# Patient Record
Sex: Male | Born: 1995 | Race: White | Hispanic: No | Marital: Single | Smoking: Former smoker
Health system: Southern US, Community
[De-identification: ages and names within clinical notes are randomized; demographics above are authoritative.]

## PROBLEM LIST (undated history)

## (undated) ENCOUNTER — Emergency Department: Admission: EM | Payer: Medicaid Other

## (undated) DIAGNOSIS — M6282 Rhabdomyolysis: Secondary | ICD-10-CM

## (undated) DIAGNOSIS — I319 Disease of pericardium, unspecified: Secondary | ICD-10-CM

## (undated) DIAGNOSIS — N200 Calculus of kidney: Secondary | ICD-10-CM

---
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
## 2014-06-06 ENCOUNTER — Emergency Department: Admit: 2014-06-06 | Disposition: A | Discharge status: Home / Self Care | Payer: Self-pay | Admitting: Internal Medicine

## 2014-10-19 ENCOUNTER — Encounter: Payer: Self-pay | Admitting: Emergency Medicine

## 2014-10-19 ENCOUNTER — Emergency Department
Admission: EM | Admit: 2014-10-19 | Discharge: 2014-10-19 | Disposition: A | Discharge status: Home / Self Care | Payer: Self-pay | Attending: Emergency Medicine | Admitting: Emergency Medicine

## 2014-10-19 DIAGNOSIS — J029 Acute pharyngitis, unspecified: Secondary | ICD-10-CM | POA: Insufficient documentation

## 2014-10-19 DIAGNOSIS — Z720 Tobacco use: Secondary | ICD-10-CM | POA: Insufficient documentation

## 2014-10-19 LAB — POCT RAPID STREP A: Streptococcus, Group A Screen (Direct): NEGATIVE

## 2014-10-19 MED ORDER — AMOXICILLIN 500 MG PO TABS: 500.0000 mg | ORAL_TABLET | Freq: Three times a day (TID) | ORAL | Status: DC

## 2014-10-19 NOTE — ED Provider Notes (Signed)
CSN: [id number]     Arrival date & time 10/19/14  1340 History   First MD Initiated Contact with Patient 10/19/14 1407     Chief Complaint  Patient presents with  . Sore Throat    HPI Comments: 19 year old male presents today complaining of sore throat for the past 3-4 days. He has also had some nasal congestion and cough. Subjective fevers associated. Taking over the counter medications without relief.   Patient is a 19 y.o. male presenting with pharyngitis. The history is provided by the patient.  Sore Throat This is a new problem. The current episode started in the past 7 days. The problem occurs constantly. The problem has been gradually worsening. Associated symptoms include arthralgias, coughing, a fever, a sore throat and swollen glands. Pertinent negatives include no rash. The symptoms are aggravated by drinking and eating. He has tried acetaminophen and NSAIDs for the symptoms.    History reviewed. No pertinent past medical history. History reviewed. No pertinent past surgical history. No family history on file. Social History  Substance Use Topics  . Smoking status: Current Every Day Smoker  . Smokeless tobacco: None  . Alcohol Use: Yes     Comment: occassional    Review of Systems  Constitutional: Positive for fever.  HENT: Positive for sore throat.   Respiratory: Positive for cough.   Musculoskeletal: Positive for arthralgias.  Skin: Negative for rash.  All other systems reviewed and are negative.     Allergies  Review of patient's allergies indicates no known allergies.  Home Medications   Prior to Admission medications   Medication Sig Start Date End Date Taking? Authorizing Provider  amoxicillin (AMOXIL) 500 MG tablet Take 1 tablet (500 mg total) by mouth 3 (three) times daily. 10/19/14   Wilber Oliphant V, PA-C   BP 141/70 mmHg  Pulse 91  Temp(Src) 98.2 F (36.8 C) (Oral)  Resp 18  Ht  (1.753 m)  Wt 140 lb (63.504 kg)  BMI 20.67 kg/m2  SpO2  97% Physical Exam  Constitutional: He is oriented to person, place, and time. Vital signs are normal. He appears well-developed and well-nourished. He is active.  Non-toxic appearance. He does not have a sickly appearance. He does not appear ill.  HENT:  Head: Normocephalic and atraumatic.  Right Ear: Tympanic membrane and external ear normal.  Left Ear: Tympanic membrane and external ear normal.  Nose: Rhinorrhea present.  Mouth/Throat: Uvula is midline and mucous membranes are normal. Oropharyngeal exudate, posterior oropharyngeal edema and posterior oropharyngeal erythema present. No tonsillar abscesses.  Eyes: Conjunctivae and EOM are normal. Pupils are equal, round, and reactive to light.  Neck: Normal range of motion. Neck supple.  Cardiovascular: Normal rate, regular rhythm, normal heart sounds and intact distal pulses.   Pulmonary/Chest: Effort normal and breath sounds normal. No respiratory distress. He has no wheezes. He has no rales.  Musculoskeletal: Normal range of motion.  Lymphadenopathy:    He has no cervical adenopathy.  Neurological: He is alert and oriented to person, place, and time.  Skin: Skin is warm and dry.  Psychiatric: He has a normal mood and affect. His behavior is normal. Judgment and thought content normal.  Nursing note and vitals reviewed.   ED Course  Procedures (including critical care time) Labs Review Labs Reviewed  CULTURE, GROUP A STREP ([facility] ONLY)  POCT RAPID STREP A    Imaging Review No results found. I have personally reviewed and evaluated these images and lab results as  part of my medical decision-making.   EKG Interpretation None      MDM  Pt with negative strep but enlarged tonsils and pus on tonsils. Strep culture pending. Continue symptomatic care at home. Cover with Amoxicillin for infection.  Final diagnoses:  Pharyngitis        Luvenia Redden, PA-C 10/19/14 1533  Darien Ramus, MD 10/19/14 [phone]

## 2014-10-19 NOTE — ED Notes (Signed)
Sore throat for about 3-4 days . Some cough also noted yesterday to have some pain/tenderness to left side of face and ear

## 2014-10-21 LAB — CULTURE, GROUP A STREP (THRC)

## 2019-04-22 ENCOUNTER — Emergency Department (HOSPITAL_COMMUNITY)
Admission: EM | Admit: 2019-04-22 | Discharge: 2019-04-22 | Disposition: A | Discharge status: Home / Self Care | Payer: Self-pay | Attending: Emergency Medicine | Admitting: Emergency Medicine

## 2019-04-22 ENCOUNTER — Encounter (HOSPITAL_COMMUNITY): Payer: Self-pay | Admitting: Emergency Medicine

## 2019-04-22 ENCOUNTER — Other Ambulatory Visit: Payer: Self-pay

## 2019-04-22 DIAGNOSIS — Y929 Unspecified place or not applicable: Secondary | ICD-10-CM | POA: Insufficient documentation

## 2019-04-22 DIAGNOSIS — K0889 Other specified disorders of teeth and supporting structures: Secondary | ICD-10-CM

## 2019-04-22 DIAGNOSIS — F17210 Nicotine dependence, cigarettes, uncomplicated: Secondary | ICD-10-CM | POA: Insufficient documentation

## 2019-04-22 DIAGNOSIS — S025XXA Fracture of tooth (traumatic), initial encounter for closed fracture: Secondary | ICD-10-CM | POA: Insufficient documentation

## 2019-04-22 DIAGNOSIS — K029 Dental caries, unspecified: Secondary | ICD-10-CM | POA: Insufficient documentation

## 2019-04-22 DIAGNOSIS — X58XXXA Exposure to other specified factors, initial encounter: Secondary | ICD-10-CM | POA: Insufficient documentation

## 2019-04-22 DIAGNOSIS — Y999 Unspecified external cause status: Secondary | ICD-10-CM | POA: Insufficient documentation

## 2019-04-22 DIAGNOSIS — Y939 Activity, unspecified: Secondary | ICD-10-CM | POA: Insufficient documentation

## 2019-04-22 MED ORDER — PENICILLIN V POTASSIUM 500 MG PO TABS
500.0000 mg | ORAL_TABLET | Freq: Four times a day (QID) | ORAL | 0 refills | Status: AC
Start: 2019-04-22 — End: 2019-04-29

## 2019-04-22 NOTE — Discharge Instructions (Signed)
Please pick up antibiotics and take as prescribed  Follow up with your dentist for further evaluation. You may need some teeth extracted as they are broken. Attached is a Designer, jewellery for other dentists in the area as well  You can take 800 mg Ibuprofen every 8 hours as well as 1,000 mg Tylenol every 8 hours for pain (I would recommend taking Tylenol and then 4 hours later taking Ibuprofen and then 4 hours later taking additional Tylenol).   Return to the ED for any worsening symptoms including swelling/pain along your gumline that may indicate a dental abscess that should be drained, inability to swallow, inability to open your jaw all the way, swelling to your face, or any other concerning symptoms

## 2019-04-22 NOTE — ED Triage Notes (Signed)
Pt here with tooth pain. The lower right and the upper left. Pt states that he he has weird taste in his mouth he thinks his teeth are infected now. States he has had a hard time getting to the dentist.

## 2019-04-22 NOTE — ED Provider Notes (Signed)
[place] EMERGENCY DEPARTMENT Provider Note   CSN: [id number] Arrival date & time: 04/22/19  1051     History Chief Complaint  Patient presents with  . Dental Pain    Roberto Stanton is a 24 y.o. male who presents to the ED today complaining of gradual onset, constant, throbbing, dental pain to right lower and left upper area x 3-4 days.  States he is unsure if he ate anything hard that could have broken his teeth however he states he feels like his teeth are broken and sharp and scratching his cheek.  He has been taking 1000 mg Tylenol every 6 hours with mild relief.  Patient states he does have a dentist however has been busy with work and unable to schedule an appointment.  He is planning to call his dentist today to schedule an appointment however states that the pain has been so severe prompting him to come to the ED today.  He also states he tastes a weird taste in his mouth.  He denies any facial swelling, fevers, chills, difficulty swallowing, any other associated symptoms.   The history is provided by the patient and medical records.       History reviewed. No pertinent past medical history.  There are no problems to display for this patient.   History reviewed. No pertinent surgical history.     No family history on file.  Social History   Tobacco Use  . Smoking status: Current Every Day Smoker  Substance Use Topics  . Alcohol use: Yes    Comment: occassional  . Drug use: Not on file    Home Medications Prior to Admission medications   Medication Sig Start Date End Date Taking? Authorizing Provider  amoxicillin (AMOXIL) 500 MG tablet Take 1 tablet (500 mg total) by mouth 3 (three) times daily. 10/19/14   Harvest Dark, PA-C  penicillin v potassium (VEETID) 500 MG tablet Take 1 tablet (500 mg total) by mouth 4 (four) times daily for 7 days. 04/22/19 04/29/19  Eustaquio Maize, PA-C    Allergies    Patient has no known allergies.  Review  of Systems   Review of Systems  Constitutional: Negative for chills and fever.  HENT: Positive for dental problem. Negative for drooling, ear pain, facial swelling and sore throat.     Physical Exam Updated Vital Signs BP 130/79 (BP Location: Right Arm)   Pulse 81   Temp 98.1 F (36.7 C) (Oral)   Resp 16   SpO2 100%   Physical Exam Vitals and nursing note reviewed.  Constitutional:      Appearance: He is not ill-appearing.  HENT:     Head: Normocephalic and atraumatic.     Mouth/Throat:      Comments: See image above for descriptor of dentition. No definite abscess, no evidence of ludwig's.  Oropharynx clear and moist, without uvular swelling or deviation, no trismus or drooling, no tonsillar swelling or erythema, no exudates.   Eyes:     Conjunctiva/sclera: Conjunctivae normal.  Cardiovascular:     Rate and Rhythm: Normal rate and regular rhythm.     Pulses: Normal pulses.     Heart sounds: No murmur.  Pulmonary:     Effort: Pulmonary effort is normal.     Breath sounds: Normal breath sounds. No wheezing, rhonchi or rales.  Skin:    General: Skin is warm and dry.     Coloration: Skin is not jaundiced.  Neurological:  Mental Status: He is alert.     ED Results / Procedures / Treatments   Labs (all labs ordered are listed, but only abnormal results are displayed) Labs Reviewed - No data to display  EKG None  Radiology No results found.  Procedures Procedures (including critical care time)  Medications Ordered in ED Medications - No data to display  ED Course  I have reviewed the triage vital signs and the nursing notes.  Pertinent labs & imaging results that were available during my care of the patient were reviewed by me and considered in my medical decision making (see chart for details).  24 year old male who presents the ED today complaining of dental pain to right lower and left upper for the past 3 to 4 days.  Patient does have several dental  caries as well as fractured teeth.  No obvious dental abscess to be drained today.  No signs of Ludwig's angina.  On arrival to the ED patient is afebrile, nontachycardic and nontachypneic.  Nontoxic-appearing.  Main gel applied to dentition and patient discharged home with antibiotic.  Advised to follow-up with his dentist, dental resource given for patient as well.  Strict return precautions have been discussed with patient.  Ibuprofen and Tylenol as needed for pain.  Patient stable for discharge at this time.   This note was prepared using Dragon voice recognition software and may include unintentional dictation errors due to the inherent limitations of voice recognition software.     MDM Rules/Calculators/A&P                       Final Clinical Impression(s) / ED Diagnoses Final diagnoses:  Pain, dental  Closed fracture of tooth, initial encounter    Rx / DC Orders ED Discharge Orders         Ordered    penicillin v potassium (VEETID) 500 MG tablet  4 times daily     04/22/19 1132           Discharge Instructions     Please pick up antibiotics and take as prescribed  Follow up with your dentist for further evaluation. You may need some teeth extracted as they are broken. Attached is a Designer, jewellery for other dentists in the area as well  You can take 800 mg Ibuprofen every 8 hours as well as 1,000 mg Tylenol every 8 hours for pain (I would recommend taking Tylenol and then 4 hours later taking Ibuprofen and then 4 hours later taking additional Tylenol).   Return to the ED for any worsening symptoms including swelling/pain along your gumline that may indicate a dental abscess that should be drained, inability to swallow, inability to open your jaw all the way, swelling to your face, or any other concerning symptoms       Tanda Rockers, PA-C 04/22/19 1139    Vanetta Mulders, MD 04/26/19 [phone]

## 2019-05-21 DIAGNOSIS — I319 Disease of pericardium, unspecified: Secondary | ICD-10-CM

## 2019-05-21 DIAGNOSIS — I517 Cardiomegaly: Secondary | ICD-10-CM

## 2019-05-21 DIAGNOSIS — R079 Chest pain, unspecified: Secondary | ICD-10-CM

## 2019-05-21 DIAGNOSIS — R0600 Dyspnea, unspecified: Secondary | ICD-10-CM

## 2019-05-21 DIAGNOSIS — K047 Periapical abscess without sinus: Secondary | ICD-10-CM

## 2019-05-21 DIAGNOSIS — M6282 Rhabdomyolysis: Secondary | ICD-10-CM

## 2019-05-22 ENCOUNTER — Encounter (HOSPITAL_COMMUNITY): Payer: Self-pay

## 2019-05-22 ENCOUNTER — Other Ambulatory Visit: Payer: Self-pay

## 2019-05-22 ENCOUNTER — Emergency Department (HOSPITAL_COMMUNITY)
Admission: EM | Admit: 2019-05-22 | Discharge: 2019-05-23 | Disposition: A | Discharge status: Home / Self Care | Payer: Self-pay | Attending: Emergency Medicine | Admitting: Emergency Medicine

## 2019-05-22 ENCOUNTER — Emergency Department (HOSPITAL_COMMUNITY): Payer: Self-pay

## 2019-05-22 DIAGNOSIS — R5383 Other fatigue: Secondary | ICD-10-CM | POA: Insufficient documentation

## 2019-05-22 DIAGNOSIS — K047 Periapical abscess without sinus: Secondary | ICD-10-CM | POA: Insufficient documentation

## 2019-05-22 DIAGNOSIS — R002 Palpitations: Secondary | ICD-10-CM | POA: Insufficient documentation

## 2019-05-22 DIAGNOSIS — Z20822 Contact with and (suspected) exposure to covid-19: Secondary | ICD-10-CM | POA: Insufficient documentation

## 2019-05-22 DIAGNOSIS — R072 Precordial pain: Secondary | ICD-10-CM | POA: Insufficient documentation

## 2019-05-22 DIAGNOSIS — R52 Pain, unspecified: Secondary | ICD-10-CM

## 2019-05-22 DIAGNOSIS — F17210 Nicotine dependence, cigarettes, uncomplicated: Secondary | ICD-10-CM | POA: Insufficient documentation

## 2019-05-22 DIAGNOSIS — R0602 Shortness of breath: Secondary | ICD-10-CM | POA: Insufficient documentation

## 2019-05-22 DIAGNOSIS — R079 Chest pain, unspecified: Secondary | ICD-10-CM

## 2019-05-22 DIAGNOSIS — R5381 Other malaise: Secondary | ICD-10-CM

## 2019-05-22 DIAGNOSIS — R61 Generalized hyperhidrosis: Secondary | ICD-10-CM | POA: Insufficient documentation

## 2019-05-22 DIAGNOSIS — K0889 Other specified disorders of teeth and supporting structures: Secondary | ICD-10-CM | POA: Insufficient documentation

## 2019-05-22 DIAGNOSIS — M7918 Myalgia, other site: Secondary | ICD-10-CM | POA: Insufficient documentation

## 2019-05-22 HISTORY — DX: Calculus of kidney: N20.0

## 2019-05-22 HISTORY — DX: Disease of pericardium, unspecified: I31.9

## 2019-05-22 LAB — RESPIRATORY PANEL BY RT PCR (FLU A&B, COVID)
Influenza A by PCR: NEGATIVE
Influenza B by PCR: NEGATIVE
SARS Coronavirus 2 by RT PCR: NEGATIVE

## 2019-05-22 LAB — COMPREHENSIVE METABOLIC PANEL
ALT: 43 U/L (ref 0–44)
AST: 35 U/L (ref 15–41)
Albumin: 4.2 g/dL (ref 3.5–5.0)
Alkaline Phosphatase: 84 U/L (ref 38–126)
Anion gap: 11 (ref 5–15)
BUN: 17 mg/dL (ref 6–20)
CO2: 26 mmol/L (ref 22–32)
Calcium: 9.5 mg/dL (ref 8.9–10.3)
Chloride: 102 mmol/L (ref 98–111)
Creatinine, Ser: 0.68 mg/dL (ref 0.61–1.24)
GFR calc Af Amer: >60 mL/min (ref >60)
GFR calc non Af Amer: >60 mL/min (ref >60)
Glucose, Bld: 75 mg/dL (ref 70–99)
Potassium: 3.3 mmol/L — ABNORMAL LOW (ref 3.5–5.1)
Sodium: 139 mmol/L (ref 135–145)
Total Bilirubin: 0.5 mg/dL (ref 0.3–1.2)
Total Protein: 7.6 g/dL (ref 6.5–8.1)

## 2019-05-22 LAB — CBC WITH DIFFERENTIAL/PLATELET
Abs Immature Granulocytes: 0.02 10*3/uL (ref 0.00–0.07)
Basophils Absolute: 0 10*3/uL (ref 0.0–0.1)
Basophils Relative: 1 %
Eosinophils Absolute: 0.1 10*3/uL (ref 0.0–0.5)
Eosinophils Relative: 2 %
HCT: 43 % (ref 39.0–52.0)
Hemoglobin: 14.8 g/dL (ref 13.0–17.0)
Immature Granulocytes: 0 %
Lymphocytes Relative: 28 %
Lymphs Abs: 2 10*3/uL (ref 0.7–4.0)
MCH: 32.6 pg (ref 26.0–34.0)
MCHC: 34.4 g/dL (ref 30.0–36.0)
MCV: 94.7 fL (ref 80.0–100.0)
Monocytes Absolute: 0.8 10*3/uL (ref 0.1–1.0)
Monocytes Relative: 11 %
Neutro Abs: 4.1 10*3/uL (ref 1.7–7.7)
Neutrophils Relative %: 58 %
Platelets: 219 10*3/uL (ref 150–400)
RBC: 4.54 MIL/uL (ref 4.22–5.81)
RDW: 12.7 % (ref 11.5–15.5)
WBC: 7 10*3/uL (ref 4.0–10.5)
nRBC: 0 % (ref 0.0–0.2)

## 2019-05-22 LAB — TROPONIN I (HIGH SENSITIVITY): Troponin I (High Sensitivity): 4 ng/L (ref <18)

## 2019-05-22 LAB — CK: Total CK: 918 U/L — ABNORMAL HIGH (ref 49–397)

## 2019-05-22 LAB — PROTIME-INR
INR: 1.1 (ref 0.8–1.2)
Prothrombin Time: 14.3 seconds (ref 11.4–15.2)

## 2019-05-22 LAB — LACTIC ACID, PLASMA: Lactic Acid, Venous: 0.9 mmol/L (ref 0.5–1.9)

## 2019-05-22 LAB — LIPASE, BLOOD: Lipase: 27 U/L (ref 11–51)

## 2019-05-22 LAB — D-DIMER, QUANTITATIVE: D-Dimer, Quant: <0.27 ug/mL-FEU (ref 0.00–0.50)

## 2019-05-22 LAB — MAGNESIUM: Magnesium: 2 mg/dL (ref 1.7–2.4)

## 2019-05-22 MED ORDER — SODIUM CHLORIDE 0.9 % IV BOLUS
500.0000 mL | Freq: Once | INTRAVENOUS | Status: AC
  Administered 2019-05-22: 500 mL via INTRAVENOUS

## 2019-05-22 NOTE — ED Provider Notes (Signed)
Patient being evaluated for chest pain, recently diagnosed with pericarditis and rhabdomyolysis.  D-dimer is negative and chest x-ray is normal, CBC is normal some labs still pending.  1:56 AM CK is still mildly elevated, but not high enough to warrant readmission.  Lactic acid and CRP are normal as is troponin.  I went back to reevaluate him and on careful auscultation, I cannot hear any rub.  At this point, no indication for hospital admission.  He is discharged with instructions to continue his medications I would prescribe that his recent hospitalization is given a prescription for tramadol for pain and is referred to cardiology for further outpatient work-up.  Return precautions discussed.   Dione Booze, MD 05/23/19 0157

## 2019-05-22 NOTE — ED Triage Notes (Signed)
Pt BIB [facility] for eval of worsening SOB and chest pain. Pt was recently d/c'd from [facility] for dx of pericarditis r/t infected tooth. Pt reports that he was sitting on his couch and noted acute onset of SOB prompting him to call 911.

## 2019-05-22 NOTE — ED Provider Notes (Signed)
[facility] EMERGENCY DEPARTMENT Provider Note   CSN: [id number] Arrival date & time: 05/22/19  2036     History Chief Complaint  Patient presents with  . Chest Pain    Roberto Stanton is a 24 y.o. male.  The history is provided by the patient and medical records. No language interpreter was used.  Chest Pain Pain location:  L chest and substernal area Pain quality: aching, crushing, dull, pressure, sharp and tightness   Pain radiates to:  Does not radiate Pain severity:  Severe Onset quality:  Gradual Duration:  2 days Timing:  Constant Progression:  Waxing and waning Chronicity:  New Context: breathing   Relieved by:  Nothing Worsened by:  Deep breathing and exertion Ineffective treatments:  None tried Associated symptoms: diaphoresis, fatigue, palpitations and shortness of breath   Associated symptoms: no abdominal pain, no back pain, no cough, no dizziness, no fever (chilles present), no headache, no lower extremity edema, no nausea, no near-syncope, no numbness, no vomiting and no weakness   Risk factors: male sex and smoking        Past Medical History:  Diagnosis Date  . Kidney stones   . Pericarditis     There are no problems to display for this patient.   History reviewed. No pertinent surgical history.     History reviewed. No pertinent family history.  Social History   Tobacco Use  . Smoking status: Current Every Day Smoker  . Smokeless tobacco: Never Used  Substance Use Topics  . Alcohol use: Yes    Comment: occasional  . Drug use: Yes    Types: Marijuana    Home Medications Prior to Admission medications   Medication Sig Start Date End Date Taking? Authorizing Provider  amoxicillin (AMOXIL) 500 MG tablet Take 1 tablet (500 mg total) by mouth 3 (three) times daily. 10/19/14   Christella Scheuermann, PA-C    Allergies    Patient has no known allergies.  Review of Systems   Review of Systems  Constitutional: Positive for  chills, diaphoresis and fatigue. Negative for fever (chilles present).  HENT: Positive for dental problem (L upper dental infection). Negative for congestion.   Eyes: Negative for visual disturbance.  Respiratory: Positive for chest tightness and shortness of breath. Negative for cough and choking.   Cardiovascular: Positive for chest pain and palpitations. Negative for leg swelling and near-syncope.  Gastrointestinal: Negative for abdominal pain, constipation, diarrhea, nausea and vomiting.  Genitourinary: Positive for decreased urine volume. Negative for flank pain and frequency.       Urine darker, less and smells stronger  Musculoskeletal: Negative for back pain, neck pain and neck stiffness.  Skin: Negative for rash and wound.  Neurological: Positive for light-headedness. Negative for dizziness, seizures, syncope, weakness, numbness and headaches.  Psychiatric/Behavioral: Negative for agitation and confusion.  All other systems reviewed and are negative.   Physical Exam Updated Vital Signs BP 137/89   Pulse 71   Temp 97.9 F (36.6 C) (Oral)   Resp 17   Ht 5\' 9"  (1.753 m)   Wt 63.5 kg   SpO2 98%   BMI 20.67 kg/m   Physical Exam Vitals and nursing note reviewed.  Constitutional:      General: He is not in acute distress.    Appearance: He is well-developed and normal weight. He is diaphoretic. He is not ill-appearing or toxic-appearing.  HENT:     Head: Normocephalic and atraumatic.     Mouth/Throat:  Mouth: Mucous membranes are dry.     Dentition: Abnormal dentition. Dental tenderness present.     Pharynx: Oropharynx is clear. Uvula midline. No pharyngeal swelling, oropharyngeal exudate, posterior oropharyngeal erythema or uvula swelling.     Tonsils: No tonsillar exudate.   Eyes:     Extraocular Movements: Extraocular movements intact.     Conjunctiva/sclera: Conjunctivae normal.     Pupils: Pupils are equal, round, and reactive to light.  Cardiovascular:      Rate and Rhythm: Normal rate and regular rhythm.  No extrasystoles are present.    Heart sounds: No murmur.  Pulmonary:     Effort: Pulmonary effort is normal. No tachypnea or respiratory distress.     Breath sounds: Normal breath sounds. No decreased breath sounds, wheezing, rhonchi or rales.  Chest:     Chest wall: Tenderness present.    Abdominal:     Palpations: Abdomen is soft.     Tenderness: There is no abdominal tenderness.  Musculoskeletal:        General: Normal range of motion.     Cervical back: Neck supple.     Right lower leg: No tenderness. No edema.     Left lower leg: No tenderness. No edema.  Skin:    General: Skin is warm.     Capillary Refill: Capillary refill takes less than 2 seconds.  Neurological:     General: No focal deficit present.     Mental Status: He is alert and oriented to person, place, and time.     Cranial Nerves: No cranial nerve deficit.  Psychiatric:        Mood and Affect: Mood normal.     ED Results / Procedures / Treatments   Labs (all labs ordered are listed, but only abnormal results are displayed) Labs Reviewed  CULTURE, BLOOD (ROUTINE X 2)  CULTURE, BLOOD (ROUTINE X 2)  RESPIRATORY PANEL BY RT PCR (FLU A&B, COVID)  D-DIMER, QUANTITATIVE (NOT AT [facility])  CBC WITH DIFFERENTIAL/PLATELET  COMPREHENSIVE METABOLIC PANEL  LACTIC ACID, PLASMA  LACTIC ACID, PLASMA  LIPASE, BLOOD  PROTIME-INR  CK  SEDIMENTATION RATE  MAGNESIUM  C-REACTIVE PROTEIN  TROPONIN I (HIGH SENSITIVITY)    EKG EKG Interpretation  Date/Time:  Friday May 22 2019 20:39:00 EDT Ventricular Rate:  75 PR Interval:    QRS Duration: 89 QT Interval:  375 QTC Calculation: 419 R Axis:   97 Text Interpretation: Sinus rhythm Right ventricular hypertrophy LVH by voltage Abnormal T, probable ischemia, lateral leads ST elev, probable normal early repol pattern NO prior ECG for comparison. No STEMI Confirmed by Theda Belfast ([id number]) on 05/22/2019 8:51:38  PM   Radiology DG Chest Portable 1 View  Result Date: 05/22/2019 CLINICAL DATA:  Chest pain and shortness of breath. Recent hospital admission and discharged yesterday for pericarditis/dental infection/rhabdo/dehydration. EXAM: PORTABLE CHEST 1 VIEW COMPARISON:  Radiograph 05/20/2019, chest CT yesterday. Imaging at [facility] FINDINGS: The cardiomediastinal contours are normal. The lungs are clear. Pulmonary vasculature is normal. No consolidation, pleural effusion, or pneumothorax. No acute osseous abnormalities are seen. IMPRESSION: Negative portable AP view of the chest. Electronically Signed   By: Narda Rutherford M.D.   On: 05/22/2019 22:22    Procedures Procedures (including critical care time)  Medications Ordered in ED Medications  sodium chloride 0.9 % bolus 500 mL (500 mLs Intravenous Bolus from Bag 05/22/19 2219)    ED Course  I have reviewed the triage vital signs and the nursing notes.  Pertinent labs & imaging  results that were available during my care of the patient were reviewed by me and considered in my medical decision making (see chart for details).    MDM Rules/Calculators/A&P                      KRAIG GENIS is a 24 y.o. male with a past medical history significant for prior kidney stone recently, dental abscess on amoxicillin, and recent admission and discharge yesterday for pericarditis rhabdomyolysis who presents with worsened symptoms of chest pain, shortness of breath, lightheadedness, fatigue, malaise, palpitations, and aches.  Patient reports that he has had ongoing dental issues for months but over the last week, he was having worsening dental pain.  He reports that he began having chest pain and shortness of breath over the last several days and went 2 days ago to [facility] where he was admitted for pericarditis.  He reports that he was discharged yesterday and was starting to feel better but then today started having worsening symptoms.  He  reports he is having severe chest pain that is both exertional and pleuritic.  He reports he was diaphoretic and very fatigued.  He was feeling chills and malaise.  He was having diffuse muscle aches.  He reports that he has been taking the amoxicillin as directed for the dental infection but is still having some dental pain.  He reports his urine has looked darker and is smelling stronger and he denies any constipation or diarrhea.  Denies any recent Covid exposures but reports he was not tested for Covid during his admission.  Denies any nausea or vomiting or other complaints.  No headache, neck pain, neck stiffness.  He denies a history of IV drug use.  On exam, patient is maintaining oxygen saturations on room air.  He is not tachycardic or tachypneic.  He is afebrile.  Blood pressure is not hypotensive.  Patient does appear slightly anxious and mildly diaphoretic.  His chest is tender to palpation and I do not hear a murmur.  Lungs are otherwise clear.  Abdomen is nontender.  Good pulses in all extremities with no focal neurologic deficits.  Clinically I am concerned about the patient given this confusing picture.  I am somewhat concerned about the report that he was diagnosed with pericarditis and his symptoms have been worsening.  I am also concerned that this may be related to his dental infection in which case of endocarditis would be more likely we will get chest x-ray and labs as well as a D-dimer given the pleuritic chest pain and shortness of breath.  We will check his urinalysis due to the urinary symptoms and get blood cultures with the possibility of endocarditis.  Anticipate reassessment after work-up to determine disposition although the patient does have reassuring vital signs on arrival.  Care will be transferred to oncoming team awaiting for diagnostic work-up to return.        Final Clinical Impression(s) / ED Diagnoses Final diagnoses:  Chest pain, unspecified type  Palpitations   Shortness of breath  Fatigue, unspecified type  Malaise  Body aches     Clinical Impression: 1. Chest pain, unspecified type   2. Palpitations   3. Shortness of breath   4. Fatigue, unspecified type   5. Malaise   6. Body aches     Disposition: Care will be transferred to oncoming team awaiting for diagnostic work-up to return.       This note was prepared with assistance  of Systems analyst. Occasional wrong-word or sound-a-like substitutions may have occurred due to the inherent limitations of voice recognition software.      Scout Gumbs, Gwenyth Allegra, MD 05/22/19 2337

## 2019-05-23 LAB — C-REACTIVE PROTEIN: CRP: 0.6 mg/dL (ref <1.0)

## 2019-05-23 LAB — SEDIMENTATION RATE: Sed Rate: 3 mm/hr (ref 0–16)

## 2019-05-23 LAB — TROPONIN I (HIGH SENSITIVITY): Troponin I (High Sensitivity): 4 ng/L (ref <18)

## 2019-05-23 LAB — LACTIC ACID, PLASMA: Lactic Acid, Venous: 0.7 mmol/L (ref 0.5–1.9)

## 2019-05-23 MED ORDER — TRAMADOL HCL 50 MG PO TABS: 50.0000 mg | ORAL_TABLET | Freq: Four times a day (QID) | ORAL | 0 refills | Status: DC | PRN

## 2019-05-23 NOTE — Discharge Instructions (Addendum)
Continue taking all of the medications prescribed for you.  Return if pain is getting worse, you start running a fever, or start having difficulty breathing.

## 2019-05-25 ENCOUNTER — Encounter: Payer: Self-pay | Admitting: Emergency Medicine

## 2019-05-25 ENCOUNTER — Emergency Department: Payer: Self-pay

## 2019-05-25 ENCOUNTER — Emergency Department
Admission: EM | Admit: 2019-05-25 | Discharge: 2019-05-25 | Disposition: A | Discharge status: Home / Self Care | Payer: Self-pay | Attending: Emergency Medicine | Admitting: Emergency Medicine

## 2019-05-25 ENCOUNTER — Other Ambulatory Visit: Payer: Self-pay

## 2019-05-25 DIAGNOSIS — R0602 Shortness of breath: Secondary | ICD-10-CM | POA: Insufficient documentation

## 2019-05-25 DIAGNOSIS — R0789 Other chest pain: Secondary | ICD-10-CM | POA: Insufficient documentation

## 2019-05-25 DIAGNOSIS — K0889 Other specified disorders of teeth and supporting structures: Secondary | ICD-10-CM | POA: Insufficient documentation

## 2019-05-25 DIAGNOSIS — R222 Localized swelling, mass and lump, trunk: Secondary | ICD-10-CM | POA: Insufficient documentation

## 2019-05-25 DIAGNOSIS — Z5321 Procedure and treatment not carried out due to patient leaving prior to being seen by health care provider: Secondary | ICD-10-CM | POA: Insufficient documentation

## 2019-05-25 LAB — CBC
HCT: 46.1 % (ref 39.0–52.0)
Hemoglobin: 16.1 g/dL (ref 13.0–17.0)
MCH: 32.9 pg (ref 26.0–34.0)
MCHC: 34.9 g/dL (ref 30.0–36.0)
MCV: 94.1 fL (ref 80.0–100.0)
Platelets: 160 10*3/uL (ref 150–400)
RBC: 4.9 MIL/uL (ref 4.22–5.81)
RDW: 12.7 % (ref 11.5–15.5)
WBC: 6.7 10*3/uL (ref 4.0–10.5)
nRBC: 0 % (ref 0.0–0.2)

## 2019-05-25 LAB — COMPREHENSIVE METABOLIC PANEL WITH GFR
ALT: 28 U/L (ref 0–44)
AST: 25 U/L (ref 15–41)
Albumin: 4.2 g/dL (ref 3.5–5.0)
Alkaline Phosphatase: 78 U/L (ref 38–126)
Anion gap: 7 (ref 5–15)
BUN: 11 mg/dL (ref 6–20)
CO2: 27 mmol/L (ref 22–32)
Calcium: 8.9 mg/dL (ref 8.9–10.3)
Chloride: 106 mmol/L (ref 98–111)
Creatinine, Ser: 0.69 mg/dL (ref 0.61–1.24)
GFR calc Af Amer: >60 mL/min (ref >60)
GFR calc non Af Amer: >60 mL/min (ref >60)
Glucose, Bld: 95 mg/dL (ref 70–99)
Potassium: 3.8 mmol/L (ref 3.5–5.1)
Sodium: 140 mmol/L (ref 135–145)
Total Bilirubin: 0.6 mg/dL (ref 0.3–1.2)
Total Protein: 7.8 g/dL (ref 6.5–8.1)

## 2019-05-25 LAB — TROPONIN I (HIGH SENSITIVITY): Troponin I (High Sensitivity): 3 ng/L (ref <18)

## 2019-05-25 NOTE — ED Triage Notes (Signed)
Pt here with c/o chest pain,. States he was recently seen with similar as well as dental pain and has a home made tattoo from a month ago that is still healing.   #20g LFA, NS.Marland Kitchen

## 2019-05-25 NOTE — ED Triage Notes (Signed)
Pt here today for left sided swelling over chest wall and SHOB.  Dx with pericarditis Wednesday or Thursday last week.  Was seen at [facility] and put on abx for dental abscess.  Pt reports feels like getting worse. No fevers.  Supposed to FU tomorrow with cardiology

## 2019-05-25 NOTE — ED Notes (Signed)
Pt asking how long it will be,. Advised pt there is no way to know. Pt states he is going to call mom to pick him up. Advised pt if he though his emergency had alveated and he was safe to go home it was his decision.

## 2019-05-25 NOTE — ED Notes (Signed)
Went to pt to draw repeat troponin, pt states he wants to leave due to ride issues.  He states he has a dr appt in the morning.  Pt advised if sx worsen to return to ED ASAP.  IV removed intact.

## 2019-05-26 ENCOUNTER — Ambulatory Visit (INDEPENDENT_AMBULATORY_CARE_PROVIDER_SITE_OTHER): Payer: Self-pay | Admitting: Cardiology

## 2019-05-26 ENCOUNTER — Encounter: Payer: Self-pay | Admitting: Cardiology

## 2019-05-26 ENCOUNTER — Encounter: Payer: Self-pay | Admitting: *Deleted

## 2019-05-26 VITALS — BP 120/72 | HR 87 | Ht 69.0 in | Wt 136.2 lb

## 2019-05-26 DIAGNOSIS — R0602 Shortness of breath: Secondary | ICD-10-CM

## 2019-05-26 DIAGNOSIS — R079 Chest pain, unspecified: Secondary | ICD-10-CM

## 2019-05-26 NOTE — Progress Notes (Signed)
Cardiology Office Note:    Date:  05/26/2019   ID:  Roberto Stanton, DOB 1995-05-03, MRN [id number]  PCP:  Patient, No Pcp Per  Cardiologist:  Debbe Odea, MD  Electrophysiologist:  None   Referring MD: Dione Booze, MD   Chief Complaint  Patient presents with   New Patient (Initial Visit)    Pt admitted to ED (05/22/19) for SOB after leaving ED pt states Sunday (05/24/19) racking leaves felt pressure and some swelling on the side of chest. Meds verbally reviewed w/ pt.   Roberto Stanton is a 24 y.o. male who is being seen today for the evaluation of chest pain at the request of Dione Booze, MD.   History of Present Illness:    Roberto Stanton is a 24 y.o. male with no significant past medical history who presents due to chest pain.  Patient states having symptoms of chest pain which he describes as sharp over the past 2 weeks.  Symptoms are not related with exertion, reproducible with palpation of his left chest and also movement of his arms.  He denies trauma.  He also endorses shortness of breath with exertion over the past 2 weeks.  He denies any history of heart disease.  He was seen in the ED at [facility] where he was diagnosed with pericarditis.  He was subsequently discharged on antibiotics/Augmentin.  He states symptoms have persisted since.  He has dental infection for which he takes antibiotics.  He has been dealing with a dental infection for roughly a year now.  He has plans to have some tooth extraction.  Patient was seen in the ED on 05/22/2019 with chest discomfort.  ED notes indicate chest discomfort was tender to palpation.  EKG, troponins did not show any evidence for ischemia.  Past Medical History:  Diagnosis Date   Kidney stones    Pericarditis     History reviewed. No pertinent surgical history.  Current Medications: Current Meds  Medication Sig   acetaminophen (TYLENOL) 500 MG tablet Take 500 mg by mouth every 6 (six) hours as needed for mild  pain.   amoxicillin (AMOXIL) 500 MG tablet Take 1 tablet (500 mg total) by mouth 3 (three) times daily.   ibuprofen (ADVIL) 200 MG tablet Take 200 mg by mouth every 6 (six) hours as needed for moderate pain.   Multiple Vitamin (MULTIVITAMIN) tablet Take 1 tablet by mouth daily. Taking 1 tablet daily     Allergies:   Patient has no known allergies.   Social History   Socioeconomic History   Marital status: Single    Spouse name: Not on file   Number of children: Not on file   Years of education: Not on file   Highest education level: Not on file  Occupational History   Not on file  Tobacco Use   Smoking status: Current Every Day Smoker   Smokeless tobacco: Never Used  Substance and Sexual Activity   Alcohol use: Yes    Comment: occasional   Drug use: Yes    Types: Marijuana   Sexual activity: Not on file  Other Topics Concern   Not on file  Social History Narrative   Not on file   Social Determinants of Health   Financial Resource Strain:    Difficulty of Paying Living Expenses:   Food Insecurity:    Worried About Running Out of Food in the Last Year:    Merchant navy officer of Food in the Last Year:   Transportation  Needs:    Lack of Transportation (Medical):    Lack of Transportation (Non-Medical):   Physical Activity:    Days of Exercise per Week:    Minutes of Exercise per Session:   Stress:    Feeling of Stress :   Social Connections:    Frequency of Communication with Friends and Family:    Frequency of Social Gatherings with Friends and Family:    Attends Religious Services:    Active Member of Clubs or Organizations:    Attends Archivist Meetings:    Marital Status:      Family History: The patient's family history is not on file.  ROS:   Please see the history of present illness.     All other systems reviewed and are negative.  EKGs/Labs/Other Studies Reviewed:    The following studies were reviewed today:   EKG:   EKG is  ordered today.  The ekg ordered today demonstrates normal sinus rhythm.  Normal ECG.  Recent Labs: 05/22/2019: Magnesium 2.0 05/25/2019: ALT 28; BUN 11; Creatinine, Ser 0.69; Hemoglobin 16.1; Platelets 160; Potassium 3.8; Sodium 140  Recent Lipid Panel No results found for: CHOL, TRIG, HDL, CHOLHDL, VLDL, LDLCALC, LDLDIRECT  Physical Exam:    VS:  BP 120/72 (BP Location: Right Arm, Patient Position: Sitting, Cuff Size: Normal)    Pulse 87    Ht 5\' 9"  (1.753 m)    Wt 136 lb 4 oz (61.8 kg)    SpO2 98%    BMI 20.12 kg/m     Wt Readings from Last 3 Encounters:  05/26/19 136 lb 4 oz (61.8 kg)  05/22/19 140 lb (63.5 kg)  10/19/14 140 lb (63.5 kg)     GEN:  Well nourished, well developed in no acute distress HEENT: Normal NECK: No JVD; No carotid bruits LYMPHATICS: No lymphadenopathy CARDIAC: RRR, no murmurs, rubs, gallops RESPIRATORY:  Clear to auscultation without rales, wheezing or rhonchi  ABDOMEN: Soft, non-tender, non-distended MUSCULOSKELETAL:  No edema; midsternal and left-sided chest wall tender with palpation SKIN: Warm and dry NEUROLOGIC:  Alert and oriented x 3 PSYCHIATRIC:  Normal affect   ASSESSMENT:    1. Chest pain of uncertain etiology   2. SOB (shortness of breath)    PLAN:    In order of problems listed above:  1. Patient presenting with atypical chest pain.  Chest pain is reproducible with palpation.  He has no cardiac risk factors.  He is very young.  His symptoms of chest pain is musculoskeletal in origin due to reproducible symptoms with palpation.  Patient reassured.  Patient advised to find a primary care provider for follow-up.  OTC ibuprofen as needed pain recommended. 2. He also endorses dyspnea on exertion.  We will get an echocardiogram due to prior diagnosis of pericarditis to evaluate for any effusion, structural abnormalities.  Follow-up after echocardiogram.   This note was generated in part or whole with voice recognition software. Voice  recognition is usually quite accurate but there are transcription errors that can and very often do occur. I apologize for any typographical errors that were not detected and corrected.  Medication Adjustments/Labs and Tests Ordered: Current medicines are reviewed at length with the patient today.  Concerns regarding medicines are outlined above.  Orders Placed This Encounter  Procedures   EKG 12-Lead   ECHOCARDIOGRAM COMPLETE   No orders of the defined types were placed in this encounter.   Patient Instructions  Medication Instructions:  Your physician recommends that you continue  on your current medications as directed. Please refer to the Current Medication list given to you today.  *If you need a refill on your cardiac medications before your next appointment, please call your pharmacy*  Lab Work: none If you have labs (blood work) drawn today and your tests are completely normal, you will receive your results only by:  MyChart Message (if you have MyChart) OR  A paper copy in the mail If you have any lab test that is abnormal or we need to change your treatment, we will call you to review the results.  Testing/Procedures: Your physician has requested that you have an echocardiogram. Echocardiography is a painless test that uses sound waves to create images of your heart. It provides your doctor with information about the size and shape of your heart and how well your hearts chambers and valves are working. This procedure takes approximately one hour. There are no restrictions for this procedure. You may get an IV, if needed, to receive an ultrasound enhancing agent through to better visualize your heart.   Follow-Up: At [facility], you and your health needs are our priority.  As part of our continuing mission to provide you with exceptional heart care, we have created designated Provider Care Teams.  These Care Teams include your primary Cardiologist (physician) and  Advanced Practice Providers (APPs -  Physician Assistants and Nurse Practitioners) who all work together to provide you with the care you need, when you need it.  We recommend signing up for the patient portal called "MyChart".  Sign up information is provided on this After Visit Summary.  MyChart is used to connect with patients for Virtual Visits (Telemedicine).  Patients are able to view lab/test results, encounter notes, upcoming appointments, etc.  Non-urgent messages can be sent to your provider as well.   To learn more about what you can do with MyChart, go to [email or web address].    Your next appointment:   After echo completed.  The format for your next appointment:   In Person  Provider:   Debbe Odea, MD    Echocardiogram An echocardiogram is a procedure that uses painless sound waves (ultrasound) to produce an image of the heart. Images from an echocardiogram can provide important information about:  Signs of coronary artery disease (CAD).  Aneurysm detection. An aneurysm is a weak or damaged part of an artery wall that bulges out from the normal force of blood pumping through the body.  Heart size and shape. Changes in the size or shape of the heart can be associated with certain conditions, including heart failure, aneurysm, and CAD.  Heart muscle function.  Heart valve function.  Signs of a past heart attack.  Fluid buildup around the heart.  Thickening of the heart muscle.  A tumor or infectious growth around the heart valves. Tell a health care provider about:  Any allergies you have.  All medicines you are taking, including vitamins, herbs, eye drops, creams, and over-the-counter medicines.  Any blood disorders you have.  Any surgeries you have had.  Any medical conditions you have.  Whether you are pregnant or may be pregnant. What are the risks? Generally, this is a safe procedure. However, problems may occur, including:  Allergic  reaction to dye (contrast) that may be used during the procedure. What happens before the procedure? No specific preparation is needed. You may eat and drink normally. What happens during the procedure?   An IV tube may be inserted into one of your  veins.  You may receive contrast through this tube. A contrast is an injection that improves the quality of the pictures from your heart.  A gel will be applied to your chest.  A wand-like tool (transducer) will be moved over your chest. The gel will help to transmit the sound waves from the transducer.  The sound waves will harmlessly bounce off of your heart to allow the heart images to be captured in real-time motion. The images will be recorded on a computer. The procedure may vary among health care providers and hospitals. What happens after the procedure?  You may return to your normal, everyday life, including diet, activities, and medicines, unless your health care provider tells you not to do that. Summary  An echocardiogram is a procedure that uses painless sound waves (ultrasound) to produce an image of the heart.  Images from an echocardiogram can provide important information about the size and shape of your heart, heart muscle function, heart valve function, and fluid buildup around your heart.  You do not need to do anything to prepare before this procedure. You may eat and drink normally.  After the echocardiogram is completed, you may return to your normal, everyday life, unless your health care provider tells you not to do that. This information is not intended to replace advice given to you by your health care provider. Make sure you discuss any questions you have with your health care provider. Document Revised: 05/29/2018 Document Reviewed: 03/10/2016 Elsevier Patient Education  2020 [employer].     Signed, Debbe Odea, MD  05/26/2019 11:08 AM    [facility]

## 2019-05-26 NOTE — Patient Instructions (Signed)
Medication Instructions:  Your physician recommends that you continue on your current medications as directed. Please refer to the Current Medication list given to you today.  *If you need a refill on your cardiac medications before your next appointment, please call your pharmacy*  Lab Work: none If you have labs (blood work) drawn today and your tests are completely normal, you will receive your results only by: Marland Kitchen MyChart Message (if you have MyChart) OR . A paper copy in the mail If you have any lab test that is abnormal or we need to change your treatment, we will call you to review the results.  Testing/Procedures: Your physician has requested that you have an echocardiogram. Echocardiography is a painless test that uses sound waves to create images of your heart. It provides your doctor with information about the size and shape of your heart and how well your heart's chambers and valves are working. This procedure takes approximately one hour. There are no restrictions for this procedure. You may get an IV, if needed, to receive an ultrasound enhancing agent through to better visualize your heart.   Follow-Up: At [facility], you and your health needs are our priority.  As part of our continuing mission to provide you with exceptional heart care, we have created designated Provider Care Teams.  These Care Teams include your primary Cardiologist (physician) and Advanced Practice Providers (APPs -  Physician Assistants and Nurse Practitioners) who all work together to provide you with the care you need, when you need it.  We recommend signing up for the patient portal called "MyChart".  Sign up information is provided on this After Visit Summary.  MyChart is used to connect with patients for Virtual Visits (Telemedicine).  Patients are able to view lab/test results, encounter notes, upcoming appointments, etc.  Non-urgent messages can be sent to your provider as well.   To learn more about  what you can do with MyChart, go to [email or web address].    Your next appointment:   After echo completed.  The format for your next appointment:   In Person  Provider:   Debbe Odea, MD    Echocardiogram An echocardiogram is a procedure that uses painless sound waves (ultrasound) to produce an image of the heart. Images from an echocardiogram can provide important information about:  Signs of coronary artery disease (CAD).  Aneurysm detection. An aneurysm is a weak or damaged part of an artery wall that bulges out from the normal force of blood pumping through the body.  Heart size and shape. Changes in the size or shape of the heart can be associated with certain conditions, including heart failure, aneurysm, and CAD.  Heart muscle function.  Heart valve function.  Signs of a past heart attack.  Fluid buildup around the heart.  Thickening of the heart muscle.  A tumor or infectious growth around the heart valves. Tell a health care provider about:  Any allergies you have.  All medicines you are taking, including vitamins, herbs, eye drops, creams, and over-the-counter medicines.  Any blood disorders you have.  Any surgeries you have had.  Any medical conditions you have.  Whether you are pregnant or may be pregnant. What are the risks? Generally, this is a safe procedure. However, problems may occur, including:  Allergic reaction to dye (contrast) that may be used during the procedure. What happens before the procedure? No specific preparation is needed. You may eat and drink normally. What happens during the procedure?   An IV  tube may be inserted into one of your veins.  You may receive contrast through this tube. A contrast is an injection that improves the quality of the pictures from your heart.  A gel will be applied to your chest.  A wand-like tool (transducer) will be moved over your chest. The gel will help to transmit the sound waves  from the transducer.  The sound waves will harmlessly bounce off of your heart to allow the heart images to be captured in real-time motion. The images will be recorded on a computer. The procedure may vary among health care providers and hospitals. What happens after the procedure?  You may return to your normal, everyday life, including diet, activities, and medicines, unless your health care provider tells you not to do that. Summary  An echocardiogram is a procedure that uses painless sound waves (ultrasound) to produce an image of the heart.  Images from an echocardiogram can provide important information about the size and shape of your heart, heart muscle function, heart valve function, and fluid buildup around your heart.  You do not need to do anything to prepare before this procedure. You may eat and drink normally.  After the echocardiogram is completed, you may return to your normal, everyday life, unless your health care provider tells you not to do that. This information is not intended to replace advice given to you by your health care provider. Make sure you discuss any questions you have with your health care provider. Document Revised: 05/29/2018 Document Reviewed: 03/10/2016 Elsevier Patient Education  [employer].

## 2019-05-27 LAB — CULTURE, BLOOD (ROUTINE X 2)
Culture: NO GROWTH
Culture: NO GROWTH

## 2019-06-24 ENCOUNTER — Other Ambulatory Visit: Payer: Self-pay

## 2019-06-29 ENCOUNTER — Ambulatory Visit: Payer: Self-pay | Admitting: Cardiology

## 2019-06-30 ENCOUNTER — Encounter: Payer: Self-pay | Admitting: Cardiology

## 2019-11-19 ENCOUNTER — Other Ambulatory Visit: Payer: Self-pay

## 2019-11-20 ENCOUNTER — Ambulatory Visit (INDEPENDENT_AMBULATORY_CARE_PROVIDER_SITE_OTHER): Payer: Self-pay

## 2019-11-20 ENCOUNTER — Other Ambulatory Visit: Payer: Self-pay

## 2019-11-20 DIAGNOSIS — R0602 Shortness of breath: Secondary | ICD-10-CM

## 2019-11-20 DIAGNOSIS — R079 Chest pain, unspecified: Secondary | ICD-10-CM

## 2019-11-20 LAB — ECHOCARDIOGRAM COMPLETE
AR max vel: 3.39 cm2
AV Area VTI: 3.23 cm2
AV Area mean vel: 2.7 cm2
AV Mean grad: 3 mmHg
AV Peak grad: 5.1 mmHg
Ao pk vel: 1.13 m/s
Area-P 1/2: 4.36 cm2
Calc EF: 56.8 %
S' Lateral: 3.2 cm
Single Plane A2C EF: 58.7 %
Single Plane A4C EF: 59.1 %

## 2019-11-24 ENCOUNTER — Telehealth: Payer: Self-pay

## 2019-11-24 NOTE — Telephone Encounter (Signed)
The patient has been notified of the result via VM per DPR on file.  Encouraged patient to call back with any questions or concerns.  

## 2019-11-26 ENCOUNTER — Ambulatory Visit: Payer: Self-pay | Admitting: Cardiology

## 2020-01-07 ENCOUNTER — Observation Stay
Admission: EM | Admit: 2020-01-07 | Discharge: 2020-01-08 | Disposition: A | Discharge status: Home / Self Care | Payer: Self-pay | Attending: Family Medicine | Admitting: Family Medicine

## 2020-01-07 ENCOUNTER — Emergency Department: Payer: Self-pay

## 2020-01-07 ENCOUNTER — Encounter: Payer: Self-pay | Admitting: Emergency Medicine

## 2020-01-07 ENCOUNTER — Other Ambulatory Visit: Payer: Self-pay

## 2020-01-07 DIAGNOSIS — F17200 Nicotine dependence, unspecified, uncomplicated: Secondary | ICD-10-CM | POA: Insufficient documentation

## 2020-01-07 DIAGNOSIS — R0789 Other chest pain: Principal | ICD-10-CM | POA: Insufficient documentation

## 2020-01-07 DIAGNOSIS — Z20822 Contact with and (suspected) exposure to covid-19: Secondary | ICD-10-CM | POA: Insufficient documentation

## 2020-01-07 DIAGNOSIS — M6282 Rhabdomyolysis: Secondary | ICD-10-CM | POA: Insufficient documentation

## 2020-01-07 DIAGNOSIS — J9600 Acute respiratory failure, unspecified whether with hypoxia or hypercapnia: Secondary | ICD-10-CM | POA: Diagnosis present

## 2020-01-07 LAB — CBC
HCT: 46.5 % (ref 39.0–52.0)
Hemoglobin: 16.2 g/dL (ref 13.0–17.0)
MCH: 32.4 pg (ref 26.0–34.0)
MCHC: 34.8 g/dL (ref 30.0–36.0)
MCV: 93 fL (ref 80.0–100.0)
Platelets: 230 10*3/uL (ref 150–400)
RBC: 5 MIL/uL (ref 4.22–5.81)
RDW: 13.1 % (ref 11.5–15.5)
WBC: 8.2 10*3/uL (ref 4.0–10.5)
nRBC: 0 % (ref 0.0–0.2)

## 2020-01-07 LAB — BASIC METABOLIC PANEL
Anion gap: 11 (ref 5–15)
BUN: 15 mg/dL (ref 6–20)
CO2: 27 mmol/L (ref 22–32)
Calcium: 9.6 mg/dL (ref 8.9–10.3)
Chloride: 99 mmol/L (ref 98–111)
Creatinine, Ser: 0.79 mg/dL (ref 0.61–1.24)
GFR, Estimated: >60 mL/min (ref >60)
Glucose, Bld: 86 mg/dL (ref 70–99)
Potassium: 3.5 mmol/L (ref 3.5–5.1)
Sodium: 137 mmol/L (ref 135–145)

## 2020-01-07 LAB — TROPONIN I (HIGH SENSITIVITY): Troponin I (High Sensitivity): 7 ng/L (ref <18)

## 2020-01-07 MED ORDER — KETOROLAC TROMETHAMINE 30 MG/ML IJ SOLN
30.0000 mg | Freq: Once | INTRAMUSCULAR | Status: AC
  Administered 2020-01-07: 30 mg via INTRAVENOUS
  Filled 2020-01-07: qty 1

## 2020-01-07 MED ORDER — IOHEXOL 350 MG/ML SOLN
75.0000 mL | Freq: Once | INTRAVENOUS | Status: AC | PRN
  Administered 2020-01-07: 75 mL via INTRAVENOUS

## 2020-01-07 MED ORDER — OXYCODONE-ACETAMINOPHEN 5-325 MG PO TABS
1.0000 | ORAL_TABLET | ORAL | Status: AC
  Administered 2020-01-07: 1 via ORAL
  Filled 2020-01-07: qty 1

## 2020-01-07 MED ORDER — SODIUM CHLORIDE 0.9 % IV BOLUS
500.0000 mL | Freq: Once | INTRAVENOUS | Status: AC
  Administered 2020-01-07: 500 mL via INTRAVENOUS

## 2020-01-07 NOTE — ED Triage Notes (Addendum)
Pt arrived via POV with reports of chest pain for the past several hours. Pt states the pain radiates from L chest to entire back. Pt also reports dizziness.  Pt has been seen at [facility] for the same sxs.  Pt reports he has some sort of muscular disease in his chest and waiting to see a specialist.   Pt states he and his parents think he needs an MRI.

## 2020-01-07 NOTE — ED Notes (Signed)
Per Dr. Erma Heritage, first EKG not STEMI, do not call STEMI.  Repeat EKG completed and verified with Dr. Erma Heritage.  CP protocols initiated.

## 2020-01-07 NOTE — ED Triage Notes (Signed)
Pt reports dx with pericarditis 4-5 months ago

## 2020-01-07 NOTE — ED Notes (Signed)
Pt states that he came to be seen today because he is having increased weakness/dizziness and shakiness related to his chest pain. Pt states he was diagnosed with rhabdomyolysis a couple months ago and is worried its related. Pt states there is a lump under his left chest.

## 2020-01-07 NOTE — ED Notes (Signed)
Pt transported to CT ?

## 2020-01-08 ENCOUNTER — Encounter: Payer: Self-pay | Admitting: Family Medicine

## 2020-01-08 DIAGNOSIS — M791 Myalgia, unspecified site: Secondary | ICD-10-CM

## 2020-01-08 DIAGNOSIS — M6282 Rhabdomyolysis: Secondary | ICD-10-CM

## 2020-01-08 DIAGNOSIS — R0789 Other chest pain: Principal | ICD-10-CM

## 2020-01-08 DIAGNOSIS — J9600 Acute respiratory failure, unspecified whether with hypoxia or hypercapnia: Secondary | ICD-10-CM | POA: Diagnosis present

## 2020-01-08 DIAGNOSIS — Z87442 Personal history of urinary calculi: Secondary | ICD-10-CM

## 2020-01-08 LAB — TROPONIN I (HIGH SENSITIVITY): Troponin I (High Sensitivity): 6 ng/L (ref <18)

## 2020-01-08 LAB — HIV ANTIBODY (ROUTINE TESTING W REFLEX): HIV Screen 4th Generation wRfx: NONREACTIVE

## 2020-01-08 LAB — URINALYSIS, COMPLETE (UACMP) WITH MICROSCOPIC
Bacteria, UA: NONE SEEN
Bilirubin Urine: NEGATIVE
Glucose, UA: NEGATIVE mg/dL
Hgb urine dipstick: NEGATIVE
Ketones, ur: NEGATIVE mg/dL
Leukocytes,Ua: NEGATIVE
Nitrite: NEGATIVE
Protein, ur: NEGATIVE mg/dL
Specific Gravity, Urine: >1.046 — ABNORMAL HIGH (ref 1.005–1.030)
Squamous Epithelial / HPF: NONE SEEN (ref 0–5)
pH: 7 (ref 5.0–8.0)

## 2020-01-08 LAB — HEPATIC FUNCTION PANEL
ALT: 56 U/L — ABNORMAL HIGH (ref 0–44)
AST: 90 U/L — ABNORMAL HIGH (ref 15–41)
Albumin: 3.8 g/dL (ref 3.5–5.0)
Alkaline Phosphatase: 70 U/L (ref 38–126)
Bilirubin, Direct: 0.1 mg/dL (ref 0.0–0.2)
Indirect Bilirubin: 0.8 mg/dL (ref 0.3–0.9)
Total Bilirubin: 0.9 mg/dL (ref 0.3–1.2)
Total Protein: 7.1 g/dL (ref 6.5–8.1)

## 2020-01-08 LAB — RESP PANEL BY RT-PCR (FLU A&B, COVID) ARPGX2
Influenza A by PCR: NEGATIVE
Influenza B by PCR: NEGATIVE
SARS Coronavirus 2 by RT PCR: NEGATIVE

## 2020-01-08 LAB — CK: Total CK: 7774 U/L — ABNORMAL HIGH (ref 49–397)

## 2020-01-08 MED ORDER — ASPIRIN EC 81 MG PO TBEC
81.0000 mg | DELAYED_RELEASE_TABLET | Freq: Every day | ORAL | Status: DC
  Administered 2020-01-08: 81 mg via ORAL
  Filled 2020-01-08: qty 1

## 2020-01-08 MED ORDER — OXYCODONE-ACETAMINOPHEN 5-325 MG PO TABS
1.0000 | ORAL_TABLET | ORAL | Status: AC
  Administered 2020-01-08: 1 via ORAL
  Filled 2020-01-08: qty 1

## 2020-01-08 MED ORDER — TRAZODONE HCL 50 MG PO TABS
50.0000 mg | ORAL_TABLET | Freq: Every day | ORAL | 1 refills | Status: DC
Start: 2020-01-08 — End: 2021-06-10

## 2020-01-08 MED ORDER — ENOXAPARIN SODIUM 40 MG/0.4ML ~~LOC~~ SOLN
40.0000 mg | SUBCUTANEOUS | Status: DC
  Administered 2020-01-08: 40 mg via SUBCUTANEOUS
  Filled 2020-01-08: qty 0.4

## 2020-01-08 MED ORDER — ONDANSETRON HCL 4 MG/2ML IJ SOLN: 4.0000 mg | Freq: Four times a day (QID) | INTRAMUSCULAR | Status: DC | PRN

## 2020-01-08 MED ORDER — TRAZODONE HCL 50 MG PO TABS: 25.0000 mg | ORAL_TABLET | Freq: Every evening | ORAL | Status: DC | PRN

## 2020-01-08 MED ORDER — SODIUM CHLORIDE 0.9 % IV BOLUS
1500.0000 mL | Freq: Once | INTRAVENOUS | Status: AC
  Administered 2020-01-08: 1500 mL via INTRAVENOUS

## 2020-01-08 MED ORDER — MORPHINE SULFATE (PF) 2 MG/ML IV SOLN
2.0000 mg | INTRAVENOUS | Status: DC | PRN
  Administered 2020-01-08 (×2): 2 mg via INTRAVENOUS
  Filled 2020-01-08 (×2): qty 1

## 2020-01-08 MED ORDER — MAGNESIUM HYDROXIDE 400 MG/5ML PO SUSP: 30.0000 mL | Freq: Every day | ORAL | Status: DC | PRN

## 2020-01-08 MED ORDER — ONDANSETRON HCL 4 MG PO TABS: 4.0000 mg | ORAL_TABLET | Freq: Four times a day (QID) | ORAL | Status: DC | PRN

## 2020-01-08 MED ORDER — IBUPROFEN 200 MG PO TABS: 600.0000 mg | ORAL_TABLET | Freq: Four times a day (QID) | ORAL | 0 refills | Status: DC | PRN

## 2020-01-08 MED ORDER — IBUPROFEN 400 MG PO TABS
600.0000 mg | ORAL_TABLET | Freq: Four times a day (QID) | ORAL | Status: DC
  Administered 2020-01-08 (×2): 600 mg via ORAL
  Filled 2020-01-08 (×2): qty 2

## 2020-01-08 MED ORDER — ACETAMINOPHEN 650 MG RE SUPP: 650.0000 mg | Freq: Four times a day (QID) | RECTAL | Status: DC | PRN

## 2020-01-08 MED ORDER — MORPHINE SULFATE (PF) 4 MG/ML IV SOLN
4.0000 mg | Freq: Once | INTRAVENOUS | Status: AC
  Administered 2020-01-08: 4 mg via INTRAVENOUS
  Filled 2020-01-08: qty 1

## 2020-01-08 MED ORDER — NITROGLYCERIN 0.4 MG SL SUBL: 0.4000 mg | SUBLINGUAL_TABLET | SUBLINGUAL | Status: DC | PRN

## 2020-01-08 MED ORDER — ADULT MULTIVITAMIN W/MINERALS CH
1.0000 | ORAL_TABLET | Freq: Every day | ORAL | Status: DC
  Administered 2020-01-08: 1 via ORAL
  Filled 2020-01-08: qty 1

## 2020-01-08 MED ORDER — BUSPIRONE HCL 10 MG PO TABS: 10.0000 mg | ORAL_TABLET | Freq: Three times a day (TID) | ORAL | 1 refills | Status: DC

## 2020-01-08 MED ORDER — SODIUM CHLORIDE 0.9 % IV SOLN: INTRAVENOUS | Status: DC

## 2020-01-08 MED ORDER — ACETAMINOPHEN 325 MG PO TABS: 650.0000 mg | ORAL_TABLET | Freq: Four times a day (QID) | ORAL | Status: DC | PRN

## 2020-01-08 NOTE — Discharge Instructions (Signed)

## 2020-01-08 NOTE — Plan of Care (Signed)
  Problem: Education: Goal: Knowledge of General Education information will improve Description: Including pain rating scale, medication(s)/side effects and non-pharmacologic comfort measures 01/08/2020 1354 by Ulis Rias, RN Outcome: Adequate for Discharge 01/08/2020 0845 by Ulis Rias, RN Outcome: Progressing   Problem: Health Behavior/Discharge Planning: Goal: Ability to manage health-related needs will improve 01/08/2020 1354 by Ulis Rias, RN Outcome: Adequate for Discharge 01/08/2020 0845 by Ulis Rias, RN Outcome: Progressing   Problem: Clinical Measurements: Goal: Ability to maintain clinical measurements within normal limits will improve 01/08/2020 1354 by Ulis Rias, RN Outcome: Adequate for Discharge 01/08/2020 0845 by Ulis Rias, RN Outcome: Progressing Goal: Will remain free from infection 01/08/2020 1354 by Ulis Rias, RN Outcome: Adequate for Discharge 01/08/2020 0845 by Ulis Rias, RN Outcome: Progressing Goal: Diagnostic test results will improve 01/08/2020 1354 by Ulis Rias, RN Outcome: Adequate for Discharge 01/08/2020 0845 by Ulis Rias, RN Outcome: Progressing Goal: Respiratory complications will improve 01/08/2020 1354 by Ulis Rias, RN Outcome: Adequate for Discharge 01/08/2020 0845 by Ulis Rias, RN Outcome: Progressing Goal: Cardiovascular complication will be avoided 01/08/2020 1354 by Ulis Rias, RN Outcome: Adequate for Discharge 01/08/2020 0845 by Ulis Rias, RN Outcome: Progressing   Problem: Activity: Goal: Risk for activity intolerance will decrease 01/08/2020 1354 by Ulis Rias, RN Outcome: Adequate for Discharge 01/08/2020 0845 by Ulis Rias, RN Outcome: Progressing   Problem: Nutrition: Goal: Adequate nutrition will be maintained 01/08/2020 1354 by Ulis Rias, RN Outcome: Adequate for Discharge 01/08/2020 0845 by Ulis Rias, RN Outcome: Progressing   Problem: Coping: Goal: Level of anxiety  will decrease 01/08/2020 1354 by Ulis Rias, RN Outcome: Adequate for Discharge 01/08/2020 0845 by Ulis Rias, RN Outcome: Progressing   Problem: Elimination: Goal: Will not experience complications related to bowel motility 01/08/2020 1354 by Ulis Rias, RN Outcome: Adequate for Discharge 01/08/2020 0845 by Ulis Rias, RN Outcome: Progressing Goal: Will not experience complications related to urinary retention 01/08/2020 1354 by Ulis Rias, RN Outcome: Adequate for Discharge 01/08/2020 0845 by Ulis Rias, RN Outcome: Progressing   Problem: Pain Managment: Goal: General experience of comfort will improve 01/08/2020 1354 by Ulis Rias, RN Outcome: Adequate for Discharge 01/08/2020 0845 by Ulis Rias, RN Outcome: Progressing   Problem: Safety: Goal: Ability to remain free from injury will improve 01/08/2020 1354 by Ulis Rias, RN Outcome: Adequate for Discharge 01/08/2020 0845 by Ulis Rias, RN Outcome: Progressing   Problem: Skin Integrity: Goal: Risk for impaired skin integrity will decrease 01/08/2020 1354 by Ulis Rias, RN Outcome: Adequate for Discharge 01/08/2020 0845 by Ulis Rias, RN Outcome: Progressing

## 2020-01-08 NOTE — ED Provider Notes (Signed)
[facility] Emergency Department Provider Note ____________________________________________   First MD Initiated Contact with Patient 01/07/20 2319     (approximate)  I have reviewed the triage vital signs and the nursing notes.  HISTORY  Chief Complaint Chest Pain    HPI Roberto Stanton is a 24 y.o. male here for evaluation of chest pain  Patient has been experiencing chest pain now for about a month and a half, reports that he is also was at [facility] ER about a week ago for the same.  He has been experiencing pain over the left side of his chest.  It sharp in nature.  Over the last couple days it seems to be radiating towards his left upper back when he takes deep breaths.  He does report there is a spot just left of the breastbone that seems tender to palpation.  No fevers or chills.  No nausea vomiting.  No personal history of coronary disease.  Does report a history of "pericarditis in the past.  The pain is not worsened by laying flat, does not alleviate or worsen with sitting up  No leg swelling.   Past Medical History:  Diagnosis Date  . Kidney stones   . Pericarditis     There are no problems to display for this patient.   History reviewed. No pertinent surgical history.  Prior to Admission medications   Medication Sig Start Date End Date Taking? Authorizing Provider  acetaminophen (TYLENOL) 500 MG tablet Take 500 mg by mouth every 6 (six) hours as needed for mild pain.   Yes [provider]  ibuprofen (ADVIL) 200 MG tablet Take 200 mg by mouth every 6 (six) hours as needed for moderate pain.   Yes [provider]  Multiple Vitamin (MULTIVITAMIN) tablet Take 1 tablet by mouth daily. Taking 1 tablet daily   Yes [provider]  oxyCODONE (OXY IR/ROXICODONE) 5 MG immediate release tablet Take 5 mg by mouth 2 (two) times daily as needed. Patient not taking: Reported on 01/07/2020 12/31/19   [provider]     Allergies Patient has no known allergies.  No family history on file.  Social History Social History   Tobacco Use  . Smoking status: Current Every Day Smoker  . Smokeless tobacco: Never Used  Vaping Use  . Vaping Use: Never used  Substance Use Topics  . Alcohol use: Yes    Comment: occasional  . Drug use: Yes    Types: Marijuana    Review of Systems Constitutional: No fever/chills Eyes: No visual changes. ENT: No sore throat. Cardiovascular: See HPI Respiratory: Denies shortness of breath except when the pain heightens seems take his breath away. Gastrointestinal: No abdominal pain.   Musculoskeletal: Negative for back pain except the pain in his chest seems to radiate towards his left upper back at times. Skin: Negative for rash. Neurological: Negative for headaches, areas of focal weakness or numbness.    ____________________________________________   PHYSICAL EXAM:  VITAL SIGNS: ED Triage Vitals  Enc Vitals Group     BP 01/07/20 2136 (!) 164/103     Pulse Rate 01/07/20 2136 100     Resp 01/07/20 2136 20     Temp 01/07/20 2136 98.7 F (37.1 C)     Temp Source 01/07/20 2136 Oral     SpO2 01/07/20 2136 100 %     Weight 01/07/20 2136 135 lb (61.2 kg)     Height 01/07/20 2136 5\' 9"  (1.753 m)  Head Circumference --      Peak Flow --      Pain Score 01/07/20 2135 5     Pain Loc --      Pain Edu? --      Excl. in GC? --     Constitutional: Alert and oriented. Well appearing and in no acute distress. Eyes: Conjunctivae are normal. Head: Atraumatic. Nose: No congestion/rhinnorhea. Mouth/Throat: Mucous membranes are moist. Neck: No stridor.  Cardiovascular: Normal rate, regular rhythm. Grossly normal heart sounds.  Good peripheral circulation.  Patient has an area just left of the sternum at the costovertebral angle that is point tender and reproduces pain. Respiratory: Normal respiratory effort.  No retractions. Lungs CTAB. Gastrointestinal: Soft  and nontender. No distention. Musculoskeletal: No lower extremity tenderness nor edema. Neurologic:  Normal speech and language. No gross focal neurologic deficits are appreciated.  Skin:  Skin is warm, dry and intact. No rash noted. Psychiatric: Mood and affect are normal. Speech and behavior are normal.  ____________________________________________   LABS (all labs ordered are listed, but only abnormal results are displayed)  Labs Reviewed  CK - Abnormal; Notable for the following components:      Result Value   Total CK 7,774 (*)    All other components within normal limits  RESP PANEL BY RT-PCR (FLU A&B, COVID) ARPGX2  BASIC METABOLIC PANEL  CBC  URINALYSIS, COMPLETE (UACMP) WITH MICROSCOPIC  HEPATIC FUNCTION PANEL  TROPONIN I (HIGH SENSITIVITY)  TROPONIN I (HIGH SENSITIVITY)   ____________________________________________  EKG  Reviewed interpreted by me at 2140 Heart rate 90 QRS 99 QTc 400 Sinus rhythm, right bundle branch block appearance.  Repolarization abnormality.  In review of previous EKGs this seems to be a similar repolarization abnormality without evidence of acute change ____________________________________________  RADIOLOGY  DG Chest 2 View  Result Date: 01/07/2020 CLINICAL DATA:  Chest pain several hours EXAM: CHEST - 2 VIEW COMPARISON:  Radiograph 11/02/2019 FINDINGS: Chronic mild hyperinflation. No consolidation, features of edema, pneumothorax, or effusion. Pulmonary vascularity is normally distributed. The cardiomediastinal contours are unremarkable. No acute osseous or soft tissue abnormality. IMPRESSION: No acute cardiopulmonary abnormality. Mild chronic hyperinflation. Electronically Signed   By: Kreg Shropshire M.D.   On: 01/07/2020 21:58    IMPRESSION: 1. No evidence of pulmonary embolism. 2. No acute intrathoracic process. 3. 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  CT angio chest as above, reviewed personally and viewed by me.  No evidence of acute pulmonary embolism. ____________________________________________   PROCEDURES  Procedure(s) performed: None  Procedures  Critical Care performed: No  ____________________________________________   INITIAL IMPRESSION / ASSESSMENT AND PLAN / ED COURSE  Pertinent labs & imaging results that were available during my care of the patient were reviewed by me and considered in my medical decision making (see chart for details).   Differential diagnosis includes, but is not limited to, ACS, aortic dissection, pulmonary embolism, cardiac tamponade, pneumothorax, pneumonia, pericarditis, myocarditis, GI-related causes including esophagitis/gastritis, and musculoskeletal chest wall pain.    Reassuring work-up including troponin and EKG.  No severe risk factors for coronary disease and I doubt ACS.  This very reproducible very atypical pain.  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  Discussed with the patient and I suspect this is likely musculoskeletal, but would recommend he have close follow-up with specialist and recommend follow-up with mom both pain specialist Dr. Cherylann Ratel as well as a follow-up regarding his imaging with Dr. Thelma Barge from cardiothoracic.  Patient understand agreeable with plan.  Second troponin normal, CK however highly elevated indicative of rhabdo.  Review of his old records from [facility] it appears he was being evaluated for that recommended outpatient follow-up for possible myopathy.  He also had elevated transaminases that improved as well as is improving CK with treatment at [facility].  Discussed with the patient, understanding and agreeable with plan for admission here for further care and work-up.  Discussed with the hospitalist Dr. Arville Care      ____________________________________________   FINAL CLINICAL IMPRESSION(S) / ED DIAGNOSES  Final diagnoses:  Atypical chest pain  Non-traumatic rhabdomyolysis        Note:  This document was prepared using Dragon voice recognition software and may include unintentional dictation errors       Sharyn Creamer, MD 01/08/20 0425

## 2020-01-08 NOTE — Discharge Summary (Signed)
Physician Discharge Summary  Roberto Stanton YTK:[id number] DOB: February 13, 1996 DOA: 01/07/2020  PCP: Roberto Stanton  Admit date: 01/07/2020 Discharge date: 01/08/2020  Time spent: 27 minutes  Recommendations for Outpatient Follow-up:  1. Recommend outpatient PCP follow-up and repeat CK 2. Recommend psychiatry outpatient input and titration of trazodone and or addition of SSRI 3. Patient aware to follow-up at [facility]-missed his appointment 10/29/2019 and has been rescheduled it may be a while before he is able to get further follow-up in the outpatient setting  Discharge Diagnoses:  Active Problems:   Acute respiratory failure [facility])   Discharge Condition: Improved  Diet recommendation: Heart healthy  Filed Weights   01/07/20 2136 01/08/20 0549  Weight: 61.2 kg 61.3 kg    History of present illness:  24 year old white male Chronic dental infection Diagnosed at ED in [place] with pericarditis discharged on antibiotics Augmentin-followed up 05/26/2019 and came to ED for recurrent chest pain felt to be musculoskeletal given ibuprofen and discharged home It appears patient was subsequently admitted [facility] 10/30/2019-11/01/2019 with rhabdomyolysis CK >40,000-patient was treated with aggressive IV saline CK improved to 11,000-was set up to follow-up with neurologist for metabolic myelopathy and needs for EMG in the outpatient setting at that time he was also 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 and was told to avoid marijuana  Came to emergency room with left-sided pain shopping radiating to the left upper back Work-up in emergency room showed area in the mediastinum likely representing a 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-CK was 7774, troponin I 76 BUNs/creatinine 15/0.7 AST ALT 90/56   I reviewed his entire hospitalization at [facility] and reviewed the emergency room visits  I discussed with the patient and it seems like he is having central chest pressure pain which is  reproducible-I do not think this is cardiac in nature-nursing staff and myself spent about 10 minutes in the room talking to him about his diagnosis and reassuring him that this is probably not related to his rhabdo as his level was 7074 this admission and last admission he was discharged with a level of 11,000 Patient admits to significant anxiety-he has not seen a psychiatrist and he missed his appointment at [facility] for unknown Roberto Stanton is on limited work duties at 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 so it is unclear at this time if there is any further precipitant he has not been doing any heavy weight lifting and or CrossFit-like activities or strenuous workouts We shared discussions and decision making and I explained to him that opiates are not the answer for his type of chest pain instead I think he is self treating with marijuana for his undiagnosed anxiety and offered to give him trazodone and have increased the dose on discharge and have prescribed BuSpar to him as well He definitely still needs to be seen by [facility] for his subspecialty care but primarily does also need to see his psychiatrist in the outpatient setting and we have enlisted the help of TOC to assist with the same  I feel he is reasonably stabilized for discharge   Discharge Exam: Vitals:   01/08/20 0549 01/08/20 0758  BP: (!) 131/93 (!) 139/96  Pulse: 77 67  Resp: 16 18  Temp: 97.7 F (36.5 C) 97.7 F (36.5 C)  SpO2: 100% 100%    General: Awake alert coherent no distress EOMI NCAT Cardiovascular: S1-S2 no murmur no rub no gallop slightly tachycardic no rales rhonchi  Respiratory: Clinically clear no added sound Abdomen soft no rebound no guarding No lower extremity edema Anxious appearing  Discharge Instructions   Discharge Instructions    Diet - low sodium heart healthy   Complete by: As directed    Discharge instructions   Complete by: As directed    You were diagnosed  with a very mild case of rhabdomyolysis and had mild abnormalities on your chemistry panel You do not have any heart damage do not have any significant structural heart issue but you do have a small area in your chest that may need further attention at a tertiary care center-this gland is called a thymus and usually completely goes away as you become an adult and this may not be related to your rhabdomyolysis Please follow-up with [facility] for further work-up with neurology for your muscle breakdown issues. My suggestion is not to use any controlled substances and/or marijuana as marijuana is associated in some cases with psychosis and worsening of anxiety and although you are self treating himself with this-a better option would be to use the BuSpar medication that I have prescribed for you I have also prescribed limited amounts of trazodone to help you sleep at night to help you deal and cope with the anxiety that you are feeling Both of these medications have 1 refill you will need to follow-up with primary care we will asked that our case management team help you find out where you can go to get a PCP   Increase activity slowly   Complete by: As directed      Allergies as of 01/08/2020   No Known Allergies     Medication List    STOP taking these medications   oxyCODONE 5 MG immediate release tablet Commonly known as: Oxy IR/ROXICODONE     TAKE these medications   acetaminophen 500 MG tablet Commonly known as: TYLENOL Take 500 mg by mouth every 6 (six) hours as needed for mild pain.   busPIRone 10 MG tablet Commonly known as: BUSPAR Take 1 tablet (10 mg total) by mouth 3 (three) times daily.   ibuprofen 200 MG tablet Commonly known as: ADVIL Take 3 tablets (600 mg total) by mouth every 6 (six) hours as needed for moderate pain. What changed: how much to take   multivitamin tablet Take 1 tablet by mouth daily. Taking 1 tablet daily   traZODone 50 MG tablet Commonly  known as: DESYREL Take 1 tablet (50 mg total) by mouth at bedtime.      No Known Allergies  Follow-up Information    Schedule an appointment as soon as possible for a visit  with Hulda Marin, MD.   Specialties: Cardiothoracic Surgery, General Surgery Why: For ER visit follow-up and reevaluation. Contact information: [address] [place] [address] [phone]        Schedule an appointment as soon as possible for a visit  with Edward Jolly, MD.   Specialty: Pain Medicine Why: For ER visit follow-up and reevaluation. Contact information: [address] [place] [address] [phone]                The results of significant diagnostics from this hospitalization (including imaging, microbiology, ancillary and laboratory) are listed below for reference.    Significant Diagnostic Studies: DG Chest 2 View  Result Date: 01/07/2020 CLINICAL DATA:  Chest pain several hours EXAM: CHEST - 2 VIEW COMPARISON:  Radiograph 11/02/2019 FINDINGS: Chronic mild hyperinflation. No consolidation, features of  edema, pneumothorax, or effusion. Pulmonary vascularity is normally distributed. The cardiomediastinal contours are unremarkable. No acute osseous or soft tissue abnormality. IMPRESSION: No acute cardiopulmonary abnormality. Mild chronic hyperinflation. Electronically Signed   By: Kreg ShropshirePrice  DeHay M.D.   On: 01/07/2020 21:58   CT Angio Chest PE W and/or Wo Contrast  Result Date: 01/08/2020 CLINICAL DATA:  Chest pain parasternal EXAM: CT ANGIOGRAPHY CHEST WITH CONTRAST TECHNIQUE: Multidetector CT imaging of the chest was performed using the standard protocol during bolus administration of intravenous contrast. Multiplanar CT image reconstructions and MIPs were obtained to evaluate the vascular anatomy. CONTRAST:  75mL OMNIPAQUE IOHEXOL 350 MG/ML SOLN COMPARISON:  CT 05/21/2019, radiograph 01/07/2020 FINDINGS: Cardiovascular: Satisfactory opacification the  pulmonary arteries to the segmental level. No pulmonary artery filling defects are identified. Central pulmonary arteries are normal caliber. Normal heart size. No pericardial effusion. The aortic root is suboptimally assessed given cardiac pulsation artifact. The aorta is normal caliber. No acute luminal abnormality of the imaged aorta. No periaortic stranding or hemorrhage. Normal 3 vessel branching of the aortic arch. Proximal great vessels are unremarkable. No major venous abnormalities. Mediastinum/Nodes: Wedge-shaped soft tissue attenuation in the anterior mediastinum. No mediastinal fluid or gas. Normal thyroid gland and thoracic inlet. No acute abnormality of the trachea or esophagus. No worrisome mediastinal, hilar or axillary adenopathy. Lungs/Pleura: No consolidation, features of edema, pneumothorax, or effusion. No suspicious pulmonary nodules or masses. Upper Abdomen: No acute abnormalities present in the visualized portions of the upper abdomen. Musculoskeletal: No chest wall abnormality. No acute or significant osseous findings. Review of the MIP images confirms the above findings. IMPRESSION: 1. No evidence of pulmonary embolism. 2. No acute intrathoracic process. 3. 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 Electronically Signed   By: Kreg ShropshirePrice  DeHay M.D.   On: 01/08/2020 00:24    Microbiology: Recent Results (from the past 240 hour(s))  Resp Panel by RT-PCR (Flu A&B, Covid) Nasopharyngeal Swab     Status: None   Collection Time: 01/08/20  4:20 AM   Specimen: Nasopharyngeal Swab; Nasopharyngeal(NP) swabs in vial transport medium  Result Value Ref Range Status   SARS Coronavirus 2 by RT PCR NEGATIVE NEGATIVE Final    Comment: (NOTE) SARS-CoV-2 target nucleic acids are  NOT DETECTED.  The SARS-CoV-2 RNA is generally detectable in upper respiratory specimens during the acute phase of infection. The lowest concentration of SARS-CoV-2 viral copies this assay can detect is 138 copies/mL. A negative result does not preclude SARS-Cov-2 infection and should not be used as the sole basis for treatment or other patient management decisions. A negative result may occur with  improper specimen collection/handling, submission of specimen other than nasopharyngeal swab, presence of viral mutation(s) within the areas targeted by this assay, and inadequate number of viral copies(<138 copies/mL). A negative result must be combined with clinical observations, patient history, and epidemiological information. The expected result is Negative.  Fact Sheet for Patients:  BloggerCourse.comhttps://www.fda.gov/media/152166/download  Fact Sheet for Healthcare Providers:  SeriousBroker.ithttps://www.fda.gov/media/152162/download  This test is no t yet approved or cleared by the [place] FDA and  has been authorized for detection and/or diagnosis of SARS-CoV-2 by FDA under an Emergency Use Authorization (EUA). This EUA will remain  in effect (meaning this test can be used) for the duration of the COVID-19 declaration under Section 564(b)(1) of the  Act, 21 U.S.C.section 360bbb-3(b)(1), unless the authorization is terminated  or revoked sooner.       Influenza A by PCR NEGATIVE NEGATIVE Final   Influenza B by PCR NEGATIVE NEGATIVE Final    Comment: (NOTE) The Xpert Xpress SARS-CoV-2/FLU/RSV plus assay is intended as an aid in the diagnosis of influenza from Nasopharyngeal swab specimens and should not be used as a sole basis for treatment. Nasal washings and aspirates are unacceptable for Xpert Xpress SARS-CoV-2/FLU/RSV testing.  Fact Sheet for Patients: BloggerCourse.com  Fact Sheet for Healthcare Providers: SeriousBroker.it  This test is not  yet approved or cleared by the [place] FDA and has been authorized for detection and/or diagnosis of SARS-CoV-2 by FDA under an Emergency Use Authorization (EUA). This EUA will remain in effect (meaning this test can be used) for the duration of the COVID-19 declaration under Section 564(b)(1) of the Act, 21 U.S.C. section 360bbb-3(b)(1), unless the authorization is terminated or revoked.  Performed at [facility], [address] [place], [place] [address]      Labs: Basic Metabolic Panel: Recent Labs  Lab 01/07/20 2147  NA 137  K 3.5  CL 99  CO2 27  GLUCOSE 86  BUN 15  CREATININE 0.79  CALCIUM 9.6   Liver Function Tests: Recent Labs  Lab 01/08/20 0556  AST 90*  ALT 56*  ALKPHOS 70  BILITOT 0.9  PROT 7.1  ALBUMIN 3.8   No results for input(s): LIPASE, AMYLASE in the last 168 hours. No results for input(s): AMMONIA in the last 168 hours. CBC: Recent Labs  Lab 01/07/20 2147  WBC 8.2  HGB 16.2  HCT 46.5  MCV 93.0  PLT 230   Cardiac Enzymes: Recent Labs  Lab 01/08/20 0056  CKTOTAL 7,774*   BNP: BNP (last 3 results) No results for input(s): BNP in the last 8760 hours.  ProBNP (last 3 results) No results for input(s): PROBNP in the last 8760 hours.  CBG: No results for input(s): GLUCAP in the last 168 hours.     Signed:  Rhetta Mura MD   [facility] 01/08/2020, 10:10 AM

## 2020-01-08 NOTE — Plan of Care (Signed)

## 2020-01-08 NOTE — H&P (Signed)
[place]   PATIENT NAME: Roberto Stanton    MR#:  [id number]  DATE OF BIRTH:  Feb 10, 1996  DATE OF ADMISSION:  01/07/2020  PRIMARY CARE PHYSICIAN: Patient, No Pcp Per   REQUESTING/REFERRING PHYSICIAN: Sharyn Creamer, MD CHIEF COMPLAINT:   Chief Complaint  Patient presents with  . Chest Pain    HISTORY OF PRESENT ILLNESS:  Roberto Stanton  is a 24 y.o. Caucasian male with a known history of urolithiasis and pericarditis as well as rhabdomyolysis and suspected myopathy was recently seen in [facility] for rhabdomyolysis was treated and discharged in the ER.  He was admitted for a couple months ago per his report.  He comes today complaining of generalized muscle aches with occasional spasms in his arms as well as intermittent left-sided chest pain occasionally sharp and occasionally pressure and heaviness with no radiation no nausea or vomiting or diaphoresis.  He denies any leg edema or recent travels or surgeries.  He has been having mild dyspnea.  No cough or wheezing.  He denied any trauma.  Upon presentation to the emergency room, blood pressure was 164/103 then 123/82 with otherwise normal vital signs.  Labs revealed urine specific gravity more than 1046 with borderline potassium of 3.5 and otherwise normal BMP.  Is total CK was 7774 and high-sensitivity troponin I was 17 6 with normal CBC.  EKG showed normal sinus rhythm with a rate of 88 with right atrial enlargement early repolarization and peaked T waves in V3 through V5.  The patient was given 40 of IV morphine sulfate and 50 mg of IV Toradol as well as 2 p.o. Percocet and 2 L bolus of IV normal saline.  He will be admitted to a progressive unit bed for further evaluation and management. PAST MEDICAL HISTORY:   Past Medical History:  Diagnosis Date  . Kidney stones   . Pericarditis     PAST SURGICAL HISTORY:  History reviewed. No pertinent surgical history.Marland Kitchen  He denies any previous surgeries  SOCIAL HISTORY:    Social History   Tobacco Use  . Smoking status: Current Every Day Smoker  . Smokeless tobacco: Never Used  Substance Use Topics  . Alcohol use: Yes    Comment: occasional    FAMILY HISTORY:  Positive for diabetes mellitus, hypertension and coronary artery disease.  His father had MI in his early 36s. DRUG ALLERGIES:  No Known Allergies  REVIEW OF SYSTEMS:   ROS As per history of present illness. All pertinent systems were reviewed above. Constitutional, HEENT, cardiovascular, respiratory, GI, GU, musculoskeletal, neuro, psychiatric, endocrine, integumentary and hematologic systems were reviewed and are otherwise negative/unremarkable except for positive findings mentioned above in the HPI.   MEDICATIONS AT HOME:   Prior to Admission medications   Medication Sig Start Date End Date Taking? Authorizing Provider  acetaminophen (TYLENOL) 500 MG tablet Take 500 mg by mouth every 6 (six) hours as needed for mild pain.   Yes [provider]  ibuprofen (ADVIL) 200 MG tablet Take 200 mg by mouth every 6 (six) hours as needed for moderate pain.   Yes [provider]  Multiple Vitamin (MULTIVITAMIN) tablet Take 1 tablet by mouth daily. Taking 1 tablet daily   Yes [provider]  oxyCODONE (OXY IR/ROXICODONE) 5 MG immediate release tablet Take 5 mg by mouth 2 (two) times daily as needed. Patient not taking: Reported on 01/07/2020 12/31/19   [provider]      VITAL SIGNS:  Blood  pressure (!) 125/97, pulse 82, temperature 98.7 F (37.1 C), temperature source Oral, resp. rate 16, height 5\' 9"  (1.753 m), weight 61.2 kg, SpO2 96 %.  PHYSICAL EXAMINATION:  Physical Exam  GENERAL:  24 y.o.-year-old Caucasian male patient lying in the bed with no acute distress.  EYES: Pupils equal, round, reactive to light and accommodation. No scleral icterus. Extraocular muscles intact.  HEENT: Head atraumatic, normocephalic. Oropharynx and nasopharynx clear.   NECK:  Supple, no jugular venous distention. No thyroid enlargement, no tenderness.  LUNGS: Normal breath sounds bilaterally, no wheezing, rales,rhonchi or crepitation. No use of accessory muscles of respiration.  CARDIOVASCULAR: Regular rate and rhythm, S1, S2 normal. No murmurs, rubs, or gallops.  ABDOMEN: Soft, nondistended, nontender. Bowel sounds present. No organomegaly or mass.  EXTREMITIES: No pedal edema, cyanosis, or clubbing.  NEUROLOGIC: Cranial nerves II through XII are intact. Muscle strength 5/5 in all extremities. Sensation intact. Gait not checked. Musculoskeletal: He had reproducible left-sided chest pain to palpation. PSYCHIATRIC: The patient is alert and oriented x 3.  Normal affect and good eye contact. SKIN: No obvious rash, lesion, or ulcer.   LABORATORY PANEL:   CBC Recent Labs  Lab 01/07/20 2147  WBC 8.2  HGB 16.2  HCT 46.5  PLT 230   ------------------------------------------------------------------------------------------------------------------  Chemistries  Recent Labs  Lab 01/07/20 2147  NA 137  K 3.5  CL 99  CO2 27  GLUCOSE 86  BUN 15  CREATININE 0.79  CALCIUM 9.6   ------------------------------------------------------------------------------------------------------------------  Cardiac Enzymes No results for input(s): TROPONINI in the last 168 hours. ------------------------------------------------------------------------------------------------------------------  RADIOLOGY:  DG Chest 2 View  Result Date: 01/07/2020 CLINICAL DATA:  Chest pain several hours EXAM: CHEST - 2 VIEW COMPARISON:  Radiograph 11/02/2019 FINDINGS: Chronic mild hyperinflation. No consolidation, features of edema, pneumothorax, or effusion. Pulmonary vascularity is normally distributed. The cardiomediastinal contours are unremarkable. No acute osseous or soft tissue abnormality. IMPRESSION: No acute cardiopulmonary abnormality. Mild chronic hyperinflation.  Electronically Signed   By: 11/04/2019 M.D.   On: 01/07/2020 21:58   CT Angio Chest PE W and/or Wo Contrast  Result Date: 01/08/2020 CLINICAL DATA:  Chest pain parasternal EXAM: CT ANGIOGRAPHY CHEST WITH CONTRAST TECHNIQUE: Multidetector CT imaging of the chest was performed using the standard protocol during bolus administration of intravenous contrast. Multiplanar CT image reconstructions and MIPs were obtained to evaluate the vascular anatomy. CONTRAST:  54mL OMNIPAQUE IOHEXOL 350 MG/ML SOLN COMPARISON:  CT 05/21/2019, radiograph 01/07/2020 FINDINGS: Cardiovascular: Satisfactory opacification the pulmonary arteries to the segmental level. No pulmonary artery filling defects are identified. Central pulmonary arteries are normal caliber. Normal heart size. No pericardial effusion. The aortic root is suboptimally assessed given cardiac pulsation artifact. The aorta is normal caliber. No acute luminal abnormality of the imaged aorta. No periaortic stranding or hemorrhage. Normal 3 vessel branching of the aortic arch. Proximal great vessels are unremarkable. No major venous abnormalities. Mediastinum/Nodes: Wedge-shaped soft tissue attenuation in the anterior mediastinum. No mediastinal fluid or gas. Normal thyroid gland and thoracic inlet. No acute abnormality of the trachea or esophagus. No worrisome mediastinal, hilar or axillary adenopathy. Lungs/Pleura: No consolidation, features of edema, pneumothorax, or effusion. No suspicious pulmonary nodules or masses. Upper Abdomen: No acute abnormalities present in the visualized portions of the upper abdomen. Musculoskeletal: No chest wall abnormality. No acute or significant osseous findings. Review of the MIP images confirms the above findings. IMPRESSION: 1. No evidence of pulmonary embolism. 2. No acute intrathoracic process. 3. 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 Electronically Signed   By: Kreg Shropshire M.D.   On: 01/08/2020 00:24      IMPRESSION AND PLAN:   1.  Acute severe rhabdomyolysis with subsequent myalgia. -The patient will be admitted to a progressive unit bed. -Will be aggressively hydrated with IV normal saline. -We will follow serial CKs. -We will monitor renal functions.  2.  Chest pain, likely atypical probably musculoskeletal. -We will cycle troponin eyes and manage as above. -Pain management will be provided.  3.  History of nephrolithiasis with no current symptoms.  4.  DVT prophylaxis. -Subcutaneous Lovenox.   All the records are reviewed and case discussed with ED provider. The plan of care was discussed in details with the patient (and family). I answered all questions. The patient agreed to proceed with the above mentioned plan. Further management will depend upon hospital course.   CODE STATUS: Full code  Status is: Inpatient  Remains inpatient appropriate because:Ongoing active pain requiring inpatient pain management, Ongoing diagnostic testing needed not appropriate for outpatient work up, Unsafe d/c plan, IV treatments appropriate due to intensity of illness or inability to take PO and Inpatient level of care appropriate due to severity of illness   Dispo: The patient is from: Home              Anticipated d/c is to: Home              Anticipated d/c date is: 2 days              Patient currently is not medically stable to d/c.   TOTAL TIME TAKING CARE OF THIS PATIENT: 55 minutes.    Hannah Beat M.D on 01/08/2020 at 5:47 AM  [facility]   From 7 PM-7 AM, contact night-coverage [email or web address]  CC: Primary care physician; Patient, No Pcp Per

## 2020-01-18 ENCOUNTER — Emergency Department
Admission: EM | Admit: 2020-01-18 | Discharge: 2020-01-18 | Disposition: A | Discharge status: Home / Self Care | Payer: Self-pay | Attending: Emergency Medicine | Admitting: Emergency Medicine

## 2020-01-18 ENCOUNTER — Encounter: Payer: Self-pay | Admitting: Intensive Care

## 2020-01-18 ENCOUNTER — Other Ambulatory Visit: Payer: Self-pay

## 2020-01-18 DIAGNOSIS — K0889 Other specified disorders of teeth and supporting structures: Secondary | ICD-10-CM | POA: Insufficient documentation

## 2020-01-18 DIAGNOSIS — R079 Chest pain, unspecified: Secondary | ICD-10-CM

## 2020-01-18 DIAGNOSIS — Z87891 Personal history of nicotine dependence: Secondary | ICD-10-CM | POA: Insufficient documentation

## 2020-01-18 MED ORDER — PENICILLIN V POTASSIUM 500 MG PO TABS
250.0000 mg | ORAL_TABLET | Freq: Once | ORAL | Status: AC
  Administered 2020-01-18: 250 mg via ORAL
  Filled 2020-01-18: qty 1

## 2020-01-18 MED ORDER — NAPROXEN 500 MG PO TABS: 500.0000 mg | ORAL_TABLET | Freq: Two times a day (BID) | ORAL | 2 refills | Status: DC

## 2020-01-18 MED ORDER — NAPROXEN 500 MG PO TABS
500.0000 mg | ORAL_TABLET | Freq: Once | ORAL | Status: AC
  Administered 2020-01-18: 500 mg via ORAL
  Filled 2020-01-18: qty 1

## 2020-01-18 MED ORDER — PENICILLIN V POTASSIUM 250 MG PO TABS: 250.0000 mg | ORAL_TABLET | Freq: Four times a day (QID) | ORAL | 0 refills | Status: DC

## 2020-01-18 NOTE — ED Triage Notes (Signed)
Patient c/o right upper dental pain. C/o pain starting a couple of days ago with right facial soreness

## 2020-01-18 NOTE — ED Provider Notes (Signed)
[facility] Emergency Department Provider Note   ____________________________________________    I have reviewed the triage vital signs and the nursing notes.   HISTORY  Chief Complaint Dental Pain     HPI Roberto Stanton is a 24 y.o. male who presents with complaints of dental pain.  Patient describes right-sided superior posterior dental pain x2 days.  He reports some discomfort in his cheek.  No fevers reported.  No swelling under the tongue.  No difficulty swallowing or throat pain.  Past Medical History:  Diagnosis Date  . Kidney stones   . Pericarditis     Patient Active Problem List   Diagnosis Date Noted  . Acute respiratory failure (HCC) 01/08/2020    History reviewed. No pertinent surgical history.  Prior to Admission medications   Medication Sig Start Date End Date Taking? Authorizing Provider  acetaminophen (TYLENOL) 500 MG tablet Take 500 mg by mouth every 6 (six) hours as needed for mild pain.    [provider]  busPIRone (BUSPAR) 10 MG tablet Take 1 tablet (10 mg total) by mouth 3 (three) times daily. 01/08/20   Rhetta Mura, MD  ibuprofen (ADVIL) 200 MG tablet Take 3 tablets (600 mg total) by mouth every 6 (six) hours as needed for moderate pain. 01/08/20   Rhetta Mura, MD  Multiple Vitamin (MULTIVITAMIN) tablet Take 1 tablet by mouth daily. Taking 1 tablet daily    [provider]  naproxen (NAPROSYN) 500 MG tablet Take 1 tablet (500 mg total) by mouth 2 (two) times daily with a meal. 01/18/20   Jene Every, MD  penicillin v potassium (VEETID) 250 MG tablet Take 1 tablet (250 mg total) by mouth 4 (four) times daily. 01/18/20   Jene Every, MD  traZODone (DESYREL) 50 MG tablet Take 1 tablet (50 mg total) by mouth at bedtime. 01/08/20   Rhetta Mura, MD     Allergies Patient has no known allergies.  History reviewed. No pertinent family history.  Social History Social  History   Tobacco Use  . Smoking status: Former Games developer  . Smokeless tobacco: Never Used  Vaping Use  . Vaping Use: Every day  . Substances: Nicotine  Substance Use Topics  . Alcohol use: Yes    Comment: occasional  . Drug use: Yes    Types: Marijuana    Review of Systems  Constitutional: No fever/chills  ENT: As above   Gastrointestinal:No nausea, no vomiting.    Skin: Negative for rash. Neurological: Negative for headaches     ____________________________________________   PHYSICAL EXAM:  VITAL SIGNS: ED Triage Vitals  Enc Vitals Group     BP 01/18/20 1257 133/81     Pulse Rate 01/18/20 1257 89     Resp 01/18/20 1257 18     Temp 01/18/20 1257 98 F (36.7 C)     Temp Source 01/18/20 1257 Oral     SpO2 01/18/20 1257 99 %     Weight 01/18/20 1257 61.2 kg (135 lb)     Height 01/18/20 1257 1.753 m (5\' 9" )     Head Circumference --      Peak Flow --      Pain Score 01/18/20 1302 5     Pain Loc --      Pain Edu? --      Excl. in GC? --     Constitutional: Alert and oriented. No acute distress. Pleasant and interactive Eyes: Conjunctivae are normal.  Head: Atraumatic. Nose: No congestion/rhinnorhea.  Mouth/Throat: Mucous membranes are moist.  Poor dentition noted, significant tooth decay posterior right upper molars, no abscess, mild erythema of the gums, normal pharynx Cardiovascular: Normal rate, regular rhythm.  Respiratory: Normal respiratory effort.  No retractions.  Musculoskeletal: No lower extremity tenderness nor edema.   Neurologic:  Normal speech and language. No gross focal neurologic deficits are appreciated.   Skin:  Skin is warm, dry and intact. No rash noted.   ____________________________________________   LABS (all labs ordered are listed, but only abnormal results are displayed)  Labs Reviewed - No data to  display ____________________________________________  EKG   ____________________________________________  RADIOLOGY   ____________________________________________   PROCEDURES  Procedure(s) performed: No  Procedures   Critical Care performed: No ____________________________________________   INITIAL IMPRESSION / ASSESSMENT AND PLAN / ED COURSE  Pertinent labs & imaging results that were available during my care of the patient were reviewed by me and considered in my medical decision making (see chart for details).  Patient presents with dental pain as described above likely related to mild infection secondary to dental decay.  Will start on penicillin, analgesic, outpatient follow-up with dentist recommended   ____________________________________________   FINAL CLINICAL IMPRESSION(S) / ED DIAGNOSES  Final diagnoses:  Pain, dental      NEW MEDICATIONS STARTED DURING THIS VISIT:  Discharge Medication List as of 01/18/2020  1:47 PM    START taking these medications   Details  naproxen (NAPROSYN) 500 MG tablet Take 1 tablet (500 mg total) by mouth 2 (two) times daily with a meal., Starting Mon 01/18/2020, Normal    penicillin v potassium (VEETID) 250 MG tablet Take 1 tablet (250 mg total) by mouth 4 (four) times daily., Starting Mon 01/18/2020, Normal         Note:  This document was prepared using Dragon voice recognition software and may include unintentional dictation errors.   Jene Every, MD 01/18/20 1530

## 2020-01-29 ENCOUNTER — Inpatient Hospital Stay: Payer: Self-pay | Admitting: Cardiothoracic Surgery

## 2020-02-19 ENCOUNTER — Emergency Department
Admission: EM | Admit: 2020-02-19 | Discharge: 2020-02-19 | Disposition: A | Discharge status: Home / Self Care | Payer: Medicaid Other | Attending: Emergency Medicine | Admitting: Emergency Medicine

## 2020-02-19 ENCOUNTER — Emergency Department: Payer: Medicaid Other

## 2020-02-19 ENCOUNTER — Encounter: Payer: Self-pay | Admitting: Radiology

## 2020-02-19 DIAGNOSIS — Z87891 Personal history of nicotine dependence: Secondary | ICD-10-CM | POA: Insufficient documentation

## 2020-02-19 DIAGNOSIS — R0789 Other chest pain: Secondary | ICD-10-CM

## 2020-02-19 LAB — CBC
HCT: 45.2 % (ref 39.0–52.0)
Hemoglobin: 15.6 g/dL (ref 13.0–17.0)
MCH: 32.3 pg (ref 26.0–34.0)
MCHC: 34.5 g/dL (ref 30.0–36.0)
MCV: 93.6 fL (ref 80.0–100.0)
Platelets: 201 10*3/uL (ref 150–400)
RBC: 4.83 MIL/uL (ref 4.22–5.81)
RDW: 12.4 % (ref 11.5–15.5)
WBC: 6.9 10*3/uL (ref 4.0–10.5)
nRBC: 0 % (ref 0.0–0.2)

## 2020-02-19 LAB — BASIC METABOLIC PANEL
Anion gap: 10 (ref 5–15)
BUN: 15 mg/dL (ref 6–20)
CO2: 28 mmol/L (ref 22–32)
Calcium: 9.3 mg/dL (ref 8.9–10.3)
Chloride: 99 mmol/L (ref 98–111)
Creatinine, Ser: 0.86 mg/dL (ref 0.61–1.24)
GFR, Estimated: >60 mL/min (ref >60)
Glucose, Bld: 108 mg/dL — ABNORMAL HIGH (ref 70–99)
Potassium: 3.8 mmol/L (ref 3.5–5.1)
Sodium: 137 mmol/L (ref 135–145)

## 2020-02-19 LAB — TROPONIN I (HIGH SENSITIVITY)
Troponin I (High Sensitivity): 3 ng/L (ref <18)
Troponin I (High Sensitivity): 3 ng/L (ref <18)

## 2020-02-19 LAB — CK: Total CK: 1030 U/L — ABNORMAL HIGH (ref 49–397)

## 2020-02-19 IMAGING — CT CT ANGIO CHEST
2 of 6 series · 17 of 46 positions shown · IV contrast (APPLIED)
Comparison: [DATE]
COMPARISON: [DATE]

Addendum:
CLINICAL DATA: Pulmonary embolus suspected with high probability.
Chest pain for 1 month with fluid in the chest on the left side.
History of rhabdomyolysis. Scheduled for fluid drainage on [REDACTED].

EXAM:
CT ANGIOGRAPHY CHEST WITH CONTRAST
TECHNIQUE: Multidetector CT imaging of the chest was performed using the
standard protocol during bolus administration of intravenous
contrast. Multiplanar CT image reconstructions and MIPs were
obtained to evaluate the vascular anatomy.
CONTRAST:  75mL OMNIPAQUE IOHEXOL 350 MG/ML SOLN

[Series 5: thins · axial · 0.57mm/px · z∈[-549,-276]mm · 14 of 301 slices shown]
[im 14/301  lung]
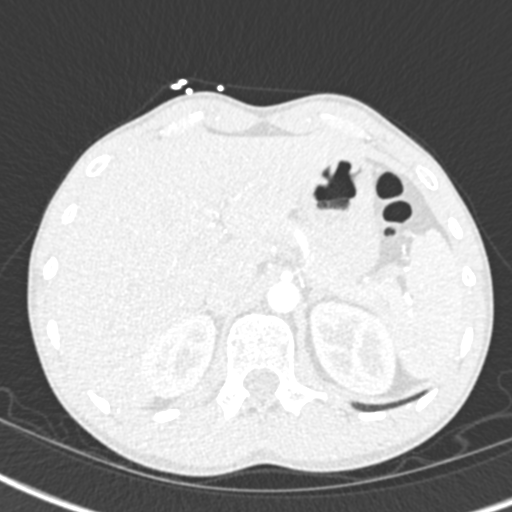
[im 40/301  soft-tissue]
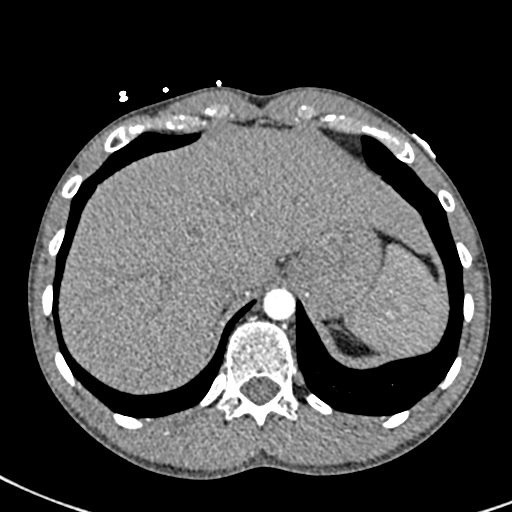
[im 53/301  lung]
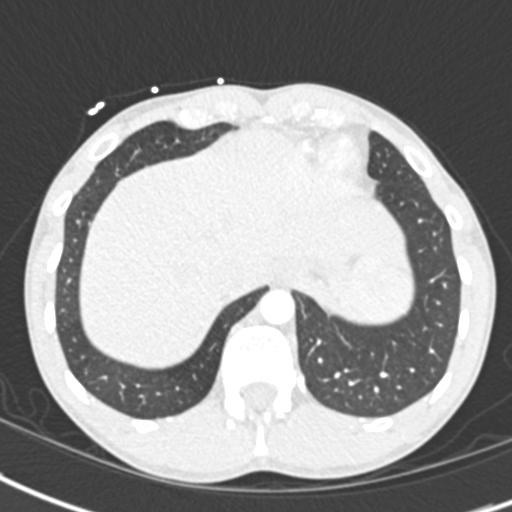
[im 79/301  soft-tissue]
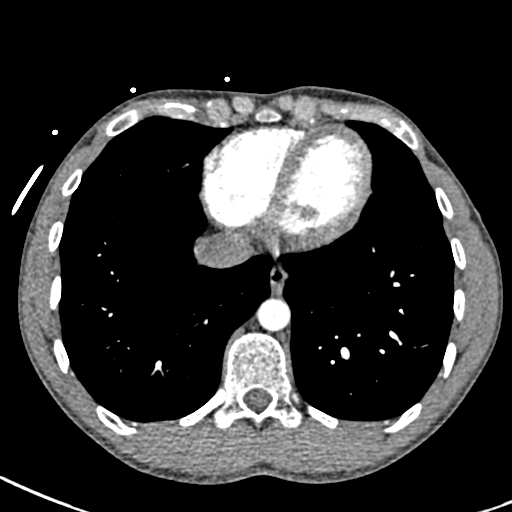
[im 105/301  lung]
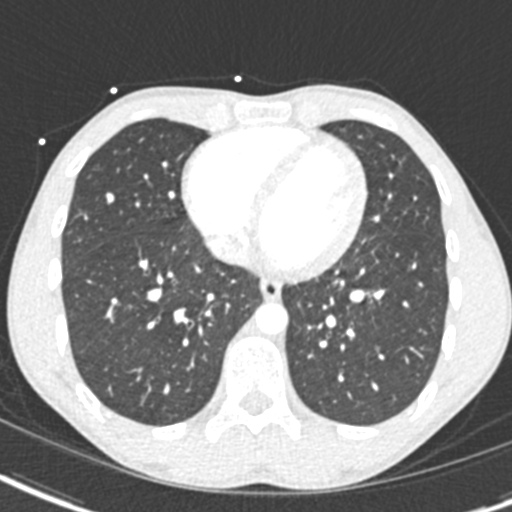
[im 118/301  soft-tissue]
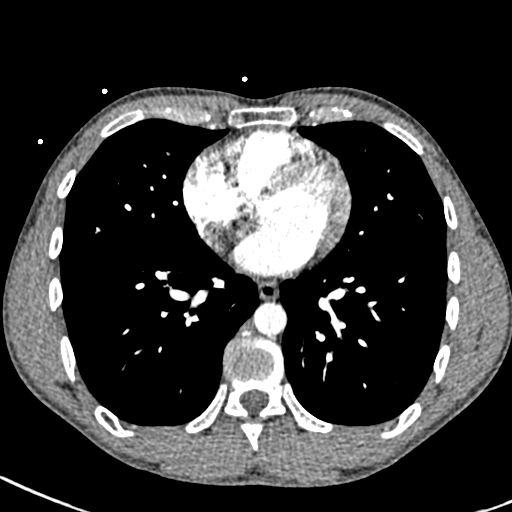
[im 144/301  lung]
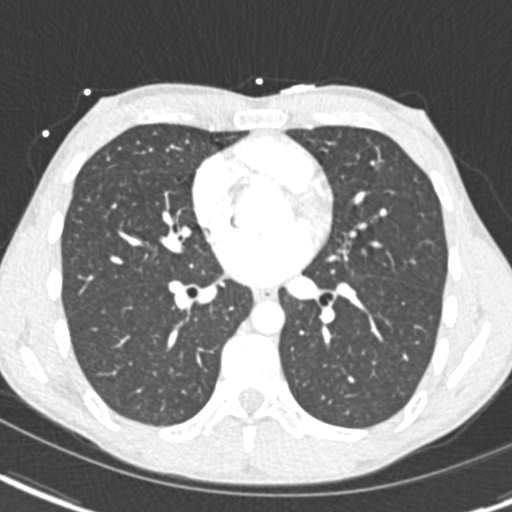
[im 157/301  soft-tissue]
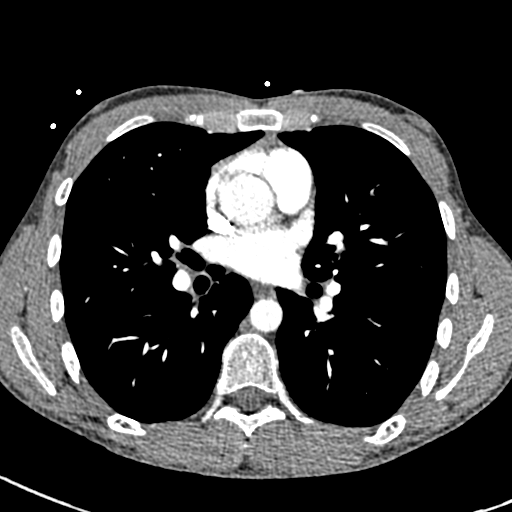
[im 183/301  lung]
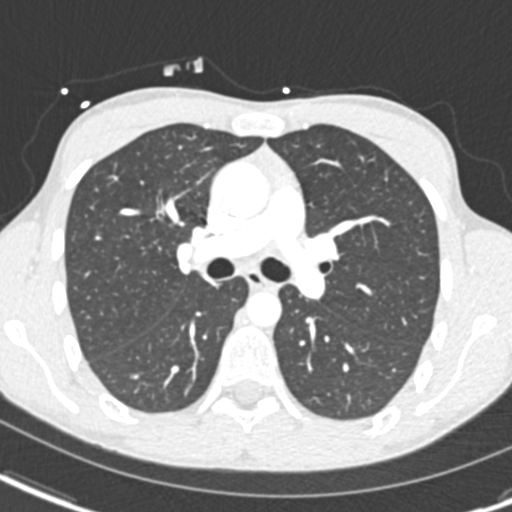
[im 196/301  soft-tissue]
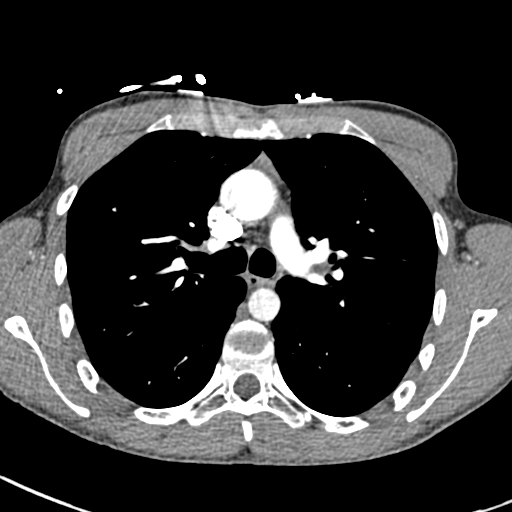
[im 222/301  lung]
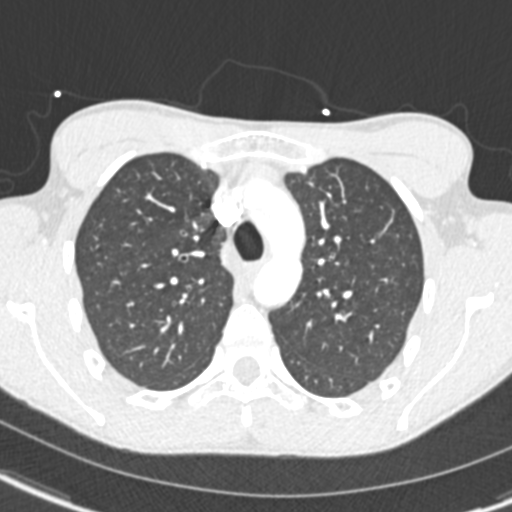
[im 248/301  soft-tissue]
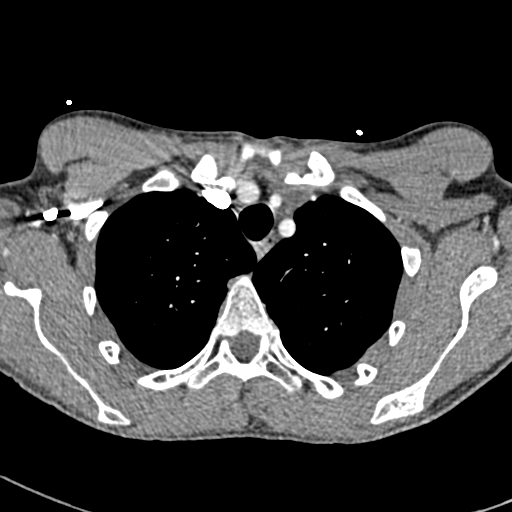
[im 261/301  lung]
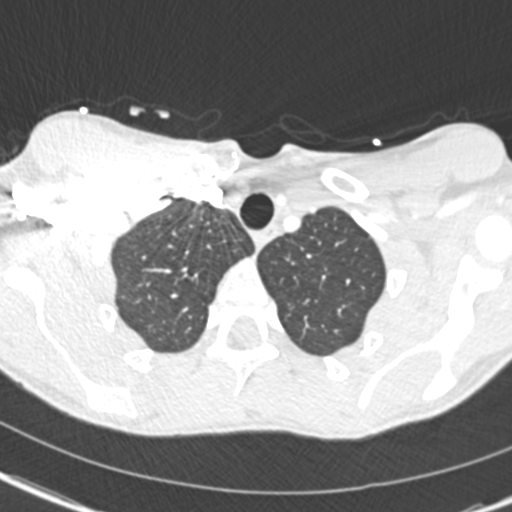
[im 287/301  soft-tissue]
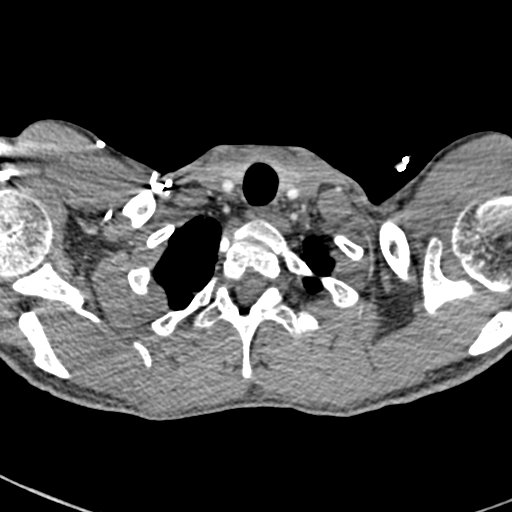

[Series 7: coronal mpr · coronal · 0.52mm/px · 3 of 75 slices shown]
[im 19/75  soft-tissue]
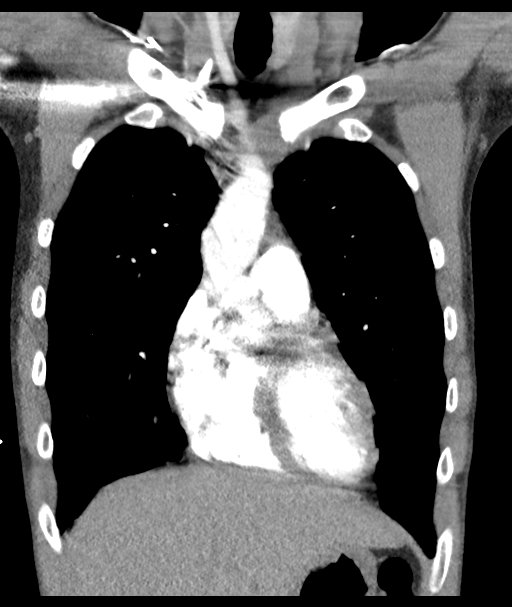
[im 38/75  soft-tissue]
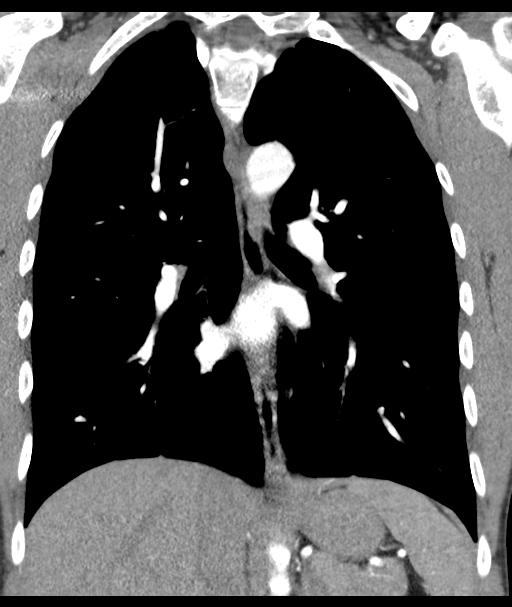
[im 56/75  soft-tissue]
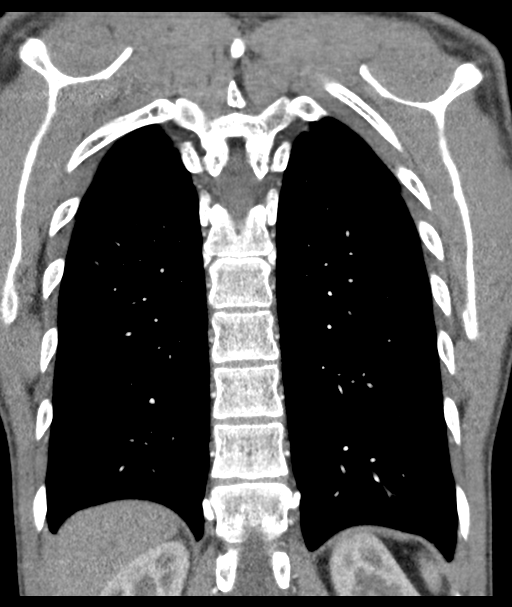

[17 of 46 positions shown; findings below may reference images not displayed]

FINDINGS: Cardiovascular: There is good opacification of the central and
segmental pulmonary arteries. No focal filling defects. No evidence
of significant pulmonary embolus. Normal heart size. No pericardial
effusions. Ectatic ascending thoracic aorta, AP diameter measuring
3.2 cm. No aortic dissection. Great vessel origins are patent.

Mediastinum/Nodes: Esophagus is decompressed. No significant
lymphadenopathy.

Lungs/Pleura: Lungs are clear. No airspace disease or consolidation.
No pleural effusions. No pneumothorax.

Upper Abdomen: No acute abnormalities demonstrated in the visualized
upper abdomen.

Musculoskeletal: No chest wall abnormality. No acute or significant
osseous findings.

Review of the MIP images confirms the above findings.
IMPRESSION: 1. No evidence of significant pulmonary embolus.
2. No evidence of active pulmonary disease.
3. Ectatic ascending thoracic aorta, AP diameter measuring 3.2 cm.

ADDENDUM:
Increased density in the anterior mediastinum is again demonstrated,
consistent with residual thymic tissue. No change in appearance
since previous study.

*** End of Addendum ***
FINDINGS: Cardiovascular: There is good opacification of the central and
segmental pulmonary arteries. No focal filling defects. No evidence
of significant pulmonary embolus. Normal heart size. No pericardial
effusions. Ectatic ascending thoracic aorta, AP diameter measuring
3.2 cm. No aortic dissection. Great vessel origins are patent.

Mediastinum/Nodes: Esophagus is decompressed. No significant
lymphadenopathy.

Lungs/Pleura: Lungs are clear. No airspace disease or consolidation.
No pleural effusions. No pneumothorax.

Upper Abdomen: No acute abnormalities demonstrated in the visualized
upper abdomen.

Musculoskeletal: No chest wall abnormality. No acute or significant
osseous findings.

Review of the MIP images confirms the above findings.
IMPRESSION: 1. No evidence of significant pulmonary embolus.
2. No evidence of active pulmonary disease.
3. Ectatic ascending thoracic aorta, AP diameter measuring 3.2 cm.

## 2020-02-19 MED ORDER — IOHEXOL 350 MG/ML SOLN
75.0000 mL | Freq: Once | INTRAVENOUS | Status: AC | PRN
  Administered 2020-02-19: 75 mL via INTRAVENOUS

## 2020-02-19 MED ORDER — KETOROLAC TROMETHAMINE 30 MG/ML IJ SOLN
15.0000 mg | Freq: Once | INTRAMUSCULAR | Status: AC
  Administered 2020-02-19: 15 mg via INTRAVENOUS
  Filled 2020-02-19: qty 1

## 2020-02-19 MED ORDER — OXYCODONE HCL 5 MG PO TABS
5.0000 mg | ORAL_TABLET | Freq: Once | ORAL | Status: AC
  Administered 2020-02-19: 5 mg via ORAL
  Filled 2020-02-19: qty 1

## 2020-02-19 MED ORDER — IBUPROFEN 600 MG PO TABS: 600.0000 mg | ORAL_TABLET | Freq: Three times a day (TID) | ORAL | 0 refills | Status: AC

## 2020-02-19 MED ORDER — SODIUM CHLORIDE 0.9 % IV BOLUS
1000.0000 mL | Freq: Once | INTRAVENOUS | Status: AC
  Administered 2020-02-19: 1000 mL via INTRAVENOUS

## 2020-02-19 MED ORDER — HYDROCODONE-ACETAMINOPHEN 5-325 MG PO TABS
2.0000 | ORAL_TABLET | Freq: Once | ORAL | Status: AC
  Administered 2020-02-19: 2 via ORAL
  Filled 2020-02-19: qty 2

## 2020-02-19 MED ORDER — HYDROCODONE-ACETAMINOPHEN 5-325 MG PO TABS
1.0000 | ORAL_TABLET | Freq: Four times a day (QID) | ORAL | 0 refills | Status: AC | PRN
Start: 2020-02-19 — End: 2021-02-18

## 2020-02-19 NOTE — ED Notes (Signed)
Patient transported to CT 

## 2020-02-19 NOTE — ED Notes (Signed)
Patient transported to X-ray 

## 2020-02-19 NOTE — Discharge Instructions (Addendum)
As we discussed:  Take the ibuprofen every 8 hours for a week. Take this with food.  Take the hydrocodone for severe pain.  Drink at least 6-8 glasses of water daily.  Try to minimize any heavy exercise/physical activity for the next week

## 2020-02-19 NOTE — ED Triage Notes (Signed)
Pt presents via POV c/o chest pain for a month with "fluid in chest" on left side. Reports hx rhabdomyolysis. Reports apt to see MD to have fluid drained from chest on Monday per pt report.

## 2020-02-19 NOTE — ED Notes (Signed)
Patient in hallway stretcher, patient placed on cardiac monitor via dinamap d/t c/o CP. Patient states he has chest pressure on the left side that started yesterday. He states that he has had this problem before, and is scheduled for a CT on Monday but "the pain and weakness has increased".

## 2020-02-19 NOTE — ED Provider Notes (Signed)
[facility] Emergency Department Provider Note  ____________________________________________   Event Date/Time   First MD Initiated Contact with Patient 02/19/20 1953     (approximate)  I have reviewed the triage vital signs and the nursing notes.   HISTORY  Chief Complaint Chest Pain    HPI Roberto Stanton is a 24 y.o. male  With h/o pericarditis here with recurrent chest pain. Pt reports that he has had ongoing, recurrent chest pain since may, when he was diagnosed with pericarditis. Reports they believed this was due to an infection that came from his tooth.  Since then, he has been seen multiple times for recurrent chest pain.  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  He works Youth worker.  Reports that he is here today because for the last 2 days, he has had increasingly severe sharp, stabbing, left-sided chest pain.  This pain is worse with inspiration and movement.  It does feel similar to his previous chest pain although more severe.  He states he becomes anxious due to this and has been more anxious.  Denies any significant shortness of breath.  No cough.  No fevers or chills.  He does note some mild body aches with exertion when he is working, but this has not been particularly any worse than it has been recently.  No other complaints.  No recent sick contacts.       Past Medical History:  Diagnosis Date  . Kidney stones   . Pericarditis     Patient Active Problem List   Diagnosis Date Noted  . Acute respiratory failure (HCC) 01/08/2020    No past surgical history on file.  Prior to Admission medications   Medication Sig Start Date End Date Taking? Authorizing Provider  HYDROcodone-acetaminophen (NORCO/VICODIN) 5-325 MG tablet Take 1-2 tablets by mouth every 6 (six) hours as needed for moderate pain or severe pain. 02/19/20 02/18/21 Yes Shaune Pollack, MD  ibuprofen  (ADVIL) 600 MG tablet Take 1 tablet (600 mg total) by mouth 3 (three) times daily for 7 days. 02/19/20 02/26/20 Yes Shaune Pollack, MD  busPIRone (BUSPAR) 10 MG tablet Take 1 tablet (10 mg total) by mouth 3 (three) times daily. 01/08/20   Rhetta Mura, MD  Multiple Vitamin (MULTIVITAMIN) tablet Take 1 tablet by mouth daily. Taking 1 tablet daily    [provider]  penicillin v potassium (VEETID) 250 MG tablet Take 1 tablet (250 mg total) by mouth 4 (four) times daily. 01/18/20   Jene Every, MD  traZODone (DESYREL) 50 MG tablet Take 1 tablet (50 mg total) by mouth at bedtime. 01/08/20   Rhetta Mura, MD    Allergies Patient has no known allergies.  No family history on file.  Social History Social History   Tobacco Use  . Smoking status: Former Games developer  . Smokeless tobacco: Never Used  Vaping Use  . Vaping Use: Every day  . Substances: Nicotine  Substance Use Topics  . Alcohol use: Yes    Comment: occasional  . Drug use: Yes    Types: Marijuana    Review of Systems  Review of Systems  Constitutional: Negative for chills, fatigue and fever.  HENT: Negative for sore throat.   Respiratory: Positive for chest tightness. Negative for shortness of breath.   Cardiovascular: Positive for chest pain.  Gastrointestinal: Negative for abdominal pain.  Genitourinary: Negative for flank pain.  Musculoskeletal: Negative for neck pain.  Skin:  Negative for rash and wound.  Allergic/Immunologic: Negative for immunocompromised state.  Neurological: Negative for weakness and numbness.  Hematological: Does not bruise/bleed easily.  All other systems reviewed and are negative.    ____________________________________________  PHYSICAL EXAM:      VITAL SIGNS: ED Triage Vitals [02/19/20 1814]  Enc Vitals Group     BP (!) 147/97     Pulse Rate 96     Resp 16     Temp      Temp src      SpO2 100 %     Weight      Height      Head Circumference      Peak  Flow      Pain Score 5     Pain Loc      Pain Edu?      Excl. in GC?      Physical Exam Vitals and nursing note reviewed.  Constitutional:      General: He is not in acute distress.    Appearance: He is well-developed.  HENT:     Head: Normocephalic and atraumatic.  Eyes:     Conjunctiva/sclera: Conjunctivae normal.  Cardiovascular:     Rate and Rhythm: Normal rate and regular rhythm.     Heart sounds: Normal heart sounds. No murmur heard. No friction rub.  Pulmonary:     Effort: Pulmonary effort is normal. No respiratory distress.     Breath sounds: Normal breath sounds. No wheezing or rales.  Chest:     Comments: Moderate tenderness to palpation of the left anterior chest wall.  No bruising or deformity.  No masses or skin lesions. Abdominal:     General: There is no distension.     Palpations: Abdomen is soft.     Tenderness: There is no abdominal tenderness.  Musculoskeletal:     Cervical back: Neck supple.  Skin:    General: Skin is warm.     Capillary Refill: Capillary refill takes less than 2 seconds.  Neurological:     Mental Status: He is alert and oriented to person, place, and time.     Motor: No abnormal muscle tone.       ____________________________________________   LABS (all labs ordered are listed, but only abnormal results are displayed)  Labs Reviewed  BASIC METABOLIC PANEL - Abnormal; Notable for the following components:      Result Value   Glucose, Bld 108 (*)    All other components within normal limits  CK - Abnormal; Notable for the following components:   Total CK 1,030 (*)    All other components within normal limits  CBC  TROPONIN I (HIGH SENSITIVITY)  TROPONIN I (HIGH SENSITIVITY)    ____________________________________________  EKG: Normal sinus rhythm, ventricular rate 97.  PR 132, QRS 100, QTc 436.  No acute ST elevations or depressions.  EKG evidence of acute ischemia or  infarct. ________________________________________  RADIOLOGY All imaging, including plain films, CT scans, and ultrasounds, independently reviewed by me, and interpretations confirmed via formal radiology reads.  ED MD interpretation:   Chest x-ray: Clear, no active disease  Official radiology report(s): DG Chest 2 View  Result Date: 02/19/2020 CLINICAL DATA:  Chest pain for a month. Fluid in the chest on the left side. History of rhabdomyolysis. EXAM: CHEST - 2 VIEW COMPARISON:  01/07/2020 FINDINGS: The heart size and mediastinal contours are within normal limits. Both lungs are clear. The visualized skeletal structures are unremarkable. IMPRESSION: No active cardiopulmonary  disease. Electronically Signed   By: Burman Nieves M.D.   On: 02/19/2020 18:41   CT Angio Chest PE W and/or Wo Contrast  Result Date: 02/19/2020 CLINICAL DATA:  Pulmonary embolus suspected with high probability. Chest pain for 1 month with fluid in the chest on the left side. History of rhabdomyolysis. Scheduled for fluid drainage on Monday. EXAM: CT ANGIOGRAPHY CHEST WITH CONTRAST TECHNIQUE: Multidetector CT imaging of the chest was performed using the standard protocol during bolus administration of intravenous contrast. Multiplanar CT image reconstructions and MIPs were obtained to evaluate the vascular anatomy. CONTRAST:  54mL OMNIPAQUE IOHEXOL 350 MG/ML SOLN COMPARISON:  01/07/2020 FINDINGS: Cardiovascular: There is good opacification of the central and segmental pulmonary arteries. No focal filling defects. No evidence of significant pulmonary embolus. Normal heart size. No pericardial effusions. Ectatic ascending thoracic aorta, AP diameter measuring 3.2 cm. No aortic dissection. Great vessel origins are patent. Mediastinum/Nodes: Esophagus is decompressed. No significant lymphadenopathy. Lungs/Pleura: Lungs are clear. No airspace disease or consolidation. No pleural effusions. No pneumothorax. Upper Abdomen: No  acute abnormalities demonstrated in the visualized upper abdomen. Musculoskeletal: No chest wall abnormality. No acute or significant osseous findings. Review of the MIP images confirms the above findings. IMPRESSION: 1. No evidence of significant pulmonary embolus. 2. No evidence of active pulmonary disease. 3. Ectatic ascending thoracic aorta, AP diameter measuring 3.2 cm. Electronically Signed   By: Burman Nieves M.D.   On: 02/19/2020 20:44    ____________________________________________  PROCEDURES   Procedure(s) performed (including Critical Care):  Procedures  ____________________________________________  INITIAL IMPRESSION / MDM / ASSESSMENT AND PLAN / ED COURSE  As part of my medical decision making, I reviewed the following data within the electronic MEDICAL RECORD NUMBER Nursing notes reviewed and incorporated, Old chart reviewed, Notes from prior ED visits, and [place] Controlled Substance Database       *MAKEL MCMANN was evaluated in Emergency Department on 02/19/2020 for the symptoms described in the history of present illness. He was evaluated in the context of the global COVID-19 pandemic, which necessitated consideration that the patient might be at risk for infection with the SARS-CoV-2 virus that causes COVID-19. Institutional protocols and algorithms that pertain to the evaluation of patients at risk for COVID-19 are in a state of rapid change based on information released by regulatory bodies including the CDC and federal and state organizations. These policies and algorithms were followed during the patient's care in the ED.  Some ED evaluations and interventions may be delayed as a result of limited staffing during the pandemic.*     Medical Decision Making: 24 year old male here with recurrent left-sided chest pain.  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  His EKG is nonischemic.  Troponin is negative.  EKG shows  no evidence of arrhythmia.  Given his recurrent symptoms as well as reported question of pericardial effusion, a CT scan was repeated and reviewed by me, shows no evidence of pericardial or pleural effusion.  No evidence of PE.  His lab work is otherwise reassuring.  Will treat for possible ongoing musculoskeletal chest pain versus recurrent pericarditis pain.  CK 1000, and this seems to be somewhat near his baseline.  He does have an interesting component of recurrent rhabdomyolysis but no evidence to suggest significant AKI or other complications at this time.  He was given 2 L of fluid.  Do not feel he needs admission from the standpoint.  Will advise him to decrease his exertion at work/limit  exercise until further cleared by outpatient work-up.  _______________________________________  FINAL CLINICAL IMPRESSION(S) / ED DIAGNOSES  Final diagnoses:  Atypical chest pain     MEDICATIONS GIVEN DURING THIS VISIT:  Medications  ketorolac (TORADOL) 30 MG/ML injection 15 mg (15 mg Intravenous Given 02/19/20 2024)  HYDROcodone-acetaminophen (NORCO/VICODIN) 5-325 MG per tablet 2 tablet (2 tablets Oral Given 02/19/20 2024)  iohexol (OMNIPAQUE) 350 MG/ML injection 75 mL (75 mLs Intravenous Contrast Given 02/19/20 2034)  sodium chloride 0.9 % bolus 1,000 mL (1,000 mLs Intravenous New Bag/Given 02/19/20 2111)  sodium chloride 0.9 % bolus 1,000 mL (1,000 mLs Intravenous New Bag/Given 02/19/20 2111)     ED Discharge Orders         Ordered    ibuprofen (ADVIL) 600 MG tablet  3 times daily        02/19/20 2224    HYDROcodone-acetaminophen (NORCO/VICODIN) 5-325 MG tablet  Every 6 hours PRN        02/19/20 2224           Note:  This document was prepared using Dragon voice recognition software and may include unintentional dictation errors.   Shaune PollackIsaacs, Shundra Wirsing, MD 02/19/20 2231

## 2020-02-22 ENCOUNTER — Telehealth: Payer: Self-pay

## 2020-02-22 ENCOUNTER — Inpatient Hospital Stay: Payer: Self-pay | Admitting: Cardiothoracic Surgery

## 2020-02-22 DIAGNOSIS — R079 Chest pain, unspecified: Secondary | ICD-10-CM

## 2020-02-22 NOTE — Telephone Encounter (Signed)
Spoke with [facility] referral coordinator at Dr.Gearhardt office- she will look at the referral and notes and determine if patient needs to be seen -she will call me to let me know.

## 2020-02-22 NOTE — Telephone Encounter (Signed)
Urgent referral placed to Dr.Edward Gearhardt at this time-notes from ED faxed as well-  left message for [facility] referral coordinator to call so we can try to get the patient seen as soon as possible. Patient can be reached at [phone].

## 2020-02-24 ENCOUNTER — Telehealth: Payer: Self-pay

## 2020-02-24 ENCOUNTER — Emergency Department
Admission: EM | Admit: 2020-02-24 | Discharge: 2020-02-25 | Disposition: A | Discharge status: Home / Self Care | Payer: Medicaid Other | Attending: Emergency Medicine | Admitting: Emergency Medicine

## 2020-02-24 ENCOUNTER — Emergency Department: Payer: Medicaid Other

## 2020-02-24 ENCOUNTER — Encounter: Payer: Self-pay | Admitting: Emergency Medicine

## 2020-02-24 ENCOUNTER — Telehealth: Payer: Self-pay | Admitting: Cardiothoracic Surgery

## 2020-02-24 ENCOUNTER — Other Ambulatory Visit: Payer: Self-pay

## 2020-02-24 DIAGNOSIS — R0789 Other chest pain: Secondary | ICD-10-CM

## 2020-02-24 DIAGNOSIS — R42 Dizziness and giddiness: Secondary | ICD-10-CM | POA: Insufficient documentation

## 2020-02-24 DIAGNOSIS — R079 Chest pain, unspecified: Secondary | ICD-10-CM

## 2020-02-24 DIAGNOSIS — M545 Low back pain, unspecified: Secondary | ICD-10-CM | POA: Insufficient documentation

## 2020-02-24 DIAGNOSIS — Z5321 Procedure and treatment not carried out due to patient leaving prior to being seen by health care provider: Secondary | ICD-10-CM | POA: Insufficient documentation

## 2020-02-24 HISTORY — DX: Rhabdomyolysis: M62.82

## 2020-02-24 LAB — BASIC METABOLIC PANEL
Anion gap: 12 (ref 5–15)
BUN: 17 mg/dL (ref 6–20)
CO2: 29 mmol/L (ref 22–32)
Calcium: 9.4 mg/dL (ref 8.9–10.3)
Chloride: 98 mmol/L (ref 98–111)
Creatinine, Ser: 0.81 mg/dL (ref 0.61–1.24)
GFR, Estimated: >60 mL/min (ref >60)
Glucose, Bld: 87 mg/dL (ref 70–99)
Potassium: 3.6 mmol/L (ref 3.5–5.1)
Sodium: 139 mmol/L (ref 135–145)

## 2020-02-24 LAB — CBC
HCT: 45.5 % (ref 39.0–52.0)
Hemoglobin: 16.1 g/dL (ref 13.0–17.0)
MCH: 32.5 pg (ref 26.0–34.0)
MCHC: 35.4 g/dL (ref 30.0–36.0)
MCV: 91.7 fL (ref 80.0–100.0)
Platelets: 215 10*3/uL (ref 150–400)
RBC: 4.96 MIL/uL (ref 4.22–5.81)
RDW: 12.3 % (ref 11.5–15.5)
WBC: 9.9 10*3/uL (ref 4.0–10.5)
nRBC: 0 % (ref 0.0–0.2)

## 2020-02-24 LAB — TROPONIN I (HIGH SENSITIVITY): Troponin I (High Sensitivity): 4 ng/L (ref <18)

## 2020-02-24 NOTE — Telephone Encounter (Signed)
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 I have notified the patient to let him know I am still awaiting a call from Dr.Gearhardt's office as to scheduling an appointment.

## 2020-02-24 NOTE — ED Triage Notes (Signed)
Pt to ED from home c/o chest pain that he has been seen for.  States supposed to have surgery to "remove dead tissue in chest" but his surgery keeps getting moved back d/t surgeon's schedule.  Pt hoping to have MRI done which was recommended to him.  Also states some back pain and dizziness when walking.  Ambulatory to triage steady gait, chest rise even and unlabored, skin WNL, in NAD at this time.

## 2020-02-24 NOTE — Telephone Encounter (Signed)
Patient is calling in regards to the referral we sent over, while Dr. Thelma Barge is still out of the office, and is asking if you could give him a call. Please call patient and advise.

## 2020-02-25 ENCOUNTER — Telehealth: Payer: Self-pay

## 2020-02-25 NOTE — Telephone Encounter (Signed)
Spoke with patient to let him know the referral has been faxed to [facility] Thoracic Dr.Thomas D'Amico. Once his office reviews the records I faxed they will reach out.

## 2020-02-25 NOTE — Telephone Encounter (Signed)
Referral to thoracic surgeon DR.Denyse Dago at [facility] -fax number [phone]-office notes, CT scan and demographic information faxed.   Left message for patient to return call to office so I can let him know I have faxed the referral. Their office will review and reach out to the patient if an appointment is necessary.

## 2020-02-29 ENCOUNTER — Telehealth: Payer: Self-pay | Admitting: Cardiothoracic Surgery

## 2020-02-29 NOTE — Telephone Encounter (Signed)
Patient is calling and is asking if you would give him a call when you have a chance said he's been talking to you. Please call patient and advise.

## 2020-02-29 NOTE — Telephone Encounter (Signed)
Spoke with patient and he was provided number to call Dr.D'Amico office to see if they have completed review of his medical records and schedule an appointment.

## 2020-03-01 ENCOUNTER — Telehealth: Payer: Self-pay

## 2020-03-01 ENCOUNTER — Telehealth: Payer: Self-pay | Admitting: *Deleted

## 2020-03-01 NOTE — Telephone Encounter (Signed)
Left message for patient to call back and confirm appointment.    Roberto Stanton from [employer] called in regards to this patient. She said patient has an appointment with Dr.Jacob Klapper on 04/05/20 at 2pm.

## 2020-03-01 NOTE — Telephone Encounter (Signed)
Martie Lee from [employer] called in regards to this patient. She said patient has an appointment with Dr.Jacob Klapper on 04/05/20 at 2pm.

## 2020-03-24 ENCOUNTER — Telehealth: Payer: Self-pay | Admitting: Cardiothoracic Surgery

## 2020-03-24 NOTE — Telephone Encounter (Signed)
[facility] appointment 04/05/20 @ 2 pm Dr.Jacob Klapper.

## 2020-03-24 NOTE — Telephone Encounter (Signed)
Patient is calling and is asking if you would give him a call when you had a chance. Please call patient and advise.

## 2020-03-24 NOTE — Telephone Encounter (Signed)
Left message for patient to call office.  

## 2020-03-25 ENCOUNTER — Inpatient Hospital Stay: Payer: Self-pay | Admitting: Cardiothoracic Surgery

## 2020-05-18 ENCOUNTER — Other Ambulatory Visit: Payer: Self-pay

## 2020-05-18 ENCOUNTER — Emergency Department
Admission: EM | Admit: 2020-05-18 | Discharge: 2020-05-18 | Disposition: A | Discharge status: Home / Self Care | Payer: Medicaid Other | Attending: Emergency Medicine | Admitting: Emergency Medicine

## 2020-05-18 DIAGNOSIS — K0889 Other specified disorders of teeth and supporting structures: Secondary | ICD-10-CM | POA: Insufficient documentation

## 2020-05-18 DIAGNOSIS — R519 Headache, unspecified: Secondary | ICD-10-CM | POA: Insufficient documentation

## 2020-05-18 DIAGNOSIS — Z87891 Personal history of nicotine dependence: Secondary | ICD-10-CM | POA: Insufficient documentation

## 2020-05-18 MED ORDER — KETOROLAC TROMETHAMINE 30 MG/ML IJ SOLN
30.0000 mg | Freq: Once | INTRAMUSCULAR | Status: DC
  Filled 2020-05-18: qty 1

## 2020-05-18 MED ORDER — AMOXICILLIN 400 MG/5ML PO SUSR: 500.0000 mg | Freq: Three times a day (TID) | ORAL | 0 refills | Status: AC

## 2020-05-18 MED ORDER — KETOROLAC TROMETHAMINE 30 MG/ML IJ SOLN
30.0000 mg | Freq: Once | INTRAMUSCULAR | Status: AC
  Administered 2020-05-18: 30 mg via INTRAMUSCULAR

## 2020-05-18 MED ORDER — KETOROLAC TROMETHAMINE 10 MG PO TABS: 10.0000 mg | ORAL_TABLET | Freq: Four times a day (QID) | ORAL | 0 refills | Status: DC | PRN

## 2020-05-18 NOTE — ED Triage Notes (Signed)
Right sided toothache with mild facial swelling to right side X 3 days. Reports right sided headache. Pt alert and oriented X4, cooperative, RR even and unlabored, color WNL. Pt in NAD.

## 2020-05-18 NOTE — ED Provider Notes (Signed)
[facility]-EMERGENCY DEPARTMENT  ____________________________________________  Time seen: Approximately 11:47 AM  I have reviewed the triage vital signs and the nursing notes.   HISTORY  Chief Complaint Dental Pain   Historian Patient     HPI Roberto Stanton is a 25 y.o. male presents to the emergency department with right lower jaw swelling for the past 3 days and pain.  Patient has several broken teeth along the right lower jaw.  He reports that he has not made an appointment with a local dentist as he does not currently have insurance.  He denies difficulty swallowing or pain underneath the tongue.  No fever or chills at home.  Patient states that he has experienced a headache.  He denies chest pain, chest tightness or shortness of breath.  No other alleviating measures have been attempted at home.   Past Medical History:  Diagnosis Date  . Kidney stones   . Pericarditis   . Rhabdomyolysis      Immunizations up to date:  Yes.     Past Medical History:  Diagnosis Date  . Kidney stones   . Pericarditis   . Rhabdomyolysis     Patient Active Problem List   Diagnosis Date Noted  . Acute respiratory failure (HCC) 01/08/2020    History reviewed. No pertinent surgical history.  Prior to Admission medications   Medication Sig Start Date End Date Taking? Authorizing Provider  amoxicillin (AMOXIL) 400 MG/5ML suspension Take 6.3 mLs (500 mg total) by mouth 3 (three) times daily for 10 days. 05/18/20 05/28/20 Yes Pia Mau M, PA-C  ketorolac (TORADOL) 10 MG tablet Take 1 tablet (10 mg total) by mouth every 6 (six) hours as needed. 05/18/20  Yes Pia Mau M, PA-C  busPIRone (BUSPAR) 10 MG tablet Take 1 tablet (10 mg total) by mouth 3 (three) times daily. 01/08/20   Rhetta Mura, MD  HYDROcodone-acetaminophen (NORCO/VICODIN) 5-325 MG tablet Take 1-2 tablets by mouth every 6 (six) hours as needed for moderate pain or severe pain. 02/19/20 02/18/21  Shaune Pollack,  MD  Multiple Vitamin (MULTIVITAMIN) tablet Take 1 tablet by mouth daily. Taking 1 tablet daily    [provider]  penicillin v potassium (VEETID) 250 MG tablet Take 1 tablet (250 mg total) by mouth 4 (four) times daily. 01/18/20   Jene Every, MD  traZODone (DESYREL) 50 MG tablet Take 1 tablet (50 mg total) by mouth at bedtime. 01/08/20   Rhetta Mura, MD    Allergies Patient has no known allergies.  No family history on file.  Social History Social History   Tobacco Use  . Smoking status: Former Games developer  . Smokeless tobacco: Never Used  Vaping Use  . Vaping Use: Every day  . Substances: Nicotine  Substance Use Topics  . Alcohol use: Not Currently    Comment: occasional  . Drug use: Yes    Types: Marijuana     Review of Systems  Constitutional: No fever/chills Eyes:  No discharge ENT: Patient has dental pain.  Respiratory: no cough. No SOB/ use of accessory muscles to breath Gastrointestinal:   No nausea, no vomiting.  No diarrhea.  No constipation. Musculoskeletal: Negative for musculoskeletal pain. Skin: Negative for rash, abrasions, lacerations, ecchymosis.   ____________________________________________   PHYSICAL EXAM:  VITAL SIGNS: ED Triage Vitals  Enc Vitals Group     BP 05/18/20 0948 (!) 147/100     Pulse Rate 05/18/20 0948 98     Resp 05/18/20 0948 18     Temp 05/18/20 0948  97.8 F (36.6 C)     Temp Source 05/18/20 0948 Oral     SpO2 05/18/20 0948 100 %     Weight 05/18/20 0954 133 lb (60.3 kg)     Height 05/18/20 0954 5\' 9"  (1.753 m)     Head Circumference --      Peak Flow --      Pain Score 05/18/20 0954 8     Pain Loc --      Pain Edu? --      Excl. in GC? --      Constitutional: Alert and oriented. Well appearing and in no acute distress. Eyes: Conjunctivae are normal. PERRL. EOMI. Head: Atraumatic. ENT:      Nose: No congestion/rhinnorhea.      Mouth/Throat: Mucous membranes are moist.  Patient has mild swelling  on the right lower jaw.  No pain to palpation underneath the tongue.  Patient has broken inferior 30 and 31. Neck: No stridor.  No cervical spine tenderness to palpation. Cardiovascular: Normal rate, regular rhythm. Normal S1 and S2.  Good peripheral circulation. Respiratory: Normal respiratory effort without tachypnea or retractions. Lungs CTAB. Good air entry to the bases with no decreased or absent breath sounds Gastrointestinal: Bowel sounds x 4 quadrants. Soft and nontender to palpation. No guarding or rigidity. No distention. Musculoskeletal: Full range of motion to all extremities. No obvious deformities noted Neurologic:  Normal for age. No gross focal neurologic deficits are appreciated.  Skin:  Skin is warm, dry and intact. No rash noted. Psychiatric: Mood and affect are normal for age. Speech and behavior are normal.   ____________________________________________   LABS (all labs ordered are listed, but only abnormal results are displayed)  Labs Reviewed - No data to display ____________________________________________  EKG   ____________________________________________  RADIOLOGY   No results found.  ____________________________________________    PROCEDURES  Procedure(s) performed:     Procedures     Medications  ketorolac (TORADOL) 30 MG/ML injection 30 mg (has no administration in time range)     ____________________________________________   INITIAL IMPRESSION / ASSESSMENT AND PLAN / ED COURSE  Pertinent labs & imaging results that were available during my care of the patient were reviewed by me and considered in my medical decision making (see chart for details).      Assessment and plan Dental pain 25 year old male presents to the emergency department with acute dental pain over the past 3 days.  Patient did have some mild swelling of the right lower jaw.  He was managing his own secretions and had no pain underneath the tongue.  He had no  neuro deficits on exam.  Will treat with IM Toradol in the emergency department for pain and oral Toradol at home.  Will treat with amoxicillin 3 times daily for the next 10 days.  Also provided patient with dental resources in his discharge paperwork.     ____________________________________________  FINAL CLINICAL IMPRESSION(S) / ED DIAGNOSES  Final diagnoses:  Pain, dental      NEW MEDICATIONS STARTED DURING THIS VISIT:  ED Discharge Orders         Ordered    ketorolac (TORADOL) 10 MG tablet  Every 6 hours PRN        05/18/20 1146    amoxicillin (AMOXIL) 400 MG/5ML suspension  3 times daily        05/18/20 1146              This chart was dictated using voice recognition software/Dragon.  Despite best efforts to proofread, errors can occur which can change the meaning. Any change was purely unintentional.     Orvil Feil, PA-C 05/18/20 1151    Minna Antis, MD 05/18/20 [phone]

## 2020-05-18 NOTE — Discharge Instructions (Signed)
OPTIONS FOR DENTAL FOLLOW UP CARE ° °[place] Department of Health and Human Services - Local Safety Net Dental Clinics °http://www.ncdhhs.gov/dph/oralhealth/services/safetynetclinics.htm °  °[facility] (336-562-3123) ° °[facility] [place] (919-933-9087) ° °[facility] (919-663-1744 ext [phone]) ° °[facility] ([phone]) ° °[facility] ([phone]) °This clinic caters to the indigent population and is on a lottery system. °Location: °[facility], [address], [place] °Clinic Hours: °Wednesdays from 6pm - 9pm, patients seen by a lottery system. °For dates, call or go to www.med.[facility].edu/[facility]/patients/Dental-[facility] °Services: °Cleanings, fillings and simple extractions. °Payment Options: °DENTAL WORK IS FREE OF CHARGE. Bring proof of income or support. °Best way to get seen: °Arrive at 5:15 pm - this is a lottery, NOT first come/first serve, so arriving earlier will not increase your chances of being seen. °  °  °[facility] °[phone] °Select option 1 for emergencies °  °Location: °[facility], [address], [place] °Clinic Hours: °No walk-ins accepted - call the day before to schedule an appointment. °Check in times are 9:30 am and 1:30 pm. °Services: °Simple extractions, temporary fillings, pulpectomy/pulp debridement, uncomplicated abscess drainage. °Payment Options: °PAYMENT IS DUE AT THE TIME OF SERVICE.  Fee is usually $100-200, additional surgical procedures (e.g. abscess drainage) may be extra. °Cash, checks, Visa/MasterCard accepted.  Can file Medicaid if patient is covered for dental - patient should call case worker to check. °No discount for [facility] patients. °Best way to get seen: °MUST call the day before and get onto the schedule. Can usually be seen the next 1-2 days. No walk-ins accepted. °  °  °[facility] °[phone] °   °Location: °[facility], [address], [place] °Clinic Hours: °M, W, Th, F 8am or 1:30pm, Tues 9a or 1:30 - first come/first served. °Services: °Simple extractions, temporary fillings, uncomplicated abscess drainage.  You do not need to be an [place] resident. °Payment Options: °PAYMENT IS DUE AT THE TIME OF SERVICE. °Dental insurance, otherwise sliding scale - bring proof of income or support. °Depending on income and treatment needed, cost is usually $50-200. °Best way to get seen: °Arrive early as it is first come/first served. °  °  °[facility] °[phone] °  °Location: °[address] °Clinic Hours: °Mon-Thu 8a-5p °Services: °Most basic dental services including extractions and fillings. °Payment Options: °PAYMENT IS DUE AT THE TIME OF SERVICE. °Sliding scale, up to 50% off - bring proof if income or support. °Medicaid with dental option accepted. °Best way to get seen: °Call to schedule an appointment, can usually be seen within 2 weeks OR they will try to see walk-ins - show up at 8a or 2p (you may have to wait). °  °  °[facility] °[phone] °[place] RESIDENTS ONLY °  °Location: °[facility], [address], [place],  [address] °Clinic Hours: By appointment only. °Monday - Thursday 8am-5pm, Friday 8am-12pm °Services: Cleanings, fillings, extractions. °Payment Options: °PAYMENT IS DUE AT THE TIME OF SERVICE. °Cash, Visa or MasterCard. Sliding scale - $30 minimum per service. °Best way to get seen: °Come in to office, complete packet and make an appointment - need proof of income °or support monies for each household member and proof of [place] residence. °Usually takes about a month to get in. °  °  °[facility] °[phone] °  °Location: °[address]   [place] °Clinic Hours: Walk-in Urgent Care Dental Services are offered Monday-Friday  mornings only. °The numbers of emergencies accepted daily is limited to the number of °providers available. °Maximum 15 - Mondays, Wednesdays & Thursdays °Maximum 10 - Tuesdays & Fridays °Services: °You do not need to be a [place] resident to be seen for a dental emergency. °Emergencies are defined as pain, swelling, abnormal bleeding, or dental trauma. Walkins will receive x-rays if needed. °NOTE: Dental cleaning is not an emergency. °Payment Options: °PAYMENT IS DUE AT THE TIME OF SERVICE. °Minimum co-pay is $40.00 for uninsured patients. °Minimum co-pay is $3.00 for Medicaid with dental coverage. °Dental Insurance is accepted and must be presented at time of visit. °Medicare does not cover dental. °Forms of payment: Cash, credit card, checks. °Best way to get seen: °If not previously registered with the clinic, walk-in dental registration begins at 7:15 am and is on a first come/first serve basis. °If previously registered with the clinic, call to make an appointment. °  °  °[facility] °[phone] °[place] RESIDENTS ONLY °  °Location: °[address], [place], [place] °Clinic Hours: °Mon-Thu 10a-2p °Services: Extractions only! °Payment Options: °FREE (donations accepted) - bring proof of income or support °Best way to get seen: °Call and schedule an appointment OR come at 8am on the 1st Monday of every month (except for holidays) when it is first come/first served. °  °  °[facility] °[phone] °  °Location: °[address], [place] °Clinic Hours: °Friday mornings °Services, Payment Options, Best way to get seen: °Call for info °

## 2020-05-18 NOTE — ED Notes (Signed)
Pt alert and oriented X 4, stable for discharge. RR even and unlabored, color WNL. Discussed discharge instructions and follow-up as directed. Discharge medications discussed if prescribed. Pt had opportunity to ask questions, and RN to provide patient/family eduction.  

## 2020-05-23 ENCOUNTER — Emergency Department
Admission: EM | Admit: 2020-05-23 | Discharge: 2020-05-24 | Disposition: A | Discharge status: Home / Self Care | Payer: Medicaid Other | Attending: Emergency Medicine | Admitting: Emergency Medicine

## 2020-05-23 ENCOUNTER — Emergency Department: Payer: Medicaid Other

## 2020-05-23 ENCOUNTER — Other Ambulatory Visit: Payer: Self-pay

## 2020-05-23 DIAGNOSIS — F419 Anxiety disorder, unspecified: Secondary | ICD-10-CM | POA: Insufficient documentation

## 2020-05-23 DIAGNOSIS — R0789 Other chest pain: Secondary | ICD-10-CM

## 2020-05-23 DIAGNOSIS — Z87891 Personal history of nicotine dependence: Secondary | ICD-10-CM | POA: Insufficient documentation

## 2020-05-23 DIAGNOSIS — R748 Abnormal levels of other serum enzymes: Secondary | ICD-10-CM

## 2020-05-23 LAB — BASIC METABOLIC PANEL
Anion gap: 9 (ref 5–15)
BUN: 11 mg/dL (ref 6–20)
CO2: 28 mmol/L (ref 22–32)
Calcium: 9.4 mg/dL (ref 8.9–10.3)
Chloride: 100 mmol/L (ref 98–111)
Creatinine, Ser: 0.8 mg/dL (ref 0.61–1.24)
GFR, Estimated: >60 mL/min (ref >60)
Glucose, Bld: 89 mg/dL (ref 70–99)
Potassium: 3.6 mmol/L (ref 3.5–5.1)
Sodium: 137 mmol/L (ref 135–145)

## 2020-05-23 LAB — CBC
HCT: 43.9 % (ref 39.0–52.0)
Hemoglobin: 15.6 g/dL (ref 13.0–17.0)
MCH: 32.6 pg (ref 26.0–34.0)
MCHC: 35.5 g/dL (ref 30.0–36.0)
MCV: 91.6 fL (ref 80.0–100.0)
Platelets: 209 10*3/uL (ref 150–400)
RBC: 4.79 MIL/uL (ref 4.22–5.81)
RDW: 12.8 % (ref 11.5–15.5)
WBC: 7.8 10*3/uL (ref 4.0–10.5)
nRBC: 0 % (ref 0.0–0.2)

## 2020-05-23 LAB — TROPONIN I (HIGH SENSITIVITY)
Troponin I (High Sensitivity): 5 ng/L (ref <18)
Troponin I (High Sensitivity): 6 ng/L (ref <18)

## 2020-05-23 LAB — TSH: TSH: 0.898 u[IU]/mL (ref 0.350–4.500)

## 2020-05-23 LAB — CK: Total CK: 6653 U/L — ABNORMAL HIGH (ref 49–397)

## 2020-05-23 MED ORDER — LORAZEPAM 2 MG/ML IJ SOLN
1.0000 mg | Freq: Once | INTRAMUSCULAR | Status: DC
  Filled 2020-05-23: qty 1

## 2020-05-23 MED ORDER — SODIUM CHLORIDE 0.9 % IV BOLUS
1000.0000 mL | Freq: Once | INTRAVENOUS | Status: AC
  Administered 2020-05-23: 1000 mL via INTRAVENOUS

## 2020-05-23 NOTE — ED Provider Notes (Signed)
  Physical Exam  BP (!) 132/94   Pulse 96   Temp 97.8 F (36.6 C) (Oral)   Resp 18   Ht 5\' 9"  (1.753 m)   Wt 61.2 kg   SpO2 100%   BMI 19.94 kg/m   Physical Exam  ED Course/Procedures     Procedures  MDM  11:50 PM  Assumed care.  Patient here with atypical chest pain, anxiety.  History of elevated CK levels in the past.  Otherwise work-up unrevealing.  Normal TSH, creatinine.  Urinalysis, urine drug screen pending.  Patient getting 2 L of IV fluids and and CK will be rechecked.  If improving, anticipate discharge home with outpatient follow-up.  2:35 AM  Pt's CK level has improved.  His urine is clear without myoglobinuria and his renal function is normal.  Have encouraged him to increase his water intake, avoid caffeine, stimulants, alcohol.  He works as a and states symptoms seem to worsen after exertion.  No other increased physical exertion, exercise.  Have advised him to take the next week off of work and follow-up closely with his primary care provider as well as his neurologist at [facility].  As for his chest pain, symptoms seem very atypical.  He has had 2 flat troponins and a clear chest x-ray.  EKG is nonischemic.  His vital signs currently are normal.  I feel he is safe to be discharged.   At this time, I do not feel there is any life-threatening condition present. I have reviewed, interpreted and discussed all results (EKG, imaging, lab, urine as appropriate) and exam findings with patient/family. I have reviewed nursing notes and appropriate previous records.  I feel the patient is safe to be discharged home without further emergent workup and can continue workup as an outpatient as needed. Discussed usual and customary return precautions. Patient/family verbalize understanding and are comfortable with this plan.  Outpatient follow-up has been provided as needed. All questions have been answered.      Advait Buice, [facility], DO 05/24/20 0236

## 2020-05-23 NOTE — ED Provider Notes (Signed)
[facility] Emergency Department Provider Note  Time seen: 11:02 PM  I have reviewed the triage vital signs and the nursing notes.   HISTORY  Chief Complaint Chest Pain   HPI Roberto Stanton is a 25 y.o. male with a past medical history of rhabdomyolysis presents to the emergency department for chest pain.  Cording to the patient over the past several days he has been experiencing chest pain which he describes as moderate tightness.  Patient states severe anxiety and states he is always worried about his chest.  Denies any trauma.  Denies any fever cough or shortness of breath.  No leg pain or swelling.  Past Medical History:  Diagnosis Date  . Kidney stones   . Pericarditis   . Rhabdomyolysis     Patient Active Problem List   Diagnosis Date Noted  . Acute respiratory failure (HCC) 01/08/2020    History reviewed. No pertinent surgical history.  Prior to Admission medications   Medication Sig Start Date End Date Taking? Authorizing Provider  amoxicillin (AMOXIL) 400 MG/5ML suspension Take 6.3 mLs (500 mg total) by mouth 3 (three) times daily for 10 days. 05/18/20 05/28/20  Orvil Feil, PA-C  busPIRone (BUSPAR) 10 MG tablet Take 1 tablet (10 mg total) by mouth 3 (three) times daily. 01/08/20   Rhetta Mura, MD  HYDROcodone-acetaminophen (NORCO/VICODIN) 5-325 MG tablet Take 1-2 tablets by mouth every 6 (six) hours as needed for moderate pain or severe pain. 02/19/20 02/18/21  Shaune Pollack, MD  ketorolac (TORADOL) 10 MG tablet Take 1 tablet (10 mg total) by mouth every 6 (six) hours as needed. 05/18/20   Orvil Feil, PA-C  Multiple Vitamin (MULTIVITAMIN) tablet Take 1 tablet by mouth daily. Taking 1 tablet daily    [provider]  penicillin v potassium (VEETID) 250 MG tablet Take 1 tablet (250 mg total) by mouth 4 (four) times daily. 01/18/20   Jene Every, MD  traZODone (DESYREL) 50 MG tablet Take 1 tablet (50 mg total) by mouth  at bedtime. 01/08/20   Rhetta Mura, MD    No Known Allergies  No family history on file.  Social History Social History   Tobacco Use  . Smoking status: Former Games developer  . Smokeless tobacco: Never Used  Vaping Use  . Vaping Use: Every day  . Substances: Nicotine  Substance Use Topics  . Alcohol use: Yes    Comment: occasional  . Drug use: Not Currently    Types: Marijuana    Review of Systems Constitutional: Negative for fever. Cardiovascular: Intermittent mild central chest pain/tightness.  Patient states this is somewhat ongoing/chronic. Respiratory: Negative for shortness of breath. Gastrointestinal: Negative for abdominal pain, vomiting  Musculoskeletal: Negative for musculoskeletal complaints Neurological: Negative for headache All other ROS negative  ____________________________________________   PHYSICAL EXAM:  VITAL SIGNS: ED Triage Vitals  Enc Vitals Group     BP 05/23/20 2045 (!) 155/109     Pulse Rate 05/23/20 2043 (!) 113     Resp 05/23/20 2043 16     Temp 05/23/20 2043 97.8 F (36.6 C)     Temp Source 05/23/20 2043 Oral     SpO2 05/23/20 2043 100 %     Weight 05/23/20 2041 135 lb (61.2 kg)     Height 05/23/20 2041 5\' 9"  (1.753 m)     Head Circumference --      Peak Flow --      Pain Score 05/23/20 2041 3     Pain  Loc --      Pain Edu? --      Excl. in GC? --    Constitutional: Alert and oriented.  Eyes: Normal exam ENT      Head: Normocephalic and atraumatic.      Mouth/Throat: Mucous membranes are moist. Cardiovascular: Normal rate, regular rhythm. No murmur Respiratory: Normal respiratory effort without tachypnea nor retractions. Breath sounds are clear  Gastrointestinal: Soft and nontender. No distention.   Musculoskeletal: Nontender with normal range of motion in all extremities.  No lower extremity edema or tenderness. Neurologic:  Normal speech and language. No gross focal neurologic deficits are appreciated. Skin:  Skin is  warm, dry and intact.  Psychiatric: Anxious appearing.  ____________________________________________    EKG  EKG viewed and interpreted by myself shows sinus tachycardia 118 bpm with a narrow QRS, normal axis, normal intervals, nonspecific ST changes.  Signs of LVH.  ____________________________________________    RADIOLOGY  Chest x-ray is negative  ____________________________________________   INITIAL IMPRESSION / ASSESSMENT AND PLAN / ED COURSE  Pertinent labs & imaging results that were available during my care of the patient were reviewed by me and considered in my medical decision making (see chart for details).   Patient presents emergency department for evaluation of chest pain.  Patient states a history of significant anxiety and states he is often worried about his chest.  Overall patient appears well but moderately anxious.  Denies any nausea diaphoresis.  Patient has a past history of rhabdomyolysis and his CK was added onto the patient's lab work.  Patient's lab work overall reassuring besides a CK elevation of 6000.  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  States he has not been seen for this as an outpatient but was admitted for rhabdomyolysis several months ago.  Patient denies any new medications.  Denies any significant physical activity.  Patient denies any seizure activity or substance use.  Given the patient's asymptomatic CK elevation we will IV hydrate with 2 L of fluid I have added on a TSH.  If the patient's CK is not increasing and is downtrending I believe the patient could be discharged home with neurology follow-up for further evaluation for his asymptomatic CK elevation.  Repeat troponin is also pending.  Roberto Stanton was evaluated in Emergency Department on 05/23/2020 for the symptoms described in the history of present illness. He was evaluated in the context of the global COVID-19 pandemic, which  necessitated consideration that the patient might be at risk for infection with the SARS-CoV-2 virus that causes COVID-19. Institutional protocols and algorithms that pertain to the evaluation of patients at risk for COVID-19 are in a state of rapid change based on information released by regulatory bodies including the CDC and federal and state organizations. These policies and algorithms were followed during the patient's care in the ED.  ____________________________________________   FINAL CLINICAL IMPRESSION(S) / ED DIAGNOSES  Elevated CK Chest pain Anxiety   Minna Antis, MD 05/23/20 2308

## 2020-05-23 NOTE — ED Triage Notes (Addendum)
Pt c/o left sided chest tightness that started aprox 4hours ago., Pt states he has a knot on the left side of his chest that has been swelling, when pts lifts his shirt pt pointing to rib. Pt denies sob, dizziness, light headed.  Pt has hx of pericarditis and rhabdo

## 2020-05-24 LAB — URINALYSIS, COMPLETE (UACMP) WITH MICROSCOPIC
Bacteria, UA: NONE SEEN
Bilirubin Urine: NEGATIVE
Glucose, UA: NEGATIVE mg/dL
Hgb urine dipstick: NEGATIVE
Ketones, ur: NEGATIVE mg/dL
Leukocytes,Ua: NEGATIVE
Nitrite: NEGATIVE
Protein, ur: NEGATIVE mg/dL
Specific Gravity, Urine: 1.005 (ref 1.005–1.030)
Squamous Epithelial / HPF: NONE SEEN (ref 0–5)
pH: 7 (ref 5.0–8.0)

## 2020-05-24 LAB — CK: Total CK: 5241 U/L — ABNORMAL HIGH (ref 49–397)

## 2020-05-24 LAB — URINE DRUG SCREEN, QUALITATIVE (ARMC ONLY)
Amphetamines, Ur Screen: NOT DETECTED
Barbiturates, Ur Screen: NOT DETECTED
Benzodiazepine, Ur Scrn: NOT DETECTED
Cannabinoid 50 Ng, Ur ~~LOC~~: NOT DETECTED
Cocaine Metabolite,Ur ~~LOC~~: NOT DETECTED
MDMA (Ecstasy)Ur Screen: NOT DETECTED
Methadone Scn, Ur: NOT DETECTED
Opiate, Ur Screen: NOT DETECTED
Phencyclidine (PCP) Ur S: NOT DETECTED
Tricyclic, Ur Screen: NOT DETECTED

## 2020-05-24 NOTE — Discharge Instructions (Addendum)
Your cardiac work-up today was reassuring.  Your CK level was elevated but did improve with IV fluids.  Given you are urinating normally and your renal function is normal, we feel you are safe to be discharged home with close outpatient follow-up.  I recommend close outpatient with your primary care provider as well as neurology.  I recommend increase water intake, avoiding alcohol and caffeine.  Recommend no strenuous activities for the next week.  You may alternate Tylenol 1000 mg every 6 hours as needed for pain, fever and Ibuprofen 800 mg every 8 hours as needed for pain, fever.  Please take Ibuprofen with food.  Do not take more than 4000 mg of Tylenol (acetaminophen) in a 24 hour period.  Steps to find a Primary Care Provider (PCP):  Call [phone] or [phone] to access "[place] Find a Doctor Service."  2.  You may also go on the [facility] website at [email or web address]

## 2020-11-23 ENCOUNTER — Emergency Department: Payer: Medicaid Other

## 2020-11-23 ENCOUNTER — Other Ambulatory Visit: Payer: Self-pay

## 2020-11-23 ENCOUNTER — Emergency Department
Admission: EM | Admit: 2020-11-23 | Discharge: 2020-11-23 | Disposition: A | Discharge status: Home / Self Care | Payer: Medicaid Other | Attending: Emergency Medicine | Admitting: Emergency Medicine

## 2020-11-23 DIAGNOSIS — Z87891 Personal history of nicotine dependence: Secondary | ICD-10-CM | POA: Insufficient documentation

## 2020-11-23 DIAGNOSIS — G8929 Other chronic pain: Secondary | ICD-10-CM | POA: Insufficient documentation

## 2020-11-23 DIAGNOSIS — R0789 Other chest pain: Secondary | ICD-10-CM | POA: Insufficient documentation

## 2020-11-23 LAB — BASIC METABOLIC PANEL
Anion gap: 9 (ref 5–15)
BUN: 11 mg/dL (ref 6–20)
CO2: 30 mmol/L (ref 22–32)
Calcium: 9 mg/dL (ref 8.9–10.3)
Chloride: 96 mmol/L — ABNORMAL LOW (ref 98–111)
Creatinine, Ser: 0.83 mg/dL (ref 0.61–1.24)
GFR, Estimated: >60 mL/min (ref >60)
Glucose, Bld: 112 mg/dL — ABNORMAL HIGH (ref 70–99)
Potassium: 3.7 mmol/L (ref 3.5–5.1)
Sodium: 135 mmol/L (ref 135–145)

## 2020-11-23 LAB — TROPONIN I (HIGH SENSITIVITY): Troponin I (High Sensitivity): 3 ng/L (ref <18)

## 2020-11-23 LAB — CBC
HCT: 42.1 % (ref 39.0–52.0)
Hemoglobin: 15.4 g/dL (ref 13.0–17.0)
MCH: 33.8 pg (ref 26.0–34.0)
MCHC: 36.6 g/dL — ABNORMAL HIGH (ref 30.0–36.0)
MCV: 92.3 fL (ref 80.0–100.0)
Platelets: 270 10*3/uL (ref 150–400)
RBC: 4.56 MIL/uL (ref 4.22–5.81)
RDW: 12.8 % (ref 11.5–15.5)
WBC: 6.3 10*3/uL (ref 4.0–10.5)
nRBC: 0 % (ref 0.0–0.2)

## 2020-11-23 MED ORDER — KETOROLAC TROMETHAMINE 60 MG/2ML IM SOLN
15.0000 mg | Freq: Once | INTRAMUSCULAR | Status: AC
  Administered 2020-11-23: 15 mg via INTRAMUSCULAR
  Filled 2020-11-23: qty 2

## 2020-11-23 MED ORDER — OXYCODONE-ACETAMINOPHEN 5-325 MG PO TABS
1.0000 | ORAL_TABLET | Freq: Once | ORAL | Status: AC
  Administered 2020-11-23: 1 via ORAL
  Filled 2020-11-23: qty 1

## 2020-11-23 MED ORDER — LIDOCAINE 5 % EX PTCH: 1.0000 | MEDICATED_PATCH | Freq: Two times a day (BID) | CUTANEOUS | 0 refills | Status: DC

## 2020-11-23 MED ORDER — LIDOCAINE 5 % EX PTCH
1.0000 | MEDICATED_PATCH | CUTANEOUS | Status: DC
  Administered 2020-11-23: 1 via TRANSDERMAL
  Filled 2020-11-23: qty 1

## 2020-11-23 MED ORDER — KETOROLAC TROMETHAMINE 10 MG PO TABS: 10.0000 mg | ORAL_TABLET | Freq: Four times a day (QID) | ORAL | 0 refills | Status: DC | PRN

## 2020-11-23 MED ORDER — OXYCODONE-ACETAMINOPHEN 5-325 MG PO TABS: 1.0000 | ORAL_TABLET | Freq: Three times a day (TID) | ORAL | 0 refills | Status: AC | PRN

## 2020-11-23 NOTE — ED Provider Notes (Signed)
[facility] Emergency Department Provider Note  ____________________________________________  Time seen: Approximately 7:05 PM  I have reviewed the triage vital signs and the nursing notes.   HISTORY  Chief Complaint Chest Pain    HPI LUCA DYAR is a 25 y.o. male with a history of pericarditis, kidney stones, rhabdomyolysis who comes ED complaining of left chest wall pain and fast heartbeat for the last few days.  This is similar to chronic pain that he has had, which his doctors referred him to pain management for, but worse today.  He is tried anti-inflammatory medicine and feels like this is not adequate pain relief.  No new injuries.  No exertional symptoms.  EMR reviewed, showing 2 CT angiograms obtained about a year ago which were negative for PE or any other acute issues.  Normal bones on those scans.  Recently had an stress test which did not produce any anginal symptoms.  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  Denies any dizziness or syncope.  No shortness of breath.  No pleuritic pain.  No exertional symptoms.  Pain is moderate intensity, no aggravating factors, nonradiating.    Past Medical History:  Diagnosis Date   Kidney stones    Pericarditis    Rhabdomyolysis      Patient Active Problem List   Diagnosis Date Noted   Acute respiratory failure (HCC) 01/08/2020     History reviewed. No pertinent surgical history.   Prior to Admission medications   Medication Sig Start Date End Date Taking? Authorizing Provider  ketorolac (TORADOL) 10 MG tablet Take 1 tablet (10 mg total) by mouth every 6 (six) hours as needed for moderate pain. 11/23/20  Yes Sharman Cheek, MD  lidocaine (LIDODERM) 5 % Place 1 patch onto the skin every 12 (twelve) hours. Remove & Discard patch within 12 hours or as  directed by MD 11/23/20  Yes Sharman Cheek, MD  oxyCODONE-acetaminophen (PERCOCET) 5-325 MG tablet Take 1 tablet by mouth every 8 (eight) hours as needed for up to 1 day for severe pain. 11/23/20 11/24/20 Yes Sharman Cheek, MD  busPIRone (BUSPAR) 10 MG tablet Take 1 tablet (10 mg total) by mouth 3 (three) times daily. 01/08/20   Rhetta Mura, MD  HYDROcodone-acetaminophen (NORCO/VICODIN) 5-325 MG tablet Take 1-2 tablets by mouth every 6 (six) hours as needed for moderate pain or severe pain. 02/19/20 02/18/21  Shaune Pollack, MD  Multiple Vitamin (MULTIVITAMIN) tablet Take 1 tablet by mouth daily. Taking 1 tablet daily    [provider]  penicillin v potassium (VEETID) 250 MG tablet Take 1 tablet (250 mg total) by mouth 4 (four) times daily. 01/18/20   Jene Every, MD  traZODone (DESYREL) 50 MG tablet Take 1 tablet (50 mg total) by mouth at bedtime. 01/08/20   Rhetta Mura, MD     Allergies Patient has no known allergies.   History reviewed. No pertinent family history.  Social History Social History   Tobacco Use   Smoking status: Former   Smokeless tobacco: Never  Building services engineer Use: Every day   Substances: Nicotine  Substance Use Topics   Alcohol use: Yes    Comment: occasional   Drug use: Not Currently    Types: Marijuana    Review of Systems  Constitutional:   No fever or chills.  ENT:  No sore throat. No rhinorrhea. Cardiovascular:   No chest pain or syncope. Respiratory:   No dyspnea or cough. Gastrointestinal:   Negative for abdominal pain, vomiting and diarrhea.  Musculoskeletal:   Positive chest wall pain as above All other systems reviewed and are negative except as documented above in ROS and HPI.  ____________________________________________   PHYSICAL EXAM:  VITAL SIGNS: ED Triage Vitals  Enc Vitals Group     BP 11/23/20 1552 (!) 143/96     Pulse Rate 11/23/20 1552 98     Resp 11/23/20 1552 18     Temp  11/23/20 1552 98.2 F (36.8 C)     Temp Source 11/23/20 1552 Oral     SpO2 11/23/20 1552 97 %     Weight 11/23/20 1549 135 lb (61.2 kg)     Height 11/23/20 1549 5\' 9"  (1.753 m)     Head Circumference --      Peak Flow --      Pain Score 11/23/20 1549 4     Pain Loc --      Pain Edu? --      Excl. in GC? --     Vital signs reviewed, nursing assessments reviewed.   Constitutional:   Alert and oriented. Non-toxic appearance. Eyes:   Conjunctivae are normal. EOMI. PERRL. ENT      Head:   Normocephalic and atraumatic.      Nose:   Wearing a mask.      Mouth/Throat:   Wearing a mask.      Neck:   No meningismus. Full ROM. Hematological/Lymphatic/Immunilogical:   No cervical lymphadenopathy. Cardiovascular:   RRR, heart rate 90. Symmetric bilateral radial and DP pulses.  No murmurs. Cap refill less than 2 seconds. Respiratory:   Normal respiratory effort without tachypnea/retractions. Breath sounds are clear and equal bilaterally. No wheezes/rales/rhonchi. Gastrointestinal:   Soft and nontender. Non distended. There is no CVA tenderness.  No rebound, rigidity, or guarding. Genitourinary:   deferred Musculoskeletal:   Normal range of motion in all extremities. No joint effusions.  No lower extremity tenderness.  No edema.  Left lower chest wall anteriorly mildly tender to the touch reproducing his pain.  Fifth rib is somewhat prominent and he identifies this as focus of his symptoms. Neurologic:   Normal speech and language.  Motor grossly intact. No acute focal neurologic deficits are appreciated.  Skin:    Skin is warm, dry and intact. No rash noted.  No petechiae, purpura, or bullae.  ____________________________________________    LABS (pertinent positives/negatives) (all labs ordered are listed, but only abnormal results are displayed) Labs Reviewed  BASIC METABOLIC PANEL - Abnormal; Notable for the following components:      Result Value   Chloride 96 (*)    Glucose, Bld  112 (*)    All other components within normal limits  CBC - Abnormal; Notable for the following components:   MCHC 36.6 (*)    All other components within normal limits  TROPONIN I (HIGH SENSITIVITY)  TROPONIN I (HIGH SENSITIVITY)   ____________________________________________   EKG  Interpreted by me Sinus tachycardia rate 108.  Normal axis and intervals.  Normal QRS ST segments and T waves.  No ischemic changes.  No evidence of underlying dysrhythmia.  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 Muscle biopsy 2 weeks ago. Tenderness with palpation. History of pericarditis and  rhabdomyolysis, former smoker EXAM: CHEST - 2 VIEW COMPARISON:  05/23/2020 FINDINGS: Normal heart size, mediastinal contours, and pulmonary vascularity. Lungs hyperinflated but clear. No pulmonary infiltrate, pleural effusion, or pneumothorax. Osseous structures unremarkable. IMPRESSION: Hyperinflated lungs without infiltrate. Electronically Signed   By: Ulyses Southward M.D.   On: 11/23/2020 16:40    ____________________________________________   PROCEDURES Procedures  ____________________________________________    CLINICAL IMPRESSION / ASSESSMENT AND PLAN / ED COURSE  Medications ordered in the ED: Medications  lidocaine (LIDODERM) 5 % 1 patch (1 patch Transdermal Patch Applied 11/23/20 1900)  ketorolac (TORADOL) injection 15 mg (15 mg Intramuscular Given 11/23/20 1900)  oxyCODONE-acetaminophen (PERCOCET/ROXICET) 5-325 MG per tablet 1 tablet (1 tablet Oral Given 11/23/20 1900)    Pertinent labs & imaging results that were available during my care of the patient were reviewed by me and considered in my medical decision making (see chart for details).  Roberto Stanton was evaluated in Emergency Department on 11/23/2020 for the symptoms described in  the history of present illness. He was evaluated in the context of the global COVID-19 pandemic, which necessitated consideration that the patient might be at risk for infection with the SARS-CoV-2 virus that causes COVID-19. Institutional protocols and algorithms that pertain to the evaluation of patients at risk for COVID-19 are in a state of rapid change based on information released by regulatory bodies including the CDC and federal and state organizations. These policies and algorithms were followed during the patient's care in the ED.   Patient presents with left chest wall pain which appears to be acute on chronic.  It is reproducible on exam.  Labs EKG and chest x-ray do not show any acute issues.  Vital signs are normal.  He has been referred to pain management for this and is currently undergoing continued outpatient work-up and is stable to return back to his outpatient doctor.   Considering the patient's symptoms, medical history, and physical examination today, I have low suspicion for ACS, PE, TAD, pneumothorax, carditis, mediastinitis, pneumonia, CHF, or sepsis.        ____________________________________________   FINAL CLINICAL IMPRESSION(S) / ED DIAGNOSES    Final diagnoses:  Chest wall pain     ED Discharge Orders          Ordered    oxyCODONE-acetaminophen (PERCOCET) 5-325 MG tablet  Every 8 hours PRN        11/23/20 1904    ketorolac (TORADOL) 10 MG tablet  Every 6 hours PRN        11/23/20 1904    lidocaine (LIDODERM) 5 %  Every 12 hours        11/23/20 1904            Portions of this note were generated with dragon dictation software. Dictation errors may occur despite best attempts at proofreading.    Sharman Cheek, MD 11/23/20 1910

## 2020-11-23 NOTE — ED Triage Notes (Addendum)
Pt comes pov with heart racing, chest wall pain. 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 States tenderness with palpation. Had muscle biopsy two weeks ago. Hx of pericarditis and rhabdo.

## 2021-06-07 ENCOUNTER — Other Ambulatory Visit: Payer: Self-pay

## 2021-06-07 DIAGNOSIS — M6282 Rhabdomyolysis: Secondary | ICD-10-CM | POA: Diagnosis present

## 2021-06-07 DIAGNOSIS — E7404 McArdle disease: Principal | ICD-10-CM | POA: Diagnosis present

## 2021-06-07 DIAGNOSIS — Z87891 Personal history of nicotine dependence: Secondary | ICD-10-CM

## 2021-06-07 DIAGNOSIS — Z87442 Personal history of urinary calculi: Secondary | ICD-10-CM

## 2021-06-07 DIAGNOSIS — Z79899 Other long term (current) drug therapy: Secondary | ICD-10-CM

## 2021-06-07 DIAGNOSIS — Z888 Allergy status to other drugs, medicaments and biological substances status: Secondary | ICD-10-CM

## 2021-06-07 LAB — CBC WITH DIFFERENTIAL/PLATELET
Abs Immature Granulocytes: 0.03 10*3/uL (ref 0.00–0.07)
Basophils Absolute: 0.1 10*3/uL (ref 0.0–0.1)
Basophils Relative: 1 %
Eosinophils Absolute: 0.2 10*3/uL (ref 0.0–0.5)
Eosinophils Relative: 1 %
HCT: 44.1 % (ref 39.0–52.0)
Hemoglobin: 15.1 g/dL (ref 13.0–17.0)
Immature Granulocytes: 0 %
Lymphocytes Relative: 17 %
Lymphs Abs: 2.1 10*3/uL (ref 0.7–4.0)
MCH: 31.3 pg (ref 26.0–34.0)
MCHC: 34.2 g/dL (ref 30.0–36.0)
MCV: 91.3 fL (ref 80.0–100.0)
Monocytes Absolute: 1 10*3/uL (ref 0.1–1.0)
Monocytes Relative: 8 %
Neutro Abs: 9.2 10*3/uL — ABNORMAL HIGH (ref 1.7–7.7)
Neutrophils Relative %: 73 %
Platelets: 229 10*3/uL (ref 150–400)
RBC: 4.83 MIL/uL (ref 4.22–5.81)
RDW: 12.7 % (ref 11.5–15.5)
WBC: 12.6 10*3/uL — ABNORMAL HIGH (ref 4.0–10.5)
nRBC: 0 % (ref 0.0–0.2)

## 2021-06-07 NOTE — ED Triage Notes (Signed)
Pt states that he was recently dx with a muscle disorder and frequently gets rhabdo. Pt states muscle soreness all over and weakness.  ?

## 2021-06-08 ENCOUNTER — Inpatient Hospital Stay
Admission: EM | Admit: 2021-06-08 | Discharge: 2021-06-10 | DRG: 642 | Disposition: A | Discharge status: Home / Self Care | Payer: Medicaid Other | Attending: Internal Medicine | Admitting: Internal Medicine

## 2021-06-08 ENCOUNTER — Encounter: Payer: Self-pay | Admitting: Family Medicine

## 2021-06-08 DIAGNOSIS — M6282 Rhabdomyolysis: Secondary | ICD-10-CM | POA: Diagnosis present

## 2021-06-08 LAB — URINALYSIS, ROUTINE W REFLEX MICROSCOPIC
Bilirubin Urine: NEGATIVE
Glucose, UA: NEGATIVE mg/dL
Hgb urine dipstick: NEGATIVE
Ketones, ur: NEGATIVE mg/dL
Leukocytes,Ua: NEGATIVE
Nitrite: NEGATIVE
Protein, ur: NEGATIVE mg/dL
Specific Gravity, Urine: 1.003 — ABNORMAL LOW (ref 1.005–1.030)
pH: 7 (ref 5.0–8.0)

## 2021-06-08 LAB — BASIC METABOLIC PANEL
Anion gap: 8 (ref 5–15)
Anion gap: 4 — ABNORMAL LOW (ref 5–15)
BUN: 16 mg/dL (ref 6–20)
BUN: 11 mg/dL (ref 6–20)
CO2: 26 mmol/L (ref 22–32)
CO2: 28 mmol/L (ref 22–32)
Calcium: 8.8 mg/dL — ABNORMAL LOW (ref 8.9–10.3)
Calcium: 8.6 mg/dL — ABNORMAL LOW (ref 8.9–10.3)
Chloride: 101 mmol/L (ref 98–111)
Chloride: 105 mmol/L (ref 98–111)
Creatinine, Ser: 0.79 mg/dL (ref 0.61–1.24)
Creatinine, Ser: 0.59 mg/dL — ABNORMAL LOW (ref 0.61–1.24)
GFR, Estimated: >60 mL/min (ref >60)
GFR, Estimated: >60 mL/min (ref >60)
Glucose, Bld: 94 mg/dL (ref 70–99)
Glucose, Bld: 96 mg/dL (ref 70–99)
Potassium: 3.7 mmol/L (ref 3.5–5.1)
Potassium: 3.7 mmol/L (ref 3.5–5.1)
Sodium: 135 mmol/L (ref 135–145)
Sodium: 137 mmol/L (ref 135–145)

## 2021-06-08 LAB — URINE DRUG SCREEN, QUALITATIVE (ARMC ONLY)
Amphetamines, Ur Screen: NOT DETECTED
Barbiturates, Ur Screen: NOT DETECTED
Benzodiazepine, Ur Scrn: NOT DETECTED
Cannabinoid 50 Ng, Ur ~~LOC~~: NOT DETECTED
Cocaine Metabolite,Ur ~~LOC~~: NOT DETECTED
MDMA (Ecstasy)Ur Screen: NOT DETECTED
Methadone Scn, Ur: NOT DETECTED
Opiate, Ur Screen: NOT DETECTED
Phencyclidine (PCP) Ur S: NOT DETECTED
Tricyclic, Ur Screen: NOT DETECTED

## 2021-06-08 LAB — CK
Total CK: 28047 U/L — ABNORMAL HIGH (ref 49–397)
Total CK: 34475 U/L — ABNORMAL HIGH (ref 49–397)
Total CK: 26831 U/L — ABNORMAL HIGH (ref 49–397)

## 2021-06-08 LAB — CBC
HCT: 39.5 % (ref 39.0–52.0)
Hemoglobin: 13.5 g/dL (ref 13.0–17.0)
MCH: 31.3 pg (ref 26.0–34.0)
MCHC: 34.2 g/dL (ref 30.0–36.0)
MCV: 91.4 fL (ref 80.0–100.0)
Platelets: 191 10*3/uL (ref 150–400)
RBC: 4.32 MIL/uL (ref 4.22–5.81)
RDW: 12.6 % (ref 11.5–15.5)
WBC: 8.9 10*3/uL (ref 4.0–10.5)
nRBC: 0 % (ref 0.0–0.2)

## 2021-06-08 LAB — HIV ANTIBODY (ROUTINE TESTING W REFLEX): HIV Screen 4th Generation wRfx: NONREACTIVE

## 2021-06-08 MED ORDER — ADULT MULTIVITAMIN W/MINERALS CH
1.0000 | ORAL_TABLET | Freq: Every day | ORAL | Status: DC
  Administered 2021-06-08 – 2021-06-10 (×3): 1 via ORAL
  Filled 2021-06-08 (×3): qty 1

## 2021-06-08 MED ORDER — ONDANSETRON HCL 4 MG/2ML IJ SOLN: 4.0000 mg | Freq: Four times a day (QID) | INTRAMUSCULAR | Status: DC | PRN

## 2021-06-08 MED ORDER — MORPHINE SULFATE (PF) 2 MG/ML IV SOLN
2.0000 mg | INTRAVENOUS | Status: DC | PRN
  Administered 2021-06-08: 2 mg via INTRAVENOUS
  Filled 2021-06-08: qty 1

## 2021-06-08 MED ORDER — LACTATED RINGERS IV BOLUS
3000.0000 mL | Freq: Once | INTRAVENOUS | Status: AC
  Administered 2021-06-08: 3000 mL via INTRAVENOUS

## 2021-06-08 MED ORDER — MAGNESIUM HYDROXIDE 400 MG/5ML PO SUSP: 30.0000 mL | Freq: Every day | ORAL | Status: DC | PRN

## 2021-06-08 MED ORDER — ACETAMINOPHEN 650 MG RE SUPP: 650.0000 mg | Freq: Four times a day (QID) | RECTAL | Status: DC | PRN

## 2021-06-08 MED ORDER — BUSPIRONE HCL 5 MG PO TABS: 10.0000 mg | ORAL_TABLET | Freq: Three times a day (TID) | ORAL | Status: DC

## 2021-06-08 MED ORDER — MORPHINE SULFATE (PF) 2 MG/ML IV SOLN
2.0000 mg | INTRAVENOUS | Status: DC | PRN
  Administered 2021-06-08 – 2021-06-09 (×4): 2 mg via INTRAVENOUS
  Filled 2021-06-08 (×4): qty 1

## 2021-06-08 MED ORDER — LACTATED RINGERS IV SOLN: INTRAVENOUS | Status: AC

## 2021-06-08 MED ORDER — LACTATED RINGERS IV SOLN: INTRAVENOUS | Status: DC

## 2021-06-08 MED ORDER — TRAZODONE HCL 50 MG PO TABS: 50.0000 mg | ORAL_TABLET | Freq: Every day | ORAL | Status: DC

## 2021-06-08 MED ORDER — ONDANSETRON HCL 4 MG PO TABS: 4.0000 mg | ORAL_TABLET | Freq: Four times a day (QID) | ORAL | Status: DC | PRN

## 2021-06-08 MED ORDER — TRAZODONE HCL 50 MG PO TABS: 25.0000 mg | ORAL_TABLET | Freq: Every evening | ORAL | Status: DC | PRN

## 2021-06-08 MED ORDER — FENTANYL CITRATE PF 50 MCG/ML IJ SOSY
25.0000 ug | PREFILLED_SYRINGE | INTRAMUSCULAR | Status: DC | PRN
  Administered 2021-06-08: 25 ug via INTRAVENOUS
  Filled 2021-06-08: qty 1

## 2021-06-08 MED ORDER — ENOXAPARIN SODIUM 40 MG/0.4ML IJ SOSY
40.0000 mg | PREFILLED_SYRINGE | INTRAMUSCULAR | Status: DC
  Administered 2021-06-08 – 2021-06-10 (×3): 40 mg via SUBCUTANEOUS
  Filled 2021-06-08 (×3): qty 0.4

## 2021-06-08 MED ORDER — KETOROLAC TROMETHAMINE 30 MG/ML IJ SOLN
30.0000 mg | Freq: Once | INTRAMUSCULAR | Status: AC
  Administered 2021-06-08: 30 mg via INTRAVENOUS
  Filled 2021-06-08: qty 1

## 2021-06-08 MED ORDER — LIDOCAINE 5 % EX PTCH: 1.0000 | MEDICATED_PATCH | Freq: Two times a day (BID) | CUTANEOUS | Status: DC

## 2021-06-08 MED ORDER — HYDROMORPHONE HCL 1 MG/ML IJ SOLN
1.0000 mg | Freq: Once | INTRAMUSCULAR | Status: AC
  Administered 2021-06-08: 1 mg via INTRAVENOUS
  Filled 2021-06-08: qty 1

## 2021-06-08 MED ORDER — ACETAMINOPHEN 325 MG PO TABS
650.0000 mg | ORAL_TABLET | Freq: Four times a day (QID) | ORAL | Status: DC | PRN
  Administered 2021-06-08 – 2021-06-09 (×2): 650 mg via ORAL
  Filled 2021-06-08 (×3): qty 2

## 2021-06-08 MED ORDER — SODIUM CHLORIDE 0.9 % IV SOLN: INTRAVENOUS | Status: DC

## 2021-06-08 MED ORDER — OXYCODONE HCL 5 MG PO TABS
5.0000 mg | ORAL_TABLET | Freq: Four times a day (QID) | ORAL | Status: DC | PRN
  Administered 2021-06-08 – 2021-06-09 (×3): 5 mg via ORAL
  Filled 2021-06-08 (×4): qty 1

## 2021-06-08 NOTE — Progress Notes (Signed)
Admission profile updated. ?

## 2021-06-08 NOTE — H&P (Signed)
?  ?  ?[place] ? ? ?PATIENT NAME: Roberto Stanton   ? ?MR#:  [id number] ? ?DATE OF BIRTH:  01/28/1996 ? ?DATE OF ADMISSION:  06/08/2021 ? ?PRIMARY CARE PHYSICIAN: Gildardo Pounds, PA  ? ?Patient is coming from: Home ? ?REQUESTING/REFERRING PHYSICIAN: Ward, Layla Maw, DO ? ?CHIEF COMPLAINT:  ? ?Chief Complaint  ?Patient presents with  ? Muscle Pain  ? ? ?HISTORY OF PRESENT ILLNESS:  ?Roberto Stanton is a 26 y.o. male with medical history significant for rhabdomyolysis and urolithiasis, who presented to the ER with acute onset of generalized weakness as well as diffuse myalgia.  He works as a Education administrator and denied any excessive work more than his usual or heat exposure.  Denies any nausea or vomiting or diarrhea.  No fever or chills.  No dysuria, oliguria or hematuria or flank pain.  No chest pain or palpitations. ? ?ED Course: When the patient came to the ER BP was 124/100 with a heart rate of 106.  BMP showed calcium of 8.8 and his CK was 28,047.  CBC showed leukocytosis 12.6 with neutrophilia. ? ?The patient was given 30 mg of IV Toradol, 3 L bolus of IV lactated Ringer followed by 150 mill per hour.  He will be admitted to a medical bed for further evaluation and management. ? ?PAST MEDICAL HISTORY:  ? ?Past Medical History:  ?Diagnosis Date  ? Kidney stones   ? Pericarditis   ? Rhabdomyolysis   ? ? ?PAST SURGICAL HISTORY:  ?History reviewed. No pertinent surgical history.  He denies any previous surgeries ? ?SOCIAL HISTORY:  ? ?Social History  ? ?Tobacco Use  ? Smoking status: Former  ? Smokeless tobacco: Never  ?Substance Use Topics  ? Alcohol use: Yes  ?  Comment: occasional  ? ? ?FAMILY HISTORY:  ?No family history on file.  He denies any familial diseases. ? ?DRUG ALLERGIES:  ? ?Allergies  ?Allergen Reactions  ? Chlorhexidine Rash  ? ? ?REVIEW OF SYSTEMS:  ? ?ROS ?As per history of present illness. All pertinent systems were reviewed above. Constitutional, HEENT, cardiovascular, respiratory, GI, GU,  musculoskeletal, neuro, psychiatric, endocrine, integumentary and hematologic systems were reviewed and are otherwise negative/unremarkable except for positive findings mentioned above in the HPI. ? ? ?MEDICATIONS AT HOME:  ? ?Prior to Admission medications   ?Medication Sig Start Date End Date Taking? Authorizing Provider  ?lidocaine (LIDODERM) 5 % Place 1 patch onto the skin every 12 (twelve) hours. Remove & Discard patch within 12 hours or as directed by MD 11/23/20  Yes Sharman Cheek, MD  ?Multiple Vitamin (MULTIVITAMIN) tablet Take 1 tablet by mouth daily. Taking 1 tablet daily   Yes [provider]  ?naproxen sodium (ALEVE) 220 MG tablet Take 220 mg by mouth 2 (two) times daily as needed.   Yes [provider]  ?busPIRone (BUSPAR) 10 MG tablet Take 1 tablet (10 mg total) by mouth 3 (three) times daily. ?Patient not taking: Reported on 06/08/2021 01/08/20   Rhetta Mura, MD  ?ketorolac (TORADOL) 10 MG tablet Take 1 tablet (10 mg total) by mouth every 6 (six) hours as needed for moderate pain. 11/23/20   Sharman Cheek, MD  ?penicillin v potassium (VEETID) 250 MG tablet Take 1 tablet (250 mg total) by mouth 4 (four) times daily. ?Patient not taking: Reported on 06/08/2021 01/18/20   Jene Every, MD  ?traZODone (DESYREL) 50 MG tablet Take 1 tablet (50 mg total) by mouth at bedtime. ?Patient not taking: Reported on 06/08/2021  01/08/20   Rhetta Mura, MD  ? ?  ? ?VITAL SIGNS:  ?Blood pressure 129/81, pulse 85, temperature 97.8 ?F (36.6 ?C), temperature source Oral, resp. rate 16, height 5\' 9"  (1.753 m), weight 62.1 kg, SpO2 100 %. ? ?PHYSICAL EXAMINATION:  ?Physical Exam ? ?GENERAL:  26 y.o.-year-old Caucasian male patient lying in the bed with no acute distress.  ?EYES: Pupils equal, round, reactive to light and accommodation. No scleral icterus. Extraocular muscles intact.  ?HEENT: Head atraumatic, normocephalic. Oropharynx and nasopharynx clear.  ?NECK:  Supple, no jugular  venous distention. No thyroid enlargement, no tenderness.  ?LUNGS: Normal breath sounds bilaterally, no wheezing, rales,rhonchi or crepitation. No use of accessory muscles of respiration.  ?CARDIOVASCULAR: Regular rate and rhythm, S1, S2 normal. No murmurs, rubs, or gallops.  ?ABDOMEN: Soft, nondistended, nontender. Bowel sounds present. No organomegaly or mass.  ?EXTREMITIES: No pedal edema, cyanosis, or clubbing.  ?NEUROLOGIC: Cranial nerves II through XII are intact. Muscle strength 5/5 in all extremities. Sensation intact. Gait not checked.  ?PSYCHIATRIC: The patient is alert and oriented x 3.  Normal affect and good eye contact. ?SKIN: No obvious rash, lesion, or ulcer.  ? ?LABORATORY PANEL:  ? ?CBC ?Recent Labs  ?Lab 06/08/21 ?0439  ?WBC 8.9  ?HGB 13.5  ?HCT 39.5  ?PLT 191  ? ?------------------------------------------------------------------------------------------------------------------ ? ?Chemistries  ?Recent Labs  ?Lab 06/08/21 ?0439  ?NA 137  ?K 3.7  ?CL 105  ?CO2 28  ?GLUCOSE 96  ?BUN 11  ?CREATININE 0.59*  ?CALCIUM 8.6*  ? ?------------------------------------------------------------------------------------------------------------------ ? ?Cardiac Enzymes ?No results for input(s): TROPONINI in the last 168 hours. ?------------------------------------------------------------------------------------------------------------------ ? ?RADIOLOGY:  ?No results found. ? ? ? ?IMPRESSION AND PLAN:  ?Assessment and Plan: ?* Rhabdomyolysis ?- The patient will be admitted to a medical bed. ?- We will continue aggressive hydration with IV normal saline. ?- We will follow serial CK levels. ?- Pain management will be provided. ? ? ? ?DVT prophylaxis: Lovenox.Marland Kitchen  ?Advanced Care Planning:  Code Status: full code.  ?Family Communication:  The plan of care was discussed in details with the patient (and family). ?I answered all questions. The patient agreed to proceed with the above mentioned plan. Further management will  depend upon hospital course. ?Disposition Plan: Back to previous home environment ?Consults called: none.  ?All the records are reviewed and case discussed with ED provider. ? ?Status is: Inpatient ? ?At the time of the admission, it appears that the appropriate admission status for this patient is inpatient.  This is judged to be reasonable and necessary in order to provide the required intensity of service to ensure the patient's safety given the presenting symptoms, physical exam findings and initial radiographic and laboratory data in the context of comorbid conditions.  The patient requires inpatient status due to high intensity of service, high risk of further deterioration and high frequency of surveillance required. ? ?I certify that at the time of admission, it is my clinical judgment that the patient will require inpatient hospital care extending more than 2 midnights. ?              ?             Dispo: The patient is from: Home ?             Anticipated d/c is to: Home ?             Patient currently is not medically stable to d/c. ?  Difficult to place patient: No ? ?Hannah Beat M.D on 06/08/2021 at 8:39 AM ? ?[facility]  ? ?From 7 PM-7 AM, contact night-coverage ?[email or web address] ? ?CC: Primary care physician; Gildardo Pounds, PA ? ?

## 2021-06-08 NOTE — ED Notes (Signed)
Pt c/o of pain rated 9/10 all over. Pt states tylenol does not work for his pain. Dr. Otilio Miu messaged to see if pt can get something more for pain.  ?

## 2021-06-08 NOTE — Assessment & Plan Note (Signed)
-   The patient will be admitted to a medical bed. - We will continue aggressive hydration with IV normal saline. - We will follow serial CK levels. - Pain management will be provided. 

## 2021-06-08 NOTE — Plan of Care (Signed)

## 2021-06-08 NOTE — Progress Notes (Signed)
Admitted early morning hours by nighttime hospitalist.  26 year old gentleman with history of rhabdomyolysis and urolithiasis, he has history of McArdle disease with recurrent rhabdo presented last night after heavy exertional work with whole body ache.  Last admission 6 months ago.  No trauma.  Hemodynamically stable in the ER.  Found with muscle pain and elevated CK level of 27,000 with normal renal functions.  He is admitted with aggressive IV fluid resuscitation for monitoring and treatment. ? ?Patient is clinically improving.  CK level slightly elevated, however this will lag clinical improvement. ?Already received 4 L IV fluid, urine is clear and adequate. ?Continue Ringer lactate 150 mL/h, urine intake and output monitoring.  Pain medications with Tylenol and oxycodone. ?Check CK every 12 hours to ensure improvement. ? ?Total time spent: 25 minutes.  Same-day visit.  No charge. ?

## 2021-06-08 NOTE — ED Provider Notes (Signed)
? ?[facility] ?Provider Note ? ? ? Event Date/Time  ? First MD Initiated Contact with Patient 06/08/21 0120   ?  (approximate) ? ? ?History  ? ?Muscle Pain ? ? ?HPI ? ?Roberto Stanton is a 26 y.o. 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  States he was concerned he was in rhabdo again today.  Denies any trauma, illicit drug use, increased physical exertion.  No vomiting, diarrhea, fever, cough, chest pain or shortness of breath.  No extremity swelling. ? ? ?History provided by patient. ? ? ? ?Past Medical History:  ?Diagnosis Date  ? Kidney stones   ? Pericarditis   ? Rhabdomyolysis   ? ? ?History reviewed. No pertinent surgical history. ? ?MEDICATIONS:  ?Prior to Admission medications   ?Medication Sig Start Date End Date Taking? Authorizing Provider  ?busPIRone (BUSPAR) 10 MG tablet Take 1 tablet (10 mg total) by mouth 3 (three) times daily. 01/08/20   Rhetta Mura, MD  ?ketorolac (TORADOL) 10 MG tablet Take 1 tablet (10 mg total) by mouth every 6 (six) hours as needed for moderate pain. 11/23/20   Sharman Cheek, MD  ?lidocaine (LIDODERM) 5 % Place 1 patch onto the skin every 12 (twelve) hours. Remove & Discard patch within 12 hours or as directed by MD 11/23/20   Sharman Cheek, MD  ?Multiple Vitamin (MULTIVITAMIN) tablet Take 1 tablet by mouth daily. Taking 1 tablet daily    [provider]  ?penicillin v potassium (VEETID) 250 MG tablet Take 1 tablet (250 mg total) by mouth 4 (four) times daily. 01/18/20   Jene Every, MD  ?traZODone (DESYREL) 50 MG tablet Take 1 tablet (50 mg total) by mouth at bedtime. 01/08/20   Rhetta Mura, MD  ? ? ?Physical Exam  ? ?Triage Vital Signs: ?ED Triage Vitals  ?Enc Vitals Group  ?   BP 06/07/21 2311 (!) 124/100  ?   Pulse Rate 06/07/21 2311 (!) 106  ?   Resp 06/07/21 2311  18  ?   Temp 06/07/21 2311 98.2 ?F (36.8 ?C)  ?   Temp Source 06/07/21 2311 Oral  ?   SpO2 06/07/21 2311 99 %  ?   Weight 06/07/21 2311 137 lb (62.1 kg)  ?   Height 06/07/21 2311 5\' 9"  (1.753 m)  ?   Head Circumference --   ?   Peak Flow --   ?   Pain Score 06/07/21 2311 10  ?   Pain Loc --   ?   Pain Edu? --   ?   Excl. in GC? --   ? ? ?Most recent vital signs: ?Vitals:  ? 06/07/21 2311 06/07/21 2311  ?BP:  (!) 124/100  ?Pulse:  (!) 106  ?Resp: 18   ?Temp: 98.2 ?F (36.8 ?C)   ?SpO2:  99%  ? ? ?CONSTITUTIONAL: Alert and oriented and responds appropriately to questions. Well-appearing; well-nourished ?HEAD: Normocephalic, atraumatic ?EYES: Conjunctivae clear, pupils appear equal, sclera nonicteric ?ENT: normal nose; moist mucous membranes ?NECK: Supple, normal ROM ?CARD: RRR; S1 and S2 appreciated; no murmurs, no clicks, no rubs, no gallops ?RESP: Normal chest excursion without splinting or tachypnea; breath sounds clear and equal bilaterally; no wheezes, no rhonchi, no rales, no hypoxia or respiratory distress, speaking full sentences ?ABD/GI: Normal bowel sounds; non-distended; soft, non-tender, no rebound, no guarding, no peritoneal signs ?BACK: The back appears normal ?  EXT: Normal ROM in all joints; no deformity noted, no edema; no cyanosis, no calf tenderness or calf swelling, compartments soft, no joint effusion ?SKIN: Normal color for age and race; warm; no rash on exposed skin ?NEURO: Moves all extremities equally, normal speech ?PSYCH: The patient's mood and manner are appropriate. ? ? ?ED Results / Procedures / Treatments  ? ?LABS: ?(all labs ordered are listed, but only abnormal results are displayed) ?Labs Reviewed  ?CBC WITH DIFFERENTIAL/PLATELET - Abnormal; Notable for the following components:  ?    Result Value  ? WBC 12.6 (*)   ? Neutro Abs 9.2 (*)   ? All other components within normal limits  ?BASIC METABOLIC PANEL - Abnormal; Notable for the following components:  ? Calcium 8.8 (*)   ? All other  components within normal limits  ?CK - Abnormal; Notable for the following components:  ? Total CK 28,047 (*)   ? All other components within normal limits  ?URINALYSIS, ROUTINE W REFLEX MICROSCOPIC  ?URINE DRUG SCREEN, QUALITATIVE ([facility] ONLY)  ?HIV ANTIBODY (ROUTINE TESTING W REFLEX)  ?BASIC METABOLIC PANEL  ?CBC  ?CK  ? ? ? ?EKG: ? EKG Interpretation ? ?Date/Time:    ?Ventricular Rate:    ?PR Interval:    ?QRS Duration:   ?QT Interval:    ?QTC Calculation:   ?R Axis:     ?Text Interpretation:   ?  ? ?  ? ? ? ?RADIOLOGY: ?My personal review and interpretation of imaging:   ? ?I have personally reviewed all radiology reports.   ?No results found. ? ? ?PROCEDURES: ? ?Critical Care performed: Yes, see critical care procedure note(s) ? ? ?CRITICAL CARE ?Performed by: Baxter Hire Adabelle Griffiths ? ? ?Total critical care time: 45 minutes ? ?Critical care time was exclusive of separately billable procedures and treating other patients. ? ?Critical care was necessary to treat or prevent imminent or life-threatening deterioration. ? ?Critical care was time spent personally by me on the following activities: development of treatment plan with patient and/or surrogate as well as nursing, discussions with consultants, evaluation of patient's response to treatment, examination of patient, obtaining history from patient or surrogate, ordering and performing treatments and interventions, ordering and review of laboratory studies, ordering and review of radiographic studies, pulse oximetry and re-evaluation of patient's condition. ? ? ?Procedures ? ? ? ?IMPRESSION / MDM / ASSESSMENT AND PLAN / ED COURSE  ?I reviewed the triage vital signs and the nursing notes. ? ? ? ?Patient here with concerns for rhabdomyolysis.  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  Planing of muscle pain and weakness. ? ?The patient is on the cardiac monitor to evaluate for evidence of arrhythmia and/or significant heart rate  changes. ? ? ?DIFFERENTIAL DIAGNOSIS (includes but not limited to):   Rhabdomyolysis, acute kidney injury, no sign of compartment syndrome, septic arthritis, gout, traumatic injury ? ? ?PLAN: We will obtain CBC, BMP, CK level.  Will give IV fluids, Toradol. ? ? ?MEDICATIONS GIVEN IN ED: ?Medications  ?lactated ringers infusion (has no administration in time range)  ?busPIRone (BUSPAR) tablet 10 mg (has no administration in time range)  ?traZODone (DESYREL) tablet 50 mg (has no administration in time range)  ?multivitamin with minerals tablet 1 tablet (has no administration in time range)  ?lidocaine (LIDODERM) 5 % 1 patch (has no administration in time range)  ?enoxaparin (LOVENOX) injection 40 mg (has no administration in time range)  ?0.9 %  sodium chloride infusion (has no administration in time  range)  ?acetaminophen (TYLENOL) tablet 650 mg (has no administration in time range)  ?  Or  ?acetaminophen (TYLENOL) suppository 650 mg (has no administration in time range)  ?traZODone (DESYREL) tablet 25 mg (has no administration in time range)  ?magnesium hydroxide (MILK OF MAGNESIA) suspension 30 mL (has no administration in time range)  ?ondansetron (ZOFRAN) tablet 4 mg (has no administration in time range)  ?  Or  ?ondansetron (ZOFRAN) injection 4 mg (has no administration in time range)  ?lactated ringers bolus 3,000 mL (3,000 mLs Intravenous New Bag/Given 06/08/21 0251)  ?ketorolac (TORADOL) 30 MG/ML injection 30 mg (30 mg Intravenous Given 06/08/21 0244)  ? ? ? ?ED COURSE: Patient has normal hemoglobin, no leukocytosis.  Creatinine is normal but CK is greater than 20,000.  We will aggressively hydrate patient and discussed with hospitalist for admission for rhabdomyolysis. ? ? ?CONSULTS:  Consulted and discussed patient's case with hospitalist, Dr. Arville CareMansy.  I have recommended admission and consulting physician agrees and will place admission orders.  Patient (and family if present) agree with this plan.  ? ?I  reviewed all nursing notes, vitals, pertinent previous records.  All labs, EKGs, imaging ordered have been independently reviewed and interpreted by myself. ? ? ? ?OUTSIDE RECORDS REVIEWED: Reviewed patient's neurology note

## 2021-06-09 LAB — BASIC METABOLIC PANEL
Anion gap: 6 (ref 5–15)
BUN: 7 mg/dL (ref 6–20)
CO2: 32 mmol/L (ref 22–32)
Calcium: 9.2 mg/dL (ref 8.9–10.3)
Chloride: 102 mmol/L (ref 98–111)
Creatinine, Ser: 0.68 mg/dL (ref 0.61–1.24)
GFR, Estimated: >60 mL/min (ref >60)
Glucose, Bld: 98 mg/dL (ref 70–99)
Potassium: 3.9 mmol/L (ref 3.5–5.1)
Sodium: 140 mmol/L (ref 135–145)

## 2021-06-09 LAB — CK
Total CK: 21797 U/L — ABNORMAL HIGH (ref 49–397)
Total CK: 13767 U/L — ABNORMAL HIGH (ref 49–397)

## 2021-06-09 MED ORDER — CYCLOBENZAPRINE HCL 10 MG PO TABS: 5.0000 mg | ORAL_TABLET | Freq: Three times a day (TID) | ORAL | Status: DC | PRN

## 2021-06-09 MED ORDER — OXYCODONE HCL 5 MG PO TABS
10.0000 mg | ORAL_TABLET | Freq: Four times a day (QID) | ORAL | Status: DC | PRN
  Administered 2021-06-09 – 2021-06-10 (×5): 10 mg via ORAL
  Filled 2021-06-09 (×6): qty 2

## 2021-06-09 NOTE — Progress Notes (Signed)
?PROGRESS NOTE ? ? ? ?Roberto Stanton  QPY:[id number] DOB: Mar 09, 1995 DOA: 06/08/2021 ?PCP: Gildardo Pounds, PA  ? ? ?Brief Narrative:  ? 26 year old gentleman with history of rhabdomyolysis and urolithiasis, he has history of McArdle disease with recurrent rhabdo presented last night after heavy exertional work with whole body ache.  Last admission 6 months ago.  No trauma.  Hemodynamically stable in the ER.  Found with muscle pain and elevated CK level of 27,000 with normal renal functions.  He is admitted with aggressive IV fluid resuscitation for monitoring and treatment. ? ? ?Assessment & Plan: ?  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  Admitted with significant symptoms of pain and creatinine kinase of more than 27,000. ?Continue IV fluids today.  Some clinical improvement.  Urine output is adequate.  Electrolytes are normal.  Renal functions are stable. ?Due to significant symptoms, creatinine kinase level is still more than 20,000, will keep on maintenance IV fluid, intake output monitoring.  Adequate pain medications and mobility. ?Continue checking CK level every 12 hours. ? ? ?DVT prophylaxis: enoxaparin (LOVENOX) injection 40 mg Start: 06/08/21 0800 ? ? ?Code Status: Full code ?Family Communication: None at the bedside ?Disposition Plan: Status is: Inpatient ?Remains inpatient appropriate because: Significantly elevated and abnormal lab test.  Needing IV fluid. ?  ? ? ?Consultants:  ?None ? ?Procedures:  ?None ? ?Antimicrobials:  ?None ? ? ?Subjective: ?Patient seen and examined.  Has some dry cough.  He has significant muscle pain mostly on the anterior chest wall and also on the back.  No other overnight events.  Wanting to use more pain medications. ? ?Objective: ?Vitals:  ? 06/08/21 1943 06/09/21 0031 06/09/21 0505 06/09/21 1245  ?BP: 138/74 113/87 (!) 129/96 118/79  ?Pulse: 78 65 71 82  ?Resp: 17 17 16 16   ?Temp: 98.1 ?F (36.7 ?C) 98 ?F (36.7 ?C) 97.6  ?F (36.4 ?C) 98 ?F (36.7 ?C)  ?TempSrc:      ?SpO2: 100% 99% 100% 100%  ?Weight:      ?Height:      ? ? ?Intake/Output Summary (Last 24 hours) at 06/09/2021 1113 ?Last data filed at 06/09/2021 1100 ?Gross per 24 hour  ?Intake 1648.5 ml  ?Output 3100 ml  ?Net -1451.5 ml  ? ?Filed Weights  ? 06/07/21 2311  ?Weight: 62.1 kg  ? ? ?Examination: ? ?General exam: Appears calm and comfortable at rest. ?Respiratory system: Clear to auscultation. Respiratory effort normal.  No added sounds. ?Cardiovascular system: S1 & S2 heard, RRR.  No edema. ?Gastrointestinal system: Abdomen is nondistended, soft and nontender. No organomegaly or masses felt. Normal bowel sounds heard. ?Central nervous system: Alert and oriented. No focal neurological deficits. ?Extremities: Symmetric 5 x 5 power. ?No deformities or swelling.  Does not have any localized tenderness or muscle swelling. ? ? ? ?Data Reviewed: I have personally reviewed following labs and imaging studies ? ?CBC: ?Recent Labs  ?Lab 06/07/21 ?2314 06/08/21 ?0439  ?WBC 12.6* 8.9  ?NEUTROABS 9.2*  --   ?HGB 15.1 13.5  ?HCT 44.1 39.5  ?MCV 91.3 91.4  ?PLT 229 191  ? ?Basic Metabolic Panel: ?Recent Labs  ?Lab 06/07/21 ?2314 06/08/21 ?06/10/21 06/09/21 ?0444  ?NA 135 137 140  ?K 3.7 3.7 3.9  ?CL 101 105 102  ?CO2 26 28 32  ?GLUCOSE 94 96 98  ?BUN 16 11 7   ?CREATININE 0.79 0.59* 0.68  ?CALCIUM 8.8* 8.6* 9.2  ? ?GFR: ?Estimated Creatinine Clearance: 121.8 mL/min (by C-G formula  based on SCr of 0.68 mg/dL). ?Liver Function Tests: ?No results for input(s): AST, ALT, ALKPHOS, BILITOT, PROT, ALBUMIN in the last 168 hours. ?No results for input(s): LIPASE, AMYLASE in the last 168 hours. ?No results for input(s): AMMONIA in the last 168 hours. ?Coagulation Profile: ?No results for input(s): INR, PROTIME in the last 168 hours. ?Cardiac Enzymes: ?Recent Labs  ?Lab 06/07/21 ?2314 06/08/21 ?0439 06/08/21 ?1750 06/09/21 ?0444  ?CKTOTAL [id number]* 37,858* 85,027* 21,797*  ? ?BNP (last 3 results) ?No  results for input(s): PROBNP in the last 8760 hours. ?HbA1C: ?No results for input(s): HGBA1C in the last 72 hours. ?CBG: ?No results for input(s): GLUCAP in the last 168 hours. ?Lipid Profile: ?No results for input(s): CHOL, HDL, LDLCALC, TRIG, CHOLHDL, LDLDIRECT in the last 72 hours. ?Thyroid Function Tests: ?No results for input(s): TSH, T4TOTAL, FREET4, T3FREE, THYROIDAB in the last 72 hours. ?Anemia Panel: ?No results for input(s): VITAMINB12, FOLATE, FERRITIN, TIBC, IRON, RETICCTPCT in the last 72 hours. ?Sepsis Labs: ?No results for input(s): PROCALCITON, LATICACIDVEN in the last 168 hours. ? ?No results found for this or any previous visit (from the past 240 hour(s)).  ? ? ? ? ? ?Radiology Studies: ?No results found. ? ? ? ? ? ?Scheduled Meds: ? enoxaparin (LOVENOX) injection  40 mg Subcutaneous Q24H  ? multivitamin with minerals  1 tablet Oral Daily  ? ?Continuous Infusions: ? lactated ringers 150 mL/hr at 06/09/21 7412  ? ? ? LOS: 1 day  ? ? ?Time spent: 35 minutes ? ? ? ?Dorcas Carrow, MD ?[facility] ?Pager [phone] ? ?

## 2021-06-10 LAB — CK: Total CK: 11125 U/L — ABNORMAL HIGH (ref 49–397)

## 2021-06-10 MED ORDER — LACTATED RINGERS IV SOLN: INTRAVENOUS | Status: DC

## 2021-06-10 MED ORDER — OXYCODONE HCL 5 MG PO TABS: 5.0000 mg | ORAL_TABLET | ORAL | 0 refills | Status: AC | PRN

## 2021-06-10 MED ORDER — ACETAMINOPHEN 325 MG PO TABS: 650.0000 mg | ORAL_TABLET | Freq: Four times a day (QID) | ORAL | Status: DC | PRN

## 2021-06-10 NOTE — Discharge Summary (Signed)
Physician Discharge Summary  ?Roberto Stanton ZMO:[id number] DOB: 04-05-1995 DOA: 06/08/2021 ? ?PCP: Roberto Pounds, PA ? ?Admit date: 06/08/2021 ?Discharge date: 06/10/2021 ? ?Admitted From: Home ?Disposition: Home ? ?Recommendations for Outpatient Follow-up:  ?Follow up with PCP in 1-2 weeks ?Please obtain BMP/CBC/CK in one week ? ? ?Home Health: N/A ?Equipment/Devices: N/A ? ?Discharge Condition: Stable ?CODE STATUS: Full code ?Diet recommendation: Regular diet, plenty of fluid intake. ? ?Discharge summary: ?26 year old gentleman with history of rhabdomyolysis and urolithiasis, he has history of Roberto Stanton disease with recurrent rhabdo presented after heavy exertional work with whole body ache.  Last admission 6 months ago.  No trauma.  Hemodynamically stable in the ER.  Found with muscle pain and elevated CK level of 27,000 with normal renal functions.  He is admitted with aggressive IV fluid resuscitation for monitoring and treatment. ?  ?  ?Acute nontraumatic rhabdomyolysis due to Roberto Stanton disease, glycogen-storage disorder.  ?Admitted with significant symptoms of pain and creatinine kinase of more than 27,000-34000. ?Patient was treated with aggressive IV fluids and maintenance fluid for 48 hours with appropriate downtrending creatinine kinase.  Renal functions and electrolytes remained stable.  Urine output is adequate.  He remained in the hospital due to significant symptoms and CK levels on IV fluids, however today he is able to maintain adequate hydration and desires to go home. ?Though he is CK level is still high, however he is able to maintain oral hydration, electrolytes and renal functions are stable, discharged on his request. ?Patient will avoid any heavy exertion.  He will continue to take 3 to 4 L of fluid in a day.  Monitor for any symptoms of worsening pain, muscle swelling or change in color of urine. ?Will benefit with recheck in about a week. ?Due to significant pain, was prescribed a short course  of opioid therapy for pain relief. ?He does have a neurologist for follow-up, however he is under surveillance only. ? ? ?Discharge Diagnoses:  ?Principal Problem: ?  Rhabdomyolysis ? ? ? ?Discharge Instructions ? ?Discharge Instructions   ? ? Call MD for:  severe uncontrolled pain   Complete by: As directed ?  ? Diet - low sodium heart healthy   Complete by: As directed ?  ? Drink plenty of water  ? Increase activity slowly   Complete by: As directed ?  ? ?  ? ?Allergies as of 06/10/2021   ? ?   Reactions  ? Chlorhexidine Rash  ? ?  ? ?  ?Medication List  ?  ? ?STOP taking these medications   ? ?busPIRone 10 MG tablet ?Commonly known as: BUSPAR ?  ?ketorolac 10 MG tablet ?Commonly known as: TORADOL ?  ?lidocaine 5 % ?Commonly known as: Lidoderm ?  ?multivitamin tablet ?  ?naproxen sodium 220 MG tablet ?Commonly known as: ALEVE ?  ?penicillin v potassium 250 MG tablet ?Commonly known as: VEETID ?  ?traZODone 50 MG tablet ?Commonly known as: DESYREL ?  ? ?  ? ?TAKE these medications   ? ?acetaminophen 325 MG tablet ?Commonly known as: TYLENOL ?Take 2 tablets (650 mg total) by mouth every 6 (six) hours as needed for mild pain (or Fever >/= 101). ?  ?oxyCODONE 5 MG immediate release tablet ?Commonly known as: Roxicodone ?Take 1 tablet (5 mg total) by mouth every 4 (four) hours as needed for up to 3 days for severe pain. ?  ? ?  ? ? ?Allergies  ?Allergen Reactions  ? Chlorhexidine Rash  ? ? ?Consultations: ?None ? ? ?  Procedures/Studies: ?No results found. ?(Echo, Carotid, EGD, Colonoscopy, ERCP)  ? ? ?Subjective: Patient seen and examined.  Does have some back pain and arm pain but no swelling or deformity.  Denies any nausea vomiting.  Urinating with normal color urine.  Desires to go home and recover at home. ? ? ?Discharge Exam: ?Vitals:  ? 06/10/21 0351 06/10/21 0807  ?BP: 106/68 114/70  ?Pulse: 69 88  ?Resp: 16 16  ?Temp: 97.7 ?F (36.5 ?C) 98 ?F (36.7 ?C)  ?SpO2: 100% 97%  ? ?Vitals:  ? 06/09/21 1526 06/09/21 1933  06/10/21 0351 06/10/21 0807  ?BP: 117/73 138/82 106/68 114/70  ?Pulse: 79 71 69 88  ?Resp: 18 16 16 16   ?Temp: 98 ?F (36.7 ?C) 98.2 ?F (36.8 ?C) 97.7 ?F (36.5 ?C) 98 ?F (36.7 ?C)  ?TempSrc:      ?SpO2: 100% 100% 100% 97%  ?Weight:      ?Height:      ? ? ?General: Pt is alert, awake, not in acute distress ?Cardiovascular: RRR, S1/S2 +, no rubs, no gallops ?Respiratory: CTA bilaterally, no wheezing, no rhonchi ?Abdominal: Soft, NT, ND, bowel sounds + ?Extremities: no edema, no cyanosis ?Does not have any swelling or deformity of the joints or muscles.  No point tenderness. ? ? ? ?The results of significant diagnostics from this hospitalization (including imaging, microbiology, ancillary and laboratory) are listed below for reference.   ? ? ?Microbiology: ?No results found for this or any previous visit (from the past 240 hour(s)).  ? ?Labs: ?BNP (last 3 results) ?No results for input(s): BNP in the last 8760 hours. ?Basic Metabolic Panel: ?Recent Labs  ?Lab 06/07/21 ?2314 06/08/21 ?[id number] 06/09/21 ?0444  ?NA 135 137 140  ?K 3.7 3.7 3.9  ?CL 101 105 102  ?CO2 26 28 32  ?GLUCOSE 94 96 98  ?BUN 16 11 7   ?CREATININE 0.79 0.59* 0.68  ?CALCIUM 8.8* 8.6* 9.2  ? ?Liver Function Tests: ?No results for input(s): AST, ALT, ALKPHOS, BILITOT, PROT, ALBUMIN in the last 168 hours. ?No results for input(s): LIPASE, AMYLASE in the last 168 hours. ?No results for input(s): AMMONIA in the last 168 hours. ?CBC: ?Recent Labs  ?Lab 06/07/21 ?2314 06/08/21 ?0439  ?WBC 12.6* 8.9  ?NEUTROABS 9.2*  --   ?HGB 15.1 13.5  ?HCT 44.1 39.5  ?MCV 91.3 91.4  ?PLT 229 191  ? ?Cardiac Enzymes: ?Recent Labs  ?Lab 06/08/21 ?0439 06/08/21 ?1750 06/09/21 ?0444 06/09/21 ?1632 06/10/21 ?0449  ?CKTOTAL 96,04534,475* [id number]* 21,797* 40,981* 13,767* 11,125*  ? ?BNP: ?Invalid input(s): POCBNP ?CBG: ?No results for input(s): GLUCAP in the last 168 hours. ?D-Dimer ?No results for input(s): DDIMER in the last 72 hours. ?Hgb A1c ?No results for input(s): HGBA1C in the last 72  hours. ?Lipid Profile ?No results for input(s): CHOL, HDL, LDLCALC, TRIG, CHOLHDL, LDLDIRECT in the last 72 hours. ?Thyroid function studies ?No results for input(s): TSH, T4TOTAL, T3FREE, THYROIDAB in the last 72 hours. ? ?Invalid input(s): FREET3 ?Anemia work up ?No results for input(s): VITAMINB12, FOLATE, FERRITIN, TIBC, IRON, RETICCTPCT in the last 72 hours. ?Urinalysis ?   ?Component Value Date/Time  ? COLORURINE COLORLESS (A) 06/08/2021 0438  ? APPEARANCEUR CLEAR (A) 06/08/2021 0438  ? LABSPEC 1.003 (L) 06/08/2021 0438  ? PHURINE 7.0 06/08/2021 0438  ? GLUCOSEU NEGATIVE 06/08/2021 0438  ? HGBUR NEGATIVE 06/08/2021 0438  ? BILIRUBINUR NEGATIVE 06/08/2021 0438  ? KETONESUR NEGATIVE 06/08/2021 0438  ? PROTEINUR NEGATIVE 06/08/2021 0438  ? NITRITE NEGATIVE 06/08/2021 0438  ? LEUKOCYTESUR NEGATIVE 06/08/2021  0109  ? ?Sepsis Labs ?Invalid input(s): PROCALCITONIN,  WBC,  LACTICIDVEN ?Microbiology ?No results found for this or any previous visit (from the past 240 hour(s)). ? ? ?Time coordinating discharge: 40 minutes ? ?SIGNED: ? ? ?Dorcas Carrow, MD  ?[facility] ?06/10/2021, 10:10 AM ? ?

## 2021-06-10 NOTE — Plan of Care (Signed)
Patient discharged home per MD orders at this time.All discharge instructions,education and medications reviewed with the Pt.patient expressed understanding and will comply with dc instructions.follow up appointments was also communicated to the patient.no verbal c/o or any ssx ?or distress.Pt was transported home by family in a privately owned vehicle. ?

## 2021-09-26 ENCOUNTER — Other Ambulatory Visit: Payer: Self-pay

## 2021-09-26 ENCOUNTER — Inpatient Hospital Stay
Admission: EM | Admit: 2021-09-26 | Discharge: 2021-09-28 | DRG: 558 | Disposition: A | Discharge status: Home / Self Care | Payer: Self-pay | Attending: Osteopathic Medicine | Admitting: Osteopathic Medicine

## 2021-09-26 ENCOUNTER — Inpatient Hospital Stay: Payer: Self-pay

## 2021-09-26 DIAGNOSIS — M6282 Rhabdomyolysis: Principal | ICD-10-CM | POA: Diagnosis present

## 2021-09-26 DIAGNOSIS — Z888 Allergy status to other drugs, medicaments and biological substances status: Secondary | ICD-10-CM

## 2021-09-26 DIAGNOSIS — Z87442 Personal history of urinary calculi: Secondary | ICD-10-CM

## 2021-09-26 DIAGNOSIS — E7404 McArdle disease: Secondary | ICD-10-CM | POA: Diagnosis present

## 2021-09-26 DIAGNOSIS — R0981 Nasal congestion: Secondary | ICD-10-CM | POA: Diagnosis present

## 2021-09-26 DIAGNOSIS — R7401 Elevation of levels of liver transaminase levels: Secondary | ICD-10-CM | POA: Diagnosis present

## 2021-09-26 DIAGNOSIS — M7989 Other specified soft tissue disorders: Secondary | ICD-10-CM

## 2021-09-26 DIAGNOSIS — Z87891 Personal history of nicotine dependence: Secondary | ICD-10-CM

## 2021-09-26 DIAGNOSIS — M79661 Pain in right lower leg: Secondary | ICD-10-CM

## 2021-09-26 LAB — URINALYSIS, ROUTINE W REFLEX MICROSCOPIC
Bilirubin Urine: NEGATIVE
Glucose, UA: NEGATIVE mg/dL
Hgb urine dipstick: NEGATIVE
Ketones, ur: NEGATIVE mg/dL
Leukocytes,Ua: NEGATIVE
Nitrite: NEGATIVE
Protein, ur: NEGATIVE mg/dL
Specific Gravity, Urine: 1.021 (ref 1.005–1.030)
pH: 6 (ref 5.0–8.0)

## 2021-09-26 LAB — CBC
HCT: 47.2 % (ref 39.0–52.0)
Hemoglobin: 15.9 g/dL (ref 13.0–17.0)
MCH: 31.6 pg (ref 26.0–34.0)
MCHC: 33.7 g/dL (ref 30.0–36.0)
MCV: 93.8 fL (ref 80.0–100.0)
Platelets: 241 10*3/uL (ref 150–400)
RBC: 5.03 MIL/uL (ref 4.22–5.81)
RDW: 13.2 % (ref 11.5–15.5)
WBC: 6.2 10*3/uL (ref 4.0–10.5)
nRBC: 0 % (ref 0.0–0.2)

## 2021-09-26 LAB — COMPREHENSIVE METABOLIC PANEL
ALT: 230 U/L — ABNORMAL HIGH (ref 0–44)
AST: 484 U/L — ABNORMAL HIGH (ref 15–41)
Albumin: 4 g/dL (ref 3.5–5.0)
Alkaline Phosphatase: 80 U/L (ref 38–126)
Anion gap: 10 (ref 5–15)
BUN: 13 mg/dL (ref 6–20)
CO2: 27 mmol/L (ref 22–32)
Calcium: 9.3 mg/dL (ref 8.9–10.3)
Chloride: 101 mmol/L (ref 98–111)
Creatinine, Ser: 0.84 mg/dL (ref 0.61–1.24)
GFR, Estimated: >60 mL/min (ref >60)
Glucose, Bld: 96 mg/dL (ref 70–99)
Potassium: 3.7 mmol/L (ref 3.5–5.1)
Sodium: 138 mmol/L (ref 135–145)
Total Bilirubin: 1 mg/dL (ref 0.3–1.2)
Total Protein: 8.4 g/dL — ABNORMAL HIGH (ref 6.5–8.1)

## 2021-09-26 LAB — RESPIRATORY PANEL BY PCR

## 2021-09-26 LAB — CK: Total CK: 49793 U/L — ABNORMAL HIGH (ref 49–397)

## 2021-09-26 MED ORDER — OXYCODONE-ACETAMINOPHEN 5-325 MG PO TABS: 2.0000 | ORAL_TABLET | Freq: Four times a day (QID) | ORAL | Status: DC | PRN

## 2021-09-26 MED ORDER — OXYCODONE HCL 5 MG PO TABS
10.0000 mg | ORAL_TABLET | Freq: Four times a day (QID) | ORAL | Status: DC | PRN
  Administered 2021-09-26 – 2021-09-28 (×8): 10 mg via ORAL
  Filled 2021-09-26 (×8): qty 2

## 2021-09-26 MED ORDER — CYCLOBENZAPRINE HCL 10 MG PO TABS
5.0000 mg | ORAL_TABLET | Freq: Three times a day (TID) | ORAL | Status: DC
  Administered 2021-09-26: 5 mg via ORAL
  Filled 2021-09-26 (×2): qty 1

## 2021-09-26 MED ORDER — SODIUM CHLORIDE 0.9 % IV SOLN: 250.0000 mL | INTRAVENOUS | Status: DC | PRN

## 2021-09-26 MED ORDER — ONDANSETRON HCL 4 MG PO TABS: 4.0000 mg | ORAL_TABLET | Freq: Four times a day (QID) | ORAL | Status: DC | PRN

## 2021-09-26 MED ORDER — OXYCODONE HCL 5 MG PO TABS
5.0000 mg | ORAL_TABLET | Freq: Once | ORAL | Status: AC
  Administered 2021-09-26: 5 mg via ORAL
  Filled 2021-09-26: qty 1

## 2021-09-26 MED ORDER — ONDANSETRON HCL 4 MG/2ML IJ SOLN: 4.0000 mg | Freq: Four times a day (QID) | INTRAMUSCULAR | Status: DC | PRN

## 2021-09-26 MED ORDER — SODIUM CHLORIDE 0.9 % IV SOLN: INTRAVENOUS | Status: DC

## 2021-09-26 MED ORDER — ACETAMINOPHEN 650 MG RE SUPP: 650.0000 mg | Freq: Four times a day (QID) | RECTAL | Status: DC | PRN

## 2021-09-26 MED ORDER — SODIUM CHLORIDE 0.9% FLUSH
3.0000 mL | Freq: Two times a day (BID) | INTRAVENOUS | Status: DC
Start: 2021-09-26 — End: 2021-09-28
  Administered 2021-09-26 – 2021-09-28 (×2): 3 mL via INTRAVENOUS

## 2021-09-26 MED ORDER — SODIUM CHLORIDE 0.9% FLUSH: 3.0000 mL | INTRAVENOUS | Status: DC | PRN

## 2021-09-26 MED ORDER — ACETAMINOPHEN 325 MG PO TABS: 650.0000 mg | ORAL_TABLET | Freq: Four times a day (QID) | ORAL | Status: DC | PRN

## 2021-09-26 MED ORDER — SODIUM CHLORIDE 0.9 % IV BOLUS
1000.0000 mL | Freq: Once | INTRAVENOUS | Status: AC
  Administered 2021-09-26: 1000 mL via INTRAVENOUS

## 2021-09-26 MED ORDER — ENOXAPARIN SODIUM 40 MG/0.4ML IJ SOSY
40.0000 mg | PREFILLED_SYRINGE | INTRAMUSCULAR | Status: DC
  Administered 2021-09-26 – 2021-09-27 (×2): 40 mg via SUBCUTANEOUS
  Filled 2021-09-26 (×2): qty 0.4

## 2021-09-26 NOTE — ED Triage Notes (Signed)
Pt presents to ER with c/o right leg and lower back pain that started after pt states he "over did it at work."  Pt states he has a hx of muscle disease and endorses some dark colored urine.  Pt is A&O x4 at this time in NAD in triage.

## 2021-09-26 NOTE — TOC Initial Note (Signed)
Transition of Care [facility]) - Initial/Assessment Note    Patient Details  Name: Roberto Stanton MRN: [id number] Date of Birth: 15-Jun-1995  [facility]) CM/SW Contact:    Chapman Fitch, RN Phone Number: 09/26/2021, 2:44 PM  Clinical Narrative:                   Transition of Care [facility]) Screening Note   Patient Details  Name: Roberto Stanton Date of Birth: 1995-10-03   Transition of Care [facility]) CM/SW Contact:    Chapman Fitch, RN Phone Number: 09/26/2021, 2:45 PM    Transition of Care Department [facility]) has reviewed patient and no TOC needs have been identified at this time. We will continue to monitor patient advancement through interdisciplinary progression rounds. If new patient transition needs arise, please place a TOC consult.  TOC available for DC medications if indicated        Patient Goals and CMS Choice        Expected Discharge Plan and Services                                                Prior Living Arrangements/Services                       Activities of Daily Living Home Assistive Devices/Equipment: None ADL Screening (condition at time of admission) Patient's cognitive ability adequate to safely complete daily activities?: Yes Is the patient deaf or have difficulty hearing?: No Does the patient have difficulty seeing, even when wearing glasses/contacts?: No Does the patient have difficulty concentrating, remembering, or making decisions?: No Patient able to express need for assistance with ADLs?: Yes Does the patient have difficulty dressing or bathing?: No Independently performs ADLs?: Yes (appropriate for developmental age) Does the patient have difficulty walking or climbing stairs?: No Weakness of Legs: None Weakness of Arms/Hands: None  Permission Sought/Granted                  Emotional Assessment              Admission diagnosis:  Rhabdomyolysis [M62.82] Pain and swelling of right  lower leg [M79.661, M79.89] Patient Active Problem List   Diagnosis Date Noted   Transaminitis 09/26/2021   Rhabdomyolysis 06/08/2021   Acute respiratory failure (HCC) 01/08/2020   PCP:  Gildardo Pounds, PA Pharmacy:   [facility] [phone] - [place], [place] - [address] [place] [address] Phone: [phone] Fax: [phone]     Social Determinants of Health (SDOH) Interventions    Readmission Risk Interventions     No data to display

## 2021-09-26 NOTE — ED Notes (Signed)
Pt stated that he pulled a muscle pretty badly at work yesterday. Pt stated his lower back is on fire and he has pain in his right leg. Pt also stated he is peeing dark.

## 2021-09-26 NOTE — Assessment & Plan Note (Addendum)
Patient with a history of McArdle's disease and recurrent nontraumatic rhabdomyolysis who presents to the ER for evaluation of myalgias but worse in his lower back and right leg. Noted to have significantly elevated total CK levels over 49,000 on admission  Aggressive IV fluid hydration  Symptom control  Check daily CK level

## 2021-09-26 NOTE — ED Notes (Signed)
Pt given urine specimen cup and advised that we need a specimen when he is able to provide one.

## 2021-09-26 NOTE — ED Provider Notes (Signed)
[facility] Provider Note    None    (approximate)   History   Muscle Pain   HPI  Roberto Stanton is a 26 y.o. male   presents to the ED with complaint of generalized body aches but worse with his lower extremities.  Patient states he "must of overdone it at work yesterday".  He states he began having some leg discomfort yesterday while walking uphill.  He reports that he does drink fluids frequently when he is working outside.  He denies use of recreational drugs, occasionally vapes and no alcohol use for the last 2 weeks.  He states when he does drink he occasionally drinks 2 beers.  Patient  was hospitalized April/20/23 for rhabdomyolysis.  His initial CK was 34,475 at that time.      Physical Exam   Triage Vital Signs: ED Triage Vitals  Enc Vitals Group     BP 09/26/21 0705 (!) 140/96     Pulse Rate 09/26/21 0705 98     Resp 09/26/21 0705 18     Temp 09/26/21 0705 97.7 F (36.5 C)     Temp Source 09/26/21 0705 Oral     SpO2 09/26/21 0705 98 %     Weight 09/26/21 0700 150 lb (68 kg)     Height 09/26/21 0700 5\' 9"  (1.753 m)     Head Circumference --      Peak Flow --      Pain Score 09/26/21 0700 9     Pain Loc --      Pain Edu? --      Excl. in GC? --     Most recent vital signs: Vitals:   09/26/21 1000 09/26/21 1114  BP: 127/87 122/81  Pulse: 90 73  Resp: 18 18  Temp:  98.3 F (36.8 C)  SpO2: 97% 98%     General: Awake, no distress.  CV:  Good peripheral perfusion.  Resp:  Normal effort.  Lungs are clear bilaterally. Abd:  No distention.  Soft, nontender bowel sounds are present x4 quadrants. Other:  Moderate tenderness on palpation especially of the right lower extremity and firm to touch.  No erythema or warmth is appreciated.  Patient has generalized muscle and aches.  Range of motion guarded secondary to increased pain.   ED Results / Procedures / Treatments   Labs (all labs ordered are listed, but only abnormal results  are displayed) Labs Reviewed  COMPREHENSIVE METABOLIC PANEL - Abnormal; Notable for the following components:      Result Value   Total Protein 8.4 (*)    AST 484 (*)    ALT 230 (*)    All other components within normal limits  URINALYSIS, ROUTINE W REFLEX MICROSCOPIC - Abnormal; Notable for the following components:   Color, Urine YELLOW (*)    APPearance CLEAR (*)    All other components within normal limits  CK - Abnormal; Notable for the following components:   Total CK 49,793 (*)    All other components within normal limits  RESPIRATORY PANEL BY PCR  CBC       PROCEDURES:  Critical Care performed:   Procedures   MEDICATIONS ORDERED IN ED: Medications  0.9 %  sodium chloride infusion ( Intravenous New Bag/Given 09/26/21 1250)  cyclobenzaprine (FLEXERIL) tablet 5 mg (5 mg Oral Given 09/26/21 1101)  enoxaparin (LOVENOX) injection 40 mg (has no administration in time range)  sodium chloride flush (NS) 0.9 % injection 3 mL (3  mLs Intravenous Not Given 09/26/21 1256)  sodium chloride flush (NS) 0.9 % injection 3 mL (has no administration in time range)  0.9 %  sodium chloride infusion (has no administration in time range)  acetaminophen (TYLENOL) tablet 650 mg (has no administration in time range)    Or  acetaminophen (TYLENOL) suppository 650 mg (has no administration in time range)  ondansetron (ZOFRAN) tablet 4 mg (has no administration in time range)    Or  ondansetron (ZOFRAN) injection 4 mg (has no administration in time range)  oxyCODONE (Oxy IR/ROXICODONE) immediate release tablet 10 mg (10 mg Oral Given 09/26/21 1251)  sodium chloride 0.9 % bolus 1,000 mL (0 mLs Intravenous Stopped 09/26/21 0901)  oxyCODONE (Oxy IR/ROXICODONE) immediate release tablet 5 mg (5 mg Oral Given 09/26/21 0930)  sodium chloride 0.9 % bolus 1,000 mL (1,000 mLs Intravenous New Bag/Given 09/26/21 0934)     IMPRESSION / MDM / ASSESSMENT AND PLAN / ED COURSE  I reviewed the triage vital signs and the  nursing notes.   Differential diagnosis includes, but is not limited to, dehydration, muscle skeletal pain, rhabdomyolysis, viral infection.  26 year old male presents to the ED with complaint of muscle aches especially to his lower extremities after working yesterday.  Patient has a history of rhabdomyolysis and has been admitted for the same.  The most recent admission was April 2023.  WBC was reassuring at 6.2, urinalysis was negative, CMP showed liver functions elevated with an AST of 484 and ALT 230.  CK total 49,793.  Patient was given IV fluids while in the ED along with oxycodone p.o.  Arrangements were made for admission and patient was made aware.  He is agreeable to this plan as he has had to be hospitalized before for the same.      Patient's presentation is most consistent with acute illness / injury with system symptoms.  FINAL CLINICAL IMPRESSION(S) / ED DIAGNOSES   Final diagnoses:  Non-traumatic rhabdomyolysis     Rx / DC Orders   ED Discharge Orders     None        Note:  This document was prepared using Dragon voice recognition software and may include unintentional dictation errors.   Tommi Rumps, PA-C 09/26/21 1305    Sharyn Creamer, MD 10/10/21 [phone]

## 2021-09-26 NOTE — H&P (Signed)
History and Physical    Patient: Roberto Stanton IOE:[id number] DOB: Feb 24, 1995 DOA: 09/26/2021 DOS: the patient was seen and examined on 09/26/2021 PCP: Gildardo Pounds, PA  Patient coming from: Home  Chief Complaint:  Chief Complaint  Patient presents with   Muscle Pain   HPI: [name] is a 26 y.o. male with no significant past medical history except for recurrent nontraumatic rhabdomyolysis, diagnosed with McArdle's disease who presents to the ER for evaluation of severe cramping pain in his right leg as well as severe lower back pain which he rates a 10 x 10 in intensity at its worst.  He also complains of generalized muscle aches.  Symptoms started 1 day prior to his admission has progressively worsened.  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 He has had some nasal congestion and chills 1 day prior to his admission but denies having any fever.  He denies illicit drug use and no recent alcohol use. He was hospitalized in April for rhabdomyolysis. He rates his lower back pain a 10 x 10 in intensity and states that it is a constant pain with no relief with over-the-counter medications.  He denies any recent trauma, no lower extremity weakness, no urinary or fecal incontinence. He denies having any shortness of breath, no headache, no dizziness, no lightheadedness, no urinary symptoms, no changes in his bowel habits, no nausea, no vomiting, no blurred vision no focal deficit. His total CK is 49,793.  He has marked transaminitis as well. He will be admitted to the hospital for further evaluation.    Review of Systems: As mentioned in the history of present illness. All other systems reviewed and are negative. Past Medical History:  Diagnosis Date   Kidney stones    Pericarditis    Rhabdomyolysis    History reviewed. No pertinent surgical history. Social History:  reports that he has quit smoking. He has never used smokeless tobacco. He reports  current alcohol use. He reports that he does not currently use drugs after having used the following drugs: Marijuana.  Allergies  Allergen Reactions   Chlorhexidine Rash    History reviewed. No pertinent family history.  Prior to Admission medications   Medication Sig Start Date End Date Taking? Authorizing Provider  acetaminophen (TYLENOL) 325 MG tablet Take 2 tablets (650 mg total) by mouth every 6 (six) hours as needed for mild pain (or Fever >/= 101). 06/10/21   Dorcas Carrow, MD    Physical Exam: Vitals:   09/26/21 0933 09/26/21 0950 09/26/21 1000 09/26/21 1114  BP: 126/87  127/87 122/81  Pulse: 83  90 73  Resp: 19  18 18   Temp:  98 F (36.7 C)  98.3 F (36.8 C)  TempSrc:  Oral  Oral  SpO2: 95%  97% 98%  Weight:      Height:       Physical Exam Vitals and nursing note reviewed.  Constitutional:      Appearance: Normal appearance.  HENT:     Head: Normocephalic and atraumatic.     Nose: Nose normal.     Mouth/Throat:     Mouth: Mucous membranes are moist.  Eyes:     Conjunctiva/sclera: Conjunctivae normal.     Pupils: Pupils are equal, round, and reactive to light.  Cardiovascular:     Rate and Rhythm: Normal rate and regular rhythm.  Pulmonary:     Effort: Pulmonary effort is normal.     Breath sounds:  Normal breath sounds.  Abdominal:     General: Abdomen is flat. Bowel sounds are normal.     Palpations: Abdomen is soft.  Musculoskeletal:        General: Normal range of motion.     Cervical back: Normal range of motion and neck supple.  Skin:    General: Skin is warm and dry.  Neurological:     General: No focal deficit present.     Mental Status: He is alert.  Psychiatric:        Mood and Affect: Mood normal.        Behavior: Behavior normal.     Data Reviewed: Relevant notes from primary care and specialist visits, past discharge summaries as available in EHR, including Care Everywhere. Prior diagnostic testing as pertinent to current admission  diagnoses Updated medications and problem lists for reconciliation ED course, including vitals, labs, imaging, treatment and response to treatment Triage notes, nursing and pharmacy notes and ED provider's notes Notable results as noted in HPI Labs reviewed[place]  [place] [address], sodium 138, potassium 3.7, chloride 101, bicarb 27, glucose 96, BUN 13, creatinine 0.84, calcium 9.3, total protein 8.4, albumin 4.0, AST 484, ALT 230, alkaline phosphatase 80, total bilirubin 1.0, white count 6.2, hemoglobin 15.9, hematocrit 47.2, platelet count 241 There are no new results to review at this time.  Assessment and Plan: * Rhabdomyolysis Patient with a history of McArdle's disease and recurrent nontraumatic rhabdomyolysis who presents to the ER for evaluation of myalgias but worse in his lower back and right leg. Noted to have significantly elevated total CK levels over 49,000 Aggressive IV fluid hydration Symptom control Check daily CK level  Transaminitis Secondary to severe rhabdomyolysis Expect improvement in patient's liver enzymes following resolution of acute illness. Avoid hepatotoxic agents      Advance Care Planning:   Code Status: Full Code   Consults: None  Family Communication: Greater than 50% of time was spent discussing patient's condition and plan of care with him at the bedside.  All questions and concerns have been addressed.  He verbalizes understanding and agrees with the plan.  Severity of Illness: The appropriate patient status for this patient is INPATIENT. Inpatient status is judged to be reasonable and necessary in order to provide the required intensity of service to ensure the patient's safety. The patient's presenting symptoms, physical exam findings, and initial radiographic and laboratory data in the context of their chronic comorbidities is felt to place them at high risk for further clinical deterioration. Furthermore, it is not anticipated that the patient will be  medically stable for discharge from the hospital within 2 midnights of admission.   * I certify that at the point of admission it is my clinical judgment that the patient will require inpatient hospital care spanning beyond 2 midnights from the point of admission due to high intensity of service, high risk for further deterioration and high frequency of surveillance required.*  Author: Lucile Shutters, MD 09/26/2021 11:31 AM  For on call review [email or web address].

## 2021-09-26 NOTE — Assessment & Plan Note (Signed)
Secondary to severe rhabdomyolysis Expect improvement in patient's liver enzymes following resolution of acute illness.  Serial hepatic panel  Avoid hepatotoxic agents

## 2021-09-26 NOTE — Plan of Care (Signed)

## 2021-09-27 LAB — CK
Total CK: 22921 U/L — ABNORMAL HIGH (ref 49–397)
Total CK: 23316 U/L — ABNORMAL HIGH (ref 49–397)

## 2021-09-27 LAB — BASIC METABOLIC PANEL
Anion gap: 4 — ABNORMAL LOW (ref 5–15)
BUN: 8 mg/dL (ref 6–20)
CO2: 30 mmol/L (ref 22–32)
Calcium: 8.7 mg/dL — ABNORMAL LOW (ref 8.9–10.3)
Chloride: 105 mmol/L (ref 98–111)
Creatinine, Ser: 0.76 mg/dL (ref 0.61–1.24)
GFR, Estimated: >60 mL/min (ref >60)
Glucose, Bld: 92 mg/dL (ref 70–99)
Potassium: 3.9 mmol/L (ref 3.5–5.1)
Sodium: 139 mmol/L (ref 135–145)

## 2021-09-27 LAB — HEPATIC FUNCTION PANEL
ALT: 175 U/L — ABNORMAL HIGH (ref 0–44)
ALT: 170 U/L — ABNORMAL HIGH (ref 0–44)
AST: 273 U/L — ABNORMAL HIGH (ref 15–41)
AST: 242 U/L — ABNORMAL HIGH (ref 15–41)
Albumin: 3.4 g/dL — ABNORMAL LOW (ref 3.5–5.0)
Albumin: 3.6 g/dL (ref 3.5–5.0)
Alkaline Phosphatase: 67 U/L (ref 38–126)
Alkaline Phosphatase: 70 U/L (ref 38–126)
Bilirubin, Direct: <0.1 mg/dL (ref 0.0–0.2)
Bilirubin, Direct: <0.1 mg/dL (ref 0.0–0.2)
Total Bilirubin: 0.8 mg/dL (ref 0.3–1.2)
Total Bilirubin: 0.7 mg/dL (ref 0.3–1.2)
Total Protein: 6.9 g/dL (ref 6.5–8.1)
Total Protein: 7.2 g/dL (ref 6.5–8.1)

## 2021-09-27 LAB — CBC
HCT: 41.9 % (ref 39.0–52.0)
Hemoglobin: 14.2 g/dL (ref 13.0–17.0)
MCH: 32.1 pg (ref 26.0–34.0)
MCHC: 33.9 g/dL (ref 30.0–36.0)
MCV: 94.8 fL (ref 80.0–100.0)
Platelets: 194 10*3/uL (ref 150–400)
RBC: 4.42 MIL/uL (ref 4.22–5.81)
RDW: 12.9 % (ref 11.5–15.5)
WBC: 6.1 10*3/uL (ref 4.0–10.5)
nRBC: 0 % (ref 0.0–0.2)

## 2021-09-27 LAB — CREATININE, SERUM
Creatinine, Ser: 0.73 mg/dL (ref 0.61–1.24)
GFR, Estimated: >60 mL/min (ref >60)

## 2021-09-27 NOTE — Progress Notes (Signed)
Patient said the flexeril makes him dizzy. Order received from Dr Lyn Hollingshead to discontinue flexeril. Order also received to d/c telemetry

## 2021-09-27 NOTE — Progress Notes (Signed)
PROGRESS NOTE    Roberto Stanton   KTG:[id number] DOB: 1995/12/05  DOA: 09/26/2021 Date of Service: 09/27/21 PCP: Gildardo Pounds, PA     Brief Narrative / Hospital Course:  Roberto Stanton is a 26 y.o. male with no significant past medical history except for recurrent nontraumatic rhabdomyolysis, diagnosed with McArdle's disease who presents to the ER 09/26/2021 for evaluation of severe cramping pain in his right leg as well as severe lower back pain which he rates a 10 x 10 in intensity at its worst.  He also complains of generalized muscle aches.  Symptoms started 1 day prior to his admission has progressively worsened. 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  Of note, hospitalized April 2023 for rhabdomyolysis. 08/08: CK 49,000s marked transaminitis, admitted to hospitalist service. (+)rhinovirus on Resp PCR. [place] neg R DVT 08/09: CK down to 22,000s, Cr stable, LFT trending down.  Patient reports back pain is improved, we did discuss possible discharge home today with close outpatient follow-up but check labs in p.m. to ensure still trending down, CK up slightly, advised to stay another night at least on IV fluids.   Consultants:  none  Procedures: none    Subjective: Patient reports none     ASSESSMENT & PLAN:   Principal Problem:   Rhabdomyolysis Active Problems:   Transaminitis   Rhabdomyolysis Patient with a history of McArdle's disease and recurrent nontraumatic rhabdomyolysis who presents to the ER for evaluation of myalgias but worse in his lower back and right leg. Noted to have significantly elevated total CK levels over 49,000 on admission Aggressive IV fluid hydration Symptom control Check daily CK level  Transaminitis Secondary to severe rhabdomyolysis Expect improvement in patient's liver enzymes following resolution of acute illness. Serial hepatic panel Avoid hepatotoxic agents    DVT prophylaxis: Lovenox Code  Status: full Family Communication: none at this time Disposition Plan / TOC needs: inpatient, no TOC needs at this time Barriers to discharge / significant pending items: CK still high / trended up, liver fxn only slightly improved, staying on IV fluids              Objective: Vitals:   09/26/21 1522 09/26/21 1954 09/27/21 0800 09/27/21 1540  BP: 117/61 133/88 119/81 123/82  Pulse: 80 90 75 75  Resp: 18 16 16 12   Temp: 98.2 F (36.8 C) 98.7 F (37.1 C) 98 F (36.7 C) 98 F (36.7 C)  TempSrc: Oral  Oral Oral  SpO2: 99% 99% 100% 100%  Weight:      Height:        Intake/Output Summary (Last 24 hours) at 09/27/2021 1647 Last data filed at 09/27/2021 0426 Gross per 24 hour  Intake --  Output 900 ml  Net -900 ml   Filed Weights   09/26/21 0700  Weight: 68 kg    Examination:  Constitutional:  VS as above General Appearance: alert, well-developed, well-nourished, NAD Eyes: Normal lids and conjunctive, non-icteric sclera Ears, Nose, Mouth, Throat: Normal external appearance MMM Neck: No masses, trachea midline Respiratory: Normal respiratory effort No wheeze No rhonchi No rales Cardiovascular: S1/S2 normal No no murmur No rub/gallop auscultated No lower extremity edema Gastrointestinal: No tenderness No masses No hernia appreciated Musculoskeletal:  No clubbing/cyanosis of digits Symmetrical movement in all extremities Neurological: No cranial nerve deficit on limited exam Alert Psychiatric: Normal judgment/insight Normal mood and affect       Scheduled Medications:   enoxaparin (LOVENOX) injection  40 mg Subcutaneous Q24H   sodium chloride flush  3 mL Intravenous Q12H    Continuous Infusions:  sodium chloride 200 mL/hr at 09/27/21 1323   sodium chloride      PRN Medications:  sodium chloride, acetaminophen **OR** acetaminophen, ondansetron **OR** ondansetron (ZOFRAN) IV, oxyCODONE, sodium chloride flush  Antimicrobials:   Anti-infectives (From admission, onward)    None       Data Reviewed: I have personally reviewed following labs and imaging studies  CBC: Recent Labs  Lab 09/26/21 0710 09/27/21 0507  WBC 6.2 6.1  HGB 15.9 14.2  HCT 47.2 41.9  MCV 93.8 94.8  PLT 241 194   Basic Metabolic Panel: Recent Labs  Lab 09/26/21 0710 09/27/21 0507 09/27/21 1508  NA 138 139  --   K 3.7 3.9  --   CL 101 105  --   CO2 27 30  --   GLUCOSE 96 92  --   BUN 13 8  --   CREATININE 0.84 0.76 0.73  CALCIUM 9.3 8.7*  --    GFR: Estimated Creatinine Clearance: 133.4 mL/min (by C-G formula based on SCr of 0.73 mg/dL). Liver Function Tests: Recent Labs  Lab 09/26/21 0710 09/27/21 0507 09/27/21 1508  AST 484* 273* 242*  ALT 230* 175* 170*  ALKPHOS 80 67 70  BILITOT 1.0 0.8 0.7  PROT 8.4* 6.9 7.2  ALBUMIN 4.0 3.4* 3.6   No results for input(s): "LIPASE", "AMYLASE" in the last 168 hours. No results for input(s): "AMMONIA" in the last 168 hours. Coagulation Profile: No results for input(s): "INR", "PROTIME" in the last 168 hours. Cardiac Enzymes: Recent Labs  Lab 09/26/21 0710 09/27/21 0507 09/27/21 1508  CKTOTAL 26,712* 22,921* 23,316*   BNP (last 3 results) No results for input(s): "PROBNP" in the last 8760 hours. HbA1C: No results for input(s): "HGBA1C" in the last 72 hours. CBG: No results for input(s): "GLUCAP" in the last 168 hours. Lipid Profile: No results for input(s): "CHOL", "HDL", "LDLCALC", "TRIG", "CHOLHDL", "LDLDIRECT" in the last 72 hours. Thyroid Function Tests: No results for input(s): "TSH", "T4TOTAL", "FREET4", "T3FREE", "THYROIDAB" in the last 72 hours. Anemia Panel: No results for input(s): "VITAMINB12", "FOLATE", "FERRITIN", "TIBC", "IRON", "RETICCTPCT" in the last 72 hours. Urine analysis:    Component Value Date/Time   COLORURINE YELLOW (A) 09/26/2021 0746   APPEARANCEUR CLEAR (A) 09/26/2021 0746   LABSPEC 1.021 09/26/2021 0746   PHURINE 6.0 09/26/2021  0746   GLUCOSEU NEGATIVE 09/26/2021 0746   HGBUR NEGATIVE 09/26/2021 0746   BILIRUBINUR NEGATIVE 09/26/2021 0746   KETONESUR NEGATIVE 09/26/2021 0746   PROTEINUR NEGATIVE 09/26/2021 0746   NITRITE NEGATIVE 09/26/2021 0746   LEUKOCYTESUR NEGATIVE 09/26/2021 0746   Sepsis Labs: @LABRCNTIP (procalcitonin:4,lacticidven:4)  Recent Results (from the past 240 hour(s))  Respiratory (~20 pathogens) panel by PCR     Status: Abnormal   Collection Time: 09/26/21 11:43 AM   Specimen: Nasopharyngeal Swab; Respiratory  Result Value Ref Range Status   Adenovirus NOT DETECTED NOT DETECTED Final   Coronavirus 229E NOT DETECTED NOT DETECTED Final    Comment: (NOTE) The Coronavirus on the Respiratory Panel, DOES NOT test for the novel  Coronavirus (2019 nCoV)    Coronavirus HKU1 NOT DETECTED NOT DETECTED Final   Coronavirus NL63 NOT DETECTED NOT DETECTED Final   Coronavirus OC43 NOT DETECTED NOT DETECTED Final   Metapneumovirus NOT DETECTED NOT DETECTED Final   Rhinovirus / Enterovirus DETECTED (A) NOT DETECTED Final   Influenza A NOT DETECTED NOT DETECTED Final  Influenza B NOT DETECTED NOT DETECTED Final   Parainfluenza Virus 1 NOT DETECTED NOT DETECTED Final   Parainfluenza Virus 2 NOT DETECTED NOT DETECTED Final   Parainfluenza Virus 3 NOT DETECTED NOT DETECTED Final   Parainfluenza Virus 4 NOT DETECTED NOT DETECTED Final   Respiratory Syncytial Virus NOT DETECTED NOT DETECTED Final   Bordetella pertussis NOT DETECTED NOT DETECTED Final   Bordetella Parapertussis NOT DETECTED NOT DETECTED Final   Chlamydophila pneumoniae NOT DETECTED NOT DETECTED Final   Mycoplasma pneumoniae NOT DETECTED NOT DETECTED Final    Comment: Performed at [facility], [address] [place], [place] [address]         Radiology Studies: US Venous Img Lower Unilateral Right (DVT)  Result Date: 09/26/2021 CLINICAL DATA:  Right leg pain and swelling EXAM: RIGHT LOWER EXTREMITY VENOUS DOPPLER ULTRASOUND  TECHNIQUE: Gray-scale sonography with graded compression, as well as color Doppler and duplex ultrasound were performed to evaluate the lower extremity deep venous systems from the level of the common femoral vein and including the common femoral, femoral, profunda femoral, popliteal and calf veins including the posterior tibial, peroneal and gastrocnemius veins when visible. The superficial great saphenous vein was also interrogated. Spectral Doppler was utilized to evaluate flow at rest and with distal augmentation maneuvers in the common femoral, femoral and popliteal veins. COMPARISON:  None Available. FINDINGS: Contralateral Common Femoral Vein: Respiratory phasicity is normal and symmetric with the symptomatic side. No evidence of thrombus. Normal compressibility. Common Femoral Vein: No evidence of thrombus. Normal compressibility, respiratory phasicity and response to augmentation. Saphenofemoral Junction: No evidence of thrombus. Normal compressibility and flow on color Doppler imaging. Profunda Femoral Vein: No evidence of thrombus. Normal compressibility and flow on color Doppler imaging. Femoral Vein: No evidence of thrombus. Normal compressibility, respiratory phasicity and response to augmentation. Popliteal Vein: No evidence of thrombus. Normal compressibility, respiratory phasicity and response to augmentation. Calf Veins: No evidence of thrombus. Normal compressibility and flow on color Doppler imaging. Superficial Great Saphenous Vein: No evidence of thrombus. Normal compressibility. Venous Reflux:  None. Other Findings:  None. IMPRESSION: No evidence of right lower extremity deep venous thrombosis. Electronically Signed   By: Duanne Guess D.O.   On: 09/26/2021 14:01            LOS: 1 day      Sunnie Nielsen, DO [facility] 09/27/2021, 4:47 PM   Staff may message me via secure chat in Epic  but this may not receive immediate response,  please page for urgent  matters!  If 7PM-7AM, please contact night-coverage [email or web address]  Dictation software was used to generate the above note. Typos may occur and escape review, as with typed/written notes. Please contact Dr Lyn Hollingshead directly for clarity if needed.

## 2021-09-27 NOTE — Hospital Course (Addendum)
Roberto Stanton is a 26 y.o. male with no significant past medical history except for recurrent nontraumatic rhabdomyolysis, diagnosed with McArdle's disease who presents to the ER 09/26/2021 for evaluation of severe cramping pain in his right leg as well as severe lower back pain which he rates a 10 x 10 in intensity at its worst.  He also complains of generalized muscle aches.  Symptoms started 1 day prior to his admission has progressively worsened. 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  Of note, hospitalized April 2023 for rhabdomyolysis. 08/08: CK 49,000s marked transaminitis, admitted to hospitalist service. (+)rhinovirus on Resp PCR. [place] neg R DVT 08/09: CK down to 22,000s, Cr stable, LFT trending down.  Patient reports back pain is improved, we did discuss possible discharge home today with close outpatient follow-up but check labs in p.m. to ensure still trending down, CK up slightly, advised to stay another night at least on IV fluids. 08/10: CK and liver enzymes were not in normal range but were trending downward appropriately. Pt agrees to close outpatient follow-up - NO WORK or strenuous activity and drink plenty of fluids until cleared by PCP, return to ED for severe pain/weakness and dark urine.

## 2021-09-28 ENCOUNTER — Encounter: Payer: Self-pay | Admitting: Internal Medicine

## 2021-09-28 DIAGNOSIS — E7404 McArdle disease: Secondary | ICD-10-CM

## 2021-09-28 LAB — HEPATIC FUNCTION PANEL
ALT: 157 U/L — ABNORMAL HIGH (ref 0–44)
AST: 190 U/L — ABNORMAL HIGH (ref 15–41)
Albumin: 3.5 g/dL (ref 3.5–5.0)
Alkaline Phosphatase: 67 U/L (ref 38–126)
Bilirubin, Direct: <0.1 mg/dL (ref 0.0–0.2)
Total Bilirubin: 0.7 mg/dL (ref 0.3–1.2)
Total Protein: 7.2 g/dL (ref 6.5–8.1)

## 2021-09-28 LAB — CK: Total CK: 14414 U/L — ABNORMAL HIGH (ref 49–397)

## 2021-09-28 LAB — CREATININE, SERUM
Creatinine, Ser: 0.72 mg/dL (ref 0.61–1.24)
GFR, Estimated: >60 mL/min (ref >60)

## 2021-09-28 MED ORDER — OXYCODONE HCL 10 MG PO TABS: 5.0000 mg | ORAL_TABLET | Freq: Four times a day (QID) | ORAL | 0 refills | Status: DC | PRN

## 2021-09-28 NOTE — TOC Transition Note (Signed)
Transition of Care [facility]) - CM/SW Discharge Note   Patient Details  Name: Roberto Stanton MRN: [id number] Date of Birth: [birth year]  [facility] CM/SW Contact:  Chapman Fitch, RN Phone Number: 09/28/2021, 11:11 AM   Clinical Narrative:      Provided patient with goodrx coupon for oxycodone.   $9.93       Patient Goals and CMS Choice        Discharge Placement                       Discharge Plan and Services                                     Social Determinants of Health (SDOH) Interventions     Readmission Risk Interventions     No data to display

## 2021-09-28 NOTE — Progress Notes (Signed)
Patient discharged to home with all belongings. Patient and RN reviewed home medications and home instructions. Patient expressed understanding. PIVX1 removed with no complications.

## 2021-09-28 NOTE — Assessment & Plan Note (Signed)
   Discussed this gives higher risk of rhabdomyolysis  Follow outpatient

## 2021-09-28 NOTE — Discharge Summary (Signed)
Physician Discharge Summary   Patient: Roberto Stanton MRN: [id number]  DOB: [birth year]   Admit:     Date of Admission: 09/26/2021 Admitted from: home   Discharge: Date of discharge: 09/28/21 Disposition: Home Condition at discharge: good  CODE STATUS: FULL     Discharge Physician: Sunnie Nielsen, DO [facility]     PCP: Gildardo Pounds, PA  Recommendations for Outpatient Follow-up:  Follow up with PCP Gildardo Pounds, PA in <1 week (soonest we could get appt was 10/04/2021)  Please obtain labs/tests: CMP, CK  Please follow up on the following pending results: none PCP AND OTHER OUTPATIENT PROVIDERS: SEE BELOW FOR SPECIFIC DISCHARGE INSTRUCTIONS PRINTED FOR PATIENT IN ADDITION TO GENERIC AVS PATIENT INFO    Discharge Instructions     Diet - low sodium heart healthy   Complete by: As directed    Discharge instructions   Complete by: As directed    CONFIRM APPOINTMENT WITH YOUR PCP  TO RECHECK LABS EARLY NEXT WEEK (CBC, CMP, CK) PRIOR TO CLEARING YOU TO GO BACK TO WORK. DRINK PLENTY OF FLUIDS - AT LEAST 1.5-2 LITERS PER DAY FOR THE NEXT FEW DAYS. DO NOT TAKE OVER THE COUNTER MEDICATIONS OR SUPPLEMENTS UNTIL CLEARED BY YOUR PCP, NO NOT DRINK ALCOHOL UNTIL CLEARED BY YOUR PCP.   Increase activity slowly   Complete by: As directed           Hospital Course: Roberto Stanton is a 26 y.o. male with no significant past medical history except for recurrent nontraumatic rhabdomyolysis, diagnosed with McArdle's disease who presents to the ER 09/26/2021 for evaluation of severe cramping pain in his right leg as well as severe lower back pain which he rates a 10 x 10 in intensity at its worst.  He also complains of generalized muscle aches.  Symptoms started 1 day prior to his admission has progressively worsened. 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  Of note, hospitalized April 2023 for rhabdomyolysis. 08/08: CK 49,000s marked  transaminitis, admitted to hospitalist service. (+)rhinovirus on Resp PCR. [place] neg R DVT 08/09: CK down to 22,000s, Cr stable, LFT trending down.  Patient reports back pain is improved, we did discuss possible discharge home today with close outpatient follow-up but check labs in p.m. to ensure still trending down, CK up slightly, advised to stay another night at least on IV fluids. 08/10: CK and liver enzymes were not in normal range but were trending downward appropriately. Pt agrees to close outpatient follow-up - NO WORK or strenuous activity and drink plenty of fluids until cleared by PCP, return to ED for severe pain/weakness and dark urine.    Consultants:  none  Procedures:  none      Discharge Diagnoses: Principal Problem:   Rhabdomyolysis Active Problems:   Transaminitis   McArdle disease (HCC)    Assessment & Plan:  Rhabdomyolysis Patient with a history of McArdle's disease and recurrent nontraumatic rhabdomyolysis who presents to the ER for evaluation of myalgias but worse in his lower back and right leg. Noted to have significantly elevated total CK levels over 49,000 on admission Aggressive IV fluid hydration Symptom control Check daily CK level  Transaminitis Secondary to severe rhabdomyolysis Expect improvement in patient's liver enzymes following resolution of acute illness. Serial hepatic panel Avoid hepatotoxic agents  McArdle disease [facility]) Discussed this gives higher risk of rhabdomyolysis Follow outpatient       Discharge Instructions  Allergies as of 09/28/2021       Reactions   Chlorhexidine Rash        Medication List     STOP taking these medications    acetaminophen 325 MG tablet Commonly known as: TYLENOL       TAKE these medications    Oxycodone HCl 10 MG Tabs Take 0.5-1 tablets (5-10 mg total) by mouth every 6 (six) hours as needed for severe pain or moderate pain.         Follow-up Information     Rutherford Limerick, PA Follow up on 10/04/2021.   Specialty: Physician Assistant Why: appointment @ 10:40 w/ [facility] information: [place] [address] [phone]         Kate Sable, MD Follow up on 09/29/2021.   Specialties: Cardiology, Radiology Why: appointment @ 2:25 w/ [facility] Contact information: [place] [address] [phone]                 Allergies  Allergen Reactions   Chlorhexidine Rash     Subjective: pt reports back pain is about the same but overall better from admission. No CP/SOB, urine clear.    Discharge Exam: BP 122/79 (BP Location: Left Arm)   Pulse 64   Temp 97.8 F (36.6 C) (Oral)   Resp 18   Ht 5\' 9"  (1.753 m)   Wt 68 kg   SpO2 100%   BMI 22.15 kg/m  General: Pt is alert, awake, not in acute distress Cardiovascular: RRR, S1/S2 +, no rubs, no gallops Respiratory: CTA bilaterally, no wheezing, no rhonchi Abdominal: Soft, NT, ND, bowel sounds + Extremities: no edema, no cyanosis     The results of significant diagnostics from this hospitalization (including imaging, microbiology, ancillary and laboratory) are listed below for reference.     Microbiology: Recent Results (from the past 240 hour(s))  Respiratory (~20 pathogens) panel by PCR     Status: Abnormal   Collection Time: 09/26/21 11:43 AM   Specimen: Nasopharyngeal Swab; Respiratory  Result Value Ref Range Status   Adenovirus NOT DETECTED NOT DETECTED Final   Coronavirus 229E NOT DETECTED NOT DETECTED Final    Comment: (NOTE) The Coronavirus on the Respiratory Panel, DOES NOT test for the novel  Coronavirus (2019 nCoV)    Coronavirus HKU1 NOT DETECTED NOT DETECTED Final   Coronavirus NL63 NOT DETECTED NOT DETECTED Final   Coronavirus OC43 NOT DETECTED NOT DETECTED Final   Metapneumovirus NOT DETECTED NOT DETECTED Final   Rhinovirus / Enterovirus DETECTED (A) NOT DETECTED Final   Influenza A NOT DETECTED  NOT DETECTED Final   Influenza B NOT DETECTED NOT DETECTED Final   Parainfluenza Virus 1 NOT DETECTED NOT DETECTED Final   Parainfluenza Virus 2 NOT DETECTED NOT DETECTED Final   Parainfluenza Virus 3 NOT DETECTED NOT DETECTED Final   Parainfluenza Virus 4 NOT DETECTED NOT DETECTED Final   Respiratory Syncytial Virus NOT DETECTED NOT DETECTED Final   Bordetella pertussis NOT DETECTED NOT DETECTED Final   Bordetella Parapertussis NOT DETECTED NOT DETECTED Final   Chlamydophila pneumoniae NOT DETECTED NOT DETECTED Final   Mycoplasma pneumoniae NOT DETECTED NOT DETECTED Final    Comment: Performed at [facility], [address], [place], [place] [address]     Labs: BNP (last 3 results) No results for input(s): "BNP" in the last 8760 hours. Basic Metabolic Panel: Recent Labs  Lab 09/26/21 0710 09/27/21 0507 09/27/21 1508 09/28/21  0459  NA 138 139  --   --   K 3.7 3.9  --   --   CL 101 105  --   --   CO2 27 30  --   --   GLUCOSE 96 92  --   --   BUN 13 8  --   --   CREATININE 0.84 0.76 0.73 0.72  CALCIUM 9.3 8.7*  --   --    Liver Function Tests: Recent Labs  Lab 09/26/21 0710 09/27/21 0507 09/27/21 1508 09/28/21 0459  AST 484* 273* 242* 190*  ALT 230* 175* 170* 157*  ALKPHOS 80 67 70 67  BILITOT 1.0 0.8 0.7 0.7  PROT 8.4* 6.9 7.2 7.2  ALBUMIN 4.0 3.4* 3.6 3.5   No results for input(s): "LIPASE", "AMYLASE" in the last 168 hours. No results for input(s): "AMMONIA" in the last 168 hours. CBC: Recent Labs  Lab 09/26/21 0710 09/27/21 0507  WBC 6.2 6.1  HGB 15.9 14.2  HCT 47.2 41.9  MCV 93.8 94.8  PLT 241 194   Cardiac Enzymes: Recent Labs  Lab 09/26/21 0710 09/27/21 0507 09/27/21 1508 09/28/21 0459  CKTOTAL 49,793* 22,921* 23,316* 14,414*   BNP: Invalid input(s): "POCBNP" CBG: No results for input(s): "GLUCAP" in the last 168 hours. D-Dimer No results for input(s): "DDIMER" in the last 72 hours. Hgb A1c No results for input(s): "HGBA1C" in the  last 72 hours. Lipid Profile No results for input(s): "CHOL", "HDL", "LDLCALC", "TRIG", "CHOLHDL", "LDLDIRECT" in the last 72 hours. Thyroid function studies No results for input(s): "TSH", "T4TOTAL", "T3FREE", "THYROIDAB" in the last 72 hours.  Invalid input(s): "FREET3" Anemia work up No results for input(s): "VITAMINB12", "FOLATE", "FERRITIN", "TIBC", "IRON", "RETICCTPCT" in the last 72 hours. Urinalysis    Component Value Date/Time   COLORURINE YELLOW (A) 09/26/2021 0746   APPEARANCEUR CLEAR (A) 09/26/2021 0746   LABSPEC 1.021 09/26/2021 0746   PHURINE 6.0 09/26/2021 0746   GLUCOSEU NEGATIVE 09/26/2021 0746   HGBUR NEGATIVE 09/26/2021 0746   BILIRUBINUR NEGATIVE 09/26/2021 0746   KETONESUR NEGATIVE 09/26/2021 0746   PROTEINUR NEGATIVE 09/26/2021 0746   NITRITE NEGATIVE 09/26/2021 0746   LEUKOCYTESUR NEGATIVE 09/26/2021 0746   Sepsis Labs Recent Labs  Lab 09/26/21 0710 09/27/21 0507  WBC 6.2 6.1   Microbiology Recent Results (from the past 240 hour(s))  Respiratory (~20 pathogens) panel by PCR     Status: Abnormal   Collection Time: 09/26/21 11:43 AM   Specimen: Nasopharyngeal Swab; Respiratory  Result Value Ref Range Status   Adenovirus NOT DETECTED NOT DETECTED Final   Coronavirus 229E NOT DETECTED NOT DETECTED Final    Comment: (NOTE) The Coronavirus on the Respiratory Panel, DOES NOT test for the novel  Coronavirus (2019 nCoV)    Coronavirus HKU1 NOT DETECTED NOT DETECTED Final   Coronavirus NL63 NOT DETECTED NOT DETECTED Final   Coronavirus OC43 NOT DETECTED NOT DETECTED Final   Metapneumovirus NOT DETECTED NOT DETECTED Final   Rhinovirus / Enterovirus DETECTED (A) NOT DETECTED Final   Influenza A NOT DETECTED NOT DETECTED Final   Influenza B NOT DETECTED NOT DETECTED Final   Parainfluenza Virus 1 NOT DETECTED NOT DETECTED Final   Parainfluenza Virus 2 NOT DETECTED NOT DETECTED Final   Parainfluenza Virus 3 NOT DETECTED NOT DETECTED Final   Parainfluenza  Virus 4 NOT DETECTED NOT DETECTED Final   Respiratory Syncytial Virus NOT DETECTED NOT DETECTED Final   Bordetella pertussis NOT DETECTED NOT DETECTED Final   Bordetella Parapertussis NOT  DETECTED NOT DETECTED Final   Chlamydophila pneumoniae NOT DETECTED NOT DETECTED Final   Mycoplasma pneumoniae NOT DETECTED NOT DETECTED Final    Comment: Performed at [facility], [place] [address], [place], [place] [address]   Imaging US Venous Img Lower Unilateral Right (DVT)  Result Date: 09/26/2021 CLINICAL DATA:  Right leg pain and swelling EXAM: RIGHT LOWER EXTREMITY VENOUS DOPPLER ULTRASOUND TECHNIQUE: Gray-scale sonography with graded compression, as well as color Doppler and duplex ultrasound were performed to evaluate the lower extremity deep venous systems from the level of the common femoral vein and including the common femoral, femoral, profunda femoral, popliteal and calf veins including the posterior tibial, peroneal and gastrocnemius veins when visible. The superficial great saphenous vein was also interrogated. Spectral Doppler was utilized to evaluate flow at rest and with distal augmentation maneuvers in the common femoral, femoral and popliteal veins. COMPARISON:  None Available. FINDINGS: Contralateral Common Femoral Vein: Respiratory phasicity is normal and symmetric with the symptomatic side. No evidence of thrombus. Normal compressibility. Common Femoral Vein: No evidence of thrombus. Normal compressibility, respiratory phasicity and response to augmentation. Saphenofemoral Junction: No evidence of thrombus. Normal compressibility and flow on color Doppler imaging. Profunda Femoral Vein: No evidence of thrombus. Normal compressibility and flow on color Doppler imaging. Femoral Vein: No evidence of thrombus. Normal compressibility, respiratory phasicity and response to augmentation. Popliteal Vein: No evidence of thrombus. Normal compressibility, respiratory phasicity and response to  augmentation. Calf Veins: No evidence of thrombus. Normal compressibility and flow on color Doppler imaging. Superficial Great Saphenous Vein: No evidence of thrombus. Normal compressibility. Venous Reflux:  None. Other Findings:  None. IMPRESSION: No evidence of right lower extremity deep venous thrombosis. Electronically Signed   By: Davina Poke D.O.   On: 09/26/2021 14:01      Time coordinating discharge: Over 30 minutes  SIGNED:  Emeterio Reeve DO [facility]

## 2021-09-28 NOTE — Plan of Care (Signed)

## 2021-09-28 NOTE — Plan of Care (Signed)
  Problem: Education: Goal: Knowledge of General Education information will improve Description: Including pain rating scale, medication(s)/side effects and non-pharmacologic comfort measures 09/28/2021 1021 by Ellis Parents, RN Outcome: Progressing 09/28/2021 1020 by Ellis Parents, RN Outcome: Progressing   Problem: Clinical Measurements: Goal: Ability to maintain clinical measurements within normal limits will improve 09/28/2021 1021 by Ellis Parents, RN Outcome: Progressing 09/28/2021 1020 by Ellis Parents, RN Outcome: Progressing Goal: Will remain free from infection 09/28/2021 1021 by Ellis Parents, RN Outcome: Progressing 09/28/2021 1020 by Ellis Parents, RN Outcome: Progressing Goal: Diagnostic test results will improve 09/28/2021 1021 by Ellis Parents, RN Outcome: Progressing 09/28/2021 1020 by Ellis Parents, RN Outcome: Progressing Goal: Respiratory complications will improve 09/28/2021 1021 by Ellis Parents, RN Outcome: Progressing 09/28/2021 1020 by Ellis Parents, RN Outcome: Progressing Goal: Cardiovascular complication will be avoided 09/28/2021 1021 by Ellis Parents, RN Outcome: Progressing 09/28/2021 1020 by Ellis Parents, RN Outcome: Progressing

## 2021-09-28 NOTE — Plan of Care (Signed)
  Problem: Education: Goal: Knowledge of General Education information will improve Description: Including pain rating scale, medication(s)/side effects and non-pharmacologic comfort measures 09/28/2021 1202 by Evelena Peat, RN Outcome: Completed/Met 09/28/2021 1021 by Evelena Peat, RN Outcome: Progressing 09/28/2021 1020 by Evelena Peat, RN Outcome: Progressing   Problem: Health Behavior/Discharge Planning: Goal: Ability to manage health-related needs will improve 09/28/2021 1202 by Evelena Peat, RN Outcome: Completed/Met 09/28/2021 1021 by Evelena Peat, RN Outcome: Progressing 09/28/2021 1020 by Evelena Peat, RN Outcome: Progressing   Problem: Clinical Measurements: Goal: Ability to maintain clinical measurements within normal limits will improve 09/28/2021 1202 by Evelena Peat, RN Outcome: Completed/Met 09/28/2021 1021 by Evelena Peat, RN Outcome: Progressing 09/28/2021 1020 by Evelena Peat, RN Outcome: Progressing Goal: Will remain free from infection 09/28/2021 1202 by Evelena Peat, RN Outcome: Completed/Met 09/28/2021 1021 by Evelena Peat, RN Outcome: Progressing 09/28/2021 1020 by Evelena Peat, RN Outcome: Progressing Goal: Diagnostic test results will improve 09/28/2021 1202 by Evelena Peat, RN Outcome: Completed/Met 09/28/2021 1021 by Evelena Peat, RN Outcome: Progressing 09/28/2021 1020 by Evelena Peat, RN Outcome: Progressing Goal: Respiratory complications will improve 09/28/2021 1202 by Evelena Peat, RN Outcome: Completed/Met 09/28/2021 1021 by Evelena Peat, RN Outcome: Progressing 09/28/2021 1020 by Evelena Peat, RN Outcome: Progressing Goal: Cardiovascular complication will be avoided 09/28/2021 1202 by Evelena Peat, RN Outcome: Completed/Met 09/28/2021 1021 by Evelena Peat, RN Outcome: Progressing 09/28/2021 1020 by Evelena Peat, RN Outcome:  Progressing

## 2021-09-29 ENCOUNTER — Ambulatory Visit: Payer: Medicaid Other | Admitting: Medical

## 2021-09-29 ENCOUNTER — Encounter: Payer: Self-pay | Admitting: Medical

## 2021-09-29 NOTE — Progress Notes (Deleted)
Cardiology Office Note:    Date:  09/29/2021   ID:  [name], DOB 1996-02-04, MRN [id number]  PCP:  Gildardo Pounds, PA  [facility] Cardiologist:  Debbe Odea, MD  [facility] Electrophysiologist:  None   Referring MD: Gildardo Pounds, PA   Chief Complaint: Hospital follow-up  History of Present Illness:    Roberto Stanton is a 26 y.o. male with a hx of kidney stones, recurrent nontraumatic rhabdomyolysis, McArdle's disease and pericarditis who presents for hospital follow-up.   He was seen in the ED 2021 for chest pain. Work-up was normal. Chest was tender on palpation. He also reported DOE. An echo was ordered.   He was recently admitted for rhabdomyolysis. He presented with severe lower back pain. CK was 49,000 with transaminitis and +rhinovirus. He was hydrated and discharged.   Today,   Past Medical History:  Diagnosis Date   Kidney stones    McArdle disease (HCC) 09/28/2021   Pericarditis    Rhabdomyolysis     No past surgical history on file.  Current Medications: No outpatient medications have been marked as taking for the 09/29/21 encounter (Appointment) with Fransico Michael, Shannie Kontos H, PA-C.     Allergies:   Chlorhexidine   Social History   Socioeconomic History   Marital status: Single    Spouse name: Not on file   Number of children: Not on file   Years of education: Not on file   Highest education level: Not on file  Occupational History   Not on file  Tobacco Use   Smoking status: Former   Smokeless tobacco: Never  Vaping Use   Vaping Use: Every day   Substances: Nicotine  Substance and Sexual Activity   Alcohol use: Yes    Comment: occasional   Drug use: Not Currently    Types: Marijuana   Sexual activity: Not on file  Other Topics Concern   Not on file  Social History Narrative   Not on file   Social Determinants of Health   Financial Resource Strain: Not on file  Food Insecurity: Not on file  Transportation Needs: Not  on file  Physical Activity: Not on file  Stress: Not on file  Social Connections: Not on file     Family History: The patient's family history is not on file.  ROS:   Please see the history of present illness.     All other systems reviewed and are negative.  EKGs/Labs/Other Studies Reviewed:    The following studies were reviewed today:  Echo 11/2019  1. Left ventricular ejection fraction, by estimation, is 60 to 65%. The  left ventricle has normal function. The left ventricle has no regional  wall motion abnormalities. Left ventricular diastolic parameters were  normal. The average left ventricular  global longitudinal strain is -19.1 %.   2. Right ventricular systolic function is normal. The right ventricular  size is normal.   3. Mild mitral valve regurgitation.   EKG:  EKG is *** ordered today.  The ekg ordered today demonstrates ***  Recent Labs: 09/27/2021: BUN 8; Hemoglobin 14.2; Platelets 194; Potassium 3.9; Sodium 139 09/28/2021: ALT 157; Creatinine, Ser 0.72  Recent Lipid Panel No results found for: "CHOL", "TRIG", "HDL", "CHOLHDL", "VLDL", "LDLCALC", "LDLDIRECT"   Risk Assessment/Calculations:   {Does this patient have ATRIAL FIBRILLATION?:[phone]}   Physical Exam:    VS:  There were no vitals taken for this visit.    Wt Readings from Last 3 Encounters:  09/26/21 150  lb (68 kg)  06/07/21 137 lb (62.1 kg)  11/23/20 135 lb (61.2 kg)     GEN: *** Well nourished, well developed in no acute distress HEENT: Normal NECK: No JVD; No carotid bruits LYMPHATICS: No lymphadenopathy CARDIAC: ***RRR, no murmurs, rubs, gallops RESPIRATORY:  Clear to auscultation without rales, wheezing or rhonchi  ABDOMEN: Soft, non-tender, non-distended MUSCULOSKELETAL:  No edema; No deformity  SKIN: Warm and dry NEUROLOGIC:  Alert and oriented x 3 PSYCHIATRIC:  Normal affect   ASSESSMENT:    No diagnosis found. PLAN:    In order of problems listed above:  Mcardle  disease  Disposition: Follow up {follow up:15908} with ***   Shared Decision Making/Informed Consent   {Are you ordering a CV Procedure (e.g. stress test, cath, DCCV, TEE, etc)?   Press F2        :[id number]}    Signed, Malike Foglio Ardelle Lesches  09/29/2021 10:42 AM    [facility]

## 2021-10-04 ENCOUNTER — Other Ambulatory Visit: Payer: Self-pay

## 2021-10-04 ENCOUNTER — Encounter: Payer: Self-pay | Admitting: Emergency Medicine

## 2021-10-04 ENCOUNTER — Inpatient Hospital Stay
Admission: EM | Admit: 2021-10-04 | Discharge: 2021-10-06 | DRG: 558 | Disposition: A | Discharge status: Home / Self Care | Payer: Medicaid Other | Attending: Obstetrics and Gynecology | Admitting: Obstetrics and Gynecology

## 2021-10-04 DIAGNOSIS — I4510 Unspecified right bundle-branch block: Secondary | ICD-10-CM | POA: Diagnosis present

## 2021-10-04 DIAGNOSIS — E7404 McArdle disease: Secondary | ICD-10-CM | POA: Diagnosis present

## 2021-10-04 DIAGNOSIS — K029 Dental caries, unspecified: Secondary | ICD-10-CM | POA: Diagnosis present

## 2021-10-04 DIAGNOSIS — J020 Streptococcal pharyngitis: Secondary | ICD-10-CM | POA: Diagnosis present

## 2021-10-04 DIAGNOSIS — Z87442 Personal history of urinary calculi: Secondary | ICD-10-CM

## 2021-10-04 DIAGNOSIS — Z20822 Contact with and (suspected) exposure to covid-19: Secondary | ICD-10-CM | POA: Diagnosis present

## 2021-10-04 DIAGNOSIS — R000 Tachycardia, unspecified: Secondary | ICD-10-CM | POA: Diagnosis present

## 2021-10-04 DIAGNOSIS — M6282 Rhabdomyolysis: Principal | ICD-10-CM | POA: Diagnosis present

## 2021-10-04 DIAGNOSIS — Z888 Allergy status to other drugs, medicaments and biological substances status: Secondary | ICD-10-CM

## 2021-10-04 DIAGNOSIS — Z87891 Personal history of nicotine dependence: Secondary | ICD-10-CM

## 2021-10-04 DIAGNOSIS — R509 Fever, unspecified: Secondary | ICD-10-CM

## 2021-10-04 DIAGNOSIS — R7401 Elevation of levels of liver transaminase levels: Secondary | ICD-10-CM | POA: Diagnosis present

## 2021-10-04 LAB — COMPREHENSIVE METABOLIC PANEL
ALT: 173 U/L — ABNORMAL HIGH (ref 0–44)
AST: 297 U/L — ABNORMAL HIGH (ref 15–41)
Albumin: 4.2 g/dL (ref 3.5–5.0)
Alkaline Phosphatase: 78 U/L (ref 38–126)
Anion gap: 8 (ref 5–15)
BUN: 16 mg/dL (ref 6–20)
CO2: 25 mmol/L (ref 22–32)
Calcium: 9.3 mg/dL (ref 8.9–10.3)
Chloride: 101 mmol/L (ref 98–111)
Creatinine, Ser: 0.83 mg/dL (ref 0.61–1.24)
GFR, Estimated: >60 mL/min (ref >60)
Glucose, Bld: 92 mg/dL (ref 70–99)
Potassium: 3.9 mmol/L (ref 3.5–5.1)
Sodium: 134 mmol/L — ABNORMAL LOW (ref 135–145)
Total Bilirubin: 0.7 mg/dL (ref 0.3–1.2)
Total Protein: 8.1 g/dL (ref 6.5–8.1)

## 2021-10-04 LAB — CBC WITH DIFFERENTIAL/PLATELET
Abs Immature Granulocytes: 0.04 10*3/uL (ref 0.00–0.07)
Basophils Absolute: 0.1 10*3/uL (ref 0.0–0.1)
Basophils Relative: 0 %
Eosinophils Absolute: 0.1 10*3/uL (ref 0.0–0.5)
Eosinophils Relative: 1 %
HCT: 47 % (ref 39.0–52.0)
Hemoglobin: 16.1 g/dL (ref 13.0–17.0)
Immature Granulocytes: 0 %
Lymphocytes Relative: 8 %
Lymphs Abs: 1.1 10*3/uL (ref 0.7–4.0)
MCH: 31.3 pg (ref 26.0–34.0)
MCHC: 34.3 g/dL (ref 30.0–36.0)
MCV: 91.4 fL (ref 80.0–100.0)
Monocytes Absolute: 0.9 10*3/uL (ref 0.1–1.0)
Monocytes Relative: 6 %
Neutro Abs: 12.1 10*3/uL — ABNORMAL HIGH (ref 1.7–7.7)
Neutrophils Relative %: 85 %
Platelets: 219 10*3/uL (ref 150–400)
RBC: 5.14 MIL/uL (ref 4.22–5.81)
RDW: 12.3 % (ref 11.5–15.5)
WBC: 14.3 10*3/uL — ABNORMAL HIGH (ref 4.0–10.5)
nRBC: 0 % (ref 0.0–0.2)

## 2021-10-04 LAB — URINALYSIS, ROUTINE W REFLEX MICROSCOPIC
Bacteria, UA: NONE SEEN
Bilirubin Urine: NEGATIVE
Glucose, UA: NEGATIVE mg/dL
Ketones, ur: NEGATIVE mg/dL
Leukocytes,Ua: NEGATIVE
Nitrite: NEGATIVE
Protein, ur: NEGATIVE mg/dL
Specific Gravity, Urine: 1.011 (ref 1.005–1.030)
pH: 6 (ref 5.0–8.0)

## 2021-10-04 LAB — MONONUCLEOSIS SCREEN: Mono Screen: NEGATIVE

## 2021-10-04 LAB — MAGNESIUM: Magnesium: 1.9 mg/dL (ref 1.7–2.4)

## 2021-10-04 LAB — CK: Total CK: 44343 U/L — ABNORMAL HIGH (ref 49–397)

## 2021-10-04 LAB — GROUP A STREP BY PCR: Group A Strep by PCR: DETECTED — AB

## 2021-10-04 LAB — SARS CORONAVIRUS 2 BY RT PCR: SARS Coronavirus 2 by RT PCR: NEGATIVE

## 2021-10-04 MED ORDER — ONDANSETRON HCL 4 MG PO TABS: 4.0000 mg | ORAL_TABLET | Freq: Four times a day (QID) | ORAL | Status: DC | PRN

## 2021-10-04 MED ORDER — LACTATED RINGERS IV BOLUS
2000.0000 mL | Freq: Once | INTRAVENOUS | Status: AC
  Administered 2021-10-04: 2000 mL via INTRAVENOUS

## 2021-10-04 MED ORDER — ONDANSETRON HCL 4 MG/2ML IJ SOLN
4.0000 mg | Freq: Once | INTRAMUSCULAR | Status: AC
Start: 2021-10-04 — End: 2021-10-04
  Administered 2021-10-04: 4 mg via INTRAVENOUS
  Filled 2021-10-04: qty 2

## 2021-10-04 MED ORDER — AMOXICILLIN 500 MG PO CAPS: 500.0000 mg | ORAL_CAPSULE | Freq: Three times a day (TID) | ORAL | Status: DC

## 2021-10-04 MED ORDER — AMOXICILLIN 250 MG/5ML PO SUSR
500.0000 mg | Freq: Two times a day (BID) | ORAL | Status: DC
  Administered 2021-10-04 – 2021-10-06 (×4): 500 mg via ORAL
  Filled 2021-10-04 (×5): qty 10

## 2021-10-04 MED ORDER — HYDROMORPHONE HCL 1 MG/ML IJ SOLN
1.0000 mg | Freq: Once | INTRAMUSCULAR | Status: AC
  Administered 2021-10-04: 1 mg via INTRAVENOUS
  Filled 2021-10-04: qty 1

## 2021-10-04 MED ORDER — LACTATED RINGERS IV BOLUS
1000.0000 mL | Freq: Once | INTRAVENOUS | Status: AC
  Administered 2021-10-04: 1000 mL via INTRAVENOUS

## 2021-10-04 MED ORDER — AMOXICILLIN 250 MG/5ML PO SUSR
500.0000 mg | Freq: Once | ORAL | Status: AC
  Administered 2021-10-04: 500 mg via ORAL
  Filled 2021-10-04: qty 10

## 2021-10-04 MED ORDER — SODIUM CHLORIDE 0.9 % IV SOLN: INTRAVENOUS | Status: DC

## 2021-10-04 MED ORDER — AMOXICILLIN 500 MG PO CAPS
500.0000 mg | ORAL_CAPSULE | Freq: Two times a day (BID) | ORAL | Status: DC
Start: 2021-10-04 — End: 2021-10-04

## 2021-10-04 MED ORDER — ACETAMINOPHEN 500 MG PO TABS
1000.0000 mg | ORAL_TABLET | Freq: Once | ORAL | Status: AC
  Administered 2021-10-04: 1000 mg via ORAL
  Filled 2021-10-04: qty 2

## 2021-10-04 MED ORDER — OXYCODONE HCL 5 MG PO TABS
5.0000 mg | ORAL_TABLET | Freq: Four times a day (QID) | ORAL | Status: DC | PRN
  Administered 2021-10-04 (×2): 10 mg via ORAL
  Filled 2021-10-04 (×3): qty 2

## 2021-10-04 MED ORDER — KETOROLAC TROMETHAMINE 30 MG/ML IJ SOLN
30.0000 mg | Freq: Once | INTRAMUSCULAR | Status: AC
  Administered 2021-10-04: 30 mg via INTRAVENOUS
  Filled 2021-10-04: qty 1

## 2021-10-04 MED ORDER — ENOXAPARIN SODIUM 40 MG/0.4ML IJ SOSY
40.0000 mg | PREFILLED_SYRINGE | INTRAMUSCULAR | Status: DC
  Administered 2021-10-04 – 2021-10-05 (×2): 40 mg via SUBCUTANEOUS
  Filled 2021-10-04 (×3): qty 0.4

## 2021-10-04 MED ORDER — OXYCODONE HCL 5 MG PO TABS
10.0000 mg | ORAL_TABLET | ORAL | Status: DC | PRN
  Administered 2021-10-04 – 2021-10-06 (×10): 10 mg via ORAL
  Filled 2021-10-04 (×9): qty 2

## 2021-10-04 MED ORDER — AMOXICILLIN 250 MG/5ML PO SUSR
500.0000 mg | Freq: Two times a day (BID) | ORAL | Status: DC
  Filled 2021-10-04: qty 10

## 2021-10-04 MED ORDER — ONDANSETRON HCL 4 MG/2ML IJ SOLN
4.0000 mg | Freq: Four times a day (QID) | INTRAMUSCULAR | Status: DC | PRN
  Administered 2021-10-04: 4 mg via INTRAVENOUS
  Filled 2021-10-04: qty 2

## 2021-10-04 MED ORDER — MORPHINE SULFATE (PF) 4 MG/ML IV SOLN
4.0000 mg | Freq: Once | INTRAVENOUS | Status: AC
  Administered 2021-10-04: 4 mg via INTRAVENOUS
  Filled 2021-10-04: qty 1

## 2021-10-04 MED ORDER — ACETAMINOPHEN 325 MG PO TABS
650.0000 mg | ORAL_TABLET | Freq: Four times a day (QID) | ORAL | Status: DC | PRN
  Administered 2021-10-04 – 2021-10-05 (×3): 650 mg via ORAL
  Filled 2021-10-04 (×3): qty 2

## 2021-10-04 MED ORDER — OXYCODONE HCL 5 MG PO TABS: 5.0000 mg | ORAL_TABLET | ORAL | Status: DC | PRN

## 2021-10-04 MED ORDER — ACETAMINOPHEN 650 MG RE SUPP: 650.0000 mg | Freq: Four times a day (QID) | RECTAL | Status: DC | PRN

## 2021-10-04 MED ORDER — LACTATED RINGERS IV SOLN
INTRAVENOUS | Status: DC
Start: 2021-10-04 — End: 2021-10-05

## 2021-10-04 NOTE — Assessment & Plan Note (Signed)
Secondary to known history of McArdle's disease resulting in recurrent nontraumatic rhabdomyolysis. Total CK is elevated at 43,000 Aggressive IV fluid hydration Symptom control Check daily CK levels

## 2021-10-04 NOTE — ED Provider Notes (Signed)
[facility] Provider Note    Event Date/Time   First MD Initiated Contact with Patient 10/04/21 [phone]     (approximate)   History   Fever   HPI  Roberto Stanton is a 26 y.o. male with history of McArdle disease who presents to the emergency department concerned that he is in rhabdomyolysis.  Was just discharged from the hospital last week for rhabdo.  States today he was outside in the heat and did a little bit more in the yard than he should have.  States he started having fevers tonight of 101, sore throat, body aches, chills.  No cough, chest pain, shortness of breath, abdominal pain, vomiting, diarrhea.  Patient does report that he has a bad tooth in the right lower mouth that he thinks could also be infected.  No facial swelling, difficulty swallowing or speaking.   History provided by patient.    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 No pertinent surgical history.  MEDICATIONS:  Prior to Admission medications   Medication Sig Start Date End Date Taking? Authorizing Provider  oxyCODONE 10 MG TABS Take 0.5-1 tablets (5-10 mg total) by mouth every 6 (six) hours as needed for severe pain or moderate pain. 09/28/21   Sunnie Nielsen, DO    Physical Exam   Triage Vital Signs: ED Triage Vitals  Enc Vitals Group     BP 10/04/21 0338 (!) 145/88     Pulse Rate 10/04/21 0326 (!) 128     Resp 10/04/21 0326 18     Temp 10/04/21 0326 99.8 F (37.7 C)     Temp Source 10/04/21 0326 Oral     SpO2 10/04/21 0326 97 %     Weight 10/04/21 0327 147 lb (66.7 kg)     Height 10/04/21 0327 5\' 9"  (1.753 m)     Head Circumference --      Peak Flow --      Pain Score 10/04/21 0327 8     Pain Loc --      Pain Edu? --      Excl. in GC? --     Most recent vital signs: Vitals:   10/04/21 0338 10/04/21 0524  BP: (!) 145/88 133/83  Pulse:  (!) 106   Resp:  17  Temp:  98.4 F (36.9 C)  SpO2:  96%    CONSTITUTIONAL: Alert and oriented and responds appropriately to questions. Well-appearing; well-nourished HEAD: Normocephalic, atraumatic EYES: Conjunctivae clear, pupils appear equal, sclera nonicteric ENT: normal nose; moist mucous membranes; has symmetric bilateral tonsillar hypertrophy with exudate, no uvular deviation, no unilateral swelling in posterior oropharynx, no trismus or drooling, no muffled voice, normal phonation, no stridor, airway patent.  Dental caries present with tenderness over the right lower teeth, no drainable dental abscess noted, gums appear normal without bleeding, no Ludwig's angina, tongue sits flat in the bottom of the mouth, no angioedema, no facial erythema or warmth, no facial swelling, moves neck without difficulty, normal phonation without stridor, trismus or drooling. NECK: Supple, normal ROM CARD: RRR; S1 and S2 appreciated; no murmurs, no clicks, no rubs, no gallops RESP: Normal chest excursion without splinting or tachypnea; breath sounds clear and equal bilaterally; no wheezes, no rhonchi, no rales, no hypoxia or respiratory distress, speaking full sentences ABD/GI: Normal bowel sounds; non-distended; soft, non-tender, no rebound, no guarding, no  peritoneal signs BACK: The back appears normal EXT: Normal ROM in all joints; no deformity noted, no edema; no cyanosis SKIN: Normal color for age and race; warm; no rash on exposed skin NEURO: Moves all extremities equally, normal speech PSYCH: The patient's mood and manner are appropriate.   ED Results / Procedures / Treatments   LABS: (all labs ordered are listed, but only abnormal results are displayed) Labs Reviewed  GROUP A STREP BY PCR - Abnormal; Notable for the following components:      Result Value   Group A Strep by PCR DETECTED (*)    All other components within normal limits  CBC WITH DIFFERENTIAL/PLATELET - Abnormal; Notable for the  following components:   WBC 14.3 (*)    Neutro Abs 12.1 (*)    All other components within normal limits  COMPREHENSIVE METABOLIC PANEL - Abnormal; Notable for the following components:   Sodium 134 (*)    AST 297 (*)    ALT 173 (*)    All other components within normal limits  URINALYSIS, ROUTINE W REFLEX MICROSCOPIC - Abnormal; Notable for the following components:   Color, Urine YELLOW (*)    APPearance CLEAR (*)    Hgb urine dipstick LARGE (*)    All other components within normal limits  CK - Abnormal; Notable for the following components:   Total CK 44,343 (*)    All other components within normal limits  SARS CORONAVIRUS 2 BY RT PCR  MAGNESIUM  MONONUCLEOSIS SCREEN     EKG:   Date: 10/04/2021  Rate: 118  Rhythm: Sinus tachycardia  QRS Axis: normal  Intervals: normal  ST/T Wave abnormalities: normal  Conduction Disutrbances: none  Narrative Interpretation: Sinus tachycardia, incomplete right bundle branch block which is new compared to previous     RADIOLOGY: My personal review and interpretation of imaging:    I have personally reviewed all radiology reports.   No results found.   PROCEDURES:  Critical Care performed: Yes, see critical care procedure note(s)   CRITICAL CARE Performed by: Rochele Raring   Total critical care time: 40 minutes  Critical care time was exclusive of separately billable procedures and treating other patients.  Critical care was necessary to treat or prevent imminent or life-threatening deterioration.  Critical care was time spent personally by me on the following activities: development of treatment plan with patient and/or surrogate as well as nursing, discussions with consultants, evaluation of patient's response to treatment, examination of patient, obtaining history from patient or surrogate, ordering and performing treatments and interventions, ordering and review of laboratory studies, ordering and review of radiographic  studies, pulse oximetry and re-evaluation of patient's condition.   Marland Kitchen1-3 Lead EKG Interpretation  Performed by: Namita Yearwood, Layla Maw, DO Authorized by: Marchella Hibbard, Layla Maw, DO     Interpretation: abnormal     ECG rate:  110   ECG rate assessment: tachycardic     Rhythm: sinus tachycardia     Ectopy: none     Conduction: normal       IMPRESSION / MDM / ASSESSMENT AND PLAN / ED COURSE  I reviewed the triage vital signs and the nursing notes.    Patient here with complaints of muscle aches, fevers, chills, sore throat.  The patient is on the cardiac monitor to evaluate for evidence of arrhythmia and/or significant heart rate changes.   DIFFERENTIAL DIAGNOSIS (includes but not limited to):   Rhabdomyolysis, electrolyte derangement, dehydration, COVID-19, other viral URI, strep pharyngitis, mononucleosis, dental infection,  no sign of facial cellulitis or  Ludwigs, doubt deep space neck infection, PTA, meningitis, bacteremia   Patient's presentation is most consistent with acute presentation with potential threat to life or bodily function.   PLAN: We will obtain CBC, CMP, magnesium level, COVID and strep swabs, Monospot, urinalysis.  Will give IV fluids, Tylenol, Toradol for symptomatic relief.  He is most concerned about rhabdomyolysis given his history of McArdle syndrome.  We will add on a CK level but I doubt this is what is causing his fever.   MEDICATIONS GIVEN IN ED: Medications  lactated ringers infusion (has no administration in time range)  amoxicillin (AMOXIL) 250 MG/5ML suspension 500 mg (has no administration in time range)  HYDROmorphone (DILAUDID) injection 1 mg (has no administration in time range)  lactated ringers bolus 2,000 mL (0 mLs Intravenous Stopped 10/04/21 0503)  acetaminophen (TYLENOL) tablet 1,000 mg (1,000 mg Oral Given 10/04/21 0402)  ketorolac (TORADOL) 30 MG/ML injection 30 mg (30 mg Intravenous Given 10/04/21 0403)  morphine (PF) 4 MG/ML injection 4 mg (4  mg Intravenous Given 10/04/21 0521)  ondansetron (ZOFRAN) injection 4 mg (4 mg Intravenous Given 10/04/21 0521)  lactated ringers bolus 1,000 mL (1,000 mLs Intravenous New Bag/Given 10/04/21 0537)     ED COURSE: Patient's labs show leukocytosis of 14,000 with left shift.  He has elevated liver function test which appear to be chronic for him.  Normal electrolytes.  COVID-negative.  Urine shows no sign of infection.  CK level is elevated at 44,000.  Will give third liter of fluid.  He will need admission for rhabdo.  He is positive for strep throat.  We will start him on amoxicillin.  He states he cannot swallow capsules or pills and is requesting liquid.  Discussed with pharmacist that we could give IM penicillin but given I am also concerned that there could be some component of a dental infection although no drainable abscess on exam or facial cellulitis, I feel he will need 10 days of oral antibiotics for full coverage.  Discussed with hospitalist for admission for nontraumatic rhabdomyolysis.   CONSULTS:  Consulted and discussed patient's case with hospitalist, Dr. Arville Care.  I have recommended admission and consulting physician agrees and will place admission orders.  Patient (and family if present) agree with this plan.   I reviewed all nursing notes, vitals, pertinent previous records.  All labs, EKGs, imaging ordered have been independently reviewed and interpreted by myself.    OUTSIDE RECORDS REVIEWED: Reviewed patient's recent admission from 09/26/2021 to 09/28/2021 for nontraumatic rhabdomyolysis.       FINAL CLINICAL IMPRESSION(S) / ED DIAGNOSES   Final diagnoses:  Fever, unspecified fever cause  Pain due to dental caries  Strep pharyngitis  Non-traumatic rhabdomyolysis     Rx / DC Orders   ED Discharge Orders     None        Note:  This document was prepared using Dragon voice recognition software and may include unintentional dictation errors.   Eagan Shifflett, Layla Maw,  DO 10/04/21 [phone]

## 2021-10-04 NOTE — ED Notes (Signed)
Informed RN bed assigned 

## 2021-10-04 NOTE — H&P (Signed)
History and Physical    Patient: Roberto Stanton FKC:[id number] DOB: November 06, 1995 DOA: 10/04/2021 DOS: the patient was seen and examined on 10/04/2021 PCP: Rutherford Limerick, PA  Patient coming from: Home  Chief Complaint:  Chief Complaint  Patient presents with   Fever   HPI: Roberto Stanton is a 26 y.o. male with medical history significant for recurrent nontraumatic rhabdomyolysis, history of  McArdle's disease who was recently discharged from the hospital following a hospitalization for rhabdomyolysis from  09/26/21 - 09/28/21.  He presents to the ER for evaluation of severe cramping pain in his legs and lower back pain similar to his prior episodes of rhabdomyolysis. He rates his pain a 10 x 10 in intensity at its worst.  He also complains of fever, chills and a sore throat.  He denies having any sick contacts.  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  He states that he has tried to stay hydrated but may not have taking as much fluid as he needs to. He denies having any shortness of breath, no headache, no dizziness, no lightheadedness, no urinary symptoms, no changes in his bowel habits, no nausea, no vomiting, no blurred vision no focal deficit. His total CK is 44,343.  He has marked transaminitis as well. Group A strep PCR is positive He will be admitted to the hospital for further evaluation.    Review of Systems: As mentioned in the history of present illness. All other systems reviewed and are negative. Past Medical History:  Diagnosis Date   Kidney stones    McArdle disease ([place]) 09/28/2021   Pericarditis    Rhabdomyolysis    History reviewed. No pertinent surgical history. Social History:  reports that he has quit smoking. He has never used smokeless tobacco. He reports current alcohol use. He reports that he does not currently use drugs after having used the following drugs: Marijuana.  Allergies   Allergen Reactions   Chlorhexidine Rash    No family history on file.  Prior to Admission medications   Medication Sig Start Date End Date Taking? Authorizing Provider  oxyCODONE 10 MG TABS Take 0.5-1 tablets (5-10 mg total) by mouth every 6 (six) hours as needed for severe pain or moderate pain. Patient not taking: Reported on 10/04/2021 09/28/21   Emeterio Reeve, DO    Physical Exam: Vitals:   10/04/21 0730 10/04/21 0735 10/04/21 0740 10/04/21 0900  BP: 121/63   114/70  Pulse: (!) 101 (!) 101 98 88  Resp: _0 Temp: 98.3 F (36.8 C)     TempSrc: Oral     SpO2: 95% 94% 95% 97%  Weight:      Height:      Vitals and nursing note reviewed.  Constitutional:      Appearance: Normal appearance.  HENT:     Head: Normocephalic and atraumatic.     Nose: Nose normal.     Mouth/Throat:     Mouth: Mucous membranes are moist.  Eyes:     Conjunctiva/sclera: Conjunctivae normal.     Pupils: Pupils are equal, round, and reactive to light.  Cardiovascular:     Rate and Rhythm: Normal rate and regular rhythm.  Pulmonary:     Effort: Pulmonary effort is normal.     Breath sounds: Normal breath sounds.  Abdominal:     General: Abdomen is flat. Bowel sounds are normal.  Palpations: Abdomen is soft.  Musculoskeletal:        General: Normal range of motion.     Cervical back: Normal range of motion and neck supple.  Skin:    General: Skin is warm and dry.  Neurological:     General: No focal deficit present.     Mental Status: He is alert.  Psychiatric:        Mood and Affect: Mood normal.        Behavior: Behavior normal.     Data Reviewed: Relevant notes from primary care and specialist visits, past discharge summaries as available in EHR, including Care Everywhere. Prior diagnostic testing as pertinent to current admission diagnoses Updated medications and problem lists for reconciliation ED course, including vitals, labs, imaging, treatment and response to  treatment Triage notes, nursing and pharmacy notes and ED provider's notes Notable results as noted in HPI Labs reviewed.  Total CK 44,343, sodium 134, potassium 3.9, chloride 101, bicarb 25, glucose 92, BUN 16, creatinine 0.83, calcium 9.3, total protein 8.1, albumin 4.2, AST 297, ALT 173, alk phos 78, total bilirubin 0.7, magnesium 1.9, monoscreen is negative, white count 14.3, hemoglobin 16.1, hematocrit 47, RDW 12.3, platelet count 219 Group A strep by PCR detected Twelve-lead EKG reviewed by me shows sinus tachycardia with an incomplete right bundle branch block There are no new results to review at this time.  Assessment and Plan: * Rhabdomyolysis Secondary to known history of McArdle's disease resulting in recurrent nontraumatic rhabdomyolysis. Total CK is elevated at 43,000 Aggressive IV fluid hydration Symptom control Check daily CK levels  Transaminitis Secondary to severe rhabdomyolysis Expect improvement in patient's liver enzymes following resolution of acute illness Avoid hepatotoxic agents  McArdle disease ([place]) Treatment as outlined in 1 Patient advised to follow-up at [facility] with his neurologist  Strep pharyngitis Patient presents for evaluation of a sore throat, subjective fever and chills has a white count of 14,000 Group A strep by PCR was detected We will treat patient empirically with amoxicillin 500 mg twice daily to complete a 10-day course of therapy      Advance Care Planning:   Code Status: Full Code   Consults: None  Family Communication: Greater than 50% of time was spent discussing patient's condition and plan of care with him at the bedside.  All questions and concerns have been addressed.  He verbalizes understanding and agrees with the plan.  Severity of Illness: The appropriate patient status for this patient is INPATIENT. Inpatient status is judged to be reasonable and necessary in order to provide the required intensity of service to ensure the  patient's safety. The patient's presenting symptoms, physical exam findings, and initial radiographic and laboratory data in the context of their chronic comorbidities is felt to place them at high risk for further clinical deterioration. Furthermore, it is not anticipated that the patient will be medically stable for discharge from the hospital within 2 midnights of admission.   * I certify that at the point of admission it is my clinical judgment that the patient will require inpatient hospital care spanning beyond 2 midnights from the point of admission due to high intensity of service, high risk for further deterioration and high frequency of surveillance required.*  Author: Collier Bullock, MD 10/04/2021 9:45 AM  For on call review [email or web address].

## 2021-10-04 NOTE — Assessment & Plan Note (Signed)
Patient presents for evaluation of a sore throat, subjective fever and chills has a white count of 14,000 Group A strep by PCR was detected We will treat patient empirically with amoxicillin 500 mg twice daily to complete a 10-day course of therapy

## 2021-10-04 NOTE — ED Triage Notes (Signed)
Patient ambulatory to triage with steady gait, without difficulty or distress noted; pt reports that he "overdid it yesterday"; c/o fever this morning with chills and sore throat with leg/muscle pain; recent hospitalization for rhabdo

## 2021-10-04 NOTE — Assessment & Plan Note (Signed)
Secondary to severe rhabdomyolysis Expect improvement in patient's liver enzymes following resolution of acute illness Avoid hepatotoxic agents

## 2021-10-04 NOTE — Assessment & Plan Note (Signed)
Treatment as outlined in 1 Patient advised to follow-up at [facility] with his neurologist

## 2021-10-05 LAB — BASIC METABOLIC PANEL
Anion gap: 6 (ref 5–15)
Anion gap: 9 (ref 5–15)
BUN: 7 mg/dL (ref 6–20)
BUN: 7 mg/dL (ref 6–20)
CO2: 28 mmol/L (ref 22–32)
CO2: 27 mmol/L (ref 22–32)
Calcium: 8.5 mg/dL — ABNORMAL LOW (ref 8.9–10.3)
Calcium: 8.9 mg/dL (ref 8.9–10.3)
Chloride: 103 mmol/L (ref 98–111)
Chloride: 102 mmol/L (ref 98–111)
Creatinine, Ser: 0.72 mg/dL (ref 0.61–1.24)
Creatinine, Ser: 0.79 mg/dL (ref 0.61–1.24)
GFR, Estimated: >60 mL/min (ref >60)
GFR, Estimated: >60 mL/min (ref >60)
Glucose, Bld: 86 mg/dL (ref 70–99)
Glucose, Bld: 113 mg/dL — ABNORMAL HIGH (ref 70–99)
Potassium: 3.5 mmol/L (ref 3.5–5.1)
Potassium: 3.7 mmol/L (ref 3.5–5.1)
Sodium: 137 mmol/L (ref 135–145)
Sodium: 138 mmol/L (ref 135–145)

## 2021-10-05 LAB — HEPATIC FUNCTION PANEL
ALT: 166 U/L — ABNORMAL HIGH (ref 0–44)
AST: 252 U/L — ABNORMAL HIGH (ref 15–41)
Albumin: 3.4 g/dL — ABNORMAL LOW (ref 3.5–5.0)
Alkaline Phosphatase: 55 U/L (ref 38–126)
Bilirubin, Direct: <0.1 mg/dL (ref 0.0–0.2)
Total Bilirubin: 0.3 mg/dL (ref 0.3–1.2)
Total Protein: 6.7 g/dL (ref 6.5–8.1)

## 2021-10-05 LAB — CBC
HCT: 39.5 % (ref 39.0–52.0)
Hemoglobin: 13.5 g/dL (ref 13.0–17.0)
MCH: 31.5 pg (ref 26.0–34.0)
MCHC: 34.2 g/dL (ref 30.0–36.0)
MCV: 92.3 fL (ref 80.0–100.0)
Platelets: 177 10*3/uL (ref 150–400)
RBC: 4.28 MIL/uL (ref 4.22–5.81)
RDW: 12.6 % (ref 11.5–15.5)
WBC: 8.7 10*3/uL (ref 4.0–10.5)
nRBC: 0 % (ref 0.0–0.2)

## 2021-10-05 LAB — CK: Total CK: 29828 U/L — ABNORMAL HIGH (ref 49–397)

## 2021-10-05 NOTE — Progress Notes (Signed)
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 Ck 44,000 on arrival, down to 30,000 today, was 14,000 at discharge earlier this month. Kidney function and potassium wnl - continue fluids - bmp bid - possible d/c tomorrow - will need close neurology f/u, continue to push fluids, avoid all strenuous activity even moderately so - gave dietary advice today  # GAS pharyngitis No signs abscess on exam, symptomatically improving - cont amoxicillin   DVT prophylaxis: lovenox Code Status: full Family Communication: none @ bedside  Level of care: Progressive Status is: Inpatient Remains inpatient appropriate because: need for ivf    Consultants:  none  Procedures: none  Antimicrobials:  none    Subjective: Muscle and throat pain improving  Objective: Vitals:   10/04/21 2344 10/05/21 0410 10/05/21 0756 10/05/21 0759  BP: 128/89 136/81  135/89  Pulse: 86 87 88 84  Resp: 18 18    Temp: 98 F (36.7 C) 98.1 F (36.7 C)  97.7 F (36.5 C)  TempSrc:    Oral  SpO2: 99% 98% 100% 100%  Weight:      Height:        Intake/Output Summary (Last 24 hours) at 10/05/2021 1033 Last data filed at 10/05/2021 0600 Gross per 24 hour  Intake 2399.58 ml  Output 1825 ml  Net 574.58 ml   Filed Weights   10/04/21 0327  Weight: 66.7 kg    Examination:  General exam: Appears calm and comfortable  Ent: swollen erythematous tonsils uvula midline Respiratory system: Clear to auscultation. Respiratory effort  normal. Cardiovascular system: S1 & S2 heard, RRR. No JVD, murmurs, rubs, gallops or clicks. No pedal edema. Gastrointestinal system: Abdomen is nondistended, soft and nontender. No organomegaly or masses felt. Normal bowel sounds heard. Central nervous system: Alert and oriented. No focal neurological deficits. Extremities: Symmetric 5 x 5 power. Skin: No rashes, lesions or ulcers Psychiatry: Judgement and insight appear normal. Mood & affect appropriate.     Data Reviewed: I have personally reviewed following labs and imaging studies  CBC: Recent Labs  Lab 10/04/21 0339 10/05/21 0514  WBC 14.3* 8.7  NEUTROABS 12.1*  --   HGB 16.1 13.5  HCT 47.0 39.5  MCV 91.4 92.3  PLT 219 177   Basic Metabolic Panel: Recent Labs  Lab 10/04/21 0339 10/05/21 0514  NA 134* 137  K 3.9 3.5  CL 101 103  CO2 25 28  GLUCOSE 92 86  BUN 16 7  CREATININE 0.83 0.72  CALCIUM 9.3 8.5*  MG 1.9  --    GFR: Estimated Creatinine Clearance: 130.9 mL/min (by C-G formula based on SCr of 0.72 mg/dL). Liver Function Tests: Recent Labs  Lab 10/04/21 0339  AST 297*  ALT 173*  ALKPHOS 78  BILITOT 0.7  PROT 8.1  ALBUMIN 4.2   No results for input(s): "LIPASE", "AMYLASE" in the last 168 hours. No results for input(s): "AMMONIA" in the last 168 hours. Coagulation Profile: No results for input(s): "INR", "PROTIME" in the last 168 hours. Cardiac Enzymes: Recent Labs  Lab  10/04/21 0339 10/05/21 0514  CKTOTAL 81,275* 17,001*   BNP (last 3 results) No results for input(s): "PROBNP" in the last 8760 hours. HbA1C: No results for input(s): "HGBA1C" in the last 72 hours. CBG: No results for input(s): "GLUCAP" in the last 168 hours. Lipid Profile: No results for input(s): "CHOL", "HDL", "LDLCALC", "TRIG", "CHOLHDL", "LDLDIRECT" in the last 72 hours. Thyroid Function Tests: No results for input(s): "TSH", "T4TOTAL", "FREET4", "T3FREE", "THYROIDAB" in the last 72 hours. Anemia Panel: No results  for input(s): "VITAMINB12", "FOLATE", "FERRITIN", "TIBC", "IRON", "RETICCTPCT" in the last 72 hours. Urine analysis:    Component Value Date/Time   COLORURINE YELLOW (A) 10/04/2021 0504   APPEARANCEUR CLEAR (A) 10/04/2021 0504   LABSPEC 1.011 10/04/2021 0504   PHURINE 6.0 10/04/2021 0504   GLUCOSEU NEGATIVE 10/04/2021 0504   HGBUR LARGE (A) 10/04/2021 0504   BILIRUBINUR NEGATIVE 10/04/2021 0504   KETONESUR NEGATIVE 10/04/2021 0504   PROTEINUR NEGATIVE 10/04/2021 0504   NITRITE NEGATIVE 10/04/2021 0504   LEUKOCYTESUR NEGATIVE 10/04/2021 0504   Sepsis Labs: @LABRCNTIP (procalcitonin:4,lacticidven:4)  ) Recent Results (from the past 240 hour(s))  Respiratory (~20 pathogens) panel by PCR     Status: Abnormal   Collection Time: 09/26/21 11:43 AM   Specimen: Nasopharyngeal Swab; Respiratory  Result Value Ref Range Status   Adenovirus NOT DETECTED NOT DETECTED Final   Coronavirus 229E NOT DETECTED NOT DETECTED Final    Comment: (NOTE) The Coronavirus on the Respiratory Panel, DOES NOT test for the novel  Coronavirus (2019 nCoV)    Coronavirus HKU1 NOT DETECTED NOT DETECTED Final   Coronavirus NL63 NOT DETECTED NOT DETECTED Final   Coronavirus OC43 NOT DETECTED NOT DETECTED Final   Metapneumovirus NOT DETECTED NOT DETECTED Final   Rhinovirus / Enterovirus DETECTED (A) NOT DETECTED Final   Influenza A NOT DETECTED NOT DETECTED Final   Influenza B NOT DETECTED NOT DETECTED Final   Parainfluenza Virus 1 NOT DETECTED NOT DETECTED Final   Parainfluenza Virus 2 NOT DETECTED NOT DETECTED Final   Parainfluenza Virus 3 NOT DETECTED NOT DETECTED Final   Parainfluenza Virus 4 NOT DETECTED NOT DETECTED Final   Respiratory Syncytial Virus NOT DETECTED NOT DETECTED Final   Bordetella pertussis NOT DETECTED NOT DETECTED Final   Bordetella Parapertussis NOT DETECTED NOT DETECTED Final   Chlamydophila pneumoniae NOT DETECTED NOT DETECTED Final   Mycoplasma pneumoniae NOT DETECTED NOT DETECTED  Final    Comment: Performed at [facility], [address], [place], [place]  Group A Strep by PCR [facility] Only)     Status: Abnormal   Collection Time: 10/04/21  3:46 AM   Specimen: Throat; Sterile Swab  Result Value Ref Range Status   Group A Strep by PCR DETECTED (A) NOT DETECTED Final    Comment: Performed at [facility], [address] [place], [place]  SARS Coronavirus 2 by RT PCR (hospital order, performed in [facility] hospital lab) *cepheid single result test* Throat     Status: None   Collection Time: 10/04/21  3:46 AM   Specimen: Throat; Nasal Swab  Result Value Ref Range Status   SARS Coronavirus 2 by RT PCR NEGATIVE NEGATIVE Final    Comment: (NOTE) SARS-CoV-2 target nucleic acids are NOT DETECTED.  The SARS-CoV-2 RNA is generally detectable in upper and lower respiratory specimens during the acute phase of infection. The lowest concentration of SARS-CoV-2 viral copies this assay can detect is 250 copies / mL. A negative result does not preclude SARS-CoV-2  infection and should not be used as the sole basis for treatment or other patient management decisions.  A negative result may occur with improper specimen collection / handling, submission of specimen other than nasopharyngeal swab, presence of viral mutation(s) within the areas targeted by this assay, and inadequate number of viral copies (<250 copies / mL). A negative result must be combined with clinical observations, patient history, and epidemiological information.  Fact Sheet for Patients:   https://www.patel.info/  Fact Sheet for Healthcare Providers: https://hall.com/  This test is not yet approved or  cleared by the [place] FDA and has been authorized for detection and/or diagnosis of SARS-CoV-2 by FDA under an Emergency Use Authorization (EUA).  This EUA will remain in effect (meaning this test can be used) for the  duration of the COVID-19 declaration under Section 564(b)(1) of the Act, 21 U.S.C. section 360bbb-3(b)(1), unless the authorization is terminated or revoked sooner.  Performed at [facility], [address] [place], [place] [address]          Radiology Studies: No results found.      Scheduled Meds:  amoxicillin  500 mg Oral Q12H   enoxaparin (LOVENOX) injection  40 mg Subcutaneous Q24H   Continuous Infusions:  sodium chloride 200 mL/hr at 10/05/21 0940   lactated ringers 125 mL/hr at 10/04/21 [id number]     LOS: 1 day     Desma Maxim, MD [facility]   If 7PM-7AM, please contact night-coverage [email or web address] Password [place] [place] 10/05/2021, 10:33 AM

## 2021-10-06 LAB — COMPREHENSIVE METABOLIC PANEL
ALT: 162 U/L — ABNORMAL HIGH (ref 0–44)
AST: 185 U/L — ABNORMAL HIGH (ref 15–41)
Albumin: 3.5 g/dL (ref 3.5–5.0)
Alkaline Phosphatase: 61 U/L (ref 38–126)
Anion gap: 4 — ABNORMAL LOW (ref 5–15)
BUN: 7 mg/dL (ref 6–20)
CO2: 29 mmol/L (ref 22–32)
Calcium: 8.8 mg/dL — ABNORMAL LOW (ref 8.9–10.3)
Chloride: 104 mmol/L (ref 98–111)
Creatinine, Ser: 0.74 mg/dL (ref 0.61–1.24)
GFR, Estimated: >60 mL/min (ref >60)
Glucose, Bld: 87 mg/dL (ref 70–99)
Potassium: 3.7 mmol/L (ref 3.5–5.1)
Sodium: 137 mmol/L (ref 135–145)
Total Bilirubin: 0.5 mg/dL (ref 0.3–1.2)
Total Protein: 7.3 g/dL (ref 6.5–8.1)

## 2021-10-06 LAB — CK: Total CK: 19714 U/L — ABNORMAL HIGH (ref 49–397)

## 2021-10-06 MED ORDER — AMOXICILLIN 250 MG/5ML PO SUSR: 500.0000 mg | Freq: Two times a day (BID) | ORAL | 0 refills | Status: AC

## 2021-10-06 MED ORDER — OXYCODONE HCL 10 MG PO TABS: 10.0000 mg | ORAL_TABLET | Freq: Four times a day (QID) | ORAL | 0 refills | Status: DC | PRN

## 2021-10-06 NOTE — Discharge Summary (Signed)
Roberto Stanton EUM:[id number] DOB: 1995/07/17 DOA: 10/04/2021  PCP: Gildardo Pounds, PA  Admit date: 10/04/2021 Discharge date: 10/06/2021  Time spent: 35 minutes  Recommendations for Outpatient Follow-up:  Close f/u with [facility] Pcp f/u 1 week, advise checking ck and kidney function and electrolytes at that time     Discharge Diagnoses:  Principal Problem:   Rhabdomyolysis Active Problems:   Transaminitis   McArdle disease (HCC)   Strep pharyngitis   Discharge Condition: stable  Diet recommendation: carbohydrate righ  Filed Weights   10/04/21 0327  Weight: 66.7 kg    History of present illness:  From admission h and p  Roberto Stanton is a 26 y.o. male with medical history significant for recurrent nontraumatic rhabdomyolysis, history of  McArdle's disease who was recently discharged from the hospital following a hospitalization for rhabdomyolysis from  09/26/21 - 09/28/21.  He presents to the ER for evaluation of severe cramping pain in his legs and lower back pain similar to his prior episodes of rhabdomyolysis. He rates his pain a 10 x 10 in intensity at its worst.  He also complains of fever, chills and a sore throat.  He denies having any sick contacts.  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  He states that he has tried to stay hydrated but may not have taking as much fluid as he needs to. He denies having any shortness of breath, no headache, no dizziness, no lightheadedness, no urinary symptoms, no changes in his bowel habits, no nausea, no vomiting, no blurred vision no focal deficit. His total CK is 44,343.  He has marked transaminitis as well. Group A strep PCR is positive He will be admitted to the hospital for further evaluation.  Hospital Course:  Patient with a history of mcardle syndrome and this is his third admission this year for rhabdomyolysis. Here ck in the 40K range,  improved to under 20K with IVF and rest. No aki or hyperkalemia, making good urine. No significant recent activity but works as a Nutritional therapist and so his work does involve at least moderate exertion. Possible trigger strep throat (no signs abscess) which we are treating with oral amoxicillin. Discussed dietary modifications (carb rich diet, sucrose load prior to activity) and discussed importance of only light activity henceforth at least until he follows up with his [facility] neurologist. Advised pcp f/u in 1 week to check symptoms and labs.  Procedures: none   Consultations: None though case discussed with Dr. Selina Cooley of neurology  Discharge Exam: Vitals:   10/06/21 0355 10/06/21 0824  BP: 122/81 119/88  Pulse: (!) 58 72  Resp: 18 18  Temp: 97.8 F (36.6 C) (!) 97.5 F (36.4 C)  SpO2: 100% 99%    General: nad Cardiovascular: rrr Respiratory: ctab Abdomen: soft  Discharge Instructions   Discharge Instructions     Diet general   Complete by: As directed    Increase activity slowly   Complete by: As directed       Allergies as of 10/06/2021       Reactions   Chlorhexidine Rash        Medication List     TAKE these medications    amoxicillin 250 MG/5ML suspension Commonly known as: AMOXIL Take 10 mLs (500 mg total) by mouth every 12 (twelve) hours for 8 days.   Oxycodone HCl 10 MG Tabs Take 1 tablet (10 mg total) by  mouth every 6 (six) hours as needed. What changed:  how much to take reasons to take this       Allergies  Allergen Reactions   Chlorhexidine Rash      The results of significant diagnostics from this hospitalization (including imaging, microbiology, ancillary and laboratory) are listed below for reference.    Significant Diagnostic Studies: US Venous Img Lower Unilateral Right (DVT)  Result Date: 09/26/2021 CLINICAL DATA:  Right leg pain and swelling EXAM: RIGHT LOWER EXTREMITY VENOUS DOPPLER ULTRASOUND TECHNIQUE: Gray-scale sonography with  graded compression, as well as color Doppler and duplex ultrasound were performed to evaluate the lower extremity deep venous systems from the level of the common femoral vein and including the common femoral, femoral, profunda femoral, popliteal and calf veins including the posterior tibial, peroneal and gastrocnemius veins when visible. The superficial great saphenous vein was also interrogated. Spectral Doppler was utilized to evaluate flow at rest and with distal augmentation maneuvers in the common femoral, femoral and popliteal veins. COMPARISON:  None Available. FINDINGS: Contralateral Common Femoral Vein: Respiratory phasicity is normal and symmetric with the symptomatic side. No evidence of thrombus. Normal compressibility. Common Femoral Vein: No evidence of thrombus. Normal compressibility, respiratory phasicity and response to augmentation. Saphenofemoral Junction: No evidence of thrombus. Normal compressibility and flow on color Doppler imaging. Profunda Femoral Vein: No evidence of thrombus. Normal compressibility and flow on color Doppler imaging. Femoral Vein: No evidence of thrombus. Normal compressibility, respiratory phasicity and response to augmentation. Popliteal Vein: No evidence of thrombus. Normal compressibility, respiratory phasicity and response to augmentation. Calf Veins: No evidence of thrombus. Normal compressibility and flow on color Doppler imaging. Superficial Great Saphenous Vein: No evidence of thrombus. Normal compressibility. Venous Reflux:  None. Other Findings:  None. IMPRESSION: No evidence of right lower extremity deep venous thrombosis. Electronically Signed   By: Duanne Guess D.O.   On: 09/26/2021 14:01    Microbiology: Recent Results (from the past 240 hour(s))  Respiratory (~20 pathogens) panel by PCR     Status: Abnormal   Collection Time: 09/26/21 11:43 AM   Specimen: Nasopharyngeal Swab; Respiratory  Result Value Ref Range Status   Adenovirus NOT DETECTED  NOT DETECTED Final   Coronavirus 229E NOT DETECTED NOT DETECTED Final    Comment: (NOTE) The Coronavirus on the Respiratory Panel, DOES NOT test for the novel  Coronavirus (2019 nCoV)    Coronavirus HKU1 NOT DETECTED NOT DETECTED Final   Coronavirus NL63 NOT DETECTED NOT DETECTED Final   Coronavirus OC43 NOT DETECTED NOT DETECTED Final   Metapneumovirus NOT DETECTED NOT DETECTED Final   Rhinovirus / Enterovirus DETECTED (A) NOT DETECTED Final   Influenza A NOT DETECTED NOT DETECTED Final   Influenza B NOT DETECTED NOT DETECTED Final   Parainfluenza Virus 1 NOT DETECTED NOT DETECTED Final   Parainfluenza Virus 2 NOT DETECTED NOT DETECTED Final   Parainfluenza Virus 3 NOT DETECTED NOT DETECTED Final   Parainfluenza Virus 4 NOT DETECTED NOT DETECTED Final   Respiratory Syncytial Virus NOT DETECTED NOT DETECTED Final   Bordetella pertussis NOT DETECTED NOT DETECTED Final   Bordetella Parapertussis NOT DETECTED NOT DETECTED Final   Chlamydophila pneumoniae NOT DETECTED NOT DETECTED Final   Mycoplasma pneumoniae NOT DETECTED NOT DETECTED Final    Comment: Performed at [facility], [address], [place], [place] [address]  Group A Strep by PCR [facility] Only)     Status: Abnormal   Collection Time: 10/04/21  3:46 AM   Specimen:  Throat; Sterile Swab  Result Value Ref Range Status   Group A Strep by PCR DETECTED (A) NOT DETECTED Final    Comment: Performed at [facility], [address] [place], [place] [address]  SARS Coronavirus 2 by RT PCR (hospital order, performed in [facility] hospital lab) *cepheid single result test* Throat     Status: None   Collection Time: 10/04/21  3:46 AM   Specimen: Throat; Nasal Swab  Result Value Ref Range Status   SARS Coronavirus 2 by RT PCR NEGATIVE NEGATIVE Final    Comment: (NOTE) SARS-CoV-2 target nucleic acids are NOT DETECTED.  The SARS-CoV-2 RNA is generally detectable in upper and lower respiratory specimens during the acute  phase of infection. The lowest concentration of SARS-CoV-2 viral copies this assay can detect is 250 copies / mL. A negative result does not preclude SARS-CoV-2 infection and should not be used as the sole basis for treatment or other patient management decisions.  A negative result may occur with improper specimen collection / handling, submission of specimen other than nasopharyngeal swab, presence of viral mutation(s) within the areas targeted by this assay, and inadequate number of viral copies (<250 copies / mL). A negative result must be combined with clinical observations, patient history, and epidemiological information.  Fact Sheet for Patients:   RoadLapTop.co.za  Fact Sheet for Healthcare Providers: http://kim-miller.com/  This test is not yet approved or  cleared by the [place] FDA and has been authorized for detection and/or diagnosis of SARS-CoV-2 by FDA under an Emergency Use Authorization (EUA).  This EUA will remain in effect (meaning this test can be used) for the duration of the COVID-19 declaration under Section 564(b)(1) of the Act, 21 U.S.C. section 360bbb-3(b)(1), unless the authorization is terminated or revoked sooner.  Performed at [facility], [address] [place], [place] [address]      Labs: Basic Metabolic Panel: Recent Labs  Lab 10/04/21 0339 10/05/21 0514 10/05/21 1430 10/06/21 0519  NA 134* 137 138 137  K 3.9 3.5 3.7 3.7  CL 101 103 102 104  CO2 25 28 27 29   GLUCOSE 92 86 113* 87  BUN 16 7 7 7   CREATININE 0.83 0.72 0.79 0.74  CALCIUM 9.3 8.5* 8.9 8.8*  MG 1.9  --   --   --    Liver Function Tests: Recent Labs  Lab 10/04/21 0339 10/05/21 0514 10/06/21 0519  AST 297* 252* 185*  ALT 173* 166* 162*  ALKPHOS 78 55 61  BILITOT 0.7 0.3 0.5  PROT 8.1 6.7 7.3  ALBUMIN 4.2 3.4* 3.5   No results for input(s): "LIPASE", "AMYLASE" in the last 168 hours. No results for  input(s): "AMMONIA" in the last 168 hours. CBC: Recent Labs  Lab 10/04/21 0339 10/05/21 0514  WBC 14.3* 8.7  NEUTROABS 12.1*  --   HGB 16.1 13.5  HCT 47.0 39.5  MCV 91.4 92.3  PLT 219 177   Cardiac Enzymes: Recent Labs  Lab 10/04/21 0339 10/05/21 0514 10/06/21 0519  CKTOTAL 44,343* 10/07/21* 19,714*   BNP: BNP (last 3 results) No results for input(s): "BNP" in the last 8760 hours.  ProBNP (last 3 results) No results for input(s): "PROBNP" in the last 8760 hours.  CBG: No results for input(s): "GLUCAP" in the last 168 hours.     Signed:  10/08/21 MD.  [facility] 10/06/2021, 10:46 AM

## 2021-10-06 NOTE — Discharge Instructions (Signed)
Only light activities until you follow up with your neurologist

## 2021-10-06 NOTE — TOC CM/SW Note (Signed)
Patient has orders to discharge home today. Chart reviewed. PCP is Unknown Foley, [place]. On room air. No wounds. Patient does not have insurance so gave GoodRx coupons for Oxycodone and Amoxil. He confirmed he does have a ride home today. No further concerns. CSW signing off.  Charlynn Court, CSW [phone]

## 2021-11-29 ENCOUNTER — Other Ambulatory Visit: Payer: Self-pay

## 2021-11-29 ENCOUNTER — Emergency Department
Admission: EM | Admit: 2021-11-29 | Discharge: 2021-11-30 | Disposition: A | Discharge status: Home / Self Care | Payer: Medicaid Other | Attending: Emergency Medicine | Admitting: Emergency Medicine

## 2021-11-29 DIAGNOSIS — M6282 Rhabdomyolysis: Secondary | ICD-10-CM | POA: Insufficient documentation

## 2021-11-29 DIAGNOSIS — Z20822 Contact with and (suspected) exposure to covid-19: Secondary | ICD-10-CM | POA: Insufficient documentation

## 2021-11-29 DIAGNOSIS — J101 Influenza due to other identified influenza virus with other respiratory manifestations: Secondary | ICD-10-CM | POA: Insufficient documentation

## 2021-11-29 LAB — COMPREHENSIVE METABOLIC PANEL
ALT: 56 U/L — ABNORMAL HIGH (ref 0–44)
AST: 62 U/L — ABNORMAL HIGH (ref 15–41)
Albumin: 4.3 g/dL (ref 3.5–5.0)
Alkaline Phosphatase: 83 U/L (ref 38–126)
Anion gap: 5 (ref 5–15)
BUN: 15 mg/dL (ref 6–20)
CO2: 31 mmol/L (ref 22–32)
Calcium: 9.4 mg/dL (ref 8.9–10.3)
Chloride: 102 mmol/L (ref 98–111)
Creatinine, Ser: 0.76 mg/dL (ref 0.61–1.24)
GFR, Estimated: >60 mL/min (ref >60)
Glucose, Bld: 90 mg/dL (ref 70–99)
Potassium: 3.8 mmol/L (ref 3.5–5.1)
Sodium: 138 mmol/L (ref 135–145)
Total Bilirubin: 0.8 mg/dL (ref 0.3–1.2)
Total Protein: 8.4 g/dL — ABNORMAL HIGH (ref 6.5–8.1)

## 2021-11-29 LAB — CBC WITH DIFFERENTIAL/PLATELET
Abs Immature Granulocytes: 0.03 10*3/uL (ref 0.00–0.07)
Basophils Absolute: 0.1 10*3/uL (ref 0.0–0.1)
Basophils Relative: 1 %
Eosinophils Absolute: 0.2 10*3/uL (ref 0.0–0.5)
Eosinophils Relative: 2 %
HCT: 43.6 % (ref 39.0–52.0)
Hemoglobin: 15 g/dL (ref 13.0–17.0)
Immature Granulocytes: 0 %
Lymphocytes Relative: 11 %
Lymphs Abs: 0.9 10*3/uL (ref 0.7–4.0)
MCH: 31.8 pg (ref 26.0–34.0)
MCHC: 34.4 g/dL (ref 30.0–36.0)
MCV: 92.6 fL (ref 80.0–100.0)
Monocytes Absolute: 0.6 10*3/uL (ref 0.1–1.0)
Monocytes Relative: 7 %
Neutro Abs: 6.3 10*3/uL (ref 1.7–7.7)
Neutrophils Relative %: 79 %
Platelets: 209 10*3/uL (ref 150–400)
RBC: 4.71 MIL/uL (ref 4.22–5.81)
RDW: 12.4 % (ref 11.5–15.5)
WBC: 8.1 10*3/uL (ref 4.0–10.5)
nRBC: 0 % (ref 0.0–0.2)

## 2021-11-29 LAB — CK
Total CK: 2871 U/L — ABNORMAL HIGH (ref 49–397)
Total CK: 2703 U/L — ABNORMAL HIGH (ref 49–397)

## 2021-11-29 LAB — RESP PANEL BY RT-PCR (FLU A&B, COVID) ARPGX2
Influenza A by PCR: NEGATIVE
Influenza B by PCR: POSITIVE — AB
SARS Coronavirus 2 by RT PCR: NEGATIVE

## 2021-11-29 MED ORDER — ACETAMINOPHEN 500 MG PO TABS
1000.0000 mg | ORAL_TABLET | Freq: Once | ORAL | Status: AC
  Administered 2021-11-29: 1000 mg via ORAL
  Filled 2021-11-29: qty 2

## 2021-11-29 MED ORDER — OXYCODONE-ACETAMINOPHEN 5-325 MG PO TABS
1.0000 | ORAL_TABLET | ORAL | Status: DC | PRN
  Administered 2021-11-29: 1 via ORAL
  Filled 2021-11-29: qty 1

## 2021-11-29 MED ORDER — SODIUM CHLORIDE 0.9 % IV BOLUS
1000.0000 mL | Freq: Once | INTRAVENOUS | Status: AC
  Administered 2021-11-29: 1000 mL via INTRAVENOUS

## 2021-11-29 MED ORDER — OXYCODONE HCL 5 MG PO TABS
5.0000 mg | ORAL_TABLET | Freq: Once | ORAL | Status: AC
  Administered 2021-11-29: 5 mg via ORAL
  Filled 2021-11-29: qty 1

## 2021-11-29 NOTE — ED Provider Notes (Signed)
[facility] Provider Note    Event Date/Time   First MD Initiated Contact with Patient 11/29/21 2144     (approximate)   History   Chief Complaint Fever, Chills, Generalized Body Aches, and Cough   HPI  Roberto Stanton is a 26 y.o. male with past medical history of McArdle's disease who presents to the ED complaining of body aches.  Patient reports that over the past 24 hours he has been dealing with increasing body aches across all 4 of his extremities and back.  This has been associated with subjective fevers and chills along with nonproductive cough and generalized malaise.  He states his roommate has been sick with the flu and he is concerned he also has the flu.  He has dealt with rhabdomyolysis on multiple occasions due to his severe McArdle disease, reports current symptoms feel similar.  He denies any vomiting or diarrhea, states his p.o. intake has been normal.  He denies any recent strenuous physical activity, reports his neurologist at [facility] had taken him out of work due to his disease.     Physical Exam   Triage Vital Signs: ED Triage Vitals  Enc Vitals Group     BP 11/29/21 1740 (!) 142/99     Pulse Rate 11/29/21 1740 (!) 108     Resp 11/29/21 1740 16     Temp 11/29/21 1740 98.4 F (36.9 C)     Temp Source 11/29/21 1740 Oral     SpO2 11/29/21 1740 94 %     Weight 11/29/21 1742 140 lb (63.5 kg)     Height 11/29/21 1742 5\' 9"  (1.753 m)     Head Circumference --      Peak Flow --      Pain Score 11/29/21 1742 8     Pain Loc --      Pain Edu? --      Excl. in [place]? --     Most recent vital signs: Vitals:   11/29/21 2111 11/30/21 0130  BP: 131/76 130/85  Pulse: (!) 122 (!) 115  Resp: 16 16  Temp: 98.5 F (36.9 C) 99.2 F (37.3 C)  SpO2: 100% 100%    Constitutional: Alert and oriented. Eyes: Conjunctivae are normal. Head: Atraumatic. Nose: No congestion/rhinnorhea. Mouth/Throat: Mucous membranes are moist.  Cardiovascular:  Tachycardic, regular rhythm. Grossly normal heart sounds.  2+ radial pulses bilaterally. Respiratory: Normal respiratory effort.  No retractions. Lungs CTAB. Gastrointestinal: Soft and nontender. No distention. Musculoskeletal: No lower extremity tenderness nor edema.  Neurologic:  Normal speech and language. No gross focal neurologic deficits are appreciated.    ED Results / Procedures / Treatments   Labs (all labs ordered are listed, but only abnormal results are displayed) Labs Reviewed  RESP PANEL BY RT-PCR (FLU A&B, COVID) ARPGX2 - Abnormal; Notable for the following components:      Result Value   Influenza B by PCR POSITIVE (*)    All other components within normal limits  COMPREHENSIVE METABOLIC PANEL - Abnormal; Notable for the following components:   Total Protein 8.4 (*)    AST 62 (*)    ALT 56 (*)    All other components within normal limits  CK - Abnormal; Notable for the following components:   Total CK 2,871 (*)    All other components within normal limits  CK - Abnormal; Notable for the following components:   Total CK 2,703 (*)    All other components within normal limits  CBC WITH DIFFERENTIAL/PLATELET    PROCEDURES:  Critical Care performed: No  Procedures   MEDICATIONS ORDERED IN ED: Medications  sodium chloride 0.9 % bolus 1,000 mL (0 mLs Intravenous Stopped 11/29/21 2239)  oxyCODONE (Oxy IR/ROXICODONE) immediate release tablet 5 mg (5 mg Oral Given 11/29/21 2230)  acetaminophen (TYLENOL) tablet 1,000 mg (1,000 mg Oral Given 11/29/21 2230)  oxyCODONE (Oxy IR/ROXICODONE) immediate release tablet 5 mg (5 mg Oral Given 11/30/21 0129)     IMPRESSION / MDM / ASSESSMENT AND PLAN / ED COURSE  I reviewed the triage vital signs and the nursing notes.                              26 y.o. male with past medical history of McArdle's disease who presents to the ED complaining of severe body aches over the past 24 hours with cough, subjective fevers and  chills.  Patient's presentation is most consistent with acute presentation with potential threat to life or bodily function.  Differential diagnosis includes, but is not limited to, COVID-19, influenza, AKI, electrolyte abnormality, rhabdomyolysis.  Patient uncomfortable but nontoxic-appearing, vital signs remarkable for tachycardia but otherwise reassuring.  Patient has severe body aches and tested positive for influenza here in the ED, which is likely contributing to his symptoms.  With his McArdle disease, he also deals with recurrent rhabdomyolysis, was previously admitted with CK level of greater than 40,000 along with transaminitis.  His labs actually appear improved today following his previous admission with CK currently elevated at around 2800 and his transaminases almost normalized.  No evidence of AKI or electrolyte abnormality, no significant anemia or leukocytosis noted.  We will hydrate with IV fluids and treat symptomatically with oxycodone and Tylenol, would avoid NSAIDs in this patient at risk for worsening rhabdomyolysis.  Plan to reassess and trend CK level, if downtrending he would be appropriate for outpatient management.  Patient turned over to oncoming divider pending repeat CK level and reassessment.      FINAL CLINICAL IMPRESSION(S) / ED DIAGNOSES   Final diagnoses:  Influenza B  Non-traumatic rhabdomyolysis     Rx / DC Orders   ED Discharge Orders          Ordered    oxyCODONE (ROXICODONE) 5 MG immediate release tablet  Every 8 hours PRN        11/30/21 0058             Note:  This document was prepared using Dragon voice recognition software and may include unintentional dictation errors.   Chesley Noon, MD 12/02/21 2109

## 2021-11-29 NOTE — ED Triage Notes (Signed)
Pt to ED for body aches, cough, muscle pain, fever since this AM.  Pt has McArdle disease and has had rhabdo several times, last time CK was 40,000 and had AKI. Saw specialist recently and CK was still in thousands.   Fever today was 101, took theraflu around 2pm. Temp now 98.4.  Also lower back pain.

## 2021-11-29 NOTE — ED Provider Triage Note (Signed)
  Emergency Medicine Provider Triage Evaluation Note  Roberto Stanton , a 26 y.o.male,  was evaluated in triage.  Pt complains of fever.  He states that he has been developing a fever and cough/congestion of the past few days.  Additionally states that he has a history of McArdle disease and believe he may have rhabdomyolysis at this time.   Review of Systems  Positive: Fever, congestion Negative: Denies fever, chest pain, vomiting  Physical Exam  There were no vitals filed for this visit. Gen:   Awake, no distress   Resp:  Normal effort  MSK:   Moves extremities without difficulty  Other:    Medical Decision Making  Given the patient's initial medical screening exam, the following diagnostic evaluation has been ordered. The patient will be placed in the appropriate treatment space, once one is available, to complete the evaluation and treatment. I have discussed the plan of care with the patient and I have advised the patient that an ED physician or mid-level practitioner will reevaluate their condition after the test results have been received, as the results may give them additional insight into the type of treatment they may need.    Diagnostics: Labs, respiratory panel  Treatments: none immediately   Teodoro Spray, [place] 11/29/21 1659

## 2021-11-30 MED ORDER — OXYCODONE HCL 5 MG PO TABS: 5.0000 mg | ORAL_TABLET | Freq: Three times a day (TID) | ORAL | 0 refills | Status: DC | PRN

## 2021-11-30 MED ORDER — OXYCODONE HCL 5 MG PO TABS
5.0000 mg | ORAL_TABLET | Freq: Once | ORAL | Status: AC
  Administered 2021-11-30: 5 mg via ORAL
  Filled 2021-11-30: qty 1

## 2021-11-30 NOTE — Discharge Instructions (Signed)
Please take Tylenol and ibuprofen/Advil for your pain.  It is safe to take them together, or to alternate them every few hours.  Take up to 1000mg  of Tylenol at a time, up to 4 times per day.  Do not take more than 4000 mg of Tylenol in 24 hours.  For ibuprofen, take 400-600 mg, 3 - 4 times per day.  Stay hydrated

## 2021-11-30 NOTE — ED Provider Notes (Signed)
Downtrending CK, feeling better.  We will discharge per original plan of care.   Vladimir Crofts, MD 11/30/21 0100

## 2022-01-25 ENCOUNTER — Observation Stay
Admission: EM | Admit: 2022-01-25 | Discharge: 2022-01-28 | Disposition: A | Discharge status: Home / Self Care | Payer: Medicaid Other | Attending: Student in an Organized Health Care Education/Training Program | Admitting: Student in an Organized Health Care Education/Training Program

## 2022-01-25 ENCOUNTER — Encounter: Payer: Self-pay | Admitting: Emergency Medicine

## 2022-01-25 ENCOUNTER — Other Ambulatory Visit: Payer: Self-pay

## 2022-01-25 ENCOUNTER — Emergency Department: Payer: Medicaid Other

## 2022-01-25 DIAGNOSIS — E7404 McArdle disease: Secondary | ICD-10-CM | POA: Diagnosis present

## 2022-01-25 DIAGNOSIS — Z1152 Encounter for screening for COVID-19: Secondary | ICD-10-CM | POA: Insufficient documentation

## 2022-01-25 DIAGNOSIS — Z87891 Personal history of nicotine dependence: Secondary | ICD-10-CM | POA: Diagnosis not present

## 2022-01-25 DIAGNOSIS — Z87442 Personal history of urinary calculi: Secondary | ICD-10-CM | POA: Diagnosis not present

## 2022-01-25 DIAGNOSIS — E876 Hypokalemia: Secondary | ICD-10-CM | POA: Diagnosis present

## 2022-01-25 DIAGNOSIS — M549 Dorsalgia, unspecified: Secondary | ICD-10-CM | POA: Diagnosis not present

## 2022-01-25 DIAGNOSIS — M6282 Rhabdomyolysis: Principal | ICD-10-CM | POA: Diagnosis present

## 2022-01-25 DIAGNOSIS — R079 Chest pain, unspecified: Secondary | ICD-10-CM | POA: Diagnosis not present

## 2022-01-25 DIAGNOSIS — R252 Cramp and spasm: Secondary | ICD-10-CM

## 2022-01-25 DIAGNOSIS — J984 Other disorders of lung: Secondary | ICD-10-CM | POA: Insufficient documentation

## 2022-01-25 LAB — CBC
HCT: 44.5 % (ref 39.0–52.0)
Hemoglobin: 15.1 g/dL (ref 13.0–17.0)
MCH: 30.9 pg (ref 26.0–34.0)
MCHC: 33.9 g/dL (ref 30.0–36.0)
MCV: 91 fL (ref 80.0–100.0)
Platelets: 284 10*3/uL (ref 150–400)
RBC: 4.89 MIL/uL (ref 4.22–5.81)
RDW: 12.9 % (ref 11.5–15.5)
WBC: 6.8 10*3/uL (ref 4.0–10.5)
nRBC: 0 % (ref 0.0–0.2)

## 2022-01-25 LAB — BASIC METABOLIC PANEL
Anion gap: 5 (ref 5–15)
BUN: 15 mg/dL (ref 6–20)
CO2: 28 mmol/L (ref 22–32)
Calcium: 9.1 mg/dL (ref 8.9–10.3)
Chloride: 104 mmol/L (ref 98–111)
Creatinine, Ser: 0.7 mg/dL (ref 0.61–1.24)
GFR, Estimated: >60 mL/min (ref >60)
Glucose, Bld: 98 mg/dL (ref 70–99)
Potassium: 4.2 mmol/L (ref 3.5–5.1)
Sodium: 137 mmol/L (ref 135–145)

## 2022-01-25 LAB — TROPONIN I (HIGH SENSITIVITY)
Troponin I (High Sensitivity): 3 ng/L (ref <18)
Troponin I (High Sensitivity): 4 ng/L (ref <18)

## 2022-01-25 LAB — CK: Total CK: 1903 U/L — ABNORMAL HIGH (ref 49–397)

## 2022-01-25 MED ORDER — OXYCODONE HCL 5 MG PO TABS
5.0000 mg | ORAL_TABLET | Freq: Once | ORAL | Status: AC
  Administered 2022-01-25: 5 mg via ORAL
  Filled 2022-01-25: qty 1

## 2022-01-25 MED ORDER — ACETAMINOPHEN 500 MG PO TABS
1000.0000 mg | ORAL_TABLET | Freq: Once | ORAL | Status: DC
  Filled 2022-01-25: qty 2

## 2022-01-25 MED ORDER — SODIUM CHLORIDE 0.9 % IV BOLUS
1000.0000 mL | Freq: Once | INTRAVENOUS | Status: AC
  Administered 2022-01-25: 1000 mL via INTRAVENOUS

## 2022-01-25 NOTE — ED Provider Notes (Signed)
[facility] Provider Note    Event Date/Time   First MD Initiated Contact with Patient 01/25/22 2220     (approximate)   History   Back Pain and Chest Pain   HPI  LAWRNCE HEYES is a 26 y.o. 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  He has had elevated CKs up to 90,000 in the past.  This most recent bout started approximately 1 day ago with no major contributing events except for doing the laundry, though he notes that there are no particular inciting events that lead to these episodes.  He notes no other preceding illnesses like respiratory infectious illnesses, other trauma, abdominal pain, urinary symptoms.  History was obtained via patient.  I also reviewed external medical notes including the discharge summary from August 2023 for nontraumatic rhabdomyolysis with severe pain and a total CK of 44,000 and transaminitis which got better with fluids and pain medication.      Physical Exam   Triage Vital Signs: ED Triage Vitals  Enc Vitals Group     BP 01/25/22 1655 (!) 129/98     Pulse Rate 01/25/22 1655 85     Resp 01/25/22 1655 18     Temp 01/25/22 1655 98.2 F (36.8 C)     Temp Source 01/25/22 1655 Oral     SpO2 01/25/22 1655 97 %     Weight 01/25/22 1656 140 lb (63.5 kg)     Height 01/25/22 1656 5\' 9"  (1.753 m)     Head Circumference --      Peak Flow --      Pain Score 01/25/22 1702 8     Pain Loc --      Pain Edu? --      Excl. in [place]? --     Most recent vital signs: Vitals:   01/25/22 1655 01/25/22 2110  BP: (!) 129/98 (!) 130/97  Pulse: 85 94  Resp: 18 18  Temp: 98.2 F (36.8 C) 98.6 F (37 C)  SpO2: 97% 99%    General: Awake, no distress.  CV:  Good peripheral perfusion.  Resp:  Normal effort.  Abd:  No distention.   Other:  Tenderness palpation of the legs, chest wall with normal hemodynamics.  Alert and oriented   ED Results / Procedures / Treatments   Labs (all labs ordered are listed, but only abnormal results are displayed) Labs Reviewed  CK - Abnormal; Notable for the following components:      Result Value   Total CK 1,903 (*)    All other components within normal limits  RESP PANEL BY RT-PCR (FLU A&B, COVID) ARPGX2  BASIC METABOLIC PANEL  CBC  URINALYSIS, ROUTINE W REFLEX MICROSCOPIC  TROPONIN I (HIGH SENSITIVITY)  TROPONIN I (HIGH SENSITIVITY)     I reviewed labs and they are notable for elevated CK of 1900, and negative troponin x 2  EKG  ED ECG REPORT I, Lucillie Garfinkel, the attending physician, personally viewed and interpreted this ECG.   Date: 01/25/2022  EKG Time: 1738  Rate: 98  Rhythm: normal sinus rhythm  Axis: nl  Intervals:none  ST&T Change: No acute ischemic changes    RADIOLOGY I independently reviewed and interpreted chest x-ray and see no obvious focalities or pneumothorax   PROCEDURES:  Critical Care performed: No  Procedures  MEDICATIONS ORDERED IN ED: Medications  sodium chloride 0.9 % bolus 1,000 mL (has no administration in time range)  acetaminophen (TYLENOL) tablet 1,000 mg (has no administration in time range)  oxyCODONE (Oxy IR/ROXICODONE) immediate release tablet 5 mg (has no administration in time range)     IMPRESSION / MDM / ASSESSMENT AND PLAN / ED COURSE  I reviewed the triage vital signs and the nursing notes.                              Differential diagnosis includes, but is not limited to, atraumatic rhabdo, infection, renal failure, electrolyte disturbances, ACS, pericarditis    MDM: Patient with McArdle's disease and recurrent rhabdomyolysis with symptoms similar to prior, no obvious inciting event, and a nonischemic EKG with no signs of pericarditis and flat troponins x 2.  He has elevated CK and normal renal function,  and the CK is not as marked as prior episodes.  Will treat with IV fluids and pain control with Tylenol and oxycodone and reassess for symptomatic improvement and recheck CK.  He is in agreement with plan if symptoms are much improved with pain medications and CK not uptrending then plan for outpatient prescription for pain management and outpatient follow-up. Patient's presentation is most consistent with acute presentation with potential threat to life or bodily function.        FINAL CLINICAL IMPRESSION(S) / ED DIAGNOSES   Final diagnoses:  Non-traumatic rhabdomyolysis     Rx / DC Orders   ED Discharge Orders     None        Note:  This document was prepared using Dragon voice recognition software and may include unintentional dictation errors.    Pilar Jarvis, MD 01/25/22 2249

## 2022-01-25 NOTE — ED Triage Notes (Signed)
Patient arrives ambulatory by POV c/o dark urine, back pain and leg pain. Patient went to PCP last week and was wanting pt to have CK level checked. Patient states he waited hoping for symptoms to resolve. C/o shock sensation in chest intermittently since yesterday.

## 2022-01-25 NOTE — ED Provider Notes (Signed)
-----------------------------------------   11:09 PM on 01/25/2022 -----------------------------------------  Assuming care from Dr. Modesto Charon.  In short, Roberto Stanton is a 26 y.o. male with a chief complaint of possible rhabdo.  Refer to the original H&P for additional details.  The current plan of care is to provide IV hydration and reassess CK level to determine need to stay in the hospital vs outpatient management.   Clinical Course as of 01/26/22 0210  Fri Jan 26, 2022  0034 Basic metabolic panel(!) Repeat basic metabolic panel is reassuring with no elevation of creatinine or decrease of GFR [CF]  0111 CK Total(!): 2,654 Unfortunately the patient's CK has gone up to 2654 in spite of fluids.  I reassessed the patient.  He continues to have generalized body aches all over.  He is tolerating oral intake.  I discussed with the patient and he said he has been admitted multiple times in the past for similar issues.  Given his worsening CK in spite of IV fluids and his history of McArdle disease, he is at significant risk of severe worsening of his disease process possibly leading to renal failure.  He agrees with the plan for admission.  I ordered an additional LR 2 L IV bolus. [CF]  0113 Consulting the hospitalist team for admission. [CF]  0210 Consulted with Dr. Para March with the hospitalist service who will admit the patient. [CF]    Clinical Course User Index [CF] Loleta Rose, MD     Medications  acetaminophen (TYLENOL) tablet 1,000 mg (1,000 mg Oral Not Given 01/25/22 2254)  morphine (PF) 4 MG/ML injection 4 mg (4 mg Intravenous Given 01/26/22 0131)  oxyCODONE (Oxy IR/ROXICODONE) immediate release tablet 5 mg (has no administration in time range)  enoxaparin (LOVENOX) injection 40 mg (has no administration in time range)  acetaminophen (TYLENOL) tablet 650 mg (has no administration in time range)    Or  acetaminophen (TYLENOL) suppository 650 mg (has no administration in time  range)  ondansetron (ZOFRAN) tablet 4 mg (has no administration in time range)    Or  ondansetron (ZOFRAN) injection 4 mg (has no administration in time range)  0.9 %  sodium chloride infusion (has no administration in time range)  sodium chloride 0.9 % bolus 1,000 mL (0 mLs Intravenous Stopped 01/25/22 2324)  oxyCODONE (Oxy IR/ROXICODONE) immediate release tablet 5 mg (5 mg Oral Given 01/25/22 2254)  lactated ringers bolus 2,000 mL (2,000 mLs Intravenous New Bag/Given 01/26/22 0126)     ED Discharge Orders     None      Final diagnoses:  Non-traumatic rhabdomyolysis  McArdle disease (HCC)  Muscle cramps     Loleta Rose, MD 01/26/22 0210

## 2022-01-26 ENCOUNTER — Other Ambulatory Visit: Payer: Self-pay

## 2022-01-26 DIAGNOSIS — Z87891 Personal history of nicotine dependence: Secondary | ICD-10-CM | POA: Diagnosis not present

## 2022-01-26 DIAGNOSIS — M6282 Rhabdomyolysis: Principal | ICD-10-CM | POA: Diagnosis present

## 2022-01-26 DIAGNOSIS — R252 Cramp and spasm: Secondary | ICD-10-CM | POA: Diagnosis not present

## 2022-01-26 DIAGNOSIS — Z87442 Personal history of urinary calculi: Secondary | ICD-10-CM | POA: Diagnosis not present

## 2022-01-26 DIAGNOSIS — Z1152 Encounter for screening for COVID-19: Secondary | ICD-10-CM | POA: Diagnosis not present

## 2022-01-26 DIAGNOSIS — E876 Hypokalemia: Secondary | ICD-10-CM | POA: Diagnosis present

## 2022-01-26 DIAGNOSIS — E7404 McArdle disease: Secondary | ICD-10-CM | POA: Diagnosis present

## 2022-01-26 LAB — HEPATIC FUNCTION PANEL
ALT: 45 U/L — ABNORMAL HIGH (ref 0–44)
AST: 44 U/L — ABNORMAL HIGH (ref 15–41)
Albumin: 3.6 g/dL (ref 3.5–5.0)
Alkaline Phosphatase: 73 U/L (ref 38–126)
Bilirubin, Direct: <0.1 mg/dL (ref 0.0–0.2)
Total Bilirubin: 0.5 mg/dL (ref 0.3–1.2)
Total Protein: 7 g/dL (ref 6.5–8.1)

## 2022-01-26 LAB — BASIC METABOLIC PANEL
Anion gap: 4 — ABNORMAL LOW (ref 5–15)
BUN: 12 mg/dL (ref 6–20)
CO2: 26 mmol/L (ref 22–32)
Calcium: 8.5 mg/dL — ABNORMAL LOW (ref 8.9–10.3)
Chloride: 106 mmol/L (ref 98–111)
Creatinine, Ser: 0.8 mg/dL (ref 0.61–1.24)
GFR, Estimated: >60 mL/min (ref >60)
Glucose, Bld: 132 mg/dL — ABNORMAL HIGH (ref 70–99)
Potassium: 3.4 mmol/L — ABNORMAL LOW (ref 3.5–5.1)
Sodium: 136 mmol/L (ref 135–145)

## 2022-01-26 LAB — URINALYSIS, ROUTINE W REFLEX MICROSCOPIC
Bilirubin Urine: NEGATIVE
Glucose, UA: NEGATIVE mg/dL
Hgb urine dipstick: NEGATIVE
Ketones, ur: NEGATIVE mg/dL
Leukocytes,Ua: NEGATIVE
Nitrite: NEGATIVE
Protein, ur: NEGATIVE mg/dL
Specific Gravity, Urine: 1.01 (ref 1.005–1.030)
pH: 7 (ref 5.0–8.0)

## 2022-01-26 LAB — CK: Total CK: 2654 U/L — ABNORMAL HIGH (ref 49–397)

## 2022-01-26 LAB — RESP PANEL BY RT-PCR (FLU A&B, COVID) ARPGX2
Influenza A by PCR: NEGATIVE
Influenza B by PCR: NEGATIVE
SARS Coronavirus 2 by RT PCR: NEGATIVE

## 2022-01-26 MED ORDER — OXYCODONE HCL 5 MG PO TABS
5.0000 mg | ORAL_TABLET | Freq: Three times a day (TID) | ORAL | Status: DC | PRN
  Administered 2022-01-26 – 2022-01-28 (×3): 5 mg via ORAL
  Filled 2022-01-26 (×4): qty 1

## 2022-01-26 MED ORDER — ENOXAPARIN SODIUM 40 MG/0.4ML IJ SOSY
40.0000 mg | PREFILLED_SYRINGE | INTRAMUSCULAR | Status: DC
  Filled 2022-01-26 (×3): qty 0.4

## 2022-01-26 MED ORDER — MORPHINE SULFATE (PF) 4 MG/ML IV SOLN
4.0000 mg | INTRAVENOUS | Status: AC | PRN
  Administered 2022-01-26 (×3): 4 mg via INTRAVENOUS
  Filled 2022-01-26 (×3): qty 1

## 2022-01-26 MED ORDER — ONDANSETRON HCL 4 MG/2ML IJ SOLN
4.0000 mg | Freq: Four times a day (QID) | INTRAMUSCULAR | Status: DC | PRN
  Administered 2022-01-26 (×2): 4 mg via INTRAVENOUS
  Filled 2022-01-26 (×2): qty 2

## 2022-01-26 MED ORDER — SODIUM CHLORIDE 0.9 % IV SOLN: INTRAVENOUS | Status: DC

## 2022-01-26 MED ORDER — ACETAMINOPHEN 650 MG RE SUPP: 650.0000 mg | Freq: Four times a day (QID) | RECTAL | Status: DC | PRN

## 2022-01-26 MED ORDER — ACETAMINOPHEN 325 MG PO TABS
650.0000 mg | ORAL_TABLET | Freq: Four times a day (QID) | ORAL | Status: DC | PRN
  Administered 2022-01-28 (×2): 650 mg via ORAL
  Filled 2022-01-26 (×2): qty 2

## 2022-01-26 MED ORDER — LACTATED RINGERS IV SOLN: INTRAVENOUS | Status: DC

## 2022-01-26 MED ORDER — LACTATED RINGERS IV BOLUS
2000.0000 mL | Freq: Once | INTRAVENOUS | Status: AC
  Administered 2022-01-26: 2000 mL via INTRAVENOUS

## 2022-01-26 MED ORDER — ONDANSETRON HCL 4 MG PO TABS: 4.0000 mg | ORAL_TABLET | Freq: Four times a day (QID) | ORAL | Status: DC | PRN

## 2022-01-26 MED ORDER — HYDROMORPHONE HCL 1 MG/ML IJ SOLN
1.0000 mg | INTRAMUSCULAR | Status: DC | PRN
  Administered 2022-01-26 – 2022-01-28 (×11): 1 mg via INTRAVENOUS
  Filled 2022-01-26 (×11): qty 1

## 2022-01-26 NOTE — Progress Notes (Signed)
  INTERVAL PROGRESS NOTE    Roberto Stanton- 26 y.o. male  LOS: 0 __________________________________________________________________  SUBJECTIVE: Admitted 01/25/2022 with cc of  Chief Complaint  Patient presents with   Back Pain   Chest Pain  Rhabdomyolysis  Since admission, patient is in significant pain.   OBJECTIVE: Blood pressure 118/79, pulse 90, temperature 98.2 F (36.8 C), resp. rate 16, height 5\' 9"  (1.753 m), weight 63.5 kg, SpO2 99 %.  ASSESSMENT/PLAN:  I have reviewed the full H&P by Dr. , and I agree with the assessment and plan as outlined therein. 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 > 2654, with CK peaks to 90,000 in the past - continue IV hydration at increased rate given CK rising  - change to LR given borderline hypokalemia - continue analgesia PRN - CMP, CK am   Principal Problem:   Non-traumatic rhabdomyolysis, recurrent Active Problems:   Myophosphorylase deficiency (glycogen storage disease V, McArdle disease) (HCC)  2655, DO [facility] 01/26/2022, 8:42 AM    [email or web address] Available by Epic secure chat 7AM-7PM. If 7PM-7AM, please contact night-coverage   No Charge

## 2022-01-26 NOTE — Assessment & Plan Note (Addendum)
Myophosphorylase deficiency (glycogen storage disease V, McArdle disease) CK 1903. > 2654, with CK peaks to 90,000 in the past IV hydration Pain control  Will get hepatic function panel Rest

## 2022-01-26 NOTE — H&P (Signed)
History and Physical    Patient: Roberto Stanton CWU:[id number] DOB: [birth year] DOA: 01/25/2022 DOS: the patient was seen and examined on 01/26/2022 PCP: Gildardo Pounds, PA  Patient coming from: Home  Chief Complaint:  Chief Complaint  Patient presents with   Back Pain   Chest Pain    HPI: Roberto Stanton is a 26 y.o. male with medical history significant for myophosphorylase deficiency (glycogen storage disease V, McArdle disease), with multiple hospitalizations for nontraumatic rhabdomyolysis most recently to [facility] from 11/20 to 11/25 with CK up to 90,000, requiring hydromorphone PCA for pain control who presents to the ED with a 1 day history of diffuse muscle aches including back pain and chest pain similar to prior episodes of rhabdomyolysis.  His recent hospitalization was triggered after he played a game of basketball however at this time he denies doing anything strenuous except for doing laundry.  He states for mild episodes which are triggered by activities as simple as taking out the trash, going up stairs he usually drinks a lot of fluids and rest.  He thinks the current episode was triggered by doing laundry.  Patient used to work as a Nutritional therapist and had to stop 7 months ago because of his frequent episodes of rhabdo which all started about a year ago.  States that during his childhood he would often cramp easily and gets sore after sporting activities or working out but was only diagnosed in June 2022 via muscle biopsy.  At the present time he rates his pain 10 out of 10  ED course and data review: BP 129/98 with otherwise normal vitals.  Initial [id number] which trended up to 2654 after 2 L fluid bolus CBC and BMP were unremarkable. Patient was started on a third L of fluids and given oxycodone for pain and hospitalist consulted for admission.   Review of Systems: As mentioned in the history of present illness. All other systems reviewed and are negative.  Past Medical  History:  Diagnosis Date   Kidney stones    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 No pertinent surgical history. Social History:  reports that he has quit smoking. He has never used smokeless tobacco. He reports current alcohol use. He reports that he does not currently use drugs after having used the following drugs: Marijuana.  No Known Allergies  History reviewed. No pertinent family history.  Prior to Admission medications   Medication Sig Start Date End Date Taking? Authorizing Provider  oxyCODONE (ROXICODONE) 5 MG immediate release tablet Take 1 tablet (5 mg total) by mouth every 8 (eight) hours as needed. 11/30/21 11/30/22  Delton Prairie, MD  Oxycodone HCl 10 MG TABS Take 1 tablet (10 mg total) by mouth every 6 (six) hours as needed. 10/06/21   Kathrynn Running, MD    Physical Exam: Vitals:   01/25/22 1655 01/25/22 1656 01/25/22 2110 01/26/22 0125  BP: (!) 129/98  (!) 130/97 120/85  Pulse: 85  94 90  Resp: 18  18 16   Temp: 98.2 F (36.8 C)  98.6 F (37 C)   TempSrc: Oral  Oral   SpO2: 97%  99% 98%  Weight:  63.5 kg    Height:  5\' 9"  (1.753 m)     Physical Exam Vitals and nursing note reviewed.  Constitutional:      General: He is not in acute distress. HENT:     Head: Normocephalic and atraumatic.  Cardiovascular:     Rate and Rhythm: Normal rate and regular rhythm.     Heart sounds: Normal heart sounds.  Pulmonary:     Effort: Pulmonary effort is normal.     Breath sounds: Normal breath sounds.  Abdominal:     Palpations: Abdomen is soft.     Tenderness: There is no abdominal tenderness.  Musculoskeletal:     Comments: Diffuse tenderness on palpation  Neurological:     Mental Status: Mental status is at baseline.     Labs on Admission: I have personally reviewed following labs and imaging studies  CBC: Recent Labs  Lab 01/25/22 1705  WBC 6.8  HGB 15.1  HCT 44.5  MCV 91.0  PLT 284   Basic  Metabolic Panel: Recent Labs  Lab 01/25/22 1705 01/26/22 0005  NA 137 136  K 4.2 3.4*  CL 104 106  CO2 28 26  GLUCOSE 98 132*  BUN 15 12  CREATININE 0.70 0.80  CALCIUM 9.1 8.5*   GFR: Estimated Creatinine Clearance: 124.6 mL/min (by C-G formula based on SCr of 0.8 mg/dL). Liver Function Tests: No results for input(s): "AST", "ALT", "ALKPHOS", "BILITOT", "PROT", "ALBUMIN" in the last 168 hours. No results for input(s): "LIPASE", "AMYLASE" in the last 168 hours. No results for input(s): "AMMONIA" in the last 168 hours. Coagulation Profile: No results for input(s): "INR", "PROTIME" in the last 168 hours. Cardiac Enzymes: Recent Labs  Lab 01/25/22 1705 01/26/22 0005  CKTOTAL 1,903* 2,654*   BNP (last 3 results) No results for input(s): "PROBNP" in the last 8760 hours. HbA1C: No results for input(s): "HGBA1C" in the last 72 hours. CBG: No results for input(s): "GLUCAP" in the last 168 hours. Lipid Profile: No results for input(s): "CHOL", "HDL", "LDLCALC", "TRIG", "CHOLHDL", "LDLDIRECT" in the last 72 hours. Thyroid Function Tests: No results for input(s): "TSH", "T4TOTAL", "FREET4", "T3FREE", "THYROIDAB" in the last 72 hours. Anemia Panel: No results for input(s): "VITAMINB12", "FOLATE", "FERRITIN", "TIBC", "IRON", "RETICCTPCT" in the last 72 hours. Urine analysis:    Component Value Date/Time   COLORURINE YELLOW (A) 10/04/2021 0504   APPEARANCEUR CLEAR (A) 10/04/2021 0504   LABSPEC 1.011 10/04/2021 0504   PHURINE 6.0 10/04/2021 0504   GLUCOSEU NEGATIVE 10/04/2021 0504   HGBUR LARGE (A) 10/04/2021 0504   BILIRUBINUR NEGATIVE 10/04/2021 0504   KETONESUR NEGATIVE 10/04/2021 0504   PROTEINUR NEGATIVE 10/04/2021 0504   NITRITE NEGATIVE 10/04/2021 0504   LEUKOCYTESUR NEGATIVE 10/04/2021 0504    Radiological Exams on Admission: DG Chest 2 View  Result Date: 01/25/2022 CLINICAL DATA:  Chest pain, dark urine, back pain, leg pain, shock sensation in chest intermittently  since yesterday EXAM: CHEST - 2 VIEW COMPARISON:  11/23/2020 FINDINGS: Normal heart size, mediastinal contours, and pulmonary vascularity. Lungs appear hyperinflated but clear. No infiltrate, pleural effusion, or pneumothorax. Osseous structures unremarkable. IMPRESSION: Hyperinflated lungs without acute infiltrate. Electronically Signed   By: Ulyses Southward M.D.   On: 01/25/2022 17:21     Data Reviewed: Relevant notes from primary care and specialist visits, past discharge summaries as available in EHR, including Care Everywhere. 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 > 2654, with CK peaks to 90,000 in the  past IV hydration Pain control  Will get hepatic function panel Rest   DVT prophylaxis: Lovenox  Consults: none  Advance Care Planning:   Code Status: Prior   Family Communication: none  Disposition Plan: Back to previous home environment  Severity of Illness: The appropriate patient status for this patient is OBSERVATION. Observation status is judged to be reasonable and necessary in order to provide the required intensity of service to ensure the patient's safety. The patient's presenting symptoms, physical exam findings, and initial radiographic and laboratory data in the context of their medical condition is felt to place them at decreased risk for further clinical deterioration. Furthermore, it is anticipated that the patient will be medically stable for discharge from the hospital within 2 midnights of admission.   Author: Andris Baumann, MD 01/26/2022 1:48 AM  For on  call review [email or web address].

## 2022-01-26 NOTE — ED Notes (Signed)
The pt was administered 1mg  of dilaudid slow IV push. The medication was then flushed with NS. The pt advised the iv site and his left forearm  was burning. The pt does have some reddening to his arm. The pt denies any other complaints. IV maintenance fluid paused.

## 2022-01-27 DIAGNOSIS — R252 Cramp and spasm: Secondary | ICD-10-CM

## 2022-01-27 DIAGNOSIS — E7404 McArdle disease: Secondary | ICD-10-CM

## 2022-01-27 LAB — CBC
HCT: 42.6 % (ref 39.0–52.0)
Hemoglobin: 14.4 g/dL (ref 13.0–17.0)
MCH: 30.4 pg (ref 26.0–34.0)
MCHC: 33.8 g/dL (ref 30.0–36.0)
MCV: 89.9 fL (ref 80.0–100.0)
Platelets: 259 10*3/uL (ref 150–400)
RBC: 4.74 MIL/uL (ref 4.22–5.81)
RDW: 12.8 % (ref 11.5–15.5)
WBC: 5.8 10*3/uL (ref 4.0–10.5)
nRBC: 0 % (ref 0.0–0.2)

## 2022-01-27 LAB — COMPREHENSIVE METABOLIC PANEL
ALT: 28 U/L (ref 0–44)
AST: 50 U/L — ABNORMAL HIGH (ref 15–41)
Albumin: 1.9 g/dL — ABNORMAL LOW (ref 3.5–5.0)
Alkaline Phosphatase: 33 U/L — ABNORMAL LOW (ref 38–126)
Anion gap: 9 (ref 5–15)
BUN: 6 mg/dL (ref 6–20)
CO2: 17 mmol/L — ABNORMAL LOW (ref 22–32)
Calcium: 7.5 mg/dL — ABNORMAL LOW (ref 8.9–10.3)
Chloride: 109 mmol/L (ref 98–111)
Creatinine, Ser: <0.3 mg/dL — ABNORMAL LOW (ref 0.61–1.24)
Glucose, Bld: 66 mg/dL — ABNORMAL LOW (ref 70–99)
Potassium: 3.7 mmol/L (ref 3.5–5.1)
Sodium: 135 mmol/L (ref 135–145)
Total Bilirubin: 0.5 mg/dL (ref 0.3–1.2)
Total Protein: 3.7 g/dL — ABNORMAL LOW (ref 6.5–8.1)

## 2022-01-27 LAB — CK: Total CK: 4377 U/L — ABNORMAL HIGH (ref 49–397)

## 2022-01-27 NOTE — Progress Notes (Signed)
PROGRESS NOTE  Roberto Stanton    DOB: 1995/08/17, 26 y.o.  YOM:[id number]    Code Status: Full Code   DOA: 01/25/2022   LOS: 1   Brief hospital course  Roberto Stanton is a 26 y.o. male with a PMH significant for myophosphorylase deficiency (glycogen storage disease V, McArdle disease), with multiple hospitalizations for nontraumatic rhabdomyolysis most recently to [facility] from 11/20 to 11/25 with CK up to 90,000, requiring hydromorphone PCA for pain control.  They presented from home to the ED on 01/25/2022 with diffuse muscle aches including back pain and chest pain similar to prior episodes of rhabdomyolysis  x 1 days. Believes this was triggered by doing laundry.  In the ED, it was found that they had normal vital signs.  Significant findings included Initial [id number] which trended up to 2654 after 2 L fluid bolus CBC and BMP were unremarkable.  They were initially treated with analgesia and IV fluids.   Patient was admitted to medicine service for further workup and management of rhabdomyolysis as outlined in detail below.  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 > 2654> 4,377, with CK peaks to 90,000 in the past - continue IV hydration at increased rate given CK rising             - changed to LR given borderline hypokalemia - continue analgesia PRN - CMP, CK am  Body mass index is 20.67 kg/m.  VTE ppx: enoxaparin (LOVENOX) injection 40 mg Start: 01/26/22 0800   Diet:     Diet   Diet regular Room service appropriate? Yes; Fluid consistency: Thin   Consultants: none  Subjective 01/27/22    Pt reports improved pain. Generalized  muscle pain worse in lower extremities and back. No other complaints or concerns   Objective   Vitals:   01/26/22 1531 01/26/22 1930 01/26/22 1939 01/26/22 2015  BP: 123/77 120/83  126/89  Pulse: 86 78  76  Resp: 18 15  17   Temp: 98.2 F (36.8 C)  98.4 F (36.9 C) 98.1 F (36.7 C)  TempSrc: Oral  Oral   SpO2: 96% 97%  100%  Weight:      Height:        Intake/Output Summary (Last 24 hours) at 01/27/2022 0703 Last data filed at 01/27/2022 0440 Gross per 24 hour  Intake 2876.45 ml  Output --  Net 2876.45 ml   Filed Weights   01/25/22 1656  Weight: 63.5 kg     Physical Exam:  General: awake, alert, NAD HEENT: atraumatic, clear conjunctiva, anicteric sclera, MMM, hearing grossly normal Respiratory: normal respiratory effort. Cardiovascular: quick capillary refill, normal S1/S2, RRR, no JVD, murmurs Nervous: A&O x3. no gross focal neurologic deficits, normal speech Extremities: moves all equally, no edema, normal tone Skin: dry, intact, normal temperature, normal color. No rashes, lesions or ulcers on exposed skin Psychiatry: normal mood, congruent affect  Labs   I have personally reviewed the following labs and imaging studies CBC    Component Value Date/Time   WBC 6.8 01/25/2022 1705   RBC 4.89 01/25/2022 1705   HGB 15.1 01/25/2022 1705   HCT 44.5 01/25/2022 1705   PLT 284 01/25/2022 1705   MCV 91.0 01/25/2022 1705   MCH 30.9 01/25/2022  1705   MCHC 33.9 01/25/2022 1705   RDW 12.9 01/25/2022 1705   LYMPHSABS 0.9 11/29/2021 1746   MONOABS 0.6 11/29/2021 1746   EOSABS 0.2 11/29/2021 1746   BASOSABS 0.1 11/29/2021 1746      Latest Ref Rng & Units 01/26/2022   12:05 AM 01/25/2022    5:05 PM 11/29/2021    5:46 PM  BMP  Glucose 70 - 99 mg/dL 311  98  90   BUN 6 - 20 mg/dL 12  15  15    Creatinine 0.61 - 1.24 mg/dL  2.16  2.44   Sodium 135 - 145 mmol/L 136  137  138   Potassium 3.5 - 5.1 mmol/L 3.4  4.2  3.8   Chloride 98 - 111 mmol/L 106  104  102   CO2 22  - 32 mmol/L 26  28  31    Calcium 8.9 - 10.3 mg/dL 8.5  9.1  9.4     DG Chest 2 View  Result Date: 01/25/2022 CLINICAL DATA:  Chest pain, dark urine, back pain, leg pain, shock sensation in chest intermittently since yesterday EXAM: CHEST - 2 VIEW COMPARISON:  11/23/2020 FINDINGS: Normal heart size, mediastinal contours, and pulmonary vascularity. Lungs appear hyperinflated but clear. No infiltrate, pleural effusion, or pneumothorax. Osseous structures unremarkable. IMPRESSION: Hyperinflated lungs without acute infiltrate. Electronically Signed   By: Roberto Stanton M.D.   On: 01/25/2022 17:21    Disposition Plan & Communication  Patient status: Inpatient  Admitted From: Home Planned disposition location: Home Anticipated discharge date: 12/10 pending CK improvement  Family Communication: none    Author: 14/08/2021, DO [facility] 01/27/2022, 7:03 AM   Available by Epic secure chat 7AM-7PM. If 7PM-7AM, please contact night-coverage.  [facility] contact information found on Leeroy Bock.

## 2022-01-27 NOTE — Plan of Care (Signed)
  Problem: Clinical Measurements: Goal: Diagnostic test results will improve Outcome: Progressing   Problem: Activity: Goal: Risk for activity intolerance will decrease Outcome: Progressing   Problem: Nutrition: Goal: Adequate nutrition will be maintained Outcome: Progressing   Problem: Elimination: Goal: Will not experience complications related to bowel motility Outcome: Progressing Goal: Will not experience complications related to urinary retention Outcome: Progressing   Problem: Pain Managment: Goal: General experience of comfort will improve Outcome: Progressing   

## 2022-01-28 LAB — CK
Total CK: 4905 U/L — ABNORMAL HIGH (ref 49–397)
Total CK: 3388 U/L — ABNORMAL HIGH (ref 49–397)

## 2022-01-28 MED ORDER — POLYETHYLENE GLYCOL 3350 17 G PO PACK: 17.0000 g | PACK | Freq: Two times a day (BID) | ORAL | 0 refills | Status: DC

## 2022-01-28 MED ORDER — POLYETHYLENE GLYCOL 3350 17 G PO PACK
17.0000 g | PACK | Freq: Two times a day (BID) | ORAL | Status: DC
  Administered 2022-01-28: 17 g via ORAL
  Filled 2022-01-28: qty 1

## 2022-01-28 MED ORDER — OXYCODONE HCL 5 MG PO TABS: 5.0000 mg | ORAL_TABLET | Freq: Three times a day (TID) | ORAL | 0 refills | Status: AC | PRN

## 2022-01-28 MED ORDER — OXYCODONE HCL 5 MG PO TABS
5.0000 mg | ORAL_TABLET | Freq: Four times a day (QID) | ORAL | Status: DC | PRN
  Administered 2022-01-28: 5 mg via ORAL
  Filled 2022-01-28: qty 1

## 2022-01-28 MED ORDER — HYDROMORPHONE HCL 1 MG/ML IJ SOLN
1.0000 mg | INTRAMUSCULAR | Status: DC | PRN
  Administered 2022-01-28 (×2): 1 mg via INTRAVENOUS
  Filled 2022-01-28: qty 1

## 2022-01-28 MED ORDER — SODIUM CHLORIDE 0.9 % IV SOLN: INTRAVENOUS | Status: DC

## 2022-01-28 MED ORDER — SENNA 8.6 MG PO TABS
1.0000 | ORAL_TABLET | Freq: Every day | ORAL | Status: DC
  Administered 2022-01-28: 8.6 mg via ORAL
  Filled 2022-01-28: qty 1

## 2022-01-28 NOTE — Progress Notes (Signed)
Patient given discharge instruction papers. All questions answered. PIV removed. Patient being transported via WC to main entrance in stable condition.

## 2022-01-28 NOTE — Discharge Instructions (Signed)
Please follow up with your PCP within 1-2 weeks for a hospital follow up and continuing to monitor your CK level clearance.

## 2022-01-28 NOTE — Discharge Summary (Signed)
Physician Discharge Summary  Patient: Roberto Stanton FGH:[id number] DOB: 07-Apr-1995   Code Status: Full Code Admit date: 01/25/2022 Discharge date: 01/28/2022 Disposition: Home, No home health services recommended PCP: Gildardo Pounds, PA  Recommendations for Outpatient Follow-up:  Follow up with PCP within 1-2 weeks Regarding general hospital follow up and preventative care Recommend follow up CK  Discharge Diagnoses:  Principal Problem:   Non-traumatic rhabdomyolysis, recurrent Active Problems:   Rhabdomyolysis   Myophosphorylase deficiency (glycogen storage disease V, McArdle disease) (HCC)   Muscle cramps  Brief Hospital Course Summary: Roberto Stanton is a 26 y.o. male with a PMH significant for myophosphorylase deficiency (glycogen storage disease V, McArdle disease), with multiple hospitalizations for nontraumatic rhabdomyolysis most recently to [facility] from 11/20 to 11/25 with CK up to 90,000, requiring hydromorphone PCA for pain control.   They presented from home to the ED on 01/25/2022 with diffuse muscle aches including back pain and chest pain similar to prior episodes of rhabdomyolysis  x 1 days. Believes this was triggered by doing laundry.   In the ED, it was found that they had normal vital signs.  Significant findings included Initial [id number] which trended up to 2654 after 2 L fluid bolus CBC and BMP were unremarkable.   They were initially treated with analgesia and IV fluids. .   01/28/22 -clinically improving, CK still rising despite IV fluids. He was not exerting himself and was on bedrest. Patient wanted to go home so we planned to recheck CK again in the afternoon with continued IV fluids. CK reduced to 3,388 and his pain was moderately well controlled so he was discharged home.   Discharge Condition: Good, improved Recommended discharge diet: Regular healthy diet  Consultations: None   Procedures/Studies: None   Allergies as of 01/28/2022    No Known Allergies      Medication List     TAKE these medications    Oxycodone HCl 10 MG Tabs Take 1 tablet (10 mg total) by mouth every 6 (six) hours as needed. What changed: Another medication with the same name was added. Make sure you understand how and when to take each.   oxyCODONE 5 MG immediate release tablet Commonly known as: Roxicodone Take 1 tablet (5 mg total) by mouth every 8 (eight) hours as needed. What changed: Another medication with the same name was added. Make sure you understand how and when to take each.   oxyCODONE 5 MG immediate release tablet Commonly known as: Roxicodone Take 1 tablet (5 mg total) by mouth every 8 (eight) hours as needed for up to 3 days. What changed: You were already taking a medication with the same name, and this prescription was added. Make sure you understand how and when to take each.   polyethylene glycol 17 g packet Commonly known as: MIRALAX / GLYCOLAX Take 17 g by mouth 2 (two) times daily.         Subjective   Pt reports feeling improved. Mild soreness in his leg muscles. He would like to go home today.   All questions and concerns were addressed at time of discharge.  Objective  Blood pressure 129/88, pulse 82, temperature 98 F (36.7 C), resp. rate 17, height 5\' 9"  (1.753 m), weight 63.5 kg, SpO2 98 %.   General: Pt is alert, awake, not in acute distress Cardiovascular: RRR, S1/S2 +, no rubs, no gallops Respiratory: CTA bilaterally, no wheezing, no rhonchi Abdominal: Soft, NT, ND, bowel sounds + Extremities:  no edema, no cyanosis  The results of significant diagnostics from this hospitalization (including imaging, microbiology, ancillary and laboratory) are listed below for reference.   Imaging studies: DG Chest 2 View  Result Date: 01/25/2022 CLINICAL DATA:  Chest pain, dark urine, back pain, leg pain, shock sensation in chest intermittently since yesterday EXAM: CHEST - 2 VIEW COMPARISON:  11/23/2020  FINDINGS: Normal heart size, mediastinal contours, and pulmonary vascularity. Lungs appear hyperinflated but clear. No infiltrate, pleural effusion, or pneumothorax. Osseous structures unremarkable. IMPRESSION: Hyperinflated lungs without acute infiltrate. Electronically Signed   By: Ulyses Southward M.D.   On: 01/25/2022 17:21    Labs: Basic Metabolic Panel: Recent Labs  Lab 01/25/22 1705 01/26/22 0005 01/27/22 0552  NA 137 136 135  K 4.2 3.4* 3.7  CL 104 106 109  CO2 28 26 17*  GLUCOSE 98 132* 66*  BUN 15 12 6   CREATININE 0.70 0.80 <0.30*  CALCIUM 9.1 8.5* 7.5*   CBC: Recent Labs  Lab 01/25/22 1705 01/27/22 1435  WBC 6.8 5.8  HGB 15.1 14.4  HCT 44.5 42.6  MCV 91.0 89.9  PLT 284 259   Microbiology: Results for orders placed or performed during the hospital encounter of 01/25/22  Resp Panel by RT-PCR (Flu A&B, Covid) Anterior Nasal Swab     Status: None   Collection Time: 01/25/22 10:59 PM   Specimen: Anterior Nasal Swab  Result Value Ref Range Status   SARS Coronavirus 2 by RT PCR NEGATIVE NEGATIVE Final    Comment: (NOTE) SARS-CoV-2 target nucleic acids are NOT DETECTED.  The SARS-CoV-2 RNA is generally detectable in upper respiratory specimens during the acute phase of infection. The lowest concentration of SARS-CoV-2 viral copies this assay can detect is 138 copies/mL. A negative result does not preclude SARS-Cov-2 infection and should not be used as the sole basis for treatment or other patient management decisions. A negative result may occur with  improper specimen collection/handling, submission of specimen other than nasopharyngeal swab, presence of viral mutation(s) within the areas targeted by this assay, and inadequate number of viral copies(<138 copies/mL). A negative result must be combined with clinical observations, patient history, and epidemiological information. The expected result is Negative.  Fact Sheet for Patients:   14/07/23  Fact Sheet for Healthcare Providers:  BloggerCourse.com  This test is no t yet approved or cleared by the [employer] FDA and  has been authorized for detection and/or diagnosis of SARS-CoV-2 by FDA under an Emergency Use Authorization (EUA). This EUA will remain  in effect (meaning this test can be used) for the duration of the COVID-19 declaration under Section 564(b)(1) of the Act, 21 U.S.C.section 360bbb-3(b)(1), unless the authorization is terminated  or revoked sooner.       Influenza A by PCR NEGATIVE NEGATIVE Final   Influenza B by PCR NEGATIVE NEGATIVE Final    Comment: (NOTE) The Xpert Xpress SARS-CoV-2/FLU/RSV plus assay is intended as an aid in the diagnosis of influenza from Nasopharyngeal swab specimens and should not be used as a sole basis for treatment. Nasal washings and aspirates are unacceptable for Xpert Xpress SARS-CoV-2/FLU/RSV testing.  Fact Sheet for Patients: [place]  Fact Sheet for Healthcare Providers: BloggerCourse.com  This test is not yet approved or cleared by the [employer]ious[employer] FDA and has been authorized for detection and/or diagnosis of SARS-CoV-2 by FDA under an Emergency Use Authorization (EUA). This EUA will remain in effect (meaning this test can be used) for the duration of the COVID-19 declaration under Section  564(b)(1) of the Act, 21 U.S.C. section 360bbb-3(b)(1), unless the authorization is terminated or revoked.  Performed at [facility], [address], [place], [place] [address]    Time coordinating discharge: Over 30 minutes  Leeroy Bock, MD  [facility] 01/28/2022, 9:49 PM

## 2022-01-28 NOTE — Plan of Care (Signed)

## 2022-01-28 NOTE — Plan of Care (Signed)
  Problem: Education: Goal: Knowledge of General Education information will improve Description: Including pain rating scale, medication(s)/side effects and non-pharmacologic comfort measures Outcome: Progressing   Problem: Health Behavior/Discharge Planning: Goal: Ability to manage health-related needs will improve Outcome: Progressing   Problem: Nutrition: Goal: Adequate nutrition will be maintained Outcome: Progressing   Problem: Elimination: Goal: Will not experience complications related to bowel motility Outcome: Progressing Goal: Will not experience complications related to urinary retention Outcome: Progressing   Problem: Pain Managment: Goal: General experience of comfort will improve Outcome: Progressing   Problem: Safety: Goal: Ability to remain free from injury will improve Outcome: Progressing

## 2022-01-28 NOTE — Progress Notes (Signed)
Attempted to contact pt's mother. Attempted not successful. Notified oncoming nurse.

## 2022-01-28 NOTE — Plan of Care (Signed)
  Problem: Nutrition: Goal: Adequate nutrition will be maintained Outcome: Progressing   Problem: Activity: Goal: Risk for activity intolerance will decrease Outcome: Progressing   Problem: Elimination: Goal: Will not experience complications related to bowel motility Outcome: Progressing   Problem: Skin Integrity: Goal: Risk for impaired skin integrity will decrease Outcome: Progressing   Problem: Education: Goal: Knowledge of General Education information will improve Description: Including pain rating scale, medication(s)/side effects and non-pharmacologic comfort measures Outcome: Progressing   Problem: Pain Managment: Goal: General experience of comfort will improve Outcome: Progressing   Problem: Safety: Goal: Ability to remain free from injury will improve Outcome: Progressing

## 2022-01-28 NOTE — Progress Notes (Incomplete)
PROGRESS NOTE  Roberto Stanton    DOB: 1996/02/01, 26 y.o.  VPX:[id number]    Code Status: Full Code   DOA: 01/25/2022   LOS: 2   Brief hospital course  Roberto Stanton is a 26 y.o. male with a PMH significant for myophosphorylase deficiency (glycogen storage disease V, McArdle disease), with multiple hospitalizations for nontraumatic rhabdomyolysis most recently to [facility] from 11/20 to 11/25 with CK up to 90,000, requiring hydromorphone PCA for pain control.  They presented from home to the ED on 01/25/2022 with diffuse muscle aches including back pain and chest pain similar to prior episodes of rhabdomyolysis  x 1 days. Believes this was triggered by doing laundry.  In the ED, it was found that they had normal vital signs.  Significant findings included Initial [id number] which trended up to 2654 after 2 L fluid bolus CBC and BMP were unremarkable.  They were initially treated with analgesia and IV fluids.   Patient was admitted to medicine service for further workup and management of rhabdomyolysis as outlined in detail below.  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 > 2654> 4,377, with CK peaks to 90,000 in the past - continue IV hydration at increased rate given CK rising             - changed to LR given borderline hypokalemia - continue analgesia PRN - CMP, CK am  Body mass index is 20.67 kg/m.  VTE ppx: enoxaparin (LOVENOX) injection 40 mg Start: 01/26/22 0800   Diet:     Diet   Diet regular Room service appropriate? Yes; Fluid consistency: Thin   Consultants: none  Subjective 01/28/22    Pt reports improved  pain. Generalized muscle pain worse in lower extremities and back. No other complaints or concerns   Objective   Vitals:   01/26/22 2015 01/27/22 0841 01/27/22 1636 01/27/22 2331  BP: 126/89 108/76 117/79 129/85  Pulse: 76 86 85 71  Resp: 17 17 17 18   Temp: 98.1 F (36.7 C) 98.5 F (36.9 C) 97.8 F (36.6 C) 97.9 F (36.6 C)  TempSrc:      SpO2: 100% 97% 99% 99%  Weight:      Height:        Intake/Output Summary (Last 24 hours) at 01/28/2022 0709 Last data filed at 01/28/2022 0541 Gross per 24 hour  Intake --  Output 1900 ml  Net -1900 ml    Filed Weights   01/25/22 1656  Weight: 63.5 kg     Physical Exam:  General: awake, alert, NAD HEENT: atraumatic, clear conjunctiva, anicteric sclera, MMM, hearing grossly normal Respiratory: normal respiratory effort. Cardiovascular: quick capillary refill, normal S1/S2, RRR, no JVD, murmurs Nervous: A&O x3. no gross focal neurologic deficits, normal speech Extremities: moves all equally, no edema, normal tone Skin: dry, intact, normal temperature, normal color. No rashes, lesions or ulcers on exposed skin Psychiatry: normal mood, congruent affect  Labs   I have personally reviewed the following labs and imaging studies CBC    Component Value Date/Time   WBC 5.8 01/27/2022 1435   RBC 4.74 01/27/2022 1435   HGB 14.4 01/27/2022 1435   HCT 42.6 01/27/2022 1435   PLT 259 01/27/2022 1435   MCV  89.9 01/27/2022 1435   MCH 30.4 01/27/2022 1435   MCHC 33.8 01/27/2022 1435   RDW 12.8 01/27/2022 1435   LYMPHSABS 0.9 11/29/2021 1746   MONOABS 0.6 11/29/2021 1746   EOSABS 0.2 11/29/2021 1746   BASOSABS 0.1 11/29/2021 1746      Latest Ref Rng & Units 01/27/2022    5:52 AM 01/26/2022   12:05 AM 01/25/2022    5:05 PM  BMP  Glucose 70 - 99 mg/dL 66  371  98   BUN 6 - 20 mg/dL 6  12  15    Creatinine 0.61 - 1.24 mg/dL  <6.96  7.89   Sodium 135 - 145 mmol/L 135  136  137   Potassium 3.5 - 5.1 mmol/L 3.7  3.4  4.2   Chloride  98 - 111 mmol/L 109  106  104   CO2 22 - 32 mmol/L 17  26  28    Calcium 8.9 - 10.3 mg/dL 7.5  8.5  9.1     No results found.  Disposition Plan & Communication  Patient status: Inpatient  Admitted From: Home Planned disposition location: Home Anticipated discharge date: 12/10 pending CK improvement  Family Communication: none    Author: , DO [facility] 01/28/2022, 7:09 AM   Available by Epic secure chat 7AM-7PM. If 7PM-7AM, please contact night-coverage.  [facility] contact information found on [facility] [employer].

## 2022-02-11 ENCOUNTER — Inpatient Hospital Stay
Admission: EM | Admit: 2022-02-11 | Discharge: 2022-02-13 | DRG: 558 | Disposition: A | Discharge status: Home / Self Care | Payer: Medicaid Other | Attending: Family Medicine | Admitting: Family Medicine

## 2022-02-11 ENCOUNTER — Other Ambulatory Visit: Payer: Self-pay

## 2022-02-11 ENCOUNTER — Observation Stay: Payer: Medicaid Other

## 2022-02-11 DIAGNOSIS — R748 Abnormal levels of other serum enzymes: Secondary | ICD-10-CM | POA: Diagnosis present

## 2022-02-11 DIAGNOSIS — R252 Cramp and spasm: Secondary | ICD-10-CM | POA: Diagnosis present

## 2022-02-11 DIAGNOSIS — M6282 Rhabdomyolysis: Principal | ICD-10-CM | POA: Diagnosis present

## 2022-02-11 DIAGNOSIS — Z1152 Encounter for screening for COVID-19: Secondary | ICD-10-CM

## 2022-02-11 DIAGNOSIS — Z87891 Personal history of nicotine dependence: Secondary | ICD-10-CM

## 2022-02-11 DIAGNOSIS — E7404 McArdle disease: Secondary | ICD-10-CM | POA: Diagnosis present

## 2022-02-11 DIAGNOSIS — Z8249 Family history of ischemic heart disease and other diseases of the circulatory system: Secondary | ICD-10-CM

## 2022-02-11 LAB — COMPREHENSIVE METABOLIC PANEL
ALT: 66 U/L — ABNORMAL HIGH (ref 0–44)
AST: 84 U/L — ABNORMAL HIGH (ref 15–41)
Albumin: 4 g/dL (ref 3.5–5.0)
Alkaline Phosphatase: 76 U/L (ref 38–126)
Anion gap: 5 (ref 5–15)
BUN: 13 mg/dL (ref 6–20)
CO2: 27 mmol/L (ref 22–32)
Calcium: 9.1 mg/dL (ref 8.9–10.3)
Chloride: 104 mmol/L (ref 98–111)
Creatinine, Ser: 0.73 mg/dL (ref 0.61–1.24)
GFR, Estimated: >60 mL/min (ref >60)
Glucose, Bld: 92 mg/dL (ref 70–99)
Potassium: 3.6 mmol/L (ref 3.5–5.1)
Sodium: 136 mmol/L (ref 135–145)
Total Bilirubin: 0.9 mg/dL (ref 0.3–1.2)
Total Protein: 8.1 g/dL (ref 6.5–8.1)

## 2022-02-11 LAB — CBC WITH DIFFERENTIAL/PLATELET
Abs Immature Granulocytes: 0.01 10*3/uL (ref 0.00–0.07)
Basophils Absolute: 0.1 10*3/uL (ref 0.0–0.1)
Basophils Relative: 1 %
Eosinophils Absolute: 0.2 10*3/uL (ref 0.0–0.5)
Eosinophils Relative: 4 %
HCT: 43.3 % (ref 39.0–52.0)
Hemoglobin: 14.8 g/dL (ref 13.0–17.0)
Immature Granulocytes: 0 %
Lymphocytes Relative: 32 %
Lymphs Abs: 1.6 10*3/uL (ref 0.7–4.0)
MCH: 31.1 pg (ref 26.0–34.0)
MCHC: 34.2 g/dL (ref 30.0–36.0)
MCV: 91 fL (ref 80.0–100.0)
Monocytes Absolute: 0.5 10*3/uL (ref 0.1–1.0)
Monocytes Relative: 10 %
Neutro Abs: 2.7 10*3/uL (ref 1.7–7.7)
Neutrophils Relative %: 53 %
Platelets: 227 10*3/uL (ref 150–400)
RBC: 4.76 MIL/uL (ref 4.22–5.81)
RDW: 12.9 % (ref 11.5–15.5)
WBC: 5.2 10*3/uL (ref 4.0–10.5)
nRBC: 0 % (ref 0.0–0.2)

## 2022-02-11 LAB — URINALYSIS, ROUTINE W REFLEX MICROSCOPIC
Bilirubin Urine: NEGATIVE
Glucose, UA: NEGATIVE mg/dL
Hgb urine dipstick: NEGATIVE
Ketones, ur: NEGATIVE mg/dL
Leukocytes,Ua: NEGATIVE
Nitrite: NEGATIVE
Protein, ur: NEGATIVE mg/dL
Specific Gravity, Urine: 1.005 (ref 1.005–1.030)
pH: 7 (ref 5.0–8.0)

## 2022-02-11 LAB — RESP PANEL BY RT-PCR (RSV, FLU A&B, COVID)  RVPGX2
Influenza A by PCR: NEGATIVE
Influenza B by PCR: NEGATIVE
Resp Syncytial Virus by PCR: NEGATIVE
SARS Coronavirus 2 by RT PCR: NEGATIVE

## 2022-02-11 LAB — LACTIC ACID, PLASMA
Lactic Acid, Venous: 0.9 mmol/L (ref 0.5–1.9)
Lactic Acid, Venous: 1.1 mmol/L (ref 0.5–1.9)

## 2022-02-11 LAB — CK
Total CK: 7798 U/L — ABNORMAL HIGH (ref 49–397)
Total CK: 12029 U/L — ABNORMAL HIGH (ref 49–397)

## 2022-02-11 LAB — MAGNESIUM: Magnesium: 1.9 mg/dL (ref 1.7–2.4)

## 2022-02-11 LAB — PHOSPHORUS: Phosphorus: 3.4 mg/dL (ref 2.5–4.6)

## 2022-02-11 MED ORDER — FENTANYL CITRATE PF 50 MCG/ML IJ SOSY: 50.0000 ug | PREFILLED_SYRINGE | INTRAMUSCULAR | Status: DC | PRN

## 2022-02-11 MED ORDER — SENNOSIDES-DOCUSATE SODIUM 8.6-50 MG PO TABS
1.0000 | ORAL_TABLET | Freq: Every evening | ORAL | Status: DC | PRN
  Administered 2022-02-13: 1 via ORAL
  Filled 2022-02-11: qty 1

## 2022-02-11 MED ORDER — SODIUM CHLORIDE 0.9 % IV BOLUS
1000.0000 mL | Freq: Once | INTRAVENOUS | Status: AC
  Administered 2022-02-11: 1000 mL via INTRAVENOUS

## 2022-02-11 MED ORDER — MORPHINE SULFATE (PF) 4 MG/ML IV SOLN
4.0000 mg | Freq: Once | INTRAVENOUS | Status: AC
  Administered 2022-02-11: 4 mg via INTRAVENOUS
  Filled 2022-02-11: qty 1

## 2022-02-11 MED ORDER — OXYCODONE HCL 5 MG PO TABS
10.0000 mg | ORAL_TABLET | ORAL | Status: AC | PRN
  Administered 2022-02-11 – 2022-02-12 (×4): 10 mg via ORAL
  Filled 2022-02-11 (×5): qty 2

## 2022-02-11 MED ORDER — HEPARIN SODIUM (PORCINE) 5000 UNIT/ML IJ SOLN
5000.0000 [IU] | Freq: Three times a day (TID) | INTRAMUSCULAR | Status: DC
  Filled 2022-02-11 (×2): qty 1

## 2022-02-11 MED ORDER — FENTANYL CITRATE PF 50 MCG/ML IJ SOSY
50.0000 ug | PREFILLED_SYRINGE | INTRAMUSCULAR | Status: AC | PRN
  Administered 2022-02-11 – 2022-02-12 (×3): 50 ug via INTRAVENOUS
  Filled 2022-02-11 (×3): qty 1

## 2022-02-11 MED ORDER — OXYCODONE HCL 5 MG PO TABS: 10.0000 mg | ORAL_TABLET | ORAL | Status: DC | PRN

## 2022-02-11 MED ORDER — ONDANSETRON HCL 4 MG/2ML IJ SOLN: 4.0000 mg | Freq: Four times a day (QID) | INTRAMUSCULAR | Status: DC | PRN

## 2022-02-11 MED ORDER — ONDANSETRON HCL 4 MG/2ML IJ SOLN
4.0000 mg | Freq: Once | INTRAMUSCULAR | Status: AC
  Administered 2022-02-11: 4 mg via INTRAVENOUS
  Filled 2022-02-11: qty 2

## 2022-02-11 MED ORDER — ACETAMINOPHEN 650 MG RE SUPP: 650.0000 mg | Freq: Four times a day (QID) | RECTAL | Status: DC | PRN

## 2022-02-11 MED ORDER — SODIUM CHLORIDE 0.9 % IV SOLN: INTRAVENOUS | Status: AC

## 2022-02-11 MED ORDER — FENTANYL CITRATE PF 50 MCG/ML IJ SOSY
50.0000 ug | PREFILLED_SYRINGE | Freq: Once | INTRAMUSCULAR | Status: AC
  Administered 2022-02-11: 50 ug via INTRAVENOUS
  Filled 2022-02-11: qty 1

## 2022-02-11 MED ORDER — ACETAMINOPHEN 325 MG PO TABS: 650.0000 mg | ORAL_TABLET | Freq: Four times a day (QID) | ORAL | Status: DC | PRN

## 2022-02-11 MED ORDER — ONDANSETRON HCL 4 MG PO TABS: 4.0000 mg | ORAL_TABLET | Freq: Four times a day (QID) | ORAL | Status: DC | PRN

## 2022-02-11 NOTE — Hospital Course (Signed)
Mr. Jakie Debow is a 26 year old male with history of McArdle disease/glycogen-storage disease 5/mycophosphorylase deficiency, frequent hospitalization for nontraumatic rhabdomyolysis, who presents emergency department for chief concerns of generalized muscle cramps that started approximately 3 p.m. on 02/10/2022.  Patient states that he went hunting and did a lot of walking on December 23.  Initial vitals in the emergency department showed temperature 98.1, respiration rate of 18, heart rate of 103 and improved to 82, blood pressure 132/84, SpO2 of 97% on room air.  Serum sodium is 136, potassium 3.6, chloride 104, bicarb 27, BUN of 13, serum creatinine of 0.73, EGFR greater than 60, nonfasting blood glucose 92, WBC 5.2, hemoglobin 14.8, platelets of 227.  Lactic acid was initially 0.9 and on recheck was 1.1.  COVID/influenza A/influenza B/RSV PCR were negative.  UA was negative for nitrates and leukocytes.  CK was initially elevated at 7798 and after receiving sodium chloride 2 L fluid bolus on recheck CK was 12,029.  ED treatment: Morphine 4 mg IV 3 doses ordered, fentanyl 50 mcg one-time dose, sodium chloride 2 L bolus.

## 2022-02-11 NOTE — H&P (Addendum)
History and Physical   Roberto Stanton:[id number] DOB: Dec 07, 1995 DOA: 02/11/2022  PCP: Rutherford Limerick, PA Patient coming from: Home  I have personally briefly reviewed patient's old medical records in [place].  Chief Concern: Muscle aches  HPI: 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 on 02/10/2022.  Patient states that he went hunting and did a lot of walking on December 23.  Initial vitals in the emergency department showed temperature 98.1, respiration rate of 18, heart rate of 103 and improved to 82, blood pressure 132/84, SpO2 of 97% on room air.  Serum sodium is 136, potassium 3.6, chloride 104, bicarb 27, BUN of 13, serum creatinine of 0.73, EGFR greater than 60, nonfasting blood glucose 92, WBC 5.2, hemoglobin 14.8, platelets of 227.  Lactic acid was initially 0.9 and on recheck was 1.1.  COVID/influenza A/influenza B/RSV PCR were negative.  UA was negative for nitrates and leukocytes.  CK was initially elevated at 7798 and after receiving sodium chloride 2 L fluid bolus on recheck CK was 12,029.  ED treatment: Morphine 4 mg IV 3 doses ordered, fentanyl 50 mcg one-time dose, sodium chloride 2 L bolus.  At bedside, he is able to tell me his name, age, current calendar year.  He went hunting yesterday and got home at 3 pm and he started having muscle aches. He denies sick contacts.  He denies dysuria.  He denies trauma to his person.  He reports generalized diffuse muscle aches but is minimally relieved with the morphine IV.  He states the pain is relieved with fentanyl and IV that was given.  Social history: He lives with a room mate. He denies tobacco, etoh, and recreational drug use. He is currently  not working due to multiple hospitalizations for rhabdomyolysis. He is currently seeking disability.  ROS: Constitutional: no weight change, no fever ENT/Mouth: no sore throat, no rhinorrhea Eyes: no eye pain, no vision changes Cardiovascular: no chest pain, no dyspnea,  no edema, no palpitations Respiratory: no cough, no sputum, no wheezing Gastrointestinal: no nausea, no vomiting, no diarrhea, no constipation Genitourinary: no urinary incontinence, no dysuria, no hematuria Musculoskeletal: no arthralgias, + myalgias Skin: no skin lesions, no pruritus, Neuro: + weakness, no loss of consciousness, no syncope Psych: no anxiety, no depression, no decrease appetite Heme/Lymph: no bruising, no bleeding  ED Course: Discussed with emergency medicine provider, patient requiring hospitalization for chief concerns of elevated CK concerning for nontraumatic rhabdomyolysis.  Assessment/Plan  Principal Problem:   Elevated CK Active Problems:   Non-traumatic rhabdomyolysis, recurrent   Myophosphorylase deficiency (glycogen storage disease V, McArdle disease) (HCC)   Muscle cramps   Assessment and Plan:  * Elevated CK - Presumed secondary to nontraumatic rhabdomyolysis in setting of McArdle disease/myophosphorylase deficiency - Recheck CK to 8 hours, 3 occurrences ordered - Continue with sodium chloride 150 mL/h, 1 day ordered - Patient states to me that he wants to go home and would like to be discharged home with oxycodone.  I explained extensively at bedside that we are awaiting appropriate downtrending of CK level which we are checking in the AM.  Discharging him before appropriate downtrending CK puts patient's renal function at risk for kidney injury and/or failure.  He endorses understanding and compliance. - Recheck BMP in the a.m.  Non-traumatic rhabdomyolysis, recurrent - Presumed secondary to  increased activity in setting of McArdle disease - Recommend continued follow-up with PCP  and specialist as appropriate - Sodium chloride 1 L bolus ordered and sodium chloride IVF at 150 mL/h 1 day ordered - Continue aggressive fluid hydration and to recheck CK in the AM  Myophosphorylase deficiency (glycogen storage disease V, McArdle disease) ([place]) - Continue recommendations for hydration when at home including water and electrolytes as appropriate - Encourage healthy nutrition and avoidance of tobacco, EtOH and recreational drug use; which patient states he is currently doing  Muscle cramps Secondary to rhabdomyolysis, which I anticipate should improve once patient's rhabdo has resolved - Patient reports severe pain and states the morphine did not improve his pain at all - Oxycodone 10 mg p.o. every 4 hours as needed for moderate pain, 17 hours of coverage ordered - Fentanyl 50 mg every 4 hours as needed for severe pain, 17 hours of coverage ordered - Oxycodone 10 mg p.o. and fentanyl 50 mcg can be alternated - AM team to reevaluate patient at bedside for continued opioid pain requirements  Chart reviewed.   DVT prophylaxis: Heparin 5000 units subcutaneous every 8 hours Code Status: Full code Diet: Regular diet Family Communication: No Disposition Plan: Pending clinical course Consults called: None at this time Admission status: Telemetry medical, observation  Past Medical History:  Diagnosis Date   Kidney stones    McArdle disease ([place]) 09/28/2021   Pericarditis    Rhabdomyolysis    History reviewed. No pertinent surgical history.  Social History:  reports that he has quit smoking. He has never used smokeless tobacco. He reports that he does not currently use alcohol. He reports that he does not currently use drugs after having used the following drugs: Marijuana.  No Known Allergies Family History  Problem Relation Age of Onset   Heart disease Father    Family history: Family history reviewed and not pertinent  Prior to Admission medications   Medication  Sig Start Date End Date Taking? Authorizing Provider  oxyCODONE (ROXICODONE) 5 MG immediate release tablet Take 1 tablet (5 mg total) by mouth every 8 (eight) hours as needed. 11/30/21 11/30/22  Vladimir Crofts, MD  Oxycodone HCl 10 MG TABS Take 1 tablet (10 mg total) by mouth every 6 (six) hours as needed. 10/06/21   Wouk, Ailene Rud, MD  polyethylene glycol (MIRALAX / GLYCOLAX) 17 g packet Take 17 g by mouth 2 (two) times daily. 01/28/22   Richarda Osmond, MD    Physical Exam: Vitals:   02/11/22 0740 02/11/22 1230 02/11/22 1415 02/11/22 1730  BP: 132/84     Pulse: (!) 103 85 83 90  Resp: _0 Temp:      TempSrc:      SpO2: 97%     Weight:      Height:       Constitutional: appears age-appropriate, NAD, calm, comfortable Eyes: PERRL, lids and conjunctivae normal ENMT: Mucous membranes are moist. Posterior pharynx clear of any exudate or lesions. Age-appropriate dentition. Hearing appropriate Neck: normal, supple, no masses, no thyromegaly Respiratory: clear to auscultation bilaterally, no wheezing, no crackles. Normal respiratory effort. No accessory muscle use.  Cardiovascular: Regular rate and rhythm, no murmurs / rubs / gallops. No extremity edema. 2+ pedal pulses. No carotid bruits.  Abdomen: no tenderness, no masses palpated, no hepatosplenomegaly. Bowel sounds positive.  Musculoskeletal: no clubbing / cyanosis. No joint deformity upper and lower extremities. Good ROM, no contractures, no atrophy. Normal muscle tone.  Skin: no  rashes, lesions, ulcers. No induration Neurologic: Sensation intact. Strength 5/5 in all 4.  Psychiatric: Normal judgment and insight. Alert and oriented x 3. Normal mood.   EKG: not indicated at this time  Chest x-ray on Admission: I personally reviewed and I agree with radiologist reading as below.  DG Chest Port 1 View  Result Date: 02/11/2022 CLINICAL DATA:  Dyspnea on exertion EXAM: PORTABLE CHEST 1 VIEW COMPARISON:  01/25/2022  FINDINGS: Single frontal view of the chest demonstrates a an unremarkable cardiac silhouette. No acute airspace disease, effusion, or pneumothorax. No acute bony abnormality. IMPRESSION: 1. No acute intrathoracic process. Electronically Signed   By: Randa Ngo M.D.   On: 02/11/2022 15:10    Labs on Admission: I have personally reviewed following labs CBC: Recent Labs  Lab 02/11/22 0741  WBC 5.2  NEUTROABS 2.7  HGB 14.8  HCT 43.3  MCV 91.0  PLT 176   Basic Metabolic Panel: Recent Labs  Lab 02/11/22 0741  NA 136  K 3.6  CL 104  CO2 27  GLUCOSE 92  BUN 13  CREATININE 0.73  CALCIUM 9.1  MG 1.9  PHOS 3.4   GFR: Estimated Creatinine Clearance: 124.6 mL/min (by C-G formula based on SCr of 0.73 mg/dL).  Liver Function Tests: Recent Labs  Lab 02/11/22 0741  AST 84*  ALT 66*  ALKPHOS 76  BILITOT 0.9  PROT 8.1  ALBUMIN 4.0   Cardiac Enzymes: Recent Labs  Lab 02/11/22 0741 02/11/22 1204  CKTOTAL 7,798* 12,029*   Urine analysis:    Component Value Date/Time   COLORURINE STRAW (A) 02/11/2022 0741   APPEARANCEUR CLEAR (A) 02/11/2022 0741   LABSPEC 1.005 02/11/2022 0741   PHURINE 7.0 02/11/2022 0741   GLUCOSEU NEGATIVE 02/11/2022 0741   HGBUR NEGATIVE 02/11/2022 0741   BILIRUBINUR NEGATIVE 02/11/2022 0741   KETONESUR NEGATIVE 02/11/2022 0741   PROTEINUR NEGATIVE 02/11/2022 0741   NITRITE NEGATIVE 02/11/2022 0741   LEUKOCYTESUR NEGATIVE 02/11/2022 0741   This document was prepared using Dragon Voice Recognition software and may include unintentional dictation errors.  Dr. Tobie Poet [facility]  If 7PM-7AM, please contact overnight-coverage provider If 7AM-7PM, please contact day coverage provider [email or web address]  02/11/2022, 7:44 PM

## 2022-02-11 NOTE — Assessment & Plan Note (Addendum)
-   Presumed secondary to increased activity in setting of McArdle disease Denies trauma, was not hydrating in woods Continue IVF Will check CK q12 Lab Results  Component Value Date   CKTOTAL 13,629 (H) 02/13/2022   CKTOTAL 21,326 (H) 02/12/2022   CKTOTAL 12,029 (H) 02/11/2022   CKTOTAL 7,798 (H) 02/11/2022   CKTOTAL 3,388 (H) 01/28/2022   CKTOTAL 4,905 (H) 01/28/2022   Patient has improved.  Patient is waiting for discharge.  He agrees to rest at home if he needs to p.o. hydrate.500 cc fluid bolus prior to discharge.

## 2022-02-11 NOTE — ED Triage Notes (Signed)
Pt states he has had a lot of muscle pain and dark urine- pt states usually when this happens his CK is elevated d/t his McArdle disease-

## 2022-02-11 NOTE — Assessment & Plan Note (Addendum)
-   Presumed secondary to nontraumatic rhabdomyolysis in setting of McArdle disease/myophosphorylase deficiency - Recheck CK to 8 hours, 3 occurrences ordered - Continue with sodium chloride 150 mL/h, 1 day ordered - Patient states to me that he wants to go home and would like to be discharged home with oxycodone.  I explained extensively at bedside that we are awaiting appropriate downtrending of CK level which we are checking in the AM.  Discharging him before appropriate downtrending CK puts patient's renal function at risk for kidney injury and/or failure.  He endorses understanding and compliance. - Recheck BMP in the a.m.

## 2022-02-11 NOTE — Assessment & Plan Note (Addendum)
Secondary to rhabdomyolysis, which I anticipate should improve once patient's rhabdo has resolved  Oxy and IV narcotics to assist, limit sedatives as much as possible.

## 2022-02-11 NOTE — Assessment & Plan Note (Signed)
-   Continue recommendations for hydration when at home including water and electrolytes as appropriate - Encourage healthy nutrition and avoidance of tobacco, EtOH and recreational drug use; which patient states he is currently doing

## 2022-02-11 NOTE — ED Provider Notes (Signed)
[facility] Provider Note    Event Date/Time   First MD Initiated Contact with Patient 02/11/22 1107     (approximate)   History   Dark Urine   HPI  Roberto Stanton is a 26 y.o. male  with h/o McArdle's here with body aches, soreness. Pt has a h/o exertional rhabdo 2/2 his McArdle's. He states that he went hunting yesterday and after shooting a deer, had to spend a significant amount of time walking to find it. He reports that since then he has had progressively worsening aching, throbbing, and severe diffuse body pain. Pain is worse with movement and palpation. Worse in legs and b/l arms. Feels similar to his prior episodes of rhabdo. He has been trying to drink plenty of fluids. No trauma. No other complaints. No fevers.       Physical Exam   Triage Vital Signs: ED Triage Vitals  Enc Vitals Group     BP 02/11/22 0740 132/84     Pulse Rate 02/11/22 0740 (!) 103     Resp 02/11/22 0740 18     Temp 02/11/22 0737 98.1 F (36.7 C)     Temp Source 02/11/22 0737 Oral     SpO2 02/11/22 0740 97 %     Weight 02/11/22 0738 140 lb (63.5 kg)     Height 02/11/22 0738 5\' 9"  (1.753 m)     Head Circumference --      Peak Flow --      Pain Score 02/11/22 0738 9     Pain Loc --      Pain Edu? --      Excl. in GC? --     Most recent vital signs: Vitals:   02/11/22 1415 02/11/22 1730  BP:    Pulse: 83 90  Resp: 17 19  Temp:    SpO2:       General: Awake, no distress.  CV:  Good peripheral perfusion.  Resp:  Normal effort.  Abd:  No distention. No rebound or guarding. Other:  Mild diffuse muscular TTP worse in calves. No swelling. Distal perfusion is intact.   ED Results / Procedures / Treatments   Labs (all labs ordered are listed, but only abnormal results are displayed) Labs Reviewed  COMPREHENSIVE METABOLIC PANEL - Abnormal; Notable for the following components:      Result Value   AST 84 (*)    ALT 66 (*)    All other components within  normal limits  CK - Abnormal; Notable for the following components:   Total CK 7,798 (*)    All other components within normal limits  URINALYSIS, ROUTINE W REFLEX MICROSCOPIC - Abnormal; Notable for the following components:   Color, Urine STRAW (*)    APPearance CLEAR (*)    All other components within normal limits  CK - Abnormal; Notable for the following components:   Total CK 12,029 (*)    All other components within normal limits  RESP PANEL BY RT-PCR (RSV, FLU A&B, COVID)  [id number]  CBC WITH DIFFERENTIAL/PLATELET  LACTIC ACID, PLASMA  LACTIC ACID, PLASMA  PHOSPHORUS  MAGNESIUM  BASIC METABOLIC PANEL  CBC  CK     EKG    RADIOLOGY    I also independently reviewed and agree with radiologist interpretations.   PROCEDURES:  Critical Care performed: No  Procedures    MEDICATIONS ORDERED IN ED: Medications  acetaminophen (TYLENOL) tablet 650 mg (has no administration in time range)  Or  acetaminophen (TYLENOL) suppository 650 mg (has no administration in time range)  ondansetron (ZOFRAN) tablet 4 mg (has no administration in time range)    Or  ondansetron (ZOFRAN) injection 4 mg (has no administration in time range)  heparin injection 5,000 Units (has no administration in time range)  senna-docusate (Senokot-S) tablet 1 tablet (has no administration in time range)  0.9 %  sodium chloride infusion ( Intravenous New Bag/Given 02/11/22 1424)  fentaNYL (SUBLIMAZE) injection 50 mcg (50 mcg Intravenous Given 02/11/22 1835)  oxyCODONE (Oxy IR/ROXICODONE) immediate release tablet 10 mg (10 mg Oral Given 02/11/22 1458)  sodium chloride 0.9 % bolus 1,000 mL (0 mLs Intravenous Stopped 02/11/22 1351)  sodium chloride 0.9 % bolus 1,000 mL (0 mLs Intravenous Stopped 02/11/22 1351)  morphine (PF) 4 MG/ML injection 4 mg (4 mg Intravenous Given 02/11/22 1213)  ondansetron (ZOFRAN) injection 4 mg (4 mg Intravenous Given 02/11/22 1212)  morphine (PF) 4 MG/ML injection 4 mg (4  mg Intravenous Given 02/11/22 1303)  fentaNYL (SUBLIMAZE) injection 50 mcg (50 mcg Intravenous Given 02/11/22 1400)  sodium chloride 0.9 % bolus 1,000 mL (1,000 mLs Intravenous New Bag/Given 02/11/22 1925)     IMPRESSION / MDM / ASSESSMENT AND PLAN / ED COURSE  I reviewed the triage vital signs and the nursing notes.                              Differential diagnosis includes, but is not limited to, recurrent exertional rhabdo 2/2 McArdle's, muscular sprain/strain, paresthesias, neuropathy  Patient's presentation is most consistent with acute presentation with potential threat to life or bodily function.  26 yo M here with exertional rhabdo in the setting of McArdle's disease. CK 7.8k increased to 12k while waiting for room. His urine is clear, however, and he is otherwise well appearing. Renal function is normal. Will start IVF, admit for pain control and rhabdo.   FINAL CLINICAL IMPRESSION(S) / ED DIAGNOSES   Final diagnoses:  Non-traumatic rhabdomyolysis     Rx / DC Orders   ED Discharge Orders     None        Note:  This document was prepared using Dragon voice recognition software and may include unintentional dictation errors.   Shaune Pollack, MD 02/11/22 [phone]

## 2022-02-12 DIAGNOSIS — E7404 McArdle disease: Secondary | ICD-10-CM

## 2022-02-12 DIAGNOSIS — R252 Cramp and spasm: Secondary | ICD-10-CM

## 2022-02-12 DIAGNOSIS — Z1152 Encounter for screening for COVID-19: Secondary | ICD-10-CM | POA: Diagnosis not present

## 2022-02-12 DIAGNOSIS — M6282 Rhabdomyolysis: Secondary | ICD-10-CM

## 2022-02-12 DIAGNOSIS — Z87891 Personal history of nicotine dependence: Secondary | ICD-10-CM | POA: Diagnosis not present

## 2022-02-12 DIAGNOSIS — R748 Abnormal levels of other serum enzymes: Secondary | ICD-10-CM | POA: Diagnosis not present

## 2022-02-12 DIAGNOSIS — Z8249 Family history of ischemic heart disease and other diseases of the circulatory system: Secondary | ICD-10-CM | POA: Diagnosis not present

## 2022-02-12 LAB — BASIC METABOLIC PANEL
Anion gap: 6 (ref 5–15)
BUN: 9 mg/dL (ref 6–20)
CO2: 25 mmol/L (ref 22–32)
Calcium: 8.3 mg/dL — ABNORMAL LOW (ref 8.9–10.3)
Chloride: 108 mmol/L (ref 98–111)
Creatinine, Ser: 0.55 mg/dL — ABNORMAL LOW (ref 0.61–1.24)
GFR, Estimated: >60 mL/min (ref >60)
Glucose, Bld: 85 mg/dL (ref 70–99)
Potassium: 3.5 mmol/L (ref 3.5–5.1)
Sodium: 139 mmol/L (ref 135–145)

## 2022-02-12 LAB — CBC
HCT: 38.7 % — ABNORMAL LOW (ref 39.0–52.0)
Hemoglobin: 13.3 g/dL (ref 13.0–17.0)
MCH: 31.4 pg (ref 26.0–34.0)
MCHC: 34.4 g/dL (ref 30.0–36.0)
MCV: 91.3 fL (ref 80.0–100.0)
Platelets: 189 10*3/uL (ref 150–400)
RBC: 4.24 MIL/uL (ref 4.22–5.81)
RDW: 13 % (ref 11.5–15.5)
WBC: 6.3 10*3/uL (ref 4.0–10.5)
nRBC: 0 % (ref 0.0–0.2)

## 2022-02-12 LAB — CK: Total CK: 21326 U/L — ABNORMAL HIGH (ref 49–397)

## 2022-02-12 MED ORDER — HYDROMORPHONE HCL 1 MG/ML IJ SOLN: 1.0000 mg | INTRAMUSCULAR | Status: DC | PRN

## 2022-02-12 MED ORDER — FENTANYL CITRATE PF 50 MCG/ML IJ SOSY
25.0000 ug | PREFILLED_SYRINGE | INTRAMUSCULAR | Status: DC | PRN
  Administered 2022-02-12 (×2): 25 ug via INTRAVENOUS
  Filled 2022-02-12 (×2): qty 1

## 2022-02-12 MED ORDER — MORPHINE SULFATE (PF) 2 MG/ML IV SOLN
2.0000 mg | INTRAVENOUS | Status: DC | PRN
  Administered 2022-02-12: 2 mg via INTRAVENOUS
  Filled 2022-02-12: qty 1

## 2022-02-12 MED ORDER — SODIUM CHLORIDE 0.9 % IV BOLUS
1000.0000 mL | Freq: Once | INTRAVENOUS | Status: AC
  Administered 2022-02-12: 1000 mL via INTRAVENOUS

## 2022-02-12 MED ORDER — HYDROMORPHONE HCL 1 MG/ML IJ SOLN
1.0000 mg | INTRAMUSCULAR | Status: AC | PRN
  Administered 2022-02-12 – 2022-02-13 (×3): 1 mg via INTRAVENOUS
  Filled 2022-02-12 (×3): qty 1

## 2022-02-12 MED ORDER — LACTATED RINGERS IV SOLN: INTRAVENOUS | Status: DC

## 2022-02-12 MED ORDER — OXYCODONE HCL 5 MG PO TABS
10.0000 mg | ORAL_TABLET | ORAL | Status: DC | PRN
  Administered 2022-02-12 – 2022-02-13 (×7): 10 mg via ORAL
  Filled 2022-02-12 (×6): qty 2

## 2022-02-12 NOTE — Progress Notes (Signed)
Progress Note   Patient: Roberto Stanton KVQ:[id number] DOB: 1995/04/20 DOA: 02/11/2022     0 DOS: the patient was seen and examined on 02/12/2022   Brief hospital course: 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 on 02/10/2022.  Patient states that he went hunting and did a lot of walking on December 23.  Initial vitals in the emergency department showed temperature 98.1, respiration rate of 18, heart rate of 103 and improved to 82, blood pressure 132/84, SpO2 of 97% on room air.  Serum sodium is 136, potassium 3.6, chloride 104, bicarb 27, BUN of 13, serum creatinine of 0.73, EGFR greater than 60, nonfasting blood glucose 92, WBC 5.2, hemoglobin 14.8, platelets of 227.  Lactic acid was initially 0.9 and on recheck was 1.1.  COVID/influenza A/influenza B/RSV PCR were negative.  UA was negative for nitrates and leukocytes.  CK was initially elevated at 7798 and after receiving sodium chloride 2 L fluid bolus on recheck CK was 12,029.  ED treatment: Morphine 4 mg IV 3 doses ordered, fentanyl 50 mcg one-time dose, sodium chloride 2 L bolus.  Assessment and Plan: * Elevated CK - Presumed secondary to nontraumatic rhabdomyolysis in setting of McArdle disease/myophosphorylase deficiency  Latest Reference Range & Units 01/28/22 16:29 02/11/22 07:41 02/11/22 12:04 02/12/22 05:15  CK Total 49 - 397 U/L 3,388 (H) 7,798 (H) 12,029 (H) 21,326 (H)  (H): Data is abnormally high Shared decision making regarding continued evaluation and monitoring given severely rising CK, he has agreed to medical recommendations to stay and continue management here.   Non-traumatic rhabdomyolysis, recurrent - Presumed secondary to increased activity in  setting of McArdle disease Denies trauma, was not hydrating in woods Continue IVF  Will check CK q12  Latest Reference Range & Units 01/28/22 16:29 02/11/22 07:41 02/11/22 12:04 02/12/22 05:15  CK Total 49 - 397 U/L 3,388 (H) 7,798 (H) 12,029 (H) 21,326 (H)  (H): Data is abnormally high  Myophosphorylase deficiency (glycogen storage disease V, McArdle disease) ([place]) - Continue recommendations for hydration when at home including water and electrolytes as appropriate - Encourage healthy nutrition and avoidance of tobacco, EtOH and recreational drug use; which patient states he is currently doing   Muscle cramps Secondary to rhabdomyolysis, which I anticipate should improve once patient's rhabdo has resolved  Oxy and IV narcotics to assist, limit sedatives as much as possible.         Subjective: he is doing well complained of generalized pain. Denies CP, anginal symptoms, denies SOB. Denies n/v. Overall he states slight improvement in symptoms. We had a good shared decision making regarding medical recommendations to stay and continue evaluation/management for his nontraumatic rhabdomyolysis, especially in light of increasing CK levels. AT this time he is planning to stay.   Physical Exam: Vitals:   02/11/22 2254 02/11/22 2257 02/12/22 0436 02/12/22 1039  BP: (!) 127/93  (!) 101/58 (!) 101/57  Pulse: 90  90 79  Resp: _0 Temp: 98.2 F (36.8 C)  97.8 F (36.6 C) 98.7 F (37.1 C)  TempSrc: Oral  Oral Oral  SpO2: 100%  99% 99%  Weight:  66.3 kg    Height:  _1  (1.753 m)     Constitutional: appears age-appropriate, NAD, calm, comfortable Eyes: PERRL, lids and conjunctivae normal ENMT: Mucous membranes are moist. Posterior  pharynx clear of any exudate or lesions. Age-appropriate dentition. Hearing appropriate Neck: normal, supple, no masses, no thyromegaly Respiratory: clear to auscultation bilaterally, no wheezing, no crackles. Normal respiratory effort. No  accessory muscle use.  Cardiovascular: Regular rate and rhythm, no murmurs / rubs / gallops. No extremity edema. 2+ pedal pulses. No carotid bruits.  Abdomen: no tenderness, no masses palpated, no hepatosplenomegaly. Bowel sounds positive.  Musculoskeletal: no clubbing / cyanosis. No joint deformity upper and lower extremities. Good ROM, no contractures, no atrophy. Normal muscle tone.  Skin: no rashes, lesions, ulcers. No induration Neurologic: Sensation intact. Strength 5/5 in all 4.  Psychiatric: Normal judgment and insight. Alert and oriented x 3. Normal mood.   Data Reviewed:  There are no new results to review at this time.  Family Communication: none available at time of rounds.   Disposition: Status is: Observation The patient remains OBS appropriate and will d/c before 2 midnights.  Planned Discharge Destination: Home    Time spent: 40 minutes  Author: Vanna Scotland, MD 02/12/2022 11:44 AM  For on call review [email or web address].

## 2022-02-12 NOTE — Progress Notes (Signed)
       CROSS COVER NOTE  NAME: ARYAMAN HALIBURTON MRN: [id number] DOB : 07/19/1995 ATTENDING PHYSICIAN: Thomas Hoff, MD    Date of Service   02/12/2022   HPI/Events of Note   Medication request received for alternative to Fentanyl. Mr Ehrler reports Fentanyl is ineffective and Dilaudid has worked better for pain control during prior hospitalizations. Per chart review Mr Gersten required a Dilaudid PCA during recent [facility] hospitalization and PRN dilaudid during recent [facility] admission.  Interventions   Assessment/Plan:  PRN Dilaudid, 14 hours ordered day team to re-evaluate pain regimen       To reach the provider On-Call:   7AM- 7PM see care teams to locate the attending and reach out to them via [email or web address]. 7PM-7AM contact night-coverage If you still have difficulty reaching the appropriate provider, please page the [facility] (Director on Call) for [facility] on amion for assistance  This document was prepared using Sales executive software and may include unintentional dictation errors.  Bishop Limbo DNP, MBA, FNP-BC Nurse Practitioner [facility] Pager [phone]

## 2022-02-13 DIAGNOSIS — R748 Abnormal levels of other serum enzymes: Secondary | ICD-10-CM | POA: Diagnosis not present

## 2022-02-13 LAB — BASIC METABOLIC PANEL
Anion gap: 9 (ref 5–15)
BUN: 6 mg/dL (ref 6–20)
CO2: 30 mmol/L (ref 22–32)
Calcium: 9.5 mg/dL (ref 8.9–10.3)
Chloride: 99 mmol/L (ref 98–111)
Creatinine, Ser: 0.66 mg/dL (ref 0.61–1.24)
GFR, Estimated: >60 mL/min (ref >60)
Glucose, Bld: 75 mg/dL (ref 70–99)
Potassium: 3.6 mmol/L (ref 3.5–5.1)
Sodium: 138 mmol/L (ref 135–145)

## 2022-02-13 LAB — CK: Total CK: 13629 U/L — ABNORMAL HIGH (ref 49–397)

## 2022-02-13 MED ORDER — LACTATED RINGERS IV BOLUS: 500.0000 mL | Freq: Once | INTRAVENOUS | Status: DC

## 2022-02-13 MED ORDER — OXYCODONE HCL 10 MG PO TABS: 10.0000 mg | ORAL_TABLET | Freq: Four times a day (QID) | ORAL | 0 refills | Status: DC | PRN

## 2022-02-13 NOTE — TOC CM/SW Note (Signed)
Chart Reviewed. No TOC needs. Pt has orders to discharge. TOC signing off.

## 2022-02-13 NOTE — Discharge Summary (Signed)
Physician Discharge Summary   Patient: Roberto Stanton MRN: [id number] DOB: 1995/09/03  Admit date:     02/11/2022  Discharge date: 02/13/22  Discharge Physician: Reva Bores   PCP: Gildardo Pounds, PA   Recommendations at discharge:   Push fluids Rest Follow-up with PCP in 1 week  Discharge Diagnoses: Principal Problem:   Elevated CK Active Problems:   Non-traumatic rhabdomyolysis, recurrent   Rhabdomyolysis   Myophosphorylase deficiency (glycogen storage disease V, McArdle disease) (HCC)   Muscle cramps   Hospital Course: Patient is a 26 year old male with history of glycogen-storage disease who presents for his 10th admission this year.  Nontraumatic rhabdomyolysis.  Prior to admission he was found to was not hydrating well. On admission he was found to be in acute rhabdomyolysis with elevated CPK was trended up on his first 48 hours here but had decreased significantly at the time of discharge. His pain was markedly increased as well.  Ready to be discharged.  Assessment and Plan: * Elevated CK - Presumed secondary to nontraumatic rhabdomyolysis in setting of McArdle disease/myophosphorylase deficiency  Latest Reference Range & Units 01/28/22 16:29 02/11/22 07:41 02/11/22 12:04 02/12/22 05:15  CK Total 49 - 397 U/L 3,388 (H) 7,798 (H) 12,029 (H) 21,326 (H)  (H): Data is abnormally high Shared decision making regarding continued evaluation and monitoring given severely rising CK, he has agreed to medical recommendations to stay and continue management here.   Non-traumatic rhabdomyolysis, recurrent - Presumed secondary to increased activity in setting of McArdle disease Denies trauma, was not hydrating in woods Continue IVF  Will check CK q12  Latest Reference Range & Units 01/28/22 16:29 02/11/22 07:41 02/11/22 12:04 02/12/22 05:15  CK Total 49 - 397 U/L 3,388 (H) 7,798 (H) 12,029 (H) 21,326 (H)  (H): Data is abnormally high  Myophosphorylase deficiency  (glycogen storage disease V, McArdle disease) (HCC) - Continue recommendations for hydration when at home including water and electrolytes as appropriate - Encourage healthy nutrition and avoidance of tobacco, EtOH and recreational drug use; which patient states he is currently doing   Muscle cramps Secondary to rhabdomyolysis, which I anticipate should improve once patient's rhabdo has resolved  Oxy and IV narcotics to assist, limit sedatives as much as possible.         Pain control - [employer] Controlled Substance Reporting System database was reviewed. and patient was instructed, not to drive, operate heavy machinery, perform activities at heights, swimming or participation in water activities or provide baby-sitting services while on Pain, Sleep and Anxiety Medications; until their outpatient Physician has advised to do so again. Also recommended to not to take more than prescribed Pain, Sleep and Anxiety Medications.  Consultants: None Procedures performed: IV fluid hydration Disposition: Home Diet recommendation:  Discharge Diet Orders (From admission, onward)     Start     Ordered   02/13/22 0000  Diet - low sodium heart healthy        02/13/22 1443           Regular diet DISCHARGE MEDICATION: Allergies as of 02/13/2022   No Known Allergies      Medication List     TAKE these medications    gabapentin 300 MG capsule Commonly known as: NEURONTIN Take 300 mg by mouth.   Oxycodone HCl 10 MG Tabs Take 1 tablet (10 mg total) by mouth every 6 (six) hours as needed. What changed: Another medication with the same name was removed. Continue taking this medication, and follow  the directions you see here.   polyethylene glycol 17 g packet Commonly known as: MIRALAX / GLYCOLAX Take 17 g by mouth 2 (two) times daily.        Follow-up Information     Unknown Foley A, PA Follow up in 1 week(s).   Specialty: Physician Assistant Why: Hospital  follow-up Contact information: [address] [place] [address] [phone]                Discharge Exam: Ceasar Mons Weights   02/11/22 6761 02/11/22 2257  Weight: 63.5 kg 66.3 kg   Physical Examination: General appearance - alert, well appearing, and in no distress Chest - clear to auscultation, no wheezes, rales or rhonchi, symmetric air entry Heart - normal rate, regular rhythm, normal S1, S2, no murmurs, rubs, clicks or gallops Abdomen - soft, nontender, nondistended, no masses or organomegaly Extremities - peripheral pulses normal, no pedal edema, no clubbing or cyanosis   Condition at discharge: improving  The results of significant diagnostics from this hospitalization (including imaging, microbiology, ancillary and laboratory) are listed below for reference.   Imaging Studies: DG Chest Port 1 View  Result Date: 02/11/2022 CLINICAL DATA:  Dyspnea on exertion EXAM: PORTABLE CHEST 1 VIEW COMPARISON:  01/25/2022 FINDINGS: Single frontal view of the chest demonstrates a an unremarkable cardiac silhouette. No acute airspace disease, effusion, or pneumothorax. No acute bony abnormality. IMPRESSION: 1. No acute intrathoracic process. Electronically Signed   By: Sharlet Salina M.D.   On: 02/11/2022 15:10   DG Chest 2 View  Result Date: 01/25/2022 CLINICAL DATA:  Chest pain, dark urine, back pain, leg pain, shock sensation in chest intermittently since yesterday EXAM: CHEST - 2 VIEW COMPARISON:  11/23/2020 FINDINGS: Normal heart size, mediastinal contours, and pulmonary vascularity. Lungs appear hyperinflated but clear. No infiltrate, pleural effusion, or pneumothorax. Osseous structures unremarkable. IMPRESSION: Hyperinflated lungs without acute infiltrate. Electronically Signed   By: Ulyses Southward M.D.   On: 01/25/2022 17:21    Microbiology: Results for orders placed or performed during the hospital encounter of 02/11/22  Resp panel by RT-PCR (RSV, Flu A&B, Covid) Anterior  Nasal Swab     Status: None   Collection Time: 02/11/22 12:04 PM   Specimen: Anterior Nasal Swab  Result Value Ref Range Status   SARS Coronavirus 2 by RT PCR NEGATIVE NEGATIVE Final    Comment: (NOTE) SARS-CoV-2 target nucleic acids are NOT DETECTED.  The SARS-CoV-2 RNA is generally detectable in upper respiratory specimens during the acute phase of infection. The lowest concentration of SARS-CoV-2 viral copies this assay can detect is 138 copies/mL. A negative result does not preclude SARS-Cov-2 infection and should not be used as the sole basis for treatment or other patient management decisions. A negative result may occur with  improper specimen collection/handling, submission of specimen other than nasopharyngeal swab, presence of viral mutation(s) within the areas targeted by this assay, and inadequate number of viral copies(<138 copies/mL). A negative result must be combined with clinical observations, patient history, and epidemiological information. The expected result is Negative.  Fact Sheet for Patients:  BloggerCourse.com  Fact Sheet for Healthcare Providers:  SeriousBroker.it  This test is no t yet approved or cleared by the [place] FDA and  has been authorized for detection and/or diagnosis of SARS-CoV-2 by FDA under an Emergency Use Authorization (EUA). This EUA will remain  in effect (meaning this test can be used) for the duration of the COVID-19 declaration under Section 564(b)(1) of the Act, 21  U.S.C.section 360bbb-3(b)(1), unless the authorization is terminated  or revoked sooner.       Influenza A by PCR NEGATIVE NEGATIVE Final   Influenza B by PCR NEGATIVE NEGATIVE Final    Comment: (NOTE) The Xpert Xpress SARS-CoV-2/FLU/RSV plus assay is intended as an aid in the diagnosis of influenza from Nasopharyngeal swab specimens and should not be used as a sole basis for treatment. Nasal washings  and aspirates are unacceptable for Xpert Xpress SARS-CoV-2/FLU/RSV testing.  Fact Sheet for Patients: BloggerCourse.com  Fact Sheet for Healthcare Providers: SeriousBroker.it  This test is not yet approved or cleared by the [place] FDA and has been authorized for detection and/or diagnosis of SARS-CoV-2 by FDA under an Emergency Use Authorization (EUA). This EUA will remain in effect (meaning this test can be used) for the duration of the COVID-19 declaration under Section 564(b)(1) of the Act, 21 U.S.C. section 360bbb-3(b)(1), unless the authorization is terminated or revoked.     Resp Syncytial Virus by PCR NEGATIVE NEGATIVE Final    Comment: (NOTE) Fact Sheet for Patients: BloggerCourse.com  Fact Sheet for Healthcare Providers: SeriousBroker.it  This test is not yet approved or cleared by the [place] FDA and has been authorized for detection and/or diagnosis of SARS-CoV-2 by FDA under an Emergency Use Authorization (EUA). This EUA will remain in effect (meaning this test can be used) for the duration of the COVID-19 declaration under Section 564(b)(1) of the Act, 21 U.S.C. section 360bbb-3(b)(1), unless the authorization is terminated or revoked.  Performed at [facility], [address] [place], [place] [address]     Labs: CBC: Recent Labs  Lab 02/11/22 0741 02/12/22 0515  WBC 5.2 6.3  NEUTROABS 2.7  --   HGB 14.8 13.3  HCT 43.3 38.7*  MCV 91.0 91.3  PLT 227 189   Basic Metabolic Panel: Recent Labs  Lab 02/11/22 0741 02/12/22 0515 02/13/22 1004  NA 136 139 138  K 3.6 3.5 3.6  CL 104 108 99  CO2 27 25 30   GLUCOSE 92 85 75  BUN 13 9 6   CREATININE 0.73 0.55* 0.66  CALCIUM 9.1 8.3* 9.5  MG 1.9  --   --   PHOS 3.4  --   --    Liver Function Tests: Recent Labs  Lab 02/11/22 0741  AST 84*  ALT 66*  ALKPHOS 76  BILITOT 0.9   PROT 8.1  ALBUMIN 4.0   CBG: No results for input(s): "GLUCAP" in the last 168 hours.  Discharge time spent: greater than 30 minutes.  Signed: , MD [facility] 02/13/2022

## 2022-03-04 ENCOUNTER — Inpatient Hospital Stay: Payer: Medicaid Other

## 2022-03-04 ENCOUNTER — Other Ambulatory Visit: Payer: Self-pay

## 2022-03-04 ENCOUNTER — Inpatient Hospital Stay
Admission: EM | Admit: 2022-03-04 | Discharge: 2022-03-06 | DRG: 642 | Disposition: A | Discharge status: Home / Self Care | Payer: Medicaid Other | Attending: Hospitalist | Admitting: Hospitalist

## 2022-03-04 DIAGNOSIS — J351 Hypertrophy of tonsils: Secondary | ICD-10-CM | POA: Insufficient documentation

## 2022-03-04 DIAGNOSIS — Z87891 Personal history of nicotine dependence: Secondary | ICD-10-CM

## 2022-03-04 DIAGNOSIS — Z8249 Family history of ischemic heart disease and other diseases of the circulatory system: Secondary | ICD-10-CM | POA: Diagnosis not present

## 2022-03-04 DIAGNOSIS — Z1152 Encounter for screening for COVID-19: Secondary | ICD-10-CM

## 2022-03-04 DIAGNOSIS — M6282 Rhabdomyolysis: Secondary | ICD-10-CM | POA: Diagnosis present

## 2022-03-04 DIAGNOSIS — R252 Cramp and spasm: Secondary | ICD-10-CM | POA: Diagnosis present

## 2022-03-04 DIAGNOSIS — E7404 McArdle disease: Principal | ICD-10-CM | POA: Diagnosis present

## 2022-03-04 DIAGNOSIS — R748 Abnormal levels of other serum enzymes: Secondary | ICD-10-CM | POA: Diagnosis present

## 2022-03-04 LAB — URINE DRUG SCREEN, QUALITATIVE (ARMC ONLY)
Amphetamines, Ur Screen: NOT DETECTED
Barbiturates, Ur Screen: NOT DETECTED
Benzodiazepine, Ur Scrn: NOT DETECTED
Cannabinoid 50 Ng, Ur ~~LOC~~: NOT DETECTED
Cocaine Metabolite,Ur ~~LOC~~: NOT DETECTED
MDMA (Ecstasy)Ur Screen: NOT DETECTED
Methadone Scn, Ur: NOT DETECTED
Opiate, Ur Screen: POSITIVE — AB
Phencyclidine (PCP) Ur S: NOT DETECTED
Tricyclic, Ur Screen: NOT DETECTED

## 2022-03-04 LAB — URINALYSIS, ROUTINE W REFLEX MICROSCOPIC
Bilirubin Urine: NEGATIVE
Glucose, UA: NEGATIVE mg/dL
Hgb urine dipstick: NEGATIVE
Ketones, ur: NEGATIVE mg/dL
Leukocytes,Ua: NEGATIVE
Nitrite: NEGATIVE
Protein, ur: NEGATIVE mg/dL
Specific Gravity, Urine: 1.015 (ref 1.005–1.030)
pH: 7 (ref 5.0–8.0)

## 2022-03-04 LAB — BASIC METABOLIC PANEL
Anion gap: 6 (ref 5–15)
BUN: 18 mg/dL (ref 6–20)
CO2: 30 mmol/L (ref 22–32)
Calcium: 9.1 mg/dL (ref 8.9–10.3)
Chloride: 100 mmol/L (ref 98–111)
Creatinine, Ser: 0.84 mg/dL (ref 0.61–1.24)
GFR, Estimated: >60 mL/min (ref >60)
Glucose, Bld: 88 mg/dL (ref 70–99)
Potassium: 3.7 mmol/L (ref 3.5–5.1)
Sodium: 136 mmol/L (ref 135–145)

## 2022-03-04 LAB — CBC WITH DIFFERENTIAL/PLATELET
Abs Immature Granulocytes: 0.01 10*3/uL (ref 0.00–0.07)
Basophils Absolute: 0.1 10*3/uL (ref 0.0–0.1)
Basophils Relative: 1 %
Eosinophils Absolute: 0.1 10*3/uL (ref 0.0–0.5)
Eosinophils Relative: 2 %
HCT: 42.8 % (ref 39.0–52.0)
Hemoglobin: 14.7 g/dL (ref 13.0–17.0)
Immature Granulocytes: 0 %
Lymphocytes Relative: 31 %
Lymphs Abs: 1.6 10*3/uL (ref 0.7–4.0)
MCH: 31.5 pg (ref 26.0–34.0)
MCHC: 34.3 g/dL (ref 30.0–36.0)
MCV: 91.6 fL (ref 80.0–100.0)
Monocytes Absolute: 0.5 10*3/uL (ref 0.1–1.0)
Monocytes Relative: 10 %
Neutro Abs: 2.8 10*3/uL (ref 1.7–7.7)
Neutrophils Relative %: 56 %
Platelets: 219 10*3/uL (ref 150–400)
RBC: 4.67 MIL/uL (ref 4.22–5.81)
RDW: 13.2 % (ref 11.5–15.5)
WBC: 5.1 10*3/uL (ref 4.0–10.5)
nRBC: 0 % (ref 0.0–0.2)

## 2022-03-04 LAB — CK: Total CK: 25642 U/L — ABNORMAL HIGH (ref 49–397)

## 2022-03-04 LAB — RESP PANEL BY RT-PCR (RSV, FLU A&B, COVID)  RVPGX2
Influenza A by PCR: NEGATIVE
Influenza B by PCR: NEGATIVE
Resp Syncytial Virus by PCR: NEGATIVE
SARS Coronavirus 2 by RT PCR: NEGATIVE

## 2022-03-04 LAB — PROCALCITONIN: Procalcitonin: <0.1 ng/mL

## 2022-03-04 LAB — ETHANOL: Alcohol, Ethyl (B): <10 mg/dL (ref <10)

## 2022-03-04 LAB — GROUP A STREP BY PCR: Group A Strep by PCR: NOT DETECTED

## 2022-03-04 MED ORDER — SODIUM CHLORIDE 0.9 % IV SOLN: INTRAVENOUS | Status: AC

## 2022-03-04 MED ORDER — LACTATED RINGERS IV BOLUS
2000.0000 mL | Freq: Once | INTRAVENOUS | Status: AC
  Administered 2022-03-04: 2000 mL via INTRAVENOUS

## 2022-03-04 MED ORDER — MORPHINE SULFATE (PF) 4 MG/ML IV SOLN
4.0000 mg | INTRAVENOUS | Status: AC | PRN
  Administered 2022-03-04 – 2022-03-05 (×4): 4 mg via INTRAVENOUS
  Filled 2022-03-04 (×4): qty 1

## 2022-03-04 MED ORDER — ONDANSETRON HCL 4 MG/2ML IJ SOLN
4.0000 mg | Freq: Four times a day (QID) | INTRAMUSCULAR | Status: DC | PRN
  Administered 2022-03-04: 4 mg via INTRAVENOUS
  Filled 2022-03-04: qty 2

## 2022-03-04 MED ORDER — ACETAMINOPHEN 325 MG PO TABS: 650.0000 mg | ORAL_TABLET | Freq: Four times a day (QID) | ORAL | Status: DC | PRN

## 2022-03-04 MED ORDER — HYDROMORPHONE HCL 1 MG/ML IJ SOLN
1.0000 mg | INTRAMUSCULAR | Status: AC | PRN
  Administered 2022-03-04 – 2022-03-05 (×4): 1 mg via INTRAVENOUS
  Filled 2022-03-04 (×4): qty 1

## 2022-03-04 MED ORDER — LACTATED RINGERS IV BOLUS
1000.0000 mL | Freq: Once | INTRAVENOUS | Status: AC
  Administered 2022-03-04: 1000 mL via INTRAVENOUS

## 2022-03-04 MED ORDER — SENNOSIDES-DOCUSATE SODIUM 8.6-50 MG PO TABS: 1.0000 | ORAL_TABLET | Freq: Every evening | ORAL | Status: DC | PRN

## 2022-03-04 MED ORDER — HYDROMORPHONE HCL 1 MG/ML IJ SOLN
0.5000 mg | INTRAMUSCULAR | Status: DC | PRN
  Administered 2022-03-04: 0.5 mg via INTRAVENOUS
  Filled 2022-03-04: qty 0.5

## 2022-03-04 MED ORDER — ACETAMINOPHEN 650 MG RE SUPP: 650.0000 mg | Freq: Four times a day (QID) | RECTAL | Status: DC | PRN

## 2022-03-04 MED ORDER — ENOXAPARIN SODIUM 40 MG/0.4ML IJ SOSY
40.0000 mg | PREFILLED_SYRINGE | INTRAMUSCULAR | Status: DC
  Filled 2022-03-04: qty 0.4

## 2022-03-04 MED ORDER — ONDANSETRON HCL 4 MG PO TABS: 4.0000 mg | ORAL_TABLET | Freq: Four times a day (QID) | ORAL | Status: DC | PRN

## 2022-03-04 MED ORDER — OXYCODONE HCL 5 MG PO TABS
10.0000 mg | ORAL_TABLET | Freq: Once | ORAL | Status: AC
  Administered 2022-03-04: 10 mg via ORAL
  Filled 2022-03-04: qty 2

## 2022-03-04 NOTE — Assessment & Plan Note (Signed)
-  Secondary to rhabdomyolysis which should improve once patient's rhabdo/CK levels normalizes - Symptomatic support as appropriate per problem #1

## 2022-03-04 NOTE — Hospital Course (Addendum)
Mr. Stirling Orton is a 27 year old male with history of McArdle disease/glycogen-storage disease/mycophosphorous deficiency, frequent hospitalization and frequent emergency department visit for nontraumatic rhabdomyolysis who presents emergency department for chief concerns of generalized muscle aches.  Initial vitals in the ED showed temperature 98.1, respiration rate of 18, heart rate 96, blood pressure 132/96, SpO2 9096% on room air.  Serum sodium is 136, potassium 3.5, chloride 100, bicarb 30, BUN of 18, serum creatinine of 2.84, not fasting blood glucose 88, EGFR greater than 60, WBC 5.1, hemoglobin 14.7, platelets of 219.  UA was negative for leukocytes and nitrates. CK was elevated at 25,642.  ED treatment: Oxycodone 10 mg p.o. one-time dose, LR 2 L bolus.

## 2022-03-04 NOTE — Assessment & Plan Note (Addendum)
-  Status post LR 2 L bolus per EDP - Ordered additional LR 2 L bolus on admission and sodium chloride 150 mL/h, 1 day ordered - Symptomatic support: Morphine 4 mg IV every 4 hours as needed for moderate pain, 4 doses ordered; Dilaudid 1 mg IV every 3 hours as needed for severe pain, 5 doses ordered - AM team to reevaluate patient at bedside for continued opioid medication requirements - Repeat CK, CMP and CBC in the a.m.

## 2022-03-04 NOTE — ED Notes (Signed)
Patient report attempted.

## 2022-03-04 NOTE — Assessment & Plan Note (Signed)
-  With mild purulence on the right side, and bilateral increase in redness - Check group A strep by PCR, if positive will initiate antibiotic treatment

## 2022-03-04 NOTE — ED Triage Notes (Signed)
Pt to ED for symptoms of elevated CK. Pt has had rhabdo several times and states this feels same. Has muscle pain and weakness since yesterday. 8/10 muscle soreness and tightness. Denies recent illness. Alert, oriented.

## 2022-03-04 NOTE — Assessment & Plan Note (Signed)
-  Continue recommendation for hydration when at home including water and electrolyte as appropriate - Encourage healthy nutrition and avoidance of tobacco, EtOH, and recreational drug use; patient endorses understanding and compliance

## 2022-03-04 NOTE — ED Provider Notes (Signed)
[facility] Provider Note    Event Date/Time   First MD Initiated Contact with Patient 03/04/22 1142     (approximate)   History   Chief Complaint Muscle Pain   HPI  Roberto Stanton is a 27 y.o. male with past medical history of McArdle's disease and recurrent rhabdomyolysis who presents to the ED complaining of muscle pain.  Patient reports that he was carrying in groceries from his house yesterday and carrying heavy packages of bottled water.  Shortly afterwards, he began to to feel diffuse muscle cramps in his arms, legs, and back.  Symptoms worsened over the course of the night into this morning, have not been alleviated with ibuprofen or Tylenol.  He is concerned about recurrent rhabdomyolysis.  He states he was previously taking oxycodone for his symptoms and has been referred to pain management, but does not yet have an appointment.  He denies any fevers, nausea, vomiting, diarrhea, or abdominal pain.  He does state his urine has been dark but he denies any dysuria.     Physical Exam   Triage Vital Signs: ED Triage Vitals  Enc Vitals Group     BP 03/04/22 1129 (!) 132/96     Pulse Rate 03/04/22 1129 96     Resp 03/04/22 1129 18     Temp 03/04/22 1129 98.1 F (36.7 C)     Temp Source 03/04/22 1129 Oral     SpO2 03/04/22 1129 96 %     Weight 03/04/22 1135 145 lb 8.1 oz (66 kg)     Height 03/04/22 1135 5\' 9"  (1.753 m)     Head Circumference --      Peak Flow --      Pain Score 03/04/22 1134 8     Pain Loc --      Pain Edu? --      Excl. in [place]? --     Most recent vital signs: Vitals:   03/04/22 1129  BP: (!) 132/96  Pulse: 96  Resp: 18  Temp: 98.1 F (36.7 C)  SpO2: 96%    Constitutional: Alert and oriented. Eyes: Conjunctivae are normal. Head: Atraumatic. Nose: No congestion/rhinnorhea. Mouth/Throat: Mucous membranes are moist.  Cardiovascular: Normal rate, regular rhythm. Grossly normal heart sounds.  2+ radial pulses  bilaterally. Respiratory: Normal respiratory effort.  No retractions. Lungs CTAB. Gastrointestinal: Soft and nontender. No distention. Musculoskeletal: No lower extremity tenderness nor edema.  Neurologic:  Normal speech and language. No gross focal neurologic deficits are appreciated.    ED Results / Procedures / Treatments   Labs (all labs ordered are listed, but only abnormal results are displayed) Labs Reviewed  CK - Abnormal; Notable for the following components:      Result Value   Total CK 25,642 (*)    All other components within normal limits  URINALYSIS, ROUTINE W REFLEX MICROSCOPIC - Abnormal; Notable for the following components:   Color, Urine YELLOW (*)    APPearance CLOUDY (*)    All other components within normal limits  CBC WITH DIFFERENTIAL/PLATELET  BASIC METABOLIC PANEL  URINE DRUG SCREEN, QUALITATIVE ([facility] ONLY)    PROCEDURES:  Critical Care performed: No  Procedures   MEDICATIONS ORDERED IN ED: Medications  lactated ringers bolus 1,000 mL (0 mLs Intravenous Stopped 03/04/22 1303)  oxyCODONE (Oxy IR/ROXICODONE) immediate release tablet 10 mg (10 mg Oral Given 03/04/22 1214)  lactated ringers bolus 1,000 mL (1,000 mLs Intravenous New Bag/Given 03/04/22 1305)     IMPRESSION /  MDM / ASSESSMENT AND PLAN / ED COURSE  I reviewed the triage vital signs and the nursing notes.                              27 y.o. male with past medical history of McArdle's disease and rhabdomyolysis who presents to the ED complaining of diffuse muscle aches after carrying heavy water bottles yesterday.  Patient's presentation is most consistent with acute presentation with potential threat to life or bodily function.  Differential diagnosis includes, but is not limited to, rhabdomyolysis, AKI, electrolyte abnormality, anemia, dehydration.  Patient well-appearing and in no acute distress, vital signs are unremarkable.  He has a benign abdominal exam and appears  well-hydrated, labs thus far show no significant anemia, leukocytosis, lecture abnormality, or AKI.  CK level is pending, will hydrate with IV fluids and treat symptomatically with oral oxycodone.  Patient CK markedly elevated at greater than 25,000, he does report pain improved on reassessment following oxycodone.  He will require admission for recurrent rhabdomyolysis and we will hydrate with additional 1 L IV fluid bolus.  Case discussed with hospitalist for admission.      FINAL CLINICAL IMPRESSION(S) / ED DIAGNOSES   Final diagnoses:  Non-traumatic rhabdomyolysis  McArdle's disease ([place])     Rx / DC Orders   ED Discharge Orders     None        Note:  This document was prepared using Dragon voice recognition software and may include unintentional dictation errors.   Blake Divine, MD 03/04/22 1331

## 2022-03-04 NOTE — ED Notes (Signed)
Advised nurse  patient has ready bed 

## 2022-03-04 NOTE — Assessment & Plan Note (Signed)
-  Secondary to nontraumatic rhabdomyolysis in setting of glycogen-storage disease - Treat per problem #1 as above - Recheck CK level in the a.m.

## 2022-03-04 NOTE — H&P (Signed)
History and Physical   Roberto Stanton ZOX:[id number] DOB: Nov 16, 1995 DOA: 03/04/2022  PCP: Gildardo Pounds, PA  Patient coming from: Home  I have personally briefly reviewed patient's old medical records in [facility] EMR.  Chief Concern: Generalized muscle aches  HPI: 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  Initial vitals in the ED showed temperature 98.1, respiration rate of 18, heart rate 96, blood pressure 132/96, SpO2 9096% on room air.  Serum sodium is 136, potassium 3.5, chloride 100, bicarb 30, BUN of 18, serum creatinine of 2.84, not fasting blood glucose 88, EGFR greater than 60, WBC 5.1, hemoglobin 14.7, platelets of 219.  UA was negative for leukocytes and nitrates. CK was elevated at 25,642.  ED treatment: Oxycodone 10 mg p.o. one-time dose, LR 2 L bolus. ------------------------------ At bedside, he is able to tell me his name, age, current location, current calendar year.   He denies trauma to his person. He denies nausea, vomiting.  He endorses generalized muscle aches.  He denies dysuria, hematuria, diarrhea, blood in his stool.  He reports no real increased activity.  He just went to the grocery store and started bringing groceries and then developed generalized body aches.  He states that the feeling is similar to his prior episode of elevated CK.  Social history: He lives with his roommate. He denies tobacco, etoh, recreational drug use.  ROS: Constitutional: no weight change, no fever ENT/Mouth: no sore throat, no rhinorrhea Eyes: no eye pain, no vision changes Cardiovascular: no chest pain, no dyspnea,  no edema, no palpitations Respiratory: no cough, no sputum, no wheezing Gastrointestinal: no  nausea, no vomiting, no diarrhea, no constipation Genitourinary: no urinary incontinence, no dysuria, no hematuria Musculoskeletal: no arthralgias, no myalgias Skin: no skin lesions, no pruritus, Neuro: + weakness, no loss of consciousness, no syncope Psych: no anxiety, no depression, no decrease appetite Heme/Lymph: no bruising, no bleeding  ED Course: Discussed with emergency medicine provider, patient requiring hospitalization for chief concerns of nontraumatic rhabdomyolysis.  Assessment/Plan  Principal Problem:   Non-traumatic rhabdomyolysis, recurrent Active Problems:   Rhabdomyolysis   Myophosphorylase deficiency (glycogen storage disease V, McArdle disease) (HCC)   Muscle cramps   Elevated CK   Large tonsils   Assessment and Plan:  * Non-traumatic rhabdomyolysis, recurrent - Status post LR 2 L bolus per EDP - Ordered additional LR 2 L bolus on admission and sodium chloride 150 mL/h, 1 day ordered - Symptomatic support: Morphine 4 mg IV every 4 hours as needed for moderate pain, 4 doses ordered; Dilaudid 1 mg IV every 3 hours as needed for severe pain, 5 doses ordered - AM team to reevaluate patient at bedside for continued opioid medication requirements - Repeat CK, CMP and CBC in the a.m.  Myophosphorylase deficiency (glycogen storage disease V, McArdle disease) (HCC) - Continue recommendation for hydration when at home including water and electrolyte as appropriate - Encourage healthy nutrition and avoidance of tobacco, EtOH, and recreational drug use; patient endorses understanding and compliance  Large tonsils - With mild purulence on the right side, and bilateral increase in redness - Check group A strep by PCR, if positive will initiate antibiotic treatment  Elevated CK - Secondary to nontraumatic rhabdomyolysis in setting of glycogen-storage disease - Treat per problem #1 as above - Recheck CK level in the a.m.  Muscle cramps - Secondary to rhabdomyolysis  which should improve once patient's rhabdo/CK levels normalizes - Symptomatic support as appropriate per problem #1  Chart reviewed.   DVT prophylaxis: Enoxaparin Code Status: Full code Diet: Regular Family Communication: none Disposition Plan: Pending CK level Consults called: None at this time Admission status: Telemetry medical, inpatient  Past Medical History:  Diagnosis Date   Kidney stones    McArdle disease ([place]) 09/28/2021   Pericarditis    Rhabdomyolysis    History reviewed. No pertinent surgical history.  Social History:  reports that he has quit smoking. He has never used smokeless tobacco. He reports that he does not currently use alcohol. He reports that he does not currently use drugs after having used the following drugs: Marijuana.  No Known Allergies Family History  Problem Relation Age of Onset   Heart disease Father    Family history: Family history reviewed and not pertinent.  Prior to Admission medications   Medication Sig Start Date End Date Taking? Authorizing Provider  oxyCODONE (OXY IR/ROXICODONE) 5 MG immediate release tablet Take 5 mg by mouth every 6 (six) hours as needed. 02/22/22  Yes [provider]  Oxycodone HCl 10 MG TABS Take 1 tablet (10 mg total) by mouth every 6 (six) hours as needed. 02/13/22   Donnamae Jude, MD  polyethylene glycol (MIRALAX / GLYCOLAX) 17 g packet Take 17 g by mouth 2 (two) times daily. Patient not taking: Reported on 02/11/2022 01/28/22   Richarda Osmond, MD   Physical Exam: Vitals:   03/04/22 1129 03/04/22 1135 03/04/22 1553  BP: (!) 132/96  129/87  Pulse: 96  91  Resp: 18  20  Temp: 98.1 F (36.7 C)  98.3 F (36.8 C)  TempSrc: Oral  Oral  SpO2: 96%  97%  Weight:  66 kg   Height:  5\' 9"  (1.753 m)    Constitutional: appears age-appropriate, NAD, calm, comfortable Eyes: PERRL, lids and conjunctivae normal ENMT: Mucous membranes are moist. Posterior pharynx clear of any exudate or lesions.  Age-appropriate dentition. Hearing appropriate.  Bilateral tonsils have increased redness, and mild purulence appearing white substance on the left tonsils Neck: normal, supple, no masses, no thyromegaly Respiratory: clear to auscultation bilaterally, no wheezing, no crackles. Normal respiratory effort. No accessory muscle use.  Cardiovascular: Regular rate and rhythm, no murmurs / rubs / gallops. No extremity edema. 2+ pedal pulses. No carotid bruits.  Abdomen: no tenderness, no masses palpated, no hepatosplenomegaly. Bowel sounds positive.  Musculoskeletal: no clubbing / cyanosis. No joint deformity upper and lower extremities. Good ROM, no contractures, no atrophy. Normal muscle tone.  Skin: no rashes, lesions, ulcers. No induration Neurologic: Sensation intact. Strength 5/5 in all 4.  Psychiatric: Normal judgment and insight. Alert and oriented x 3. Normal mood.   EKG: Not indicated at this time  Chest x-ray on Admission: I personally reviewed and I agree with radiologist reading as below.  DG Chest Port 1 View  Result Date: 03/04/2022 CLINICAL DATA:  10026 Shortness of breath 10026 [id number] Muscle ache [id number] EXAM: PORTABLE CHEST 1 VIEW COMPARISON:  February 11, 2022 FINDINGS: The cardiomediastinal silhouette is normal in contour. No pleural effusion. No pneumothorax. No acute pleuroparenchymal abnormality. Visualized abdomen is unremarkable. IMPRESSION: No acute cardiopulmonary abnormality. Electronically Signed   By: Valentino Saxon M.D.   On: 03/04/2022 14:05    Labs on Admission: I have personally reviewed following labs  CBC: Recent Labs  Lab 03/04/22 1130  WBC 5.1  NEUTROABS 2.8  HGB 14.7  HCT 42.8  MCV 91.6  PLT 096   Basic Metabolic Panel: Recent Labs  Lab 03/04/22 1130  NA 136  K 3.7  CL 100  CO2 30  GLUCOSE 88  BUN 18  CREATININE 0.84  CALCIUM 9.1   GFR: Estimated Creatinine Clearance: 123.3 mL/min (by C-G formula based on SCr of 0.84 mg/dL).  Cardiac  Enzymes: Recent Labs  Lab 03/04/22 1130  CKTOTAL 28,366*   Urine analysis:    Component Value Date/Time   COLORURINE YELLOW (A) 03/04/2022 1300   APPEARANCEUR CLOUDY (A) 03/04/2022 1300   LABSPEC 1.015 03/04/2022 1300   PHURINE 7.0 03/04/2022 1300   GLUCOSEU NEGATIVE 03/04/2022 1300   HGBUR NEGATIVE 03/04/2022 1300   BILIRUBINUR NEGATIVE 03/04/2022 1300   KETONESUR NEGATIVE 03/04/2022 1300   PROTEINUR NEGATIVE 03/04/2022 1300   NITRITE NEGATIVE 03/04/2022 [place] 03/04/2022 1300   This document was prepared using Dragon Voice Recognition software and may include unintentional dictation errors.  Dr. Tobie Poet [facility]  If 7PM-7AM, please contact overnight-coverage provider If 7AM-7PM, please contact day coverage provider [email or web address]  03/04/2022, 6:27 PM

## 2022-03-05 LAB — COMPREHENSIVE METABOLIC PANEL
ALT: 121 U/L — ABNORMAL HIGH (ref 0–44)
AST: 171 U/L — ABNORMAL HIGH (ref 15–41)
Albumin: 3.7 g/dL (ref 3.5–5.0)
Alkaline Phosphatase: 69 U/L (ref 38–126)
Anion gap: 7 (ref 5–15)
BUN: 10 mg/dL (ref 6–20)
CO2: 27 mmol/L (ref 22–32)
Calcium: 8.7 mg/dL — ABNORMAL LOW (ref 8.9–10.3)
Chloride: 102 mmol/L (ref 98–111)
Creatinine, Ser: 0.59 mg/dL — ABNORMAL LOW (ref 0.61–1.24)
GFR, Estimated: >60 mL/min (ref >60)
Glucose, Bld: 95 mg/dL (ref 70–99)
Potassium: 3.5 mmol/L (ref 3.5–5.1)
Sodium: 136 mmol/L (ref 135–145)
Total Bilirubin: 0.7 mg/dL (ref 0.3–1.2)
Total Protein: 7.2 g/dL (ref 6.5–8.1)

## 2022-03-05 LAB — CBC
HCT: 40.6 % (ref 39.0–52.0)
Hemoglobin: 14 g/dL (ref 13.0–17.0)
MCH: 31.5 pg (ref 26.0–34.0)
MCHC: 34.5 g/dL (ref 30.0–36.0)
MCV: 91.4 fL (ref 80.0–100.0)
Platelets: 199 10*3/uL (ref 150–400)
RBC: 4.44 MIL/uL (ref 4.22–5.81)
RDW: 13 % (ref 11.5–15.5)
WBC: 5.7 10*3/uL (ref 4.0–10.5)
nRBC: 0 % (ref 0.0–0.2)

## 2022-03-05 LAB — CK: Total CK: 18627 U/L — ABNORMAL HIGH (ref 49–397)

## 2022-03-05 MED ORDER — SODIUM CHLORIDE 0.9 % IV SOLN: INTRAVENOUS | Status: DC

## 2022-03-05 MED ORDER — HYDROMORPHONE HCL 1 MG/ML IJ SOLN
1.0000 mg | INTRAMUSCULAR | Status: AC | PRN
  Administered 2022-03-05 (×4): 1 mg via INTRAVENOUS
  Filled 2022-03-05 (×4): qty 1

## 2022-03-05 MED ORDER — MORPHINE SULFATE (PF) 4 MG/ML IV SOLN
4.0000 mg | INTRAVENOUS | Status: AC | PRN
  Administered 2022-03-05 – 2022-03-06 (×4): 4 mg via INTRAVENOUS
  Filled 2022-03-05 (×4): qty 1

## 2022-03-05 MED ORDER — OXYCODONE HCL 5 MG PO TABS: 10.0000 mg | ORAL_TABLET | Freq: Once | ORAL | Status: DC

## 2022-03-05 NOTE — TOC Initial Note (Signed)
Transition of Care [facility]) - Progression Note    Patient Details  Name: DERREN SUYDAM MRN: [id number] Date of Birth: 31-Aug-1995  Transition of Care [facility]) CM/SW [employer], RN Phone Number: 03/05/2022, 12:34 PM  Clinical Narrative:      Transition of Care [facility] Screening Note   Patient Details  Name: DARIUS LUNDBERG Date of Birth: 1995/04/22   Transition of Care [facility]) CM/SW Contact:    Laurena Slimmer, RN Phone Number: 03/05/2022, 12:34 PM    Transition of Care Department [facility]) has reviewed patient and no TOC needs have been identified at this time. We will continue to monitor patient advancement through interdisciplinary progression rounds. If new patient transition needs arise, please place a TOC consult.         Expected Discharge Plan and Services                                               Social Determinants of Health (SDOH) Interventions SDOH Screenings   Food Insecurity: No Food Insecurity (03/04/2022)  Housing: Low Risk  (03/04/2022)  Transportation Needs: No Transportation Needs (03/04/2022)  Utilities: Not At Risk (03/04/2022)  Tobacco Use: Medium Risk (03/04/2022)    Readmission Risk Interventions     No data to display

## 2022-03-05 NOTE — Progress Notes (Signed)
[facility] PROGRESS NOTE  Roberto Stanton QPY:[id number] DOB: 09-04-1995 DOA: 03/04/2022 PCP: Gildardo Pounds, PA  Status is: Inpatient Remains inpatient appropriate because: Rhabdomyolysis, high CPK, IV fluids  Barriers to discharge: Social:  Clinical:  Level of care: Telemetry Medical   Code Status: Full Family Communication: Patient DVT prophylaxis: Lovenox COVID vaccination status:  Foley catheter: POA: Date inserted:  HPI:  Subjective:   Objective: Vitals:   03/05/22 0438 03/05/22 0818  BP: 136/82 117/86  Pulse: 85 82  Resp: 18 18  Temp: 97.9 F (36.6 C) 97.7 F (36.5 C)  SpO2: 100% 99%    Intake/Output Summary (Last 24 hours) at 03/05/2022 0906 Last data filed at 03/05/2022 0442 Gross per 24 hour  Intake 4771.2 ml  Output --  Net 4771.2 ml   Filed Weights   03/04/22 1135  Weight: 66 kg    Exam:  Constitutional: NAD, calm, comfortable Eyes: PERRL, lids and conjunctivae normal ENMT: Mucous membranes are moist. Posterior pharynx clear of any exudate or lesions.Normal dentition.  Neck: normal, supple, no masses, no thyromegaly Respiratory: clear to auscultation bilaterally, no wheezing, no crackles. Normal respiratory effort. No accessory muscle use.  Cardiovascular: Regular rate and rhythm, no murmurs / rubs / gallops. No extremity edema. 2+ pedal pulses. No carotid bruits.  Abdomen: no tenderness, no masses palpated. No hepatosplenomegaly. Bowel sounds positive.  Musculoskeletal: no clubbing / cyanosis. No joint deformity upper and lower extremities. Good ROM, no contractures. Normal muscle tone.  Skin: no rashes, lesions, ulcers. No induration Neurologic: CN 2-12 grossly intact. Sensation intact, DTR normal. Strength 5/5 x all 4 extremities.  Psychiatric: Normal judgment and insight. Alert and oriented x 3. Normal mood.    Assessment/Plan:  27 year old male with history of myocardial disease/ glycogen storage disease, 5/micro fossa for  eye LASIK deficiency, frequent hospitalizations for nontraumatic rhabdomyolysis presented to the emergency department with complaints of generalized muscle cramps started on 02/10/2022 when he went for hunting required long walk.  Workup in the emergency department patient is found to have CPK level of 7798, after receiving the IV fluids, trended up to 12,029.  Rhabdomyolysis -secondary to underlying glycogen storage disease, physical exertion -continue with IV fluids -closely monitor CPK [phone] -Closely follow-up with kidney function - correct the electrolyte imbalances. -continue with pain management as needed  Mc cardial disease - glycogen storage disease -continue with IV fluids -continue with supportive care   Other problems:    Data Reviewed: Basic Metabolic Panel: Recent Labs  Lab 03/04/22 1130 03/05/22 0501  NA 136 136  K 3.7 3.5  CL 100 102  CO2 30 27  GLUCOSE 88 95  BUN 18 10  CREATININE 0.84 0.59*  CALCIUM 9.1 8.7*   Liver Function Tests: Recent Labs  Lab 03/05/22 0501  AST 171*  ALT 121*  ALKPHOS 69  BILITOT 0.7  PROT 7.2  ALBUMIN 3.7   No results for input(s): "LIPASE", "AMYLASE" in the last 168 hours. No results for input(s): "AMMONIA" in the last 168 hours. CBC: Recent Labs  Lab 03/04/22 1130 03/05/22 0501  WBC 5.1 5.7  NEUTROABS 2.8  --   HGB 14.7 14.0  HCT 42.8 40.6  MCV 91.6 91.4  PLT 219 199   Cardiac Enzymes: Recent Labs  Lab 03/04/22 1130 03/05/22 0501  CKTOTAL 25,642* 18,627*   BNP (last 3 results) No results for input(s): "BNP" in the last 8760 hours.  ProBNP (last 3 results) No results for input(s): "PROBNP" in the last 8760 hours.  CBG: No results for input(s): "GLUCAP" in the last 168 hours.  Recent Results (from the past 240 hour(s))  Resp panel by RT-PCR (RSV, Flu A&B, Covid) Anterior Nasal Swab     Status: None   Collection Time: 03/04/22  1:46 PM   Specimen: Anterior Nasal Swab  Result Value Ref Range  Status   SARS Coronavirus 2 by RT PCR NEGATIVE NEGATIVE Final    Comment: (NOTE) SARS-CoV-2 target nucleic acids are NOT DETECTED.  The SARS-CoV-2 RNA is generally detectable in upper respiratory specimens during the acute phase of infection. The lowest concentration of SARS-CoV-2 viral copies this assay can detect is 138 copies/mL. A negative result does not preclude SARS-Cov-2 infection and should not be used as the sole basis for treatment or other patient management decisions. A negative result may occur with  improper specimen collection/handling, submission of specimen other than nasopharyngeal swab, presence of viral mutation(s) within the areas targeted by this assay, and inadequate number of viral copies(<138 copies/mL). A negative result must be combined with clinical observations, patient history, and epidemiological information. The expected result is Negative.  Fact Sheet for Patients:  EntrepreneurPulse.com.au  Fact Sheet for Healthcare Providers:  IncredibleEmployment.be  This test is no t yet approved or cleared by the [place] FDA and  has been authorized for detection and/or diagnosis of SARS-CoV-2 by FDA under an Emergency Use Authorization (EUA). This EUA will remain  in effect (meaning this test can be used) for the duration of the COVID-19 declaration under Section 564(b)(1) of the Act, 21 U.S.C.section 360bbb-3(b)(1), unless the authorization is terminated  or revoked sooner.       Influenza A by PCR NEGATIVE NEGATIVE Final   Influenza B by PCR NEGATIVE NEGATIVE Final    Comment: (NOTE) The Xpert Xpress SARS-CoV-2/FLU/RSV plus assay is intended as an aid in the diagnosis of influenza from Nasopharyngeal swab specimens and should not be used as a sole basis for treatment. Nasal washings and aspirates are unacceptable for Xpert Xpress SARS-CoV-2/FLU/RSV testing.  Fact Sheet for  Patients: EntrepreneurPulse.com.au  Fact Sheet for Healthcare Providers: IncredibleEmployment.be  This test is not yet approved or cleared by the [place] FDA and has been authorized for detection and/or diagnosis of SARS-CoV-2 by FDA under an Emergency Use Authorization (EUA). This EUA will remain in effect (meaning this test can be used) for the duration of the COVID-19 declaration under Section 564(b)(1) of the Act, 21 U.S.C. section 360bbb-3(b)(1), unless the authorization is terminated or revoked.     Resp Syncytial Virus by PCR NEGATIVE NEGATIVE Final    Comment: (NOTE) Fact Sheet for Patients: EntrepreneurPulse.com.au  Fact Sheet for Healthcare Providers: IncredibleEmployment.be  This test is not yet approved or cleared by the [place] FDA and has been authorized for detection and/or diagnosis of SARS-CoV-2 by FDA under an Emergency Use Authorization (EUA). This EUA will remain in effect (meaning this test can be used) for the duration of the COVID-19 declaration under Section 564(b)(1) of the Act, 21 U.S.C. section 360bbb-3(b)(1), unless the authorization is terminated or revoked.  Performed at [facility], [address] [place], [place] [address]   Group A Strep by PCR     Status: None   Collection Time: 03/04/22  6:17 PM   Specimen: Throat; Sterile Swab  Result Value Ref Range Status   Group A Strep by PCR NOT DETECTED NOT DETECTED Final    Comment: Performed at [facility], [address], [place], [place] [address]  Studies: DG Chest Port 1 View  Result Date: 03/04/2022 CLINICAL DATA:  10026 Shortness of breath 10026 144555 Muscle ache 060045 EXAM: PORTABLE CHEST 1 VIEW COMPARISON:  February 11, 2022 FINDINGS: The cardiomediastinal silhouette is normal in contour. No pleural effusion. No pneumothorax. No acute pleuroparenchymal abnormality. Visualized  abdomen is unremarkable. IMPRESSION: No acute cardiopulmonary abnormality. Electronically Signed   By: Valentino Saxon M.D.   On: 03/04/2022 14:05    Scheduled Meds:  enoxaparin (LOVENOX) injection  40 mg Subcutaneous Q24H   Continuous Infusions:  sodium chloride 150 mL/hr at 03/05/22 9977    Principal Problem:   Non-traumatic rhabdomyolysis, recurrent Active Problems:   Rhabdomyolysis   Myophosphorylase deficiency (glycogen storage disease V, McArdle disease) (HCC)   Muscle cramps   Elevated CK   Large tonsils   Consultants:   Procedures:   Antibiotics:  (indicate start date, and stop date if known)   Time spent: 35 minutes    Dajae Kizer ANP  [facility] 7 am - 330 pm/M-F for direct patient care and secure chat Please refer to Amion for contact info 1  days

## 2022-03-06 DIAGNOSIS — M6282 Rhabdomyolysis: Secondary | ICD-10-CM | POA: Diagnosis not present

## 2022-03-06 LAB — COMPREHENSIVE METABOLIC PANEL
ALT: 107 U/L — ABNORMAL HIGH (ref 0–44)
AST: 98 U/L — ABNORMAL HIGH (ref 15–41)
Albumin: 3.9 g/dL (ref 3.5–5.0)
Alkaline Phosphatase: 68 U/L (ref 38–126)
Anion gap: 8 (ref 5–15)
BUN: 8 mg/dL (ref 6–20)
CO2: 27 mmol/L (ref 22–32)
Calcium: 9 mg/dL (ref 8.9–10.3)
Chloride: 101 mmol/L (ref 98–111)
Creatinine, Ser: 0.6 mg/dL — ABNORMAL LOW (ref 0.61–1.24)
GFR, Estimated: >60 mL/min (ref >60)
Glucose, Bld: 96 mg/dL (ref 70–99)
Potassium: 3.5 mmol/L (ref 3.5–5.1)
Sodium: 136 mmol/L (ref 135–145)
Total Bilirubin: 0.8 mg/dL (ref 0.3–1.2)
Total Protein: 7.7 g/dL (ref 6.5–8.1)

## 2022-03-06 LAB — CK: Total CK: 5877 U/L — ABNORMAL HIGH (ref 49–397)

## 2022-03-06 MED ORDER — MORPHINE SULFATE 15 MG PO TABS
15.0000 mg | ORAL_TABLET | ORAL | Status: DC | PRN
  Administered 2022-03-06: 15 mg via ORAL
  Filled 2022-03-06: qty 1

## 2022-03-06 MED ORDER — MORPHINE SULFATE (PF) 2 MG/ML IV SOLN
2.0000 mg | INTRAVENOUS | Status: DC | PRN
  Administered 2022-03-06: 2 mg via INTRAVENOUS
  Filled 2022-03-06 (×2): qty 1

## 2022-03-06 MED ORDER — OXYCODONE HCL 5 MG PO TABS
10.0000 mg | ORAL_TABLET | Freq: Four times a day (QID) | ORAL | Status: DC | PRN
  Administered 2022-03-06: 10 mg via ORAL
  Filled 2022-03-06: qty 2

## 2022-03-06 MED ORDER — OXYCODONE HCL 5 MG PO TABS
10.0000 mg | ORAL_TABLET | Freq: Once | ORAL | Status: AC
  Administered 2022-03-06: 10 mg via ORAL
  Filled 2022-03-06: qty 2

## 2022-03-06 MED ORDER — ACETAMINOPHEN 500 MG PO TABS
1000.0000 mg | ORAL_TABLET | Freq: Three times a day (TID) | ORAL | Status: DC
  Administered 2022-03-06: 1000 mg via ORAL
  Filled 2022-03-06: qty 2

## 2022-03-06 MED ORDER — OXYCODONE HCL 10 MG PO TABS: 10.0000 mg | ORAL_TABLET | Freq: Four times a day (QID) | ORAL | 0 refills | Status: DC | PRN

## 2022-03-06 MED ORDER — KETOROLAC TROMETHAMINE 15 MG/ML IJ SOLN
15.0000 mg | Freq: Four times a day (QID) | INTRAMUSCULAR | Status: DC | PRN
  Filled 2022-03-06: qty 1

## 2022-03-06 NOTE — Discharge Summary (Signed)
Physician Discharge Summary   Roberto Stanton  male DOB: 07/21/1995  RSW:[id number]  PCP: Rutherford Limerick, PA  Admit date: 03/04/2022 Discharge date: 03/06/2022  Admitted From: home Disposition:  home CODE STATUS: Full code   Hospital Course:  For full details, please see H&P, progress notes, consult notes and ancillary notes.  Briefly,  [name] is a 27 year old male with history of myocardial disease/ glycogen storage disease, 5/micro fossa for eye LASIK deficiency, frequent hospitalizations for nontraumatic rhabdomyolysis presented to the emergency department with complaints of generalized muscle cramps started on 02/10/2022 when he went for hunting that required long walk.  Workup in the emergency department patient is found to have CPK level of 25,642.   Rhabdomyolysis -secondary to underlying glycogen storage disease, physical exertion --Pt received aggressive IVF, and CK trended down to 5877 in just 2 days.  No AKI.  Pt asked to be discharged and understood that he should increase his hydration.  CK is expected to continue to trend down. --pt reported significant pain, so was discharged with 5 days of oxycodone.   Unless noted above, medications under "STOP" list are ones pt was not taking PTA.  Discharge Diagnoses:  Principal Problem:   Non-traumatic rhabdomyolysis, recurrent Active Problems:   Rhabdomyolysis   Myophosphorylase deficiency (glycogen storage disease V, McArdle disease) (HCC)   Muscle cramps   Elevated CK   Large tonsils   30 Day Unplanned Readmission Risk Score    Flowsheet Row ED to Hosp-Admission (Current) from 03/04/2022 in [facility]  30 Day Unplanned Readmission Risk Score (%) 19.69 Filed at 03/06/2022 1200       This score is the patient's risk of an unplanned readmission within 30 days of being discharged (0 -100%). The score is based on dignosis, age, lab data, medications, orders, and past  utilization.   Low:  0-14.9   Medium: 15-21.9   High: 22-29.9   Extreme: 30 and above         Discharge Instructions:  Allergies as of 03/06/2022   No Known Allergies      Medication List     STOP taking these medications    polyethylene glycol 17 g packet Commonly known as: MIRALAX / GLYCOLAX       TAKE these medications    Oxycodone HCl 10 MG Tabs Take 1 tablet (10 mg total) by mouth every 6 (six) hours as needed for up to 5 days. What changed: Another medication with the same name was removed. Continue taking this medication, and follow the directions you see here.         Follow-up Information     August Luz A, PA Follow up in 1 week(s).   Specialty: Physician Assistant Contact information: [address] [place] [address] [phone]                 No Known Allergies   The results of significant diagnostics from this hospitalization (including imaging, microbiology, ancillary and laboratory) are listed below for reference.   Consultations:   Procedures/Studies: DG Chest Port 1 View  Result Date: 03/04/2022 CLINICAL DATA:  10026 Shortness of breath 10026 [id number] Muscle ache [id number] EXAM: PORTABLE CHEST 1 VIEW COMPARISON:  February 11, 2022 FINDINGS: The cardiomediastinal silhouette is normal in contour. No pleural effusion. No pneumothorax. No acute pleuroparenchymal abnormality. Visualized abdomen is unremarkable. IMPRESSION: No acute cardiopulmonary abnormality. Electronically Signed   By: Valentino Saxon M.D.   On: 03/04/2022 14:05  DG Chest Port 1 View  Result Date: 02/11/2022 CLINICAL DATA:  Dyspnea on exertion EXAM: PORTABLE CHEST 1 VIEW COMPARISON:  01/25/2022 FINDINGS: Single frontal view of the chest demonstrates a an unremarkable cardiac silhouette. No acute airspace disease, effusion, or pneumothorax. No acute bony abnormality. IMPRESSION: 1. No acute intrathoracic process. Electronically Signed   By: Sharlet Salina M.D.    On: 02/11/2022 15:10      Labs: BNP (last 3 results) No results for input(s): "BNP" in the last 8760 hours. Basic Metabolic Panel: Recent Labs  Lab 03/04/22 1130 03/05/22 0501 03/06/22 1407  NA 136 136 136  K 3.7 3.5 3.5  CL 100 102 101  CO2 30 27 27   GLUCOSE 88 95 96  BUN 18 10 8   CREATININE 0.84 0.59* 0.60*  CALCIUM 9.1 8.7* 9.0   Liver Function Tests: Recent Labs  Lab 03/05/22 0501 03/06/22 1407  AST 171* 98*  ALT 121* 107*  ALKPHOS 69 68  BILITOT 0.7 0.8  PROT 7.2 7.7  ALBUMIN 3.7 3.9   No results for input(s): "LIPASE", "AMYLASE" in the last 168 hours. No results for input(s): "AMMONIA" in the last 168 hours. CBC: Recent Labs  Lab 03/04/22 1130 03/05/22 0501  WBC 5.1 5.7  NEUTROABS 2.8  --   HGB 14.7 14.0  HCT 42.8 40.6  MCV 91.6 91.4  PLT 219 199   Cardiac Enzymes: Recent Labs  Lab 03/04/22 1130 03/05/22 0501 03/06/22 1407  CKTOTAL 25,642* 18,627* 5,877*   BNP: Invalid input(s): "POCBNP" CBG: No results for input(s): "GLUCAP" in the last 168 hours. D-Dimer No results for input(s): "DDIMER" in the last 72 hours. Hgb A1c No results for input(s): "HGBA1C" in the last 72 hours. Lipid Profile No results for input(s): "CHOL", "HDL", "LDLCALC", "TRIG", "CHOLHDL", "LDLDIRECT" in the last 72 hours. Thyroid function studies No results for input(s): "TSH", "T4TOTAL", "T3FREE", "THYROIDAB" in the last 72 hours.  Invalid input(s): "FREET3" Anemia work up No results for input(s): "VITAMINB12", "FOLATE", "FERRITIN", "TIBC", "IRON", "RETICCTPCT" in the last 72 hours. Urinalysis    Component Value Date/Time   COLORURINE YELLOW (A) 03/04/2022 1300   APPEARANCEUR CLOUDY (A) 03/04/2022 1300   LABSPEC 1.015 03/04/2022 1300   PHURINE 7.0 03/04/2022 1300   GLUCOSEU NEGATIVE 03/04/2022 1300   HGBUR NEGATIVE 03/04/2022 1300   BILIRUBINUR NEGATIVE 03/04/2022 1300   KETONESUR NEGATIVE 03/04/2022 1300   PROTEINUR NEGATIVE 03/04/2022 1300   NITRITE  NEGATIVE 03/04/2022 1300   LEUKOCYTESUR NEGATIVE 03/04/2022 1300   Sepsis Labs Recent Labs  Lab 03/04/22 1130 03/05/22 0501  WBC 5.1 5.7   Microbiology Recent Results (from the past 240 hour(s))  Resp panel by RT-PCR (RSV, Flu A&B, Covid) Anterior Nasal Swab     Status: None   Collection Time: 03/04/22  1:46 PM   Specimen: Anterior Nasal Swab  Result Value Ref Range Status   SARS Coronavirus 2 by RT PCR NEGATIVE NEGATIVE Final    Comment: (NOTE) SARS-CoV-2 target nucleic acids are NOT DETECTED.  The SARS-CoV-2 RNA is generally detectable in upper respiratory specimens during the acute phase of infection. The lowest concentration of SARS-CoV-2 viral copies this assay can detect is 138 copies/mL. A negative result does not preclude SARS-Cov-2 infection and should not be used as the sole basis for treatment or other patient management decisions. A negative result may occur with  improper specimen collection/handling, submission of specimen other than nasopharyngeal swab, presence of viral mutation(s) within the areas targeted by this assay, and inadequate number of  viral copies(<138 copies/mL). A negative result must be combined with clinical observations, patient history, and epidemiological information. The expected result is Negative.  Fact Sheet for Patients:  EntrepreneurPulse.com.au  Fact Sheet for Healthcare Providers:  IncredibleEmployment.be  This test is no t yet approved or cleared by the [place] FDA and  has been authorized for detection and/or diagnosis of SARS-CoV-2 by FDA under an Emergency Use Authorization (EUA). This EUA will remain  in effect (meaning this test can be used) for the duration of the COVID-19 declaration under Section 564(b)(1) of the Act, 21 U.S.C.section 360bbb-3(b)(1), unless the authorization is terminated  or revoked sooner.       Influenza A by PCR NEGATIVE NEGATIVE Final   Influenza B by  PCR NEGATIVE NEGATIVE Final    Comment: (NOTE) The Xpert Xpress SARS-CoV-2/FLU/RSV plus assay is intended as an aid in the diagnosis of influenza from Nasopharyngeal swab specimens and should not be used as a sole basis for treatment. Nasal washings and aspirates are unacceptable for Xpert Xpress SARS-CoV-2/FLU/RSV testing.  Fact Sheet for Patients: EntrepreneurPulse.com.au  Fact Sheet for Healthcare Providers: IncredibleEmployment.be  This test is not yet approved or cleared by the [place] FDA and has been authorized for detection and/or diagnosis of SARS-CoV-2 by FDA under an Emergency Use Authorization (EUA). This EUA will remain in effect (meaning this test can be used) for the duration of the COVID-19 declaration under Section 564(b)(1) of the Act, 21 U.S.C. section 360bbb-3(b)(1), unless the authorization is terminated or revoked.     Resp Syncytial Virus by PCR NEGATIVE NEGATIVE Final    Comment: (NOTE) Fact Sheet for Patients: EntrepreneurPulse.com.au  Fact Sheet for Healthcare Providers: IncredibleEmployment.be  This test is not yet approved or cleared by the [place] FDA and has been authorized for detection and/or diagnosis of SARS-CoV-2 by FDA under an Emergency Use Authorization (EUA). This EUA will remain in effect (meaning this test can be used) for the duration of the COVID-19 declaration under Section 564(b)(1) of the Act, 21 U.S.C. section 360bbb-3(b)(1), unless the authorization is terminated or revoked.  Performed at [facility], [address] [place], [place] [address]   Group A Strep by PCR     Status: None   Collection Time: 03/04/22  6:17 PM   Specimen: Throat; Sterile Swab  Result Value Ref Range Status   Group A Strep by PCR NOT DETECTED NOT DETECTED Final    Comment: Performed at [facility], [address] [place], [place] [address]      Total time spend on discharging this patient, including the last patient exam, discussing the hospital stay, instructions for ongoing care as it relates to all pertinent caregivers, as well as preparing the medical discharge records, prescriptions, and/or referrals as applicable, is 35 minutes.    Enzo Bi, MD  [facility] 03/06/2022, 3:33 PM

## 2022-03-08 ENCOUNTER — Encounter: Payer: Self-pay | Admitting: Emergency Medicine

## 2022-03-08 DIAGNOSIS — Z87442 Personal history of urinary calculi: Secondary | ICD-10-CM

## 2022-03-08 DIAGNOSIS — Z8249 Family history of ischemic heart disease and other diseases of the circulatory system: Secondary | ICD-10-CM

## 2022-03-08 DIAGNOSIS — E7404 McArdle disease: Principal | ICD-10-CM | POA: Diagnosis present

## 2022-03-08 DIAGNOSIS — Z87891 Personal history of nicotine dependence: Secondary | ICD-10-CM

## 2022-03-08 DIAGNOSIS — K047 Periapical abscess without sinus: Secondary | ICD-10-CM | POA: Diagnosis present

## 2022-03-08 LAB — CBC WITH DIFFERENTIAL/PLATELET
Abs Immature Granulocytes: 0.03 10*3/uL (ref 0.00–0.07)
Basophils Absolute: 0.1 10*3/uL (ref 0.0–0.1)
Basophils Relative: 1 %
Eosinophils Absolute: 0.1 10*3/uL (ref 0.0–0.5)
Eosinophils Relative: 2 %
HCT: 45.7 % (ref 39.0–52.0)
Hemoglobin: 15.8 g/dL (ref 13.0–17.0)
Immature Granulocytes: 0 %
Lymphocytes Relative: 21 %
Lymphs Abs: 2 10*3/uL (ref 0.7–4.0)
MCH: 31.2 pg (ref 26.0–34.0)
MCHC: 34.6 g/dL (ref 30.0–36.0)
MCV: 90.3 fL (ref 80.0–100.0)
Monocytes Absolute: 0.6 10*3/uL (ref 0.1–1.0)
Monocytes Relative: 7 %
Neutro Abs: 6.8 10*3/uL (ref 1.7–7.7)
Neutrophils Relative %: 69 %
Platelets: 229 10*3/uL (ref 150–400)
RBC: 5.06 MIL/uL (ref 4.22–5.81)
RDW: 13.1 % (ref 11.5–15.5)
WBC: 9.6 10*3/uL (ref 4.0–10.5)
nRBC: 0 % (ref 0.0–0.2)

## 2022-03-08 NOTE — ED Triage Notes (Signed)
Pt presents via POV with complaints of bilateral leg pain. Hx of rhabdo - D/C 1/16 for same. He states he was taking a hot shower and had some cramping with sharp/ching pain.  Denies CP or SOB.

## 2022-03-09 ENCOUNTER — Inpatient Hospital Stay
Admission: EM | Admit: 2022-03-09 | Discharge: 2022-03-13 | DRG: 642 | Disposition: A | Discharge status: Home / Self Care | Payer: Medicaid Other | Attending: Internal Medicine | Admitting: Internal Medicine

## 2022-03-09 ENCOUNTER — Encounter: Payer: Self-pay | Admitting: Internal Medicine

## 2022-03-09 DIAGNOSIS — M6282 Rhabdomyolysis: Secondary | ICD-10-CM

## 2022-03-09 DIAGNOSIS — R52 Pain, unspecified: Secondary | ICD-10-CM | POA: Insufficient documentation

## 2022-03-09 DIAGNOSIS — R7401 Elevation of levels of liver transaminase levels: Secondary | ICD-10-CM | POA: Diagnosis present

## 2022-03-09 DIAGNOSIS — Z87891 Personal history of nicotine dependence: Secondary | ICD-10-CM | POA: Diagnosis not present

## 2022-03-09 DIAGNOSIS — E7404 McArdle disease: Secondary | ICD-10-CM

## 2022-03-09 DIAGNOSIS — Z87442 Personal history of urinary calculi: Secondary | ICD-10-CM | POA: Diagnosis not present

## 2022-03-09 DIAGNOSIS — K047 Periapical abscess without sinus: Secondary | ICD-10-CM | POA: Diagnosis present

## 2022-03-09 DIAGNOSIS — Z8249 Family history of ischemic heart disease and other diseases of the circulatory system: Secondary | ICD-10-CM | POA: Diagnosis not present

## 2022-03-09 DIAGNOSIS — G8929 Other chronic pain: Secondary | ICD-10-CM | POA: Insufficient documentation

## 2022-03-09 LAB — CBC
HCT: 43.2 % (ref 39.0–52.0)
Hemoglobin: 15 g/dL (ref 13.0–17.0)
MCH: 30.8 pg (ref 26.0–34.0)
MCHC: 34.7 g/dL (ref 30.0–36.0)
MCV: 88.7 fL (ref 80.0–100.0)
Platelets: 196 K/uL (ref 150–400)
RBC: 4.87 MIL/uL (ref 4.22–5.81)
RDW: 13 % (ref 11.5–15.5)
WBC: 10 K/uL (ref 4.0–10.5)
nRBC: 0 % (ref 0.0–0.2)

## 2022-03-09 LAB — COMPREHENSIVE METABOLIC PANEL
ALT: 153 U/L — ABNORMAL HIGH (ref 0–44)
AST: 269 U/L — ABNORMAL HIGH (ref 15–41)
Albumin: 4.2 g/dL (ref 3.5–5.0)
Alkaline Phosphatase: 79 U/L (ref 38–126)
Anion gap: 11 (ref 5–15)
BUN: 18 mg/dL (ref 6–20)
CO2: 23 mmol/L (ref 22–32)
Calcium: 9 mg/dL (ref 8.9–10.3)
Chloride: 99 mmol/L (ref 98–111)
Creatinine, Ser: 0.81 mg/dL (ref 0.61–1.24)
GFR, Estimated: >60 mL/min (ref >60)
Glucose, Bld: 94 mg/dL (ref 70–99)
Potassium: 4 mmol/L (ref 3.5–5.1)
Sodium: 133 mmol/L — ABNORMAL LOW (ref 135–145)
Total Bilirubin: 0.6 mg/dL (ref 0.3–1.2)
Total Protein: 8.1 g/dL (ref 6.5–8.1)

## 2022-03-09 LAB — URINALYSIS, ROUTINE W REFLEX MICROSCOPIC
Bacteria, UA: NONE SEEN
Bilirubin Urine: NEGATIVE
Glucose, UA: NEGATIVE mg/dL
Ketones, ur: NEGATIVE mg/dL
Leukocytes,Ua: NEGATIVE
Nitrite: NEGATIVE
Protein, ur: 100 mg/dL — AB
Specific Gravity, Urine: 1.008 (ref 1.005–1.030)
Squamous Epithelial / HPF: NONE SEEN /HPF (ref 0–5)
pH: 6 (ref 5.0–8.0)

## 2022-03-09 LAB — CREATININE, SERUM
Creatinine, Ser: 0.79 mg/dL (ref 0.61–1.24)
GFR, Estimated: >60 mL/min (ref >60)

## 2022-03-09 LAB — CK: Total CK: 47805 U/L — ABNORMAL HIGH (ref 49–397)

## 2022-03-09 MED ORDER — LACTATED RINGERS IV SOLN: INTRAVENOUS | Status: DC

## 2022-03-09 MED ORDER — HYDROMORPHONE HCL 1 MG/ML IJ SOLN
1.0000 mg | INTRAMUSCULAR | Status: AC
  Administered 2022-03-09: 1 mg via INTRAVENOUS
  Filled 2022-03-09: qty 1

## 2022-03-09 MED ORDER — ACETAMINOPHEN 650 MG RE SUPP: 650.0000 mg | Freq: Four times a day (QID) | RECTAL | Status: DC | PRN

## 2022-03-09 MED ORDER — OXYCODONE HCL 5 MG PO TABS
10.0000 mg | ORAL_TABLET | ORAL | Status: DC | PRN
  Administered 2022-03-09 – 2022-03-13 (×11): 10 mg via ORAL
  Filled 2022-03-09 (×11): qty 2

## 2022-03-09 MED ORDER — ONDANSETRON HCL 4 MG PO TABS: 4.0000 mg | ORAL_TABLET | Freq: Four times a day (QID) | ORAL | Status: DC | PRN

## 2022-03-09 MED ORDER — ACETAMINOPHEN 325 MG PO TABS: 650.0000 mg | ORAL_TABLET | Freq: Four times a day (QID) | ORAL | Status: DC | PRN

## 2022-03-09 MED ORDER — ENOXAPARIN SODIUM 40 MG/0.4ML IJ SOSY
40.0000 mg | PREFILLED_SYRINGE | INTRAMUSCULAR | Status: DC
  Administered 2022-03-10 – 2022-03-12 (×3): 40 mg via SUBCUTANEOUS
  Filled 2022-03-09 (×5): qty 0.4

## 2022-03-09 MED ORDER — HYDROMORPHONE HCL 1 MG/ML IJ SOLN
1.0000 mg | INTRAMUSCULAR | Status: DC | PRN
  Administered 2022-03-09 – 2022-03-11 (×15): 1 mg via INTRAVENOUS
  Filled 2022-03-09 (×15): qty 1

## 2022-03-09 MED ORDER — ONDANSETRON HCL 4 MG/2ML IJ SOLN
4.0000 mg | INTRAMUSCULAR | Status: AC
  Administered 2022-03-09: 4 mg via INTRAVENOUS
  Filled 2022-03-09: qty 2

## 2022-03-09 MED ORDER — ONDANSETRON HCL 4 MG/2ML IJ SOLN
4.0000 mg | Freq: Four times a day (QID) | INTRAMUSCULAR | Status: DC | PRN
  Administered 2022-03-11: 4 mg via INTRAVENOUS
  Filled 2022-03-09: qty 2

## 2022-03-09 MED ORDER — LACTATED RINGERS IV BOLUS
2000.0000 mL | Freq: Once | INTRAVENOUS | Status: AC
  Administered 2022-03-09: 2000 mL via INTRAVENOUS

## 2022-03-09 MED ORDER — OXYCODONE HCL 5 MG PO TABS
10.0000 mg | ORAL_TABLET | Freq: Four times a day (QID) | ORAL | Status: DC | PRN
  Administered 2022-03-09: 10 mg via ORAL
  Filled 2022-03-09 (×2): qty 2

## 2022-03-09 NOTE — Progress Notes (Signed)
  Transition of Care [facility]) Screening Note   Patient Details  Name: Roberto Stanton Date of Birth: Feb 23, 1995   Transition of Care [facility]) CM/SW Contact:    Quin Hoop, LCSW Phone Number: 03/09/2022, 10:28 AM    Transition of Care Department [facility]) has reviewed patient and no TOC needs have been identified at this time. We will continue to monitor patient advancement through interdisciplinary progression rounds. If new patient transition needs arise, please place a TOC consult.

## 2022-03-09 NOTE — H&P (Signed)
History and Physical    Patient: Roberto Stanton:[id number] DOB: 1995/05/29 DOA: 03/09/2022 DOS: the patient was seen and examined on 03/09/2022 PCP: Rutherford Limerick, PA  Patient coming from: Home  Chief Complaint: No chief complaint on file.  HPI: Roberto Stanton is a 27 y.o. male with medical history significant of myophosphorylase deficiency (glycogen storage disease V, McArdle disease), with multiple hospitalizations for painful nontraumatic rhabdomyolysis most recently from 1/14 to 1/16 and then an ED visit to [facility] on 1/18 with CK in the 4000s,  who presents to the ED with continued diffuse muscle aches including back pain and chest pain similar to prior episodes of rhabdomyolysis.  Episodes can be triggered by daily benign routine activity.  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  States that during his childhood he would often cramp easily and gets sore after sporting activities or working out but was only diagnosed in June 2022 via muscle biopsy.  At the present time he rates his pain 10 out of 10. ED course and data review: BP 149/111 with pulse 121 and otherwise normal vitals.  CK 47,805, up from 3962 on 1/18 at [facility].  AST 269 and ALT 153.  EKG, independently viewed and interpreted showing sinus tach.  Patient treated with a 2 L LR bolus Dilaudid and Zofran and hospitalist consulted for admission.  Review of Systems: As mentioned in the history of present illness. All other systems reviewed and are negative. Past Medical History:  Diagnosis Date   Kidney stones    McArdle disease ([place]) 09/28/2021   Pericarditis    Rhabdomyolysis    History reviewed. No pertinent surgical history. Social History:  reports that he has quit smoking. He has never used smokeless tobacco. He reports that he does not currently use alcohol. He reports that he does not currently use drugs after having  used the following drugs: Marijuana.  No Known Allergies  Family History  Problem Relation Age of Onset   Heart disease Father     Prior to Admission medications   Medication Sig Start Date End Date Taking? Authorizing Provider  Oxycodone HCl 10 MG TABS Take 1 tablet (10 mg total) by mouth every 6 (six) hours as needed for up to 5 days. 03/06/22 03/11/22  Enzo Bi, MD    Physical Exam: Vitals:   03/09/22 0222 03/09/22 0231 03/09/22 0300 03/09/22 0354  BP: (!) 151/98  (!) 128/94 (!) 142/91  Pulse: (!) 115  (!) 112 (!) 120  Resp: 20  17 18   Temp:  98.8 F (37.1 C)  98.4 F (36.9 C)  TempSrc:  Oral    SpO2: 99%  99% 98%   Physical Exam Vitals and nursing note reviewed.  Constitutional:      General: He is not in acute distress. HENT:     Head: Normocephalic and atraumatic.  Cardiovascular:     Rate and Rhythm: Normal rate and regular rhythm.     Heart sounds: Normal heart sounds.  Pulmonary:     Effort: Pulmonary effort is normal.     Breath sounds: Normal breath sounds.  Abdominal:     Palpations: Abdomen is soft.     Tenderness: There is no abdominal tenderness.  Neurological:     Mental Status: Mental status is at baseline.     Data Reviewed: Relevant notes from primary care and specialist visits,  past discharge summaries as available in EHR, including Care Everywhere. Prior diagnostic testing as pertinent to current admission diagnoses Updated medications and problem lists for reconciliation ED course, including vitals, labs, imaging, treatment and response to treatment Triage notes, nursing and pharmacy notes and ED provider's notes Notable results as noted in HPI     Assessment and Plan:  Intractable pain Non-traumatic rhabdomyolysis, recurrent Myophosphorylase deficiency (glycogen storage disease V) McArdle disease CK 3962. > 47,805, with CK peaks to 90,000 in the past IV hydration Pain control  Rest  Transaminitis Secondary to  rhabdomyolysis Should improve with hydration   Advance Care Planning:   Code Status: Prior   Consults: none  Family Communication:   Severity of Illness: The appropriate patient status for this patient is OBSERVATION. Observation status is judged to be reasonable and necessary in order to provide the required intensity of service to ensure the patient's safety. The patient's presenting symptoms, physical exam findings, and initial radiographic and laboratory data in the context of their medical condition is felt to place them at decreased risk for further clinical deterioration. Furthermore, it is anticipated that the patient will be medically stable for discharge from the hospital within 2 midnights of admission.   Author: Athena Masse, MD 03/09/2022 4:11 AM  For on call review [email or web address].

## 2022-03-09 NOTE — Plan of Care (Signed)

## 2022-03-09 NOTE — Progress Notes (Signed)
PROGRESS NOTE  VIRGIE CHERY RCV:[id number] DOB: 1995/08/03 DOA: 03/09/2022 PCP: Gildardo Pounds, PA  Hospital Course/Subjective: This is a pleasant 27 year old gentleman with a history of myophosphorylase deficiency diagnosed in 2022 with multiple hospital admissions for painful nontraumatic rhabdomyolysis most recently from 1/14 to 1/16, who was admitted to the hospital 1/18 with recurrent diffuse muscle aches especially in his back and right thigh.  He has been admitted to the hospitalist service for pain control, and IV fluid management of his rhabdomyolysis.  Currently this morning he is resting in bed with IV fluids going, and controlled pain.  Assessment/Plan:  Principal Problem:   Rhabdomyolysis -continue IV fluids, avoid physical exertion, recheck renal function, electrolytes and CPK level in the morning Active Problems:   Transaminitis -secondary to rhabdomyolysis, will follow   Intractable pain   DVT Prophylaxis: Lovenox  Code Status: Full code  Family Communication: None present  Disposition Plan: Home when medically stable in 3 to 4 days  Antimicrobials: Anti-infectives (From admission, onward)    None       Objective: Vitals:   03/09/22 0300 03/09/22 0354 03/09/22 0422 03/09/22 0836  BP: (!) 128/94 (!) 142/91 (!) 142/91 111/79  Pulse: (!) 112 (!) 120 (!) 120 83  Resp: 17 18 18 20   Temp:  98.4 F (36.9 C) 98.4 F (36.9 C) 98.3 F (36.8 C)  TempSrc:   Oral Oral  SpO2: 99% 98%  100%  Weight:   66 kg   Height:   5\' 9"  (1.753 m)     Intake/Output Summary (Last 24 hours) at 03/09/2022 1339 Last data filed at 03/09/2022 0516 Gross per 24 hour  Intake 189.36 ml  Output 300 ml  Net -110.64 ml   Filed Weights   03/09/22 0422  Weight: 66 kg   Exam: General:  Alert, oriented, calm, in no acute distress Eyes: EOMI, clear sclerea Neck: supple, no masses, trachea mildline  Cardiovascular: RRR, no murmurs or rubs, no peripheral edema  Respiratory: clear  to auscultation bilaterally, no wheezes, no crackles  Abdomen: soft, nontender, nondistended, normal bowel tones heard  Skin: dry, no rashes  Musculoskeletal: no joint effusions, normal range of motion; he has some slight swelling and tenderness to palpation of the right thigh, no overlying skin lesions, rash or erythema Psychiatric: appropriate affect, normal speech  Neurologic: extraocular muscles intact, clear speech, moving all extremities with intact sensorium   Data Reviewed: CBC: Recent Labs  Lab 03/04/22 1130 03/05/22 0501 03/08/22 2345 03/09/22 0508  WBC 5.1 5.7 9.6 10.0  NEUTROABS 2.8  --  6.8  --   HGB 14.7 14.0 15.8 15.0  HCT 42.8 40.6 45.7 43.2  MCV 91.6 91.4 90.3 88.7  PLT 219 199 229 196   Basic Metabolic Panel: Recent Labs  Lab 03/04/22 1130 03/05/22 0501 03/06/22 1407 03/08/22 2345 03/09/22 0508  NA 136 136 136 133*  --   K 3.7 3.5 3.5 4.0  --   CL 100 102 101 99  --   CO2 30 27 27 23   --   GLUCOSE 88 95 96 94  --   BUN 18 10 8 18   --   CREATININE 0.84 0.59* 0.60* 0.81 0.79  CALCIUM 9.1 8.7* 9.0 9.0  --    GFR: Estimated Creatinine Clearance: 128.3 mL/min (by C-G formula based on SCr of 0.79 mg/dL). Liver Function Tests: Recent Labs  Lab 03/05/22 0501 03/06/22 1407 03/08/22 2345  AST 171* 98* 269*  ALT 121* 107* 153*  ALKPHOS  69 68 79  BILITOT 0.7 0.8 0.6  PROT 7.2 7.7 8.1  ALBUMIN 3.7 3.9 4.2   No results for input(s): "LIPASE", "AMYLASE" in the last 168 hours. No results for input(s): "AMMONIA" in the last 168 hours. Coagulation Profile: No results for input(s): "INR", "PROTIME" in the last 168 hours. Cardiac Enzymes: Recent Labs  Lab 03/04/22 1130 03/05/22 0501 03/06/22 1407 03/08/22 2345  CKTOTAL 25,642* 18,627* 5,877* 47,805*   BNP (last 3 results) No results for input(s): "PROBNP" in the last 8760 hours. HbA1C: No results for input(s): "HGBA1C" in the last 72 hours. CBG: No results for input(s): "GLUCAP" in the last 168  hours. Lipid Profile: No results for input(s): "CHOL", "HDL", "LDLCALC", "TRIG", "CHOLHDL", "LDLDIRECT" in the last 72 hours. Thyroid Function Tests: No results for input(s): "TSH", "T4TOTAL", "FREET4", "T3FREE", "THYROIDAB" in the last 72 hours. Anemia Panel: No results for input(s): "VITAMINB12", "FOLATE", "FERRITIN", "TIBC", "IRON", "RETICCTPCT" in the last 72 hours. Urine analysis:    Component Value Date/Time   COLORURINE AMBER (A) 03/09/2022 0112   APPEARANCEUR CLEAR (A) 03/09/2022 0112   LABSPEC 1.008 03/09/2022 0112   PHURINE 6.0 03/09/2022 0112   GLUCOSEU NEGATIVE 03/09/2022 0112   HGBUR LARGE (A) 03/09/2022 0112   BILIRUBINUR NEGATIVE 03/09/2022 0112   KETONESUR NEGATIVE 03/09/2022 0112   PROTEINUR 100 (A) 03/09/2022 0112   NITRITE NEGATIVE 03/09/2022 0112   LEUKOCYTESUR NEGATIVE 03/09/2022 0112   Sepsis Labs: @LABRCNTIP (procalcitonin:4,lacticidven:4)  ) Recent Results (from the past 240 hour(s))  Resp panel by RT-PCR (RSV, Flu A&B, Covid) Anterior Nasal Swab     Status: None   Collection Time: 03/04/22  1:46 PM   Specimen: Anterior Nasal Swab  Result Value Ref Range Status   SARS Coronavirus 2 by RT PCR NEGATIVE NEGATIVE Final    Comment: (NOTE) SARS-CoV-2 target nucleic acids are NOT DETECTED.  The SARS-CoV-2 RNA is generally detectable in upper respiratory specimens during the acute phase of infection. The lowest concentration of SARS-CoV-2 viral copies this assay can detect is 138 copies/mL. A negative result does not preclude SARS-Cov-2 infection and should not be used as the sole basis for treatment or other patient management decisions. A negative result may occur with  improper specimen collection/handling, submission of specimen other than nasopharyngeal swab, presence of viral mutation(s) within the areas targeted by this assay, and inadequate number of viral copies(<138 copies/mL). A negative result must be combined with clinical observations, patient  history, and epidemiological information. The expected result is Negative.  Fact Sheet for Patients:  EntrepreneurPulse.com.au  Fact Sheet for Healthcare Providers:  IncredibleEmployment.be  This test is no t yet approved or cleared by the [place] FDA and  has been authorized for detection and/or diagnosis of SARS-CoV-2 by FDA under an Emergency Use Authorization (EUA). This EUA will remain  in effect (meaning this test can be used) for the duration of the COVID-19 declaration under Section 564(b)(1) of the Act, 21 U.S.C.section 360bbb-3(b)(1), unless the authorization is terminated  or revoked sooner.       Influenza A by PCR NEGATIVE NEGATIVE Final   Influenza B by PCR NEGATIVE NEGATIVE Final    Comment: (NOTE) The Xpert Xpress SARS-CoV-2/FLU/RSV plus assay is intended as an aid in the diagnosis of influenza from Nasopharyngeal swab specimens and should not be used as a sole basis for treatment. Nasal washings and aspirates are unacceptable for Xpert Xpress SARS-CoV-2/FLU/RSV testing.  Fact Sheet for Patients: EntrepreneurPulse.com.au  Fact Sheet for Healthcare Providers: IncredibleEmployment.be  This test is  not yet approved or cleared by the [place] and has been authorized for detection and/or diagnosis of SARS-CoV-2 by FDA under an Emergency Use Authorization (EUA). This EUA will remain in effect (meaning this test can be used) for the duration of the COVID-19 declaration under Section 564(b)(1) of the Act, 21 U.S.C. section 360bbb-3(b)(1), unless the authorization is terminated or revoked.     Resp Syncytial Virus by PCR NEGATIVE NEGATIVE Final    Comment: (NOTE) Fact Sheet for Patients: EntrepreneurPulse.com.au  Fact Sheet for Healthcare Providers: IncredibleEmployment.be  This test is not yet approved or cleared by the [place] FDA  and has been authorized for detection and/or diagnosis of SARS-CoV-2 by FDA under an Emergency Use Authorization (EUA). This EUA will remain in effect (meaning this test can be used) for the duration of the COVID-19 declaration under Section 564(b)(1) of the Act, 21 U.S.C. section 360bbb-3(b)(1), unless the authorization is terminated or revoked.  Performed at [facility], [address] [place], [place] [address]   Group A Strep by PCR     Status: None   Collection Time: 03/04/22  6:17 PM   Specimen: Throat; Sterile Swab  Result Value Ref Range Status   Group A Strep by PCR NOT DETECTED NOT DETECTED Final    Comment: Performed at [facility], [address], [place], [place] [address]     Studies: No results found.  Scheduled Meds:  enoxaparin (LOVENOX) injection  40 mg Subcutaneous Q24H    Continuous Infusions:  lactated ringers 150 mL/hr at 03/09/22 1224     LOS: 0 days   Time spent: 11 minutes  Bransen Fassnacht Marry Guan, MD [facility] Pager [phone]  If 7PM-7AM, please contact night-coverage [email or web address] Password [email or web address] 03/09/2022, 1:39 PM

## 2022-03-09 NOTE — ED Provider Notes (Signed)
[facility] Provider Note    Event Date/Time   First MD Initiated Contact with Patient 03/09/22 0222     (approximate)   History   No chief complaint on file.   HPI  Roberto Stanton is a 27 y.o. 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  He frequently has admissions and visits to the emergency department due to pain and rhabdomyolysis.  He presents today with severe pain in his legs and dark urine.  No nausea, vomiting, nor diarrhea.  He reports that he went to the [facility] emergency department yesterday and was told that his CK was only about 3000 and that it was not high enough to be admitted.  However he said that he has gotten much worse over the last 12 hours or so and the pain is unbearable.  He thinks that his number will be much higher now.  He denies fever, chest pain, and shortness of breath.     Physical Exam   Triage Vital Signs: ED Triage Vitals  Enc Vitals Group     BP 03/08/22 2333 (!) 149/111     Pulse Rate 03/08/22 2333 (!) 121     Resp 03/08/22 2333 18     Temp 03/08/22 2333 98.3 F (36.8 C)     Temp Source 03/08/22 2333 Oral     SpO2 03/08/22 2333 98 %     Weight --      Height --      Head Circumference --      Peak Flow --      Pain Score 03/08/22 2342 10     Pain Loc --      Pain Edu? --      Excl. in [place]? --     Most recent vital signs: Vitals:   03/09/22 0222 03/09/22 0231  BP: (!) 151/98   Pulse: (!) 115   Resp: 20   Temp:  98.8 F (37.1 C)  SpO2: 99%      General: Awake, no distress.  Appears uncomfortable. CV:  Good peripheral perfusion.  Tachycardia, regular rhythm. Resp:  Normal effort.  Speaking easily and clearly, no accessory muscle usage. Abd:  No distention.  Generalized tenderness to palpation, no rebound or guarding. Other:  No visible  abnormalities of his legs.  No focal neurological deficits.   ED Results / Procedures / Treatments   Labs (all labs ordered are listed, but only abnormal results are displayed) Labs Reviewed  COMPREHENSIVE METABOLIC PANEL - Abnormal; Notable for the following components:      Result Value   Sodium 133 (*)    AST 269 (*)    ALT 153 (*)    All other components within normal limits  CK - Abnormal; Notable for the following components:   Total CK 47,805 (*)    All other components within normal limits  URINALYSIS, ROUTINE W REFLEX MICROSCOPIC - Abnormal; Notable for the following components:   Color, Urine AMBER (*)    APPearance CLEAR (*)    Hgb urine dipstick LARGE (*)    Protein, ur 100 (*)    All other components within normal limits  CBC WITH DIFFERENTIAL/PLATELET     EKG  ED ECG REPORT I, Hinda Kehr, the attending physician, personally viewed and interpreted this ECG.  Date: 03/08/2022 EKG Time: 23: 51 Rate: 112  Rhythm: Sinus tachycardia QRS Axis: normal Intervals: normal ST/T Wave abnormalities: normal Narrative Interpretation: no evidence of acute ischemia    RADIOLOGY No indication for emergent imaging    PROCEDURES:  Critical Care performed: No  .1-3 Lead EKG Interpretation  Performed by: Hinda Kehr, MD Authorized by: Hinda Kehr, MD     Interpretation: abnormal     ECG rate:  115   ECG rate assessment: tachycardic     Rhythm: sinus tachycardia     Ectopy: none     Conduction: normal      MEDICATIONS ORDERED IN ED: Medications  lactated ringers bolus 2,000 mL (2,000 mLs Intravenous New Bag/Given 03/09/22 0230)  HYDROmorphone (DILAUDID) injection 1 mg (1 mg Intravenous Given 03/09/22 0247)  ondansetron (ZOFRAN) injection 4 mg (4 mg Intravenous Given 03/09/22 0246)     IMPRESSION / MDM / ASSESSMENT AND PLAN / ED COURSE  I reviewed the triage vital signs and the nursing notes.                              Differential diagnosis  includes, but is not limited to, nontraumatic rhabdomyolysis, renal failure, other electrolyte or metabolic abnormality.  Patient's presentation is most consistent with acute presentation with potential threat to life or bodily function.  Lab/studies ordered: CMP, CBC with differential, CK, urinalysis.  Interventions ordered: LR 2 L IV bolus, Dilaudid 1 mg IV, Zofran 4 mg IV.  The patient is on the cardiac monitor to evaluate for evidence of arrhythmia and/or significant heart rate changes.  I verified in the medical history including a note from [facility] from 02/26/2022 that the patient has documented McArdle disease and is followed by a specialist at [facility].  He has had 7 ED visits in 6 months with 5 admissions.  I verified in the medical record that his CK yesterday (less than 12 hours ago) was approximately 4000.  Tonight his labs are notable for mild transaminitis which is common for him, but a total CK of nearly 48,000.  He has hemoglobinuria.  His renal function is currently normal.  He needs IV fluids and careful monitoring as well as pain control.  I suspect that his chronic medical condition has led to at least a degree of opioid dependence, but I have no doubt that he is in pain and suffering from a very challenging congenital abnormality (the glycogen storage disorder).  He will require admission and he agrees with the plan.   Clinical Course as of 03/09/22 0318  Fri Mar 09, 2022  0243 Consulting hospitalist for admission [CF]  0303 Consulted by phone with Dr. Damita Dunnings with the hospitalist service.  She understands and agrees with the plan and will admit. [CF]    Clinical Course User Index [CF] Hinda Kehr, MD     FINAL CLINICAL IMPRESSION(S) / ED DIAGNOSES   Final diagnoses:  Non-traumatic rhabdomyolysis  McArdle disease ([place])     Rx / DC Orders   ED Discharge Orders     None        Note:  This document was prepared using Dragon voice recognition software and may  include unintentional dictation errors.   Hinda Kehr, MD 03/09/22 [phone]

## 2022-03-10 ENCOUNTER — Other Ambulatory Visit: Payer: Self-pay

## 2022-03-10 DIAGNOSIS — M6282 Rhabdomyolysis: Secondary | ICD-10-CM | POA: Diagnosis not present

## 2022-03-10 LAB — BASIC METABOLIC PANEL
Anion gap: 7 (ref 5–15)
BUN: 10 mg/dL (ref 6–20)
CO2: 29 mmol/L (ref 22–32)
Calcium: 9.1 mg/dL (ref 8.9–10.3)
Chloride: 98 mmol/L (ref 98–111)
Creatinine, Ser: 0.65 mg/dL (ref 0.61–1.24)
GFR, Estimated: >60 mL/min (ref >60)
Glucose, Bld: 98 mg/dL (ref 70–99)
Potassium: 3.7 mmol/L (ref 3.5–5.1)
Sodium: 134 mmol/L — ABNORMAL LOW (ref 135–145)

## 2022-03-10 LAB — CK: Total CK: >50000 U/L — ABNORMAL HIGH (ref 49–397)

## 2022-03-10 MED ORDER — AMOXICILLIN-POT CLAVULANATE 400-57 MG/5ML PO SUSR
800.0000 mg | Freq: Two times a day (BID) | ORAL | Status: DC
  Administered 2022-03-10 – 2022-03-13 (×7): 800 mg via ORAL
  Filled 2022-03-10 (×7): qty 10

## 2022-03-10 NOTE — Plan of Care (Signed)

## 2022-03-10 NOTE — Progress Notes (Signed)
PROGRESS NOTE  Roberto Stanton OAC:[id number] DOB: Dec 16, 1995 DOA: 03/09/2022 PCP: [place] [place] Hospital Course/Subjective: This is a pleasant 27 year old gentleman with a history of myophosphorylase deficiency diagnosed in 2022 with multiple hospital admissions for painful nontraumatic rhabdomyolysis most recently from 1/14 to 1/16, who was admitted to the hospital 1/18 with recurrent diffuse muscle aches especially in his back and right thigh.  He has been admitted to the hospitalist service for pain control, and IV fluid management of his rhabdomyolysis. Tells me his urine is dark but he is urinating a lot. He mentions he has a known tooth abscess and is supposed to be on antibiotics for that, denies fevers, nausea.  Assessment/Plan:  Principal Problem:   Rhabdomyolysis -continue IV fluids increase to 200 cc/hr, avoid physical exertion, recheck renal function, electrolytes and CPK level once again in the morning Active Problems:  Tooth Abscess - augmentin oral suspension   Transaminitis -secondary to rhabdomyolysis, will follow   Intractable pain   DVT Prophylaxis: Lovenox  Code Status: Full code  Family Communication: None present  Disposition Plan: Home when medically stable in 3 to 4 days  Antimicrobials: Anti-infectives (From admission, onward)    Start     Dose/Rate Route Frequency Ordered Stop   03/10/22 1030  amoxicillin-clavulanate (AUGMENTIN) 400-57 MG/5ML suspension 400 mg       Note to Pharmacy: For dental carie   400 mg Oral Every 12 hours 03/10/22 0936         Objective: Vitals:   03/09/22 0836 03/09/22 1552 03/10/22 0021 03/10/22 0723  BP: 111/79 114/82 113/65 119/68  Pulse: 83 83 74 69  Resp: 20 16 18 16   Temp: 98.3 F (36.8 C) 97.9 F (36.6 C) 97.8 F (36.6 C) 98.3 F (36.8 C)  TempSrc: Oral Oral    SpO2: 100% 99% 98% 97%  Weight:      Height:        Intake/Output Summary (Last 24 hours) at 03/10/2022 0936 Last data filed at 03/10/2022  0109 Gross per 24 hour  Intake 3523.49 ml  Output 4200 ml  Net -676.51 ml    Filed Weights   03/09/22 0422  Weight: 66 kg   Exam: General:  Alert, oriented, calm, in no acute distress, resting in bed  Eyes: EOMI, clear conjuctivae, white sclerea Neck: supple, no masses, trachea mildline  Cardiovascular: RRR, no murmurs or rubs, no peripheral edema  Respiratory: clear to auscultation bilaterally, no wheezes, no crackles  Abdomen: soft, nontender, nondistended, normal bowel tones heard  Skin: dry, no rashes  Musculoskeletal: no joint effusions, normal range of motion  Psychiatric: appropriate affect, normal speech  Neurologic: extraocular muscles intact, clear speech, moving all extremities with intact sensorium    Data Reviewed: CBC: Recent Labs  Lab 03/04/22 1130 03/05/22 0501 03/08/22 2345 03/09/22 0508  WBC 5.1 5.7 9.6 10.0  NEUTROABS 2.8  --  6.8  --   HGB 14.7 14.0 15.8 15.0  HCT 42.8 40.6 45.7 43.2  MCV 91.6 91.4 90.3 88.7  PLT 219 199 229 323    Basic Metabolic Panel: Recent Labs  Lab 03/04/22 1130 03/05/22 0501 03/06/22 1407 03/08/22 2345 03/09/22 0508 03/10/22 0437  NA 136 136 136 133*  --  134*  K 3.7 3.5 3.5 4.0  --  3.7  CL 100 102 101 99  --  98  CO2 30 27 27 23   --  29  GLUCOSE 88 95 96 94  --  98  BUN 18  10 8 18   --  10  CREATININE 0.84 0.59* 0.60* 0.81 0.79 0.65  CALCIUM 9.1 8.7* 9.0 9.0  --  9.1    GFR: Estimated Creatinine Clearance: 128.3 mL/min (by C-G formula based on SCr of 0.65 mg/dL). Liver Function Tests: Recent Labs  Lab 03/05/22 0501 03/06/22 1407 03/08/22 2345  AST 171* 98* 269*  ALT 121* 107* 153*  ALKPHOS 69 68 79  BILITOT 0.7 0.8 0.6  PROT 7.2 7.7 8.1  ALBUMIN 3.7 3.9 4.2    No results for input(s): "LIPASE", "AMYLASE" in the last 168 hours. No results for input(s): "AMMONIA" in the last 168 hours. Coagulation Profile: No results for input(s): "INR", "PROTIME" in the last 168 hours. Cardiac Enzymes: Recent  Labs  Lab 03/04/22 1130 03/05/22 0501 03/06/22 1407 03/08/22 2345 03/10/22 0437  CKTOTAL 16,073* 18,627* 5,877* 47,805* >50,000*    BNP (last 3 results) No results for input(s): "PROBNP" in the last 8760 hours. HbA1C: No results for input(s): "HGBA1C" in the last 72 hours. CBG: No results for input(s): "GLUCAP" in the last 168 hours. Lipid Profile: No results for input(s): "CHOL", "HDL", "LDLCALC", "TRIG", "CHOLHDL", "LDLDIRECT" in the last 72 hours. Thyroid Function Tests: No results for input(s): "TSH", "T4TOTAL", "FREET4", "T3FREE", "THYROIDAB" in the last 72 hours. Anemia Panel: No results for input(s): "VITAMINB12", "FOLATE", "FERRITIN", "TIBC", "IRON", "RETICCTPCT" in the last 72 hours. Urine analysis:    Component Value Date/Time   COLORURINE AMBER (A) 03/09/2022 0112   APPEARANCEUR CLEAR (A) 03/09/2022 0112   LABSPEC 1.008 03/09/2022 0112   PHURINE 6.0 03/09/2022 0112   GLUCOSEU NEGATIVE 03/09/2022 0112   HGBUR LARGE (A) 03/09/2022 0112   BILIRUBINUR NEGATIVE 03/09/2022 0112   KETONESUR NEGATIVE 03/09/2022 0112   PROTEINUR 100 (A) 03/09/2022 0112   NITRITE NEGATIVE 03/09/2022 0112   LEUKOCYTESUR NEGATIVE 03/09/2022 0112   Sepsis Labs: @LABRCNTIP (procalcitonin:4,lacticidven:4)  ) Recent Results (from the past 240 hour(s))  Resp panel by RT-PCR (RSV, Flu A&B, Covid) Anterior Nasal Swab     Status: None   Collection Time: 03/04/22  1:46 PM   Specimen: Anterior Nasal Swab  Result Value Ref Range Status   SARS Coronavirus 2 by RT PCR NEGATIVE NEGATIVE Final    Comment: (NOTE) SARS-CoV-2 target nucleic acids are NOT DETECTED.  The SARS-CoV-2 RNA is generally detectable in upper respiratory specimens during the acute phase of infection. The lowest concentration of SARS-CoV-2 viral copies this assay can detect is 138 copies/mL. A negative result does not preclude SARS-Cov-2 infection and should not be used as the sole basis for treatment or other patient  management decisions. A negative result may occur with  improper specimen collection/handling, submission of specimen other than nasopharyngeal swab, presence of viral mutation(s) within the areas targeted by this assay, and inadequate number of viral copies(<138 copies/mL). A negative result must be combined with clinical observations, patient history, and epidemiological information. The expected result is Negative.  Fact Sheet for Patients:  EntrepreneurPulse.com.au  Fact Sheet for Healthcare Providers:  IncredibleEmployment.be  This test is no t yet approved or cleared by the [place] FDA and  has been authorized for detection and/or diagnosis of SARS-CoV-2 by FDA under an Emergency Use Authorization (EUA). This EUA will remain  in effect (meaning this test can be used) for the duration of the COVID-19 declaration under Section 564(b)(1) of the Act, 21 U.S.C.section 360bbb-3(b)(1), unless the authorization is terminated  or revoked sooner.       Influenza A by PCR NEGATIVE NEGATIVE Final  Influenza B by PCR NEGATIVE NEGATIVE Final    Comment: (NOTE) The Xpert Xpress SARS-CoV-2/FLU/RSV plus assay is intended as an aid in the diagnosis of influenza from Nasopharyngeal swab specimens and should not be used as a sole basis for treatment. Nasal washings and aspirates are unacceptable for Xpert Xpress SARS-CoV-2/FLU/RSV testing.  Fact Sheet for Patients: BloggerCourse.com  Fact Sheet for Healthcare Providers: SeriousBroker.it  This test is not yet approved or cleared by the [place] FDA and has been authorized for detection and/or diagnosis of SARS-CoV-2 by FDA under an Emergency Use Authorization (EUA). This EUA will remain in effect (meaning this test can be used) for the duration of the COVID-19 declaration under Section 564(b)(1) of the Act, 21 U.S.C. section 360bbb-3(b)(1),  unless the authorization is terminated or revoked.     Resp Syncytial Virus by PCR NEGATIVE NEGATIVE Final    Comment: (NOTE) Fact Sheet for Patients: BloggerCourse.com  Fact Sheet for Healthcare Providers: SeriousBroker.it  This test is not yet approved or cleared by the [place] FDA and has been authorized for detection and/or diagnosis of SARS-CoV-2 by FDA under an Emergency Use Authorization (EUA). This EUA will remain in effect (meaning this test can be used) for the duration of the COVID-19 declaration under Section 564(b)(1) of the Act, 21 U.S.C. section 360bbb-3(b)(1), unless the authorization is terminated or revoked.  Performed at [facility], [address] [place], [place] [address]   Group A Strep by PCR     Status: None   Collection Time: 03/04/22  6:17 PM   Specimen: Throat; Sterile Swab  Result Value Ref Range Status   Group A Strep by PCR NOT DETECTED NOT DETECTED Final    Comment: Performed at [facility], [address], [place], [place] [address]     Studies: No results found.  Scheduled Meds:  amoxicillin-clavulanate  400 mg Oral Q12H   enoxaparin (LOVENOX) injection  40 mg Subcutaneous Q24H    Continuous Infusions:  lactated ringers 150 mL/hr at 03/10/22 0929     LOS: 1 day   Time spent: 11 minutes  Baljit Liebert Vergie Living, MD [facility] Pager [phone]  If 7PM-7AM, please contact night-coverage [email or web address] Password [facility] 03/10/2022, 9:36 AM

## 2022-03-11 DIAGNOSIS — M6282 Rhabdomyolysis: Secondary | ICD-10-CM | POA: Diagnosis not present

## 2022-03-11 LAB — BASIC METABOLIC PANEL
Anion gap: 9 (ref 5–15)
BUN: 9 mg/dL (ref 6–20)
CO2: 28 mmol/L (ref 22–32)
Calcium: 9.4 mg/dL (ref 8.9–10.3)
Chloride: 101 mmol/L (ref 98–111)
Creatinine, Ser: 0.57 mg/dL — ABNORMAL LOW (ref 0.61–1.24)
GFR, Estimated: >60 mL/min (ref >60)
Glucose, Bld: 101 mg/dL — ABNORMAL HIGH (ref 70–99)
Potassium: 3.7 mmol/L (ref 3.5–5.1)
Sodium: 138 mmol/L (ref 135–145)

## 2022-03-11 LAB — CK: Total CK: >50000 U/L — ABNORMAL HIGH (ref 49–397)

## 2022-03-11 MED ORDER — HYDROMORPHONE HCL 2 MG PO TABS
2.0000 mg | ORAL_TABLET | ORAL | Status: DC | PRN
  Administered 2022-03-11 – 2022-03-12 (×6): 2 mg via ORAL
  Filled 2022-03-11 (×7): qty 1

## 2022-03-11 MED ORDER — HYDROMORPHONE HCL 1 MG/ML IJ SOLN
1.0000 mg | INTRAMUSCULAR | Status: DC | PRN
  Administered 2022-03-11 – 2022-03-13 (×12): 1 mg via INTRAVENOUS
  Filled 2022-03-11 (×12): qty 1

## 2022-03-11 MED ORDER — HYDROMORPHONE 1 MG/ML IV SOLN
INTRAVENOUS | Status: DC
  Administered 2022-03-11: 30 mg via INTRAVENOUS
  Filled 2022-03-11 (×5): qty 30

## 2022-03-11 MED ORDER — NALOXONE HCL 0.4 MG/ML IJ SOLN: 0.4000 mg | INTRAMUSCULAR | Status: DC | PRN

## 2022-03-11 MED ORDER — SODIUM CHLORIDE 0.9% FLUSH: 9.0000 mL | INTRAVENOUS | Status: DC | PRN

## 2022-03-11 NOTE — Progress Notes (Signed)
CROSS COVER NOTE  NAME: Roberto Stanton MRN: [id number] DOB : 01/19/1996    HPI/Events of Note   Nurse reports patient pain poorly controlled with current regimen. Needs something continuous  Assessment and  Interventions   Assessment: Discussed with Dr Damita Dunnings best management with his muscle pain Plan: Dilaudid pca ordered       Kathlene Cote NP [facility]

## 2022-03-11 NOTE — Progress Notes (Addendum)
PROGRESS NOTE  Roberto Stanton GNF:[id number] DOB: 1995/03/09 DOA: 03/09/2022 PCP: Gildardo Pounds, PA  Hospital Course/Subjective: This is a pleasant 27 year old gentleman with a history of myophosphorylase deficiency diagnosed in 2022 with multiple hospital admissions for painful nontraumatic rhabdomyolysis most recently from 1/14 to 1/16, who was admitted to the hospital 1/18 with recurrent diffuse muscle aches especially in his back and right thigh.  He has been admitted to the hospitalist service for pain control, and IV fluid management of his rhabdomyolysis.  Due to uncontrolled pain, he was started on Dilaudid PCA overnight on 1/21.  Transitioned back to PO and IV dosing on 1/22. His pain is well controlled and continues to have good UOP.  Assessment/Plan:  Principal Problem:   Rhabdomyolysis -continue IV fluids increased to 200 cc/hr, avoid physical exertion, recheck renal function, electrolytes and CPK level once again in the morning.  CPK finally coming down. Active Problems:  Tooth Abscess - augmentin oral suspension   Transaminitis -secondary to rhabdomyolysis, will follow   Intractable pain   DVT Prophylaxis: Lovenox  Code Status: Full code  Family Communication: None present  Disposition Plan: Home when medically stable in 1-3 days.  Antimicrobials: Anti-infectives (From admission, onward)    Start     Dose/Rate Route Frequency Ordered Stop   03/10/22 1030  amoxicillin-clavulanate (AUGMENTIN) 400-57 MG/5ML suspension 800 mg       Note to Pharmacy: For dental carie   800 mg Oral Every 12 hours 03/10/22 0936         Objective: Vitals:   03/11/22 0559 03/11/22 0631 03/11/22 0704 03/11/22 0835  BP: (!) 129/91   114/74  Pulse: 88   73  Resp: 14 14 13 16   Temp:    98.2 F (36.8 C)  TempSrc:      SpO2: 98% 98% 98% 100%  Weight:      Height:        Intake/Output Summary (Last 24 hours) at 03/11/2022 1107 Last data filed at 03/11/2022 1105 Gross per 24 hour   Intake 4668.64 ml  Output --  Net 4668.64 ml    Filed Weights   03/09/22 0422  Weight: 66 kg   Exam: General:  Alert, oriented, calm, in no acute distress  Eyes: EOMI, clear conjuctivae, white sclerea Neck: supple, no masses, trachea mildline  Cardiovascular: RRR, no murmurs or rubs, no peripheral edema  Respiratory: clear to auscultation bilaterally, no wheezes, no crackles  Abdomen: soft, nontender, nondistended, normal bowel tones heard  Skin: dry, no rashes  Musculoskeletal: no joint effusions, normal range of motion  Psychiatric: appropriate affect, normal speech  Neurologic: extraocular muscles intact, clear speech, moving all extremities with intact sensorium  Data Reviewed: CBC: Recent Labs  Lab 03/04/22 1130 03/05/22 0501 03/08/22 2345 03/09/22 0508  WBC 5.1 5.7 9.6 10.0  NEUTROABS 2.8  --  6.8  --   HGB 14.7 14.0 15.8 15.0  HCT 42.8 40.6 45.7 43.2  MCV 91.6 91.4 90.3 88.7  PLT 219 199 229 196    Basic Metabolic Panel: Recent Labs  Lab 03/05/22 0501 03/06/22 1407 03/08/22 2345 03/09/22 0508 03/10/22 0437 03/11/22 0434  NA 136 136 133*  --  134* 138  K 3.5 3.5 4.0  --  3.7 3.7  CL 102 101 99  --  98 101  CO2 27 27 23   --  29 28  GLUCOSE 95 96 94  --  98 101*  BUN 10 8 18   --  10 9  CREATININE 0.59* 0.60* 0.81 0.79 0.65 0.57*  CALCIUM 8.7* 9.0 9.0  --  9.1 9.4    GFR: Estimated Creatinine Clearance: 128.3 mL/min (A) (by C-G formula based on SCr of 0.57 mg/dL (L)). Liver Function Tests: Recent Labs  Lab 03/05/22 0501 03/06/22 1407 03/08/22 2345  AST 171* 98* 269*  ALT 121* 107* 153*  ALKPHOS 69 68 79  BILITOT 0.7 0.8 0.6  PROT 7.2 7.7 8.1  ALBUMIN 3.7 3.9 4.2    No results for input(s): "LIPASE", "AMYLASE" in the last 168 hours. No results for input(s): "AMMONIA" in the last 168 hours. Coagulation Profile: No results for input(s): "INR", "PROTIME" in the last 168 hours. Cardiac Enzymes: Recent Labs  Lab 03/05/22 0501  03/06/22 1407 03/08/22 2345 03/10/22 0437 03/11/22 0434  CKTOTAL 18,627* 5,877* 47,805* >50,000* >50,000*    BNP (last 3 results) No results for input(s): "PROBNP" in the last 8760 hours. HbA1C: No results for input(s): "HGBA1C" in the last 72 hours. CBG: No results for input(s): "GLUCAP" in the last 168 hours. Lipid Profile: No results for input(s): "CHOL", "HDL", "LDLCALC", "TRIG", "CHOLHDL", "LDLDIRECT" in the last 72 hours. Thyroid Function Tests: No results for input(s): "TSH", "T4TOTAL", "FREET4", "T3FREE", "THYROIDAB" in the last 72 hours. Anemia Panel: No results for input(s): "VITAMINB12", "FOLATE", "FERRITIN", "TIBC", "IRON", "RETICCTPCT" in the last 72 hours. Urine analysis:    Component Value Date/Time   COLORURINE AMBER (A) 03/09/2022 0112   APPEARANCEUR CLEAR (A) 03/09/2022 0112   LABSPEC 1.008 03/09/2022 0112   PHURINE 6.0 03/09/2022 0112   GLUCOSEU NEGATIVE 03/09/2022 0112   HGBUR LARGE (A) 03/09/2022 0112   BILIRUBINUR NEGATIVE 03/09/2022 0112   KETONESUR NEGATIVE 03/09/2022 0112   PROTEINUR 100 (A) 03/09/2022 0112   NITRITE NEGATIVE 03/09/2022 0112   LEUKOCYTESUR NEGATIVE 03/09/2022 0112   Sepsis Labs: @LABRCNTIP (procalcitonin:4,lacticidven:4)  ) Recent Results (from the past 240 hour(s))  Resp panel by RT-PCR (RSV, Flu A&B, Covid) Anterior Nasal Swab     Status: None   Collection Time: 03/04/22  1:46 PM   Specimen: Anterior Nasal Swab  Result Value Ref Range Status   SARS Coronavirus 2 by RT PCR NEGATIVE NEGATIVE Final    Comment: (NOTE) SARS-CoV-2 target nucleic acids are NOT DETECTED.  The SARS-CoV-2 RNA is generally detectable in upper respiratory specimens during the acute phase of infection. The lowest concentration of SARS-CoV-2 viral copies this assay can detect is 138 copies/mL. A negative result does not preclude SARS-Cov-2 infection and should not be used as the sole basis for treatment or other patient management decisions. A negative  result may occur with  improper specimen collection/handling, submission of specimen other than nasopharyngeal swab, presence of viral mutation(s) within the areas targeted by this assay, and inadequate number of viral copies(<138 copies/mL). A negative result must be combined with clinical observations, patient history, and epidemiological information. The expected result is Negative.  Fact Sheet for Patients:  EntrepreneurPulse.com.au  Fact Sheet for Healthcare Providers:  IncredibleEmployment.be  This test is no t yet approved or cleared by the [place] FDA and  has been authorized for detection and/or diagnosis of SARS-CoV-2 by FDA under an Emergency Use Authorization (EUA). This EUA will remain  in effect (meaning this test can be used) for the duration of the COVID-19 declaration under Section 564(b)(1) of the Act, 21 U.S.C.section 360bbb-3(b)(1), unless the authorization is terminated  or revoked sooner.       Influenza A by PCR NEGATIVE NEGATIVE Final   Influenza B by PCR  NEGATIVE NEGATIVE Final    Comment: (NOTE) The Xpert Xpress SARS-CoV-2/FLU/RSV plus assay is intended as an aid in the diagnosis of influenza from Nasopharyngeal swab specimens and should not be used as a sole basis for treatment. Nasal washings and aspirates are unacceptable for Xpert Xpress SARS-CoV-2/FLU/RSV testing.  Fact Sheet for Patients: EntrepreneurPulse.com.au  Fact Sheet for Healthcare Providers: IncredibleEmployment.be  This test is not yet approved or cleared by the [place] FDA and has been authorized for detection and/or diagnosis of SARS-CoV-2 by FDA under an Emergency Use Authorization (EUA). This EUA will remain in effect (meaning this test can be used) for the duration of the COVID-19 declaration under Section 564(b)(1) of the Act, 21 U.S.C. section 360bbb-3(b)(1), unless the authorization is  terminated or revoked.     Resp Syncytial Virus by PCR NEGATIVE NEGATIVE Final    Comment: (NOTE) Fact Sheet for Patients: EntrepreneurPulse.com.au  Fact Sheet for Healthcare Providers: IncredibleEmployment.be  This test is not yet approved or cleared by the [place] FDA and has been authorized for detection and/or diagnosis of SARS-CoV-2 by FDA under an Emergency Use Authorization (EUA). This EUA will remain in effect (meaning this test can be used) for the duration of the COVID-19 declaration under Section 564(b)(1) of the Act, 21 U.S.C. section 360bbb-3(b)(1), unless the authorization is terminated or revoked.  Performed at [facility], [address] [place], [place] [address]   Group A Strep by PCR     Status: None   Collection Time: 03/04/22  6:17 PM   Specimen: Throat; Sterile Swab  Result Value Ref Range Status   Group A Strep by PCR NOT DETECTED NOT DETECTED Final    Comment: Performed at [facility], [address] [place], [place] [address]     Studies: No results found.  Scheduled Meds:  amoxicillin-clavulanate  800 mg Oral Q12H   enoxaparin (LOVENOX) injection  40 mg Subcutaneous Q24H   HYDROmorphone   Intravenous Q4H    Continuous Infusions:  lactated ringers 200 mL/hr at 03/11/22 1105    LOS: 2 days   Time spent: 19 minutes  Dianely Krehbiel Marry Guan, MD [facility] Pager [phone]  If 7PM-7AM, please contact night-coverage [email or web address] Password [facility] 03/11/2022, 11:07 AM

## 2022-03-11 NOTE — Plan of Care (Signed)

## 2022-03-12 DIAGNOSIS — M6282 Rhabdomyolysis: Secondary | ICD-10-CM | POA: Diagnosis not present

## 2022-03-12 LAB — BASIC METABOLIC PANEL
Anion gap: 8 (ref 5–15)
BUN: 11 mg/dL (ref 6–20)
CO2: 27 mmol/L (ref 22–32)
Calcium: 9 mg/dL (ref 8.9–10.3)
Chloride: 101 mmol/L (ref 98–111)
Creatinine, Ser: 0.56 mg/dL — ABNORMAL LOW (ref 0.61–1.24)
GFR, Estimated: >60 mL/min (ref >60)
Glucose, Bld: 95 mg/dL (ref 70–99)
Potassium: 3.6 mmol/L (ref 3.5–5.1)
Sodium: 136 mmol/L (ref 135–145)

## 2022-03-12 LAB — CK: Total CK: 34867 U/L — ABNORMAL HIGH (ref 49–397)

## 2022-03-12 NOTE — Progress Notes (Signed)
PROGRESS NOTE  Roberto Stanton GLO:[id number] DOB: 1995-11-28 DOA: 03/09/2022 PCP: [place], [place] Hospital Course/Subjective: This is a pleasant 27 year old gentleman with a history of myophosphorylase deficiency diagnosed in 2022 with multiple hospital admissions for painful nontraumatic rhabdomyolysis most recently from 1/14 to 1/16, who was admitted to the hospital 1/18 with recurrent diffuse muscle aches especially in his back and right thigh.  He has been admitted to the hospitalist service for pain control, and IV fluid management of his rhabdomyolysis.  Due to uncontrolled pain, he was started on Dilaudid PCA overnight on 1/21.  Transitioned back to PO and IV dosing on 1/22. His pain is well controlled and continues to have good UOP.  Assessment/Plan:  Principal Problem:   Rhabdomyolysis -continue IV fluids increased to 200 cc/hr, avoid physical exertion, recheck renal function, electrolytes and CPK level once again in the morning.  CPK finally coming down. Active Problems:  Tooth Abscess - augmentin oral suspension   Transaminitis -secondary to rhabdomyolysis, will follow   Intractable pain   DVT Prophylaxis: Lovenox  Code Status: Full code  Family Communication: None present  Disposition Plan: Home when medically stable in 1-3 days.  Antimicrobials: Anti-infectives (From admission, onward)    Start     Dose/Rate Route Frequency Ordered Stop   03/10/22 1030  amoxicillin-clavulanate (AUGMENTIN) 400-57 MG/5ML suspension 800 mg       Note to Pharmacy: For dental carie   800 mg Oral Every 12 hours 03/10/22 0936         Objective: Vitals:   03/11/22 1556 03/11/22 1722 03/12/22 0007 03/12/22 0828  BP:  105/68 108/70 115/75  Pulse:  76 83 75  Resp: 20 16 20 16   Temp:  98.1 F (36.7 C) 98 F (36.7 C) 97.8 F (36.6 C)  TempSrc:      SpO2: 98% 100% 100% 100%  Weight:      Height:        Intake/Output Summary (Last 24 hours) at 03/12/2022 0958 Last data filed  at 03/12/2022 0300 Gross per 24 hour  Intake 5613.41 ml  Output --  Net 5613.41 ml    Filed Weights   03/09/22 0422  Weight: 66 kg   Exam: General:  Alert, oriented, calm, in no acute distress  Eyes: EOMI, clear conjuctivae, white sclerea Neck: supple, no masses, trachea mildline  Cardiovascular: RRR, no murmurs or rubs, no peripheral edema  Respiratory: clear to auscultation bilaterally, no wheezes, no crackles  Abdomen: soft, nontender, nondistended, normal bowel tones heard  Skin: dry, no rashes  Musculoskeletal: no joint effusions, normal range of motion  Psychiatric: appropriate affect, normal speech  Neurologic: extraocular muscles intact, clear speech, moving all extremities with intact sensorium  Data Reviewed: CBC: Recent Labs  Lab 03/08/22 2345 03/09/22 0508  WBC 9.6 10.0  NEUTROABS 6.8  --   HGB 15.8 15.0  HCT 45.7 43.2  MCV 90.3 88.7  PLT 229 188    Basic Metabolic Panel: Recent Labs  Lab 03/06/22 1407 03/08/22 2345 03/09/22 0508 03/10/22 0437 03/11/22 0434 03/12/22 0339  NA 136 133*  --  134* 138 136  K 3.5 4.0  --  3.7 3.7 3.6  CL 101 99  --  98 101 101  CO2 27 23  --  29 28 27   GLUCOSE 96 94  --  98 101* 95  BUN 8 18  --  10 9 11   CREATININE 0.60* 0.81 0.79 0.65 0.57* 0.56*  CALCIUM 9.0 9.0  --  9.1 9.4 9.0    GFR: Estimated Creatinine Clearance: 128.3 mL/min (A) (by C-G formula based on SCr of 0.56 mg/dL (L)). Liver Function Tests: Recent Labs  Lab 03/06/22 1407 03/08/22 2345  AST 98* 269*  ALT 107* 153*  ALKPHOS 68 79  BILITOT 0.8 0.6  PROT 7.7 8.1  ALBUMIN 3.9 4.2    No results for input(s): "LIPASE", "AMYLASE" in the last 168 hours. No results for input(s): "AMMONIA" in the last 168 hours. Coagulation Profile: No results for input(s): "INR", "PROTIME" in the last 168 hours. Cardiac Enzymes: Recent Labs  Lab 03/06/22 1407 03/08/22 2345 03/10/22 0437 03/11/22 0434 03/12/22 0339  CKTOTAL 5,877* 47,805* >50,000* >50,000*  16,109*    BNP (last 3 results) No results for input(s): "PROBNP" in the last 8760 hours. HbA1C: No results for input(s): "HGBA1C" in the last 72 hours. CBG: No results for input(s): "GLUCAP" in the last 168 hours. Lipid Profile: No results for input(s): "CHOL", "HDL", "LDLCALC", "TRIG", "CHOLHDL", "LDLDIRECT" in the last 72 hours. Thyroid Function Tests: No results for input(s): "TSH", "T4TOTAL", "FREET4", "T3FREE", "THYROIDAB" in the last 72 hours. Anemia Panel: No results for input(s): "VITAMINB12", "FOLATE", "FERRITIN", "TIBC", "IRON", "RETICCTPCT" in the last 72 hours. Urine analysis:    Component Value Date/Time   COLORURINE AMBER (A) 03/09/2022 0112   APPEARANCEUR CLEAR (A) 03/09/2022 0112   LABSPEC 1.008 03/09/2022 0112   PHURINE 6.0 03/09/2022 0112   GLUCOSEU NEGATIVE 03/09/2022 0112   HGBUR LARGE (A) 03/09/2022 0112   BILIRUBINUR NEGATIVE 03/09/2022 0112   KETONESUR NEGATIVE 03/09/2022 0112   PROTEINUR 100 (A) 03/09/2022 0112   NITRITE NEGATIVE 03/09/2022 0112   LEUKOCYTESUR NEGATIVE 03/09/2022 0112   Sepsis Labs: @LABRCNTIP (procalcitonin:4,lacticidven:4)  ) Recent Results (from the past 240 hour(s))  Resp panel by RT-PCR (RSV, Flu A&B, Covid) Anterior Nasal Swab     Status: None   Collection Time: 03/04/22  1:46 PM   Specimen: Anterior Nasal Swab  Result Value Ref Range Status   SARS Coronavirus 2 by RT PCR NEGATIVE NEGATIVE Final    Comment: (NOTE) SARS-CoV-2 target nucleic acids are NOT DETECTED.  The SARS-CoV-2 RNA is generally detectable in upper respiratory specimens during the acute phase of infection. The lowest concentration of SARS-CoV-2 viral copies this assay can detect is 138 copies/mL. A negative result does not preclude SARS-Cov-2 infection and should not be used as the sole basis for treatment or other patient management decisions. A negative result may occur with  improper specimen collection/handling, submission of specimen other than  nasopharyngeal swab, presence of viral mutation(s) within the areas targeted by this assay, and inadequate number of viral copies(<138 copies/mL). A negative result must be combined with clinical observations, patient history, and epidemiological information. The expected result is Negative.  Fact Sheet for Patients:  03/06/22  Fact Sheet for Healthcare Providers:  BloggerCourse.com  This test is no t yet approved or cleared by the [place] FDA and  has been authorized for detection and/or diagnosis of SARS-CoV-2 by FDA under an Emergency Use Authorization (EUA). This EUA will remain  in effect (meaning this test can be used) for the duration of the COVID-19 declaration under Section 564(b)(1) of the Act, 21 U.S.C.section 360bbb-3(b)(1), unless the authorization is terminated  or revoked sooner.       Influenza A by PCR NEGATIVE NEGATIVE Final   Influenza B by PCR NEGATIVE NEGATIVE Final    Comment: (NOTE) The Xpert Xpress SARS-CoV-2/FLU/RSV plus assay is intended as an aid in the diagnosis  of influenza from Nasopharyngeal swab specimens and should not be used as a sole basis for treatment. Nasal washings and aspirates are unacceptable for Xpert Xpress SARS-CoV-2/FLU/RSV testing.  Fact Sheet for Patients: EntrepreneurPulse.com.au  Fact Sheet for Healthcare Providers: IncredibleEmployment.be  This test is not yet approved or cleared by the [place] FDA and has been authorized for detection and/or diagnosis of SARS-CoV-2 by FDA under an Emergency Use Authorization (EUA). This EUA will remain in effect (meaning this test can be used) for the duration of the COVID-19 declaration under Section 564(b)(1) of the Act, 21 U.S.C. section 360bbb-3(b)(1), unless the authorization is terminated or revoked.     Resp Syncytial Virus by PCR NEGATIVE NEGATIVE Final    Comment:  (NOTE) Fact Sheet for Patients: EntrepreneurPulse.com.au  Fact Sheet for Healthcare Providers: IncredibleEmployment.be  This test is not yet approved or cleared by the [place] FDA and has been authorized for detection and/or diagnosis of SARS-CoV-2 by FDA under an Emergency Use Authorization (EUA). This EUA will remain in effect (meaning this test can be used) for the duration of the COVID-19 declaration under Section 564(b)(1) of the Act, 21 U.S.C. section 360bbb-3(b)(1), unless the authorization is terminated or revoked.  Performed at [facility], [address] [place], [place] [address]   Group A Strep by PCR     Status: None   Collection Time: 03/04/22  6:17 PM   Specimen: Throat; Sterile Swab  Result Value Ref Range Status   Group A Strep by PCR NOT DETECTED NOT DETECTED Final    Comment: Performed at [facility], [address] [place], [place] [address]     Studies: No results found.  Scheduled Meds:  amoxicillin-clavulanate  800 mg Oral Q12H   enoxaparin (LOVENOX) injection  40 mg Subcutaneous Q24H    Continuous Infusions:  lactated ringers 200 mL/hr at 03/12/22 0356    LOS: 3 days   Time spent: 19 minutes  Emaya Preston Marry Guan, MD [facility] Pager [phone]  If 7PM-7AM, please contact night-coverage [email or web address] Password [facility] 03/12/2022, 9:58 AM

## 2022-03-12 NOTE — Plan of Care (Signed)

## 2022-03-12 NOTE — TOC Progression Note (Signed)
Transition of Care [facility]) - Progression Note    Patient Details  Name: Roberto Stanton MRN: [id number] Date of Birth: December 21, 1995  Transition of [facility]) CM/SW Ina, LCSW Phone Number: 03/12/2022, 10:38 AM  Clinical Narrative:   CSW reviewed chart for TOC needs.  None at this time.  Will continue to follow for discharge planning needs.        Expected Discharge Plan and Services                                               Social Determinants of Health (SDOH) Interventions SDOH Screenings   Food Insecurity: No Food Insecurity (03/09/2022)  Housing: Low Risk  (03/09/2022)  Transportation Needs: No Transportation Needs (03/09/2022)  Utilities: Not At Risk (03/09/2022)  Tobacco Use: Medium Risk (03/09/2022)    Readmission Risk Interventions     No data to display

## 2022-03-13 DIAGNOSIS — M6282 Rhabdomyolysis: Secondary | ICD-10-CM | POA: Diagnosis not present

## 2022-03-13 LAB — BASIC METABOLIC PANEL
Anion gap: 8 (ref 5–15)
BUN: 10 mg/dL (ref 6–20)
CO2: 27 mmol/L (ref 22–32)
Calcium: 8.9 mg/dL (ref 8.9–10.3)
Chloride: 101 mmol/L (ref 98–111)
Creatinine, Ser: 0.56 mg/dL — ABNORMAL LOW (ref 0.61–1.24)
GFR, Estimated: >60 mL/min (ref >60)
Glucose, Bld: 94 mg/dL (ref 70–99)
Potassium: 3.6 mmol/L (ref 3.5–5.1)
Sodium: 136 mmol/L (ref 135–145)

## 2022-03-13 LAB — CK: Total CK: 20296 U/L — ABNORMAL HIGH (ref 49–397)

## 2022-03-13 MED ORDER — OXYCODONE HCL 10 MG PO TABS: 10.0000 mg | ORAL_TABLET | ORAL | 0 refills | Status: AC | PRN

## 2022-03-13 MED ORDER — AMOXICILLIN-POT CLAVULANATE 400-57 MG/5ML PO SUSR: 800.0000 mg | Freq: Two times a day (BID) | ORAL | 0 refills | Status: AC

## 2022-03-13 NOTE — Progress Notes (Signed)
1127 D/C AVS completed and reviewed with pt. All opportunities for questions answered and clarified. IV removed. Pt will be wheeled down to car at medical mall entrance via wheelchair.

## 2022-03-13 NOTE — Plan of Care (Signed)
  Problem: Education: Goal: Knowledge of General Education information will improve Description: Including pain rating scale, medication(s)/side effects and non-pharmacologic comfort measures Outcome: Progressing   Problem: Activity: Goal: Risk for activity intolerance will decrease Outcome: Progressing   Problem: Pain Managment: Goal: General experience of comfort will improve Outcome: Progressing

## 2022-03-13 NOTE — Discharge Summary (Signed)
Discharge Summary  Roberto Stanton JKD:[id number] DOB: 02-15-1996  PCP: Rutherford Limerick, PA  Admit date: 03/09/2022 Discharge date: 03/13/2022   Recommendations for Outpatient Follow-up:  Please follow up with your PCP with CBC, BMP and CPK level in 1-2 weeks.  Discharge Diagnoses:  Active Hospital Problems   Diagnosis Date Noted   Rhabdomyolysis 06/08/2021    Priority: 1.   Transaminitis 09/26/2021    Priority: 2.   Intractable pain 03/09/2022    Resolved Hospital Problems  No resolved problems to display.   Discharge Condition: Stable   Diet recommendation: Diet Orders (From admission, onward)     Start     Ordered   03/09/22 0416  Diet regular Room service appropriate? Yes; Fluid consistency: Thin  Diet effective now       Question Answer Comment  Room service appropriate? Yes   Fluid consistency: Thin      03/09/22 0417           HPI and Brief Hospital Course:  This is a pleasant 27 year old gentleman with a history of mild phosphorylase deficiency diagnosed in 2022 who has a prior history of multiple hospital admissions for painful nontraumatic rhabdomyolysis most recently from 1/14 to 1/16 who was admitted to the hospital 1/18 with recurrent diffuse muscle aches in his back and thigh.  He was admitted to the hospitalist service for pain control and IV fluid management of his rhabdomyolysis.  Due to uncontrolled pain, he was started on Dilaudid PCA overnight on 1/21 and transition back to p.o. and IV dosing of pain medication on 1/22.  He was admitted to the hospitalist service, given copious IV fluids, despite which his CPK level initially continued to rise, was greater than 50,000 for a couple of days, yesterday reduced down to 34,000, is 20,000 today.  He is feeling much better.  The patient is quite insistent that he wants to be discharged from the hospital today.  I explained to him that his CPK levels are still significantly elevated, but he feels confident that  since his pain is better, he is making good urine with clear yellow urine, he can continue to hydrate aggressively and wants to be discharged from the hospital.  It is true that he was discharged from the hospital previously with CPK around this level, and stayed out of the hospital for a couple of months.  He is supposed to travel to [place] soon, to be married in a few days.  I discussed with the patient that he is at very high risk for worsening nontraumatic rhabdomyolysis, requiring readmission to the hospital soon.  However I do also feel that the patient is quite reliable, understands his disease process.  He will do his best to hydrate aggressively, and understands that he needs labs checked within the next week.  He also understands to return to the hospital or seek medical care if he has significant worsening of his muscle pain, or starts having dark or reduced urine output.  Discharge details, plan of care and follow up instructions were discussed with patient. Patient is requesting discharge from the hospital today and all questions were answered to their satisfaction.  Discharge Exam: BP 102/68 (BP Location: Right Arm)   Pulse 81   Temp 97.8 F (36.6 C)   Resp 17   Ht 5\' 9"  (1.753 m)   Wt 66 kg   SpO2 100%   BMI 21.49 kg/m  General:  Alert, oriented, calm, in no acute distress, resting comfortably this  morning in his room Eyes: EOMI, clear sclerea Neck: supple, no masses, trachea mildline  Cardiovascular: RRR, no murmurs or rubs, no peripheral edema  Respiratory: clear to auscultation bilaterally, no wheezes, no crackles  Abdomen: soft, nontender, nondistended, normal bowel tones heard  Skin: dry, no rashes  Musculoskeletal: no joint effusions, normal range of motion  Psychiatric: appropriate affect, normal speech  Neurologic: extraocular muscles intact, clear speech, moving all extremities with intact sensorium   Discharge Instructions You were cared for by a hospitalist during  your hospital stay. If you have any questions about your discharge medications or the care you received while you were in the hospital after you are discharged, you can call the unit and asked to speak with the hospitalist on call if the hospitalist that took care of you is not available. Once you are discharged, your primary care physician will handle any further medical issues. Please note that NO REFILLS for any discharge medications will be authorized once you are discharged, as it is imperative that you return to your primary care physician (or establish a relationship with a primary care physician if you do not have one) for your aftercare needs so that they can reassess your need for medications and monitor your lab values.   Allergies as of 03/13/2022   No Known Allergies      Medication List     STOP taking these medications    amoxicillin-clavulanate 875-125 MG tablet Commonly known as: AUGMENTIN Replaced by: amoxicillin-clavulanate 400-57 MG/5ML suspension       TAKE these medications    amoxicillin-clavulanate 400-57 MG/5ML suspension Commonly known as: AUGMENTIN Take 10 mLs (800 mg total) by mouth every 12 (twelve) hours for 5 days. Replaces: amoxicillin-clavulanate 875-125 MG tablet   Oxycodone HCl 10 MG Tabs Take 1 tablet (10 mg total) by mouth every 4 (four) hours as needed for up to 5 days (moderate pain). What changed:  when to take this reasons to take this       No Known Allergies   The results of significant diagnostics from this hospitalization (including imaging, microbiology, ancillary and laboratory) are listed below for reference.    Significant Diagnostic Studies: DG Chest Port 1 View  Result Date: 03/04/2022 CLINICAL DATA:  10026 Shortness of breath 10026 [id number] Muscle ache [id number] EXAM: PORTABLE CHEST 1 VIEW COMPARISON:  February 11, 2022 FINDINGS: The cardiomediastinal silhouette is normal in contour. No pleural effusion. No pneumothorax. No  acute pleuroparenchymal abnormality. Visualized abdomen is unremarkable. IMPRESSION: No acute cardiopulmonary abnormality. Electronically Signed   By: Meda Klinefelter M.D.   On: 03/04/2022 14:05   DG Chest Port 1 View  Result Date: 02/11/2022 CLINICAL DATA:  Dyspnea on exertion EXAM: PORTABLE CHEST 1 VIEW COMPARISON:  01/25/2022 FINDINGS: Single frontal view of the chest demonstrates a an unremarkable cardiac silhouette. No acute airspace disease, effusion, or pneumothorax. No acute bony abnormality. IMPRESSION: 1. No acute intrathoracic process. Electronically Signed   By: Sharlet Salina M.D.   On: 02/11/2022 15:10    Microbiology: Recent Results (from the past 240 hour(s))  Resp panel by RT-PCR (RSV, Flu A&B, Covid) Anterior Nasal Swab     Status: None   Collection Time: 03/04/22  1:46 PM   Specimen: Anterior Nasal Swab  Result Value Ref Range Status   SARS Coronavirus 2 by RT PCR NEGATIVE NEGATIVE Final    Comment: (NOTE) SARS-CoV-2 target nucleic acids are NOT DETECTED.  The SARS-CoV-2 RNA is generally detectable in upper respiratory specimens during the  acute phase of infection. The lowest concentration of SARS-CoV-2 viral copies this assay can detect is 138 copies/mL. A negative result does not preclude SARS-Cov-2 infection and should not be used as the sole basis for treatment or other patient management decisions. A negative result may occur with  improper specimen collection/handling, submission of specimen other than nasopharyngeal swab, presence of viral mutation(s) within the areas targeted by this assay, and inadequate number of viral copies(<138 copies/mL). A negative result must be combined with clinical observations, patient history, and epidemiological information. The expected result is Negative.  Fact Sheet for Patients:  EntrepreneurPulse.com.au  Fact Sheet for Healthcare Providers:  IncredibleEmployment.be  This test is no  t yet approved or cleared by the [place] FDA and  has been authorized for detection and/or diagnosis of SARS-CoV-2 by FDA under an Emergency Use Authorization (EUA). This EUA will remain  in effect (meaning this test can be used) for the duration of the COVID-19 declaration under Section 564(b)(1) of the Act, 21 U.S.C.section 360bbb-3(b)(1), unless the authorization is terminated  or revoked sooner.       Influenza A by PCR NEGATIVE NEGATIVE Final   Influenza B by PCR NEGATIVE NEGATIVE Final    Comment: (NOTE) The Xpert Xpress SARS-CoV-2/FLU/RSV plus assay is intended as an aid in the diagnosis of influenza from Nasopharyngeal swab specimens and should not be used as a sole basis for treatment. Nasal washings and aspirates are unacceptable for Xpert Xpress SARS-CoV-2/FLU/RSV testing.  Fact Sheet for Patients: EntrepreneurPulse.com.au  Fact Sheet for Healthcare Providers: IncredibleEmployment.be  This test is not yet approved or cleared by the [place] FDA and has been authorized for detection and/or diagnosis of SARS-CoV-2 by FDA under an Emergency Use Authorization (EUA). This EUA will remain in effect (meaning this test can be used) for the duration of the COVID-19 declaration under Section 564(b)(1) of the Act, 21 U.S.C. section 360bbb-3(b)(1), unless the authorization is terminated or revoked.     Resp Syncytial Virus by PCR NEGATIVE NEGATIVE Final    Comment: (NOTE) Fact Sheet for Patients: EntrepreneurPulse.com.au  Fact Sheet for Healthcare Providers: IncredibleEmployment.be  This test is not yet approved or cleared by the [place] FDA and has been authorized for detection and/or diagnosis of SARS-CoV-2 by FDA under an Emergency Use Authorization (EUA). This EUA will remain in effect (meaning this test can be used) for the duration of the COVID-19 declaration under Section  564(b)(1) of the Act, 21 U.S.C. section 360bbb-3(b)(1), unless the authorization is terminated or revoked.  Performed at [facility], [address] [place], [place] [address]   Group A Strep by PCR     Status: None   Collection Time: 03/04/22  6:17 PM   Specimen: Throat; Sterile Swab  Result Value Ref Range Status   Group A Strep by PCR NOT DETECTED NOT DETECTED Final    Comment: Performed at [facility], [address] [place],  [address]     Labs: Basic Metabolic Panel: Recent Labs  Lab 03/08/22 2345 03/09/22 0508 03/10/22 0437 03/11/22 0434 03/12/22 0339 03/13/22 0302  NA 133*  --  134* 138 136 136  K 4.0  --  3.7 3.7 3.6 3.6  CL 99  --  98 101 101 101  CO2 23  --  29 28 27 27   GLUCOSE 94  --  98 101* 95 94  BUN 18  --  10 9 11 10   CREATININE 0.81 0.79 0.65 0.57* 0.56* 0.56*  CALCIUM  9.0  --  9.1 9.4 9.0 8.9   Liver Function Tests: Recent Labs  Lab 03/06/22 1407 03/08/22 2345  AST 98* 269*  ALT 107* 153*  ALKPHOS 68 79  BILITOT 0.8 0.6  PROT 7.7 8.1  ALBUMIN 3.9 4.2   No results for input(s): "LIPASE", "AMYLASE" in the last 168 hours. No results for input(s): "AMMONIA" in the last 168 hours. CBC: Recent Labs  Lab 03/08/22 2345 03/09/22 0508  WBC 9.6 10.0  NEUTROABS 6.8  --   HGB 15.8 15.0  HCT 45.7 43.2  MCV 90.3 88.7  PLT 229 196   Cardiac Enzymes: Recent Labs  Lab 03/08/22 2345 03/10/22 0437 03/11/22 0434 03/12/22 0339 03/13/22 0302  CKTOTAL 47,805* >50,000* >50,000* 16,109* 20,296*   BNP: BNP (last 3 results) No results for input(s): "BNP" in the last 8760 hours.  ProBNP (last 3 results) No results for input(s): "PROBNP" in the last 8760 hours.  CBG: No results for input(s): "GLUCAP" in the last 168 hours.  Time spent: > 30 minutes were spent in preparing this discharge including medication reconciliation, counseling, and coordination of care.  Signed:  Shylie Polo Vergie Living, MD  [facility] 03/13/2022, 11:22 AM

## 2022-03-31 ENCOUNTER — Encounter: Payer: Self-pay | Admitting: Emergency Medicine

## 2022-03-31 ENCOUNTER — Observation Stay (HOSPITAL_COMMUNITY)
Admit: 2022-03-31 | Discharge: 2022-03-31 | Disposition: A | Discharge status: Home / Self Care | Payer: Medicaid Other | Attending: Internal Medicine | Admitting: Internal Medicine

## 2022-03-31 ENCOUNTER — Inpatient Hospital Stay
Admission: EM | Admit: 2022-03-31 | Discharge: 2022-04-02 | DRG: 558 | Disposition: A | Discharge status: Home / Self Care | Payer: Medicaid Other | Attending: Internal Medicine | Admitting: Internal Medicine

## 2022-03-31 ENCOUNTER — Emergency Department: Payer: Medicaid Other

## 2022-03-31 ENCOUNTER — Other Ambulatory Visit: Payer: Self-pay

## 2022-03-31 DIAGNOSIS — R748 Abnormal levels of other serum enzymes: Secondary | ICD-10-CM | POA: Diagnosis present

## 2022-03-31 DIAGNOSIS — E7404 McArdle disease: Secondary | ICD-10-CM | POA: Diagnosis not present

## 2022-03-31 DIAGNOSIS — E871 Hypo-osmolality and hyponatremia: Secondary | ICD-10-CM | POA: Diagnosis present

## 2022-03-31 DIAGNOSIS — R0602 Shortness of breath: Secondary | ICD-10-CM | POA: Diagnosis present

## 2022-03-31 DIAGNOSIS — R000 Tachycardia, unspecified: Secondary | ICD-10-CM | POA: Diagnosis present

## 2022-03-31 DIAGNOSIS — Z87442 Personal history of urinary calculi: Secondary | ICD-10-CM

## 2022-03-31 DIAGNOSIS — I420 Dilated cardiomyopathy: Secondary | ICD-10-CM

## 2022-03-31 DIAGNOSIS — M6282 Rhabdomyolysis: Secondary | ICD-10-CM | POA: Diagnosis not present

## 2022-03-31 DIAGNOSIS — Z87891 Personal history of nicotine dependence: Secondary | ICD-10-CM

## 2022-03-31 DIAGNOSIS — Z8249 Family history of ischemic heart disease and other diseases of the circulatory system: Secondary | ICD-10-CM

## 2022-03-31 LAB — CREATININE, SERUM
Creatinine, Ser: 0.61 mg/dL (ref 0.61–1.24)
GFR, Estimated: >60 mL/min (ref >60)

## 2022-03-31 LAB — CBC
HCT: 43.3 % (ref 39.0–52.0)
HCT: 40 % (ref 39.0–52.0)
Hemoglobin: 14.9 g/dL (ref 13.0–17.0)
Hemoglobin: 13.9 g/dL (ref 13.0–17.0)
MCH: 30.7 pg (ref 26.0–34.0)
MCH: 30.9 pg (ref 26.0–34.0)
MCHC: 34.4 g/dL (ref 30.0–36.0)
MCHC: 34.8 g/dL (ref 30.0–36.0)
MCV: 89.1 fL (ref 80.0–100.0)
MCV: 88.9 fL (ref 80.0–100.0)
Platelets: 264 10*3/uL (ref 150–400)
Platelets: 230 10*3/uL (ref 150–400)
RBC: 4.86 MIL/uL (ref 4.22–5.81)
RBC: 4.5 MIL/uL (ref 4.22–5.81)
RDW: 12.8 % (ref 11.5–15.5)
RDW: 13 % (ref 11.5–15.5)
WBC: 5.8 10*3/uL (ref 4.0–10.5)
WBC: 7.1 10*3/uL (ref 4.0–10.5)
nRBC: 0 % (ref 0.0–0.2)
nRBC: 0 % (ref 0.0–0.2)

## 2022-03-31 LAB — ECHOCARDIOGRAM COMPLETE
AR max vel: 3.67 cm2
AV Peak grad: 4.2 mmHg
Ao pk vel: 1.03 m/s
Area-P 1/2: 2.17 cm2
Height: 69 in
S' Lateral: 2.9 cm
Weight: 2328.06 oz

## 2022-03-31 LAB — BASIC METABOLIC PANEL
Anion gap: 9 (ref 5–15)
BUN: 9 mg/dL (ref 6–20)
CO2: 24 mmol/L (ref 22–32)
Calcium: 8.9 mg/dL (ref 8.9–10.3)
Chloride: 100 mmol/L (ref 98–111)
Creatinine, Ser: 0.62 mg/dL (ref 0.61–1.24)
GFR, Estimated: >60 mL/min (ref >60)
Glucose, Bld: 93 mg/dL (ref 70–99)
Potassium: 3.6 mmol/L (ref 3.5–5.1)
Sodium: 133 mmol/L — ABNORMAL LOW (ref 135–145)

## 2022-03-31 LAB — TROPONIN I (HIGH SENSITIVITY)
Troponin I (High Sensitivity): 4 ng/L (ref <18)
Troponin I (High Sensitivity): 4 ng/L (ref <18)

## 2022-03-31 LAB — CK
Total CK: 2017 U/L — ABNORMAL HIGH (ref 49–397)
Total CK: 3288 U/L — ABNORMAL HIGH (ref 49–397)

## 2022-03-31 MED ORDER — OXYCODONE HCL 5 MG PO TABS
5.0000 mg | ORAL_TABLET | ORAL | Status: DC | PRN
  Administered 2022-03-31 – 2022-04-02 (×9): 5 mg via ORAL
  Filled 2022-03-31 (×9): qty 1

## 2022-03-31 MED ORDER — HYDROMORPHONE HCL 1 MG/ML IJ SOLN
1.0000 mg | Freq: Once | INTRAMUSCULAR | Status: AC
  Administered 2022-03-31: 1 mg via INTRAVENOUS
  Filled 2022-03-31: qty 1

## 2022-03-31 MED ORDER — HYDROMORPHONE HCL 1 MG/ML IJ SOLN
0.5000 mg | INTRAMUSCULAR | Status: DC | PRN
  Administered 2022-03-31 (×2): 0.5 mg via INTRAVENOUS
  Filled 2022-03-31 (×2): qty 0.5

## 2022-03-31 MED ORDER — LACTATED RINGERS IV BOLUS
2000.0000 mL | Freq: Once | INTRAVENOUS | Status: AC
  Administered 2022-03-31: 2000 mL via INTRAVENOUS

## 2022-03-31 MED ORDER — SODIUM CHLORIDE 0.9 % IV SOLN: INTRAVENOUS | Status: DC

## 2022-03-31 MED ORDER — OXYCODONE HCL 5 MG PO TABS
10.0000 mg | ORAL_TABLET | ORAL | Status: DC | PRN
  Administered 2022-03-31: 10 mg via ORAL
  Filled 2022-03-31: qty 2

## 2022-03-31 MED ORDER — LACTATED RINGERS IV BOLUS
1000.0000 mL | Freq: Once | INTRAVENOUS | Status: AC
  Administered 2022-03-31: 1000 mL via INTRAVENOUS

## 2022-03-31 MED ORDER — OXYCODONE-ACETAMINOPHEN 7.5-325 MG PO TABS
1.0000 | ORAL_TABLET | ORAL | Status: DC | PRN
  Administered 2022-03-31: 1 via ORAL
  Filled 2022-03-31: qty 1

## 2022-03-31 MED ORDER — MORPHINE SULFATE (PF) 4 MG/ML IV SOLN
4.0000 mg | Freq: Once | INTRAVENOUS | Status: AC
  Administered 2022-03-31: 4 mg via INTRAVENOUS
  Filled 2022-03-31: qty 1

## 2022-03-31 MED ORDER — HYDROMORPHONE HCL 1 MG/ML IJ SOLN
1.0000 mg | INTRAMUSCULAR | Status: DC | PRN
  Administered 2022-03-31 – 2022-04-02 (×9): 1 mg via INTRAVENOUS
  Filled 2022-03-31 (×9): qty 1

## 2022-03-31 MED ORDER — ENOXAPARIN SODIUM 40 MG/0.4ML IJ SOSY
40.0000 mg | PREFILLED_SYRINGE | INTRAMUSCULAR | Status: DC
  Filled 2022-03-31: qty 0.4

## 2022-03-31 NOTE — ED Triage Notes (Signed)
Pt presents via POV with complaints of generalized CP that started several hours ago. He also states he thinks his "CK levels are elevated"  -Hx of McArdle disease and recurrent Rhabdo. Denies SOB, fevers, chills, N/V/D.

## 2022-03-31 NOTE — Progress Notes (Signed)
  Echocardiogram 2D Echocardiogram has been performed.  Claretta Fraise 03/31/2022, 11:32 AM

## 2022-03-31 NOTE — ED Notes (Signed)
The pt has been given 59m of Dilaudid IV, for a pain of 10/10 as reported by the pt. This RN will reassess the pt's pain, close monitoring continued.

## 2022-03-31 NOTE — Progress Notes (Signed)
  Arrived to [facility] from Emergency Department. Vitals obtained. Oriented and educated on unit and call bell. Notified Dr. Roosevelt Locks of pain despite PRN. Patient states that 1 mg Dialudid worked better than the current 0.5 mg, relayed to Dr. Roosevelt Locks. Dr. Roosevelt Locks stated to change dilaudid to 1mg  q4 and decrease oxycodone to 5 mg PRN. Orders modified.     03/31/22 1713  Vitals  Temp 98.6 F (37 C)  Temp Source Oral  BP 133/88  MAP (mmHg) 101  BP Location Right Arm  BP Method Automatic  Patient Position (if appropriate) Lying  Pulse Rate 82  Pulse Rate Source Dinamap  Resp 16  Level of Consciousness  Level of Consciousness Alert  MEWS COLOR  MEWS Score Color Green  Oxygen Therapy  SpO2 93 %  O2 Device Room Air  Pain Assessment  Pain Scale 0-10  Pain Score 8  Pain Type Acute pain  Pain Location Generalized  Pain Descriptors / Indicators Aching  Pain Frequency Constant  Pain Onset On-going  Pain Intervention(s) MD notified (Comment)  MEWS Score  MEWS Temp 0  MEWS Systolic 0  MEWS Pulse 0  MEWS RR 0  MEWS LOC 0  MEWS Score 0  Provider Notification  Provider Name/Title Dr. Roosevelt Locks - Attending  Date Provider Notified 03/31/22  Time Provider Notified 1711  Method of Notification  (Secure Chat)  Notification Reason Requested by patient/family  Provider response Evaluate remotely;See new orders  Date of Provider Response 03/31/22  Time of Provider Response [phone]

## 2022-03-31 NOTE — H&P (Signed)
History and Physical    Patient: Roberto Stanton [id number] DOB: 1995-05-07 DOA: 03/31/2022 DOS: the patient was seen and examined on 03/31/2022 PCP: Rutherford Limerick, PA  Patient coming from: Home  Chief Complaint:  Chief Complaint  Patient presents with   Chest Pain   HPI: Roberto Stanton is a 27 y.o. male with medical history significant of MaArdle disease with recurrent rhabdomyolysis, who presents to the hospital with severe muscle pain, was found to have elevated CK level of 3288.  He received 3 L of lactated Ringer solution bolus, admitted to the hospital for continued IV fluids.  Patient symptoms started 2 days ago, patient did not have extensive workup.  The pain involving the all muscles, severe.  Patient also complaining from short of breath when he was eating.  Does not have paroxysmal active dyspnea or orthopnea.   Review of Systems: As mentioned in the history of present illness. All other systems reviewed and are negative. Past Medical History:  Diagnosis Date   Kidney stones    McArdle disease ([place]) 09/28/2021   Pericarditis    Rhabdomyolysis    History reviewed. No pertinent surgical history. Social History:  reports that he has quit smoking. He has never used smokeless tobacco. He reports that he does not currently use alcohol. He reports that he does not currently use drugs after having used the following drugs: Marijuana.  No Known Allergies  Family History  Problem Relation Age of Onset   Heart disease Father     Prior to Admission medications   Medication Sig Start Date End Date Taking? Authorizing Provider  Oxycodone HCl 10 MG TABS Take 10 mg by mouth every 4 (four) hours. 03/24/22  Yes [provider]    Physical Exam: Vitals:   03/31/22 0300 03/31/22 0424 03/31/22 0734 03/31/22 0930  BP: 128/89 135/89  124/74  Pulse: 82 97  95  Resp: 15 13  16  $ Temp:  98.2 F (36.8 C) 98 F (36.7 C)   TempSrc:   Oral   SpO2: 98% 100%  97%   Weight:      Height:       Physical Exam Constitutional:      General: He is not in acute distress.    Appearance: He is normal weight. He is not ill-appearing or toxic-appearing.  HENT:     Head: Normocephalic and atraumatic.  Eyes:     Extraocular Movements: Extraocular movements intact.     Pupils: Pupils are equal, round, and reactive to light.  Neck:     Thyroid: No thyromegaly.     Vascular: No hepatojugular reflux or JVD.  Cardiovascular:     Rate and Rhythm: Normal rate and regular rhythm.     Heart sounds: Normal heart sounds.  Pulmonary:     Effort: Pulmonary effort is normal. No respiratory distress.     Breath sounds: Normal breath sounds.  Abdominal:     General: Bowel sounds are normal. There is no abdominal bruit.     Palpations: Abdomen is soft. There is no hepatomegaly or mass.     Tenderness: There is no abdominal tenderness.  Musculoskeletal:        General: Normal range of motion.     Cervical back: Normal range of motion and neck supple.     Right lower leg: No edema.     Left lower leg: No edema.  Skin:    General: Skin is warm and dry.     Coloration:  Skin is not cyanotic.  Neurological:     General: No focal deficit present.     Mental Status: He is alert and oriented to person, place, and time.     Cranial Nerves: No cranial nerve deficit.  Psychiatric:        Mood and Affect: Mood normal.        Behavior: Behavior normal.     Data Reviewed:    Assessment and Plan: Acute nontraumatic rhabdomyolysis. McArdle disease with recurrent rhabdomyolysis. Patient has received IV fluids bolus, will continue IV fluid infusion.  Monitor CK level. Patient also had severe muscle pain, will start as needed pain medicine.  Shortness of breath. Patient has been complaining short of breath while eating, will obtain echocardiogram to rule out dilated cardiomyopathy, specifically the dilation of left atria.    Advance Care Planning:   Code Status: Full  Code patient is a full code.  Consults: None  Family Communication: None  Severity of Illness: The appropriate patient status for this patient is OBSERVATION. Observation status is judged to be reasonable and necessary in order to provide the required intensity of service to ensure the patient's safety. The patient's presenting symptoms, physical exam findings, and initial radiographic and laboratory data in the context of their medical condition is felt to place them at decreased risk for further clinical deterioration. Furthermore, it is anticipated that the patient will be medically stable for discharge from the hospital within 2 midnights of admission.   Author: Sharen Hones, MD 03/31/2022 9:52 AM  For on call review [email or web address].

## 2022-03-31 NOTE — ED Provider Notes (Signed)
[facility] Provider Note    Event Date/Time   First MD Initiated Contact with Patient 03/31/22 0033     (approximate)   History   Chest Pain   HPI  Roberto Stanton is a 27 y.o. male who presents to the ED for evaluation of Chest Pain   I reviewed recurrent ED visits, hospitalizations for rhabdomyolysis atraumatically in the setting of McArdle disease.  He is well-known to [place] for this.  He was seen at [facility] ED just yesterday and I reviewed this visit.  Was also complaining of intermittent sharp chest pains at that time.  He had a negative D-dimer and a CK level of 1170 he was given IV fluids and analgesics and discharged  He presents to our ED for evaluation of "I think my CK level is elevated.  He reports sharp intermittent chest pain that sometimes occurs in the setting of his nontraumatic rhabdomyolysis.  Denies any syncopal episodes.  Reports his symptoms seemed to worsen after he left at the [facility] ED, so he wanted to get checked out by [place].   Physical Exam   Triage Vital Signs: ED Triage Vitals  Enc Vitals Group     BP 03/31/22 0032 (!) 143/84     Pulse Rate 03/31/22 0032 (!) 109     Resp 03/31/22 0032 18     Temp 03/31/22 0032 98.1 F (36.7 C)     Temp Source 03/31/22 0032 Oral     SpO2 03/31/22 0032 95 %     Weight 03/31/22 0022 145 lb 8.1 oz (66 kg)     Height 03/31/22 0022 5' 9"$  (1.753 m)     Head Circumference --      Peak Flow --      Pain Score 03/31/22 0021 8     Pain Loc --      Pain Edu? --      Excl. in [place]? --     Most recent vital signs: Vitals:   03/31/22 0300 03/31/22 0424  BP: 128/89 135/89  Pulse: 82 97  Resp: 15 13  Temp:  98.2 F (36.8 C)  SpO2: 98% 100%    General: Awake, no distress.  Appears tired, but systemically well CV:  Good peripheral perfusion.  Resp:  Normal effort.  Abd:  No distention.  MSK:  No deformity noted.  Neuro:  No focal deficits appreciated. Other:     ED Results /  Procedures / Treatments   Labs (all labs ordered are listed, but only abnormal results are displayed) Labs Reviewed  BASIC METABOLIC PANEL - Abnormal; Notable for the following components:      Result Value   Sodium 133 (*)    All other components within normal limits  CK - Abnormal; Notable for the following components:   Total CK 2,017 (*)    All other components within normal limits  CK - Abnormal; Notable for the following components:   Total CK 3,288 (*)    All other components within normal limits  CBC  TROPONIN I (HIGH SENSITIVITY)  TROPONIN I (HIGH SENSITIVITY)    EKG Sinus tachycardia with rate of 114 bpm.  Normal axis and intervals.  No presents with acute ischemia.  RADIOLOGY CXR interpreted by me without evidence of acute cardiopulmonary pathology.  Official radiology report(s): DG Chest 2 View  Result Date: 03/31/2022 CLINICAL DATA:  CP EXAM: CHEST - 2 VIEW COMPARISON:  Chest x-ray 03/04/2022 FINDINGS: The heart and mediastinal contours are  within normal limits. Hyperinflation of the lungs. No focal consolidation. No pulmonary edema. No pleural effusion. No pneumothorax. No acute osseous abnormality. IMPRESSION: No active cardiopulmonary disease. Electronically Signed   By: Iven Finn M.D.   On: 03/31/2022 00:50    PROCEDURES and INTERVENTIONS:  .1-3 Lead EKG Interpretation  Performed by: Vladimir Crofts, MD Authorized by: Vladimir Crofts, MD     Interpretation: abnormal     ECG rate:  105   ECG rate assessment: tachycardic     Rhythm: sinus tachycardia     Ectopy: none     Conduction: normal     Medications  lactated ringers bolus 2,000 mL (0 mLs Intravenous Stopped 03/31/22 0319)  morphine (PF) 4 MG/ML injection 4 mg (4 mg Intravenous Given 03/31/22 0144)  HYDROmorphone (DILAUDID) injection 1 mg (1 mg Intravenous Given 03/31/22 0251)  HYDROmorphone (DILAUDID) injection 1 mg (1 mg Intravenous Given 03/31/22 0422)     IMPRESSION / MDM / ASSESSMENT AND PLAN  / ED COURSE  I reviewed the triage vital signs and the nursing notes.  Differential diagnosis includes, but is not limited to, ACS, PTX, PNA, muscle strain/spasm, PE, dissection  {Patient presents with symptoms of an acute illness or injury that is potentially life-threatening.  27 year old with McArdle disease presents with another episode of atraumatic rhabdomyolysis.  History CK is not terribly elevated, at about 2000, so we trialed IV fluids, analgesia and a few hours of observation, but his CK worsens despite this and he has been calling out repeatedly for multiple rounds of analgesics.  Will therefore consult with medicine for admission.  Otherwise his workup is fairly benign.  EKG is nonischemic, 2 negative troponins and normal CBC and metabolic panel.      FINAL CLINICAL IMPRESSION(S) / ED DIAGNOSES   Final diagnoses:  Non-traumatic rhabdomyolysis     Rx / DC Orders   ED Discharge Orders     None        Note:  This document was prepared using Dragon voice recognition software and may include unintentional dictation errors.   Vladimir Crofts, MD 03/31/22 [phone]

## 2022-04-01 DIAGNOSIS — R748 Abnormal levels of other serum enzymes: Secondary | ICD-10-CM | POA: Diagnosis present

## 2022-04-01 DIAGNOSIS — Z8249 Family history of ischemic heart disease and other diseases of the circulatory system: Secondary | ICD-10-CM | POA: Diagnosis not present

## 2022-04-01 DIAGNOSIS — E871 Hypo-osmolality and hyponatremia: Secondary | ICD-10-CM

## 2022-04-01 DIAGNOSIS — Z87891 Personal history of nicotine dependence: Secondary | ICD-10-CM | POA: Diagnosis not present

## 2022-04-01 DIAGNOSIS — E7404 McArdle disease: Secondary | ICD-10-CM | POA: Diagnosis not present

## 2022-04-01 DIAGNOSIS — M6282 Rhabdomyolysis: Secondary | ICD-10-CM | POA: Diagnosis not present

## 2022-04-01 DIAGNOSIS — R0602 Shortness of breath: Secondary | ICD-10-CM | POA: Diagnosis present

## 2022-04-01 DIAGNOSIS — Z87442 Personal history of urinary calculi: Secondary | ICD-10-CM | POA: Diagnosis not present

## 2022-04-01 DIAGNOSIS — R000 Tachycardia, unspecified: Secondary | ICD-10-CM | POA: Diagnosis present

## 2022-04-01 LAB — CK: Total CK: 10916 U/L — ABNORMAL HIGH (ref 49–397)

## 2022-04-01 LAB — BASIC METABOLIC PANEL
Anion gap: 8 (ref 5–15)
BUN: 11 mg/dL (ref 6–20)
CO2: 25 mmol/L (ref 22–32)
Calcium: 8.9 mg/dL (ref 8.9–10.3)
Chloride: 103 mmol/L (ref 98–111)
Creatinine, Ser: 0.6 mg/dL — ABNORMAL LOW (ref 0.61–1.24)
GFR, Estimated: >60 mL/min (ref >60)
Glucose, Bld: 130 mg/dL — ABNORMAL HIGH (ref 70–99)
Potassium: 3.5 mmol/L (ref 3.5–5.1)
Sodium: 136 mmol/L (ref 135–145)

## 2022-04-01 LAB — MAGNESIUM: Magnesium: 1.8 mg/dL (ref 1.7–2.4)

## 2022-04-01 MED ORDER — POTASSIUM CHLORIDE CRYS ER 20 MEQ PO TBCR
40.0000 meq | EXTENDED_RELEASE_TABLET | Freq: Once | ORAL | Status: AC
  Administered 2022-04-01: 40 meq via ORAL
  Filled 2022-04-01: qty 2

## 2022-04-01 MED ORDER — POTASSIUM CHLORIDE IN NACL 20-0.9 MEQ/L-% IV SOLN
INTRAVENOUS | Status: DC
  Filled 2022-04-01 (×4): qty 1000

## 2022-04-01 NOTE — Hospital Course (Signed)
Roberto Stanton is a 27 y.o. male with medical history significant of MaArdle disease with recurrent rhabdomyolysis, who presents to the hospital with severe muscle pain, was found to have elevated CK level of 3288, peaked to 10,000.  Fluids was given.  Also given pain medicine for muscle pain.

## 2022-04-01 NOTE — Progress Notes (Signed)
  Progress Note   Patient: Roberto Stanton:[id number] DOB: [birth year] DOA: 03/31/2022     0 DOS: the patient was seen and examined on 04/01/2022   Brief hospital course: Roberto Stanton is a 27 y.o. male with medical history significant of MaArdle disease with recurrent rhabdomyolysis, who presents to the hospital with severe muscle pain, was found to have elevated CK level of 3288, peaked to 10,000.  Fluids was given.  Also given pain medicine for muscle pain.   Principal Problem:   Non-traumatic rhabdomyolysis, recurrent Active Problems:   Myophosphorylase deficiency (glycogen storage disease V, McArdle disease) (HCC)   Hyponatremia   Assessment and Plan: Acute nontraumatic rhabdomyolysis. McArdle disease with recurrent rhabdomyolysis. CK level increased to 10,000.  Continue IV fluids.  Potassium 3.5, added potassium into fluids.  Recheck levels tomorrow.   Shortness of breath. Echocardiogram showed normal ejection fraction.  No valvular abnormality.  Hyponatremia. Sodium level normalized.    Subjective:  Complaining of muscle pain, short of breath has resolved.  Physical Exam: Vitals:   03/31/22 1713 03/31/22 2102 04/01/22 0554 04/01/22 0753  BP: 133/88 119/79 100/64 111/78  Pulse: 82 69 63 64  Resp: 16 18 16 18   Temp: 98.6 F (37 C) 98.2 F (36.8 C) 97.6 F (36.4 C) 97.9 F (36.6 C)  TempSrc: Oral Oral Oral Oral  SpO2: 93% 100% 97% 99%  Weight:      Height:       General exam: Appears calm and comfortable  Respiratory system: Clear to auscultation. Respiratory effort normal. Cardiovascular system: S1 & S2 heard, RRR. No JVD, murmurs, rubs, gallops or clicks. No pedal edema. Gastrointestinal system: Abdomen is nondistended, soft and nontender. No organomegaly or masses felt. Normal bowel sounds heard. Central nervous system: Alert and oriented. No focal neurological deficits. Extremities: Symmetric 5 x 5 power. Skin: No rashes, lesions or  ulcers Psychiatry: Judgement and insight appear normal. Mood & affect appropriate.    Data Reviewed:  Lab results reviewed.  Family Communication: None  Disposition: Status is: Observation      Time spent: 35 minutes  Author: Sharen Hones, MD 04/01/2022 1:56 PM  For on call review [email or web address].

## 2022-04-02 DIAGNOSIS — E7404 McArdle disease: Secondary | ICD-10-CM | POA: Diagnosis not present

## 2022-04-02 DIAGNOSIS — M6282 Rhabdomyolysis: Secondary | ICD-10-CM | POA: Diagnosis not present

## 2022-04-02 DIAGNOSIS — E871 Hypo-osmolality and hyponatremia: Secondary | ICD-10-CM | POA: Diagnosis not present

## 2022-04-02 LAB — MAGNESIUM: Magnesium: 1.8 mg/dL (ref 1.7–2.4)

## 2022-04-02 LAB — BASIC METABOLIC PANEL
Anion gap: 7 (ref 5–15)
BUN: 11 mg/dL (ref 6–20)
CO2: 27 mmol/L (ref 22–32)
Calcium: 8.8 mg/dL — ABNORMAL LOW (ref 8.9–10.3)
Chloride: 102 mmol/L (ref 98–111)
Creatinine, Ser: 0.6 mg/dL — ABNORMAL LOW (ref 0.61–1.24)
GFR, Estimated: >60 mL/min (ref >60)
Glucose, Bld: 99 mg/dL (ref 70–99)
Potassium: 3.8 mmol/L (ref 3.5–5.1)
Sodium: 136 mmol/L (ref 135–145)

## 2022-04-02 LAB — CK: Total CK: 10134 U/L — ABNORMAL HIGH (ref 49–397)

## 2022-04-02 MED ORDER — OXYCODONE HCL 10 MG PO TABS: 10.0000 mg | ORAL_TABLET | Freq: Four times a day (QID) | ORAL | 0 refills | Status: DC | PRN

## 2022-04-02 NOTE — Progress Notes (Signed)
Discharge instructions reviewed with patient including followup visits and new medications.  Understanding was verbalized and all questions were answered.  IV removed without complication; patient tolerated well.  Patient discharged home ambulatory in stable condition escorted by nursing staff.

## 2022-04-02 NOTE — Discharge Summary (Signed)
Physician Discharge Summary   Patient: Roberto Stanton MRN: [id number] DOB: [birth year]  Admit date:     03/31/2022  Discharge date: 04/02/22  Discharge Physician: Sharen Hones   PCP: Rutherford Limerick, PA   Recommendations at discharge:   Follow-up with PCP in 1 week. Drink plenty of water with electrolytes.  Discharge Diagnoses: Principal Problem:   Non-traumatic rhabdomyolysis, recurrent Active Problems:   Rhabdomyolysis   Myophosphorylase deficiency (glycogen storage disease V, McArdle disease) (HCC)   Hyponatremia  Resolved Problems:   * No resolved hospital problems. [facility] Course: Roberto Stanton is a 27 y.o. male with medical history significant of MaArdle disease with recurrent rhabdomyolysis, who presents to the hospital with severe muscle pain, was found to have elevated CK level of 3288, peaked to 10,000.  Fluids was given.  Also given pain medicine for muscle pain.  Assessment and Plan: Acute nontraumatic rhabdomyolysis. McArdle disease with recurrent rhabdomyolysis. Symptomatically improved, CK peaked, slightly dropped.  Patient refused to stay in the hospital any longer, he argued that he had multiple episodes rhabdomyolysis, CK level always dropped down after the peak with oral electrolytes drinking, which has set up at home.  At this point, I cannot hold patient hospital against his wish.  Will discharge him.     Shortness of breath. Echocardiogram showed normal ejection fraction.  No valvular abnormality.   Hyponatremia. Sodium level normalized.        Consultants: None Procedures performed: None  Disposition: Home Diet recommendation:  Discharge Diet Orders (From admission, onward)     Start     Ordered   04/02/22 0000  Diet - low sodium heart healthy        04/02/22 1000           Cardiac diet DISCHARGE MEDICATION: Allergies as of 04/02/2022   No Known Allergies      Medication List     TAKE these medications    Oxycodone  HCl 10 MG Tabs Take 1 tablet (10 mg total) by mouth every 6 (six) hours as needed (severe pain). What changed: when to take this        Follow-up Information     August Luz A, PA Follow up in 1 week(s).   Specialty: Physician Assistant Contact information: [place] [address] [phone]                Discharge Exam: Danley Danker Weights   03/31/22 0022  Weight: 66 kg   General exam: Appears calm and comfortable  Respiratory system: Clear to auscultation. Respiratory effort normal. Cardiovascular system: S1 & S2 heard, RRR. No JVD, murmurs, rubs, gallops or clicks. No pedal edema. Gastrointestinal system: Abdomen is nondistended, soft and nontender. No organomegaly or masses felt. Normal bowel sounds heard. Central nervous system: Alert and oriented. No focal neurological deficits. Extremities: Symmetric 5 x 5 power. Skin: No rashes, lesions or ulcers Psychiatry: Judgement and insight appear normal. Mood & affect appropriate.    Condition at discharge: good  The results of significant diagnostics from this hospitalization (including imaging, microbiology, ancillary and laboratory) are listed below for reference.   Imaging Studies: ECHOCARDIOGRAM COMPLETE  Result Date: 03/31/2022    ECHOCARDIOGRAM REPORT   Patient Name:   Roberto Stanton Date of Exam: 03/31/2022 Medical Rec #:  [id number]        Height:       69.0 in Accession #:    [id number]       Weight:  145.5 lb Date of Birth:  08/27/1995        BSA:          1.805 m Patient Age:    27 years         BP:           124/74 mmHg Patient Gender: M                HR:           68 bpm. 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  Sonographer:     Kathlen Brunswick RDCS Referring Phys:  [id number] Sharen Hones Diagnosing Phys:  Ida Rogue MD  Sonographer Comments: Global longitudinal strain was attempted. IMPRESSIONS  1. Left ventricular ejection fraction, by estimation, is 60 to 65%. The left ventricle has normal function. The left ventricle has no regional wall motion abnormalities. Left ventricular diastolic parameters were normal. The average left ventricular global longitudinal strain is -17.8 %. The global longitudinal strain is normal.  2. Right ventricular systolic function is normal. The right ventricular size is normal.  3. The mitral valve is normal in structure. No evidence of mitral valve regurgitation. No evidence of mitral stenosis.  4. The aortic valve is tricuspid. Aortic valve regurgitation is not visualized. No aortic stenosis is present.  5. The inferior vena cava is normal in size with greater than 50% respiratory variability, suggesting right atrial pressure of 3 mmHg. FINDINGS  Left Ventricle: Left ventricular ejection fraction, by estimation, is 60 to 65%. The left ventricle has normal function. The left ventricle has no regional wall motion abnormalities. The average left ventricular global longitudinal strain is -17.8 %. The global longitudinal strain is normal. The left ventricular internal cavity size was normal in size. There is no left ventricular hypertrophy. Left ventricular diastolic parameters were normal. Right Ventricle: The right ventricular size is normal. No increase in right ventricular wall thickness. Right ventricular systolic function is normal. Left Atrium: Left atrial size was normal in size. Right Atrium: Right atrial size was normal in size. Pericardium: There is no evidence of pericardial effusion. Mitral Valve: The mitral valve is normal in structure. No evidence of mitral valve regurgitation. No evidence of mitral valve stenosis. Tricuspid Valve: The tricuspid valve is normal in structure. Tricuspid valve regurgitation is mild . No evidence of tricuspid stenosis. Aortic Valve: The  aortic valve is tricuspid. Aortic valve regurgitation is not visualized. No aortic stenosis is present. Aortic valve peak gradient measures 4.2 mmHg. Pulmonic Valve: The pulmonic valve was normal in structure. Pulmonic valve regurgitation is mild. No evidence of pulmonic stenosis. Aorta: The aortic root is normal in size and structure. Venous: The inferior vena cava is normal in size with greater than 50% respiratory variability, suggesting right atrial pressure of 3 mmHg. IAS/Shunts: No atrial level shunt detected by color flow Doppler.  LEFT VENTRICLE PLAX 2D LVIDd:         4.10 cm   Diastology LVIDs:         2.90 cm   LV e' medial:    11.60 cm/s LV PW:         0.90 cm   LV E/e' medial:  5.4 LV IVS:  1.00 cm   LV e' lateral:   18.70 cm/s LVOT diam:     2.10 cm   LV E/e' lateral: 3.3 LV SV:         70 LV SV Index:   39        2D Longitudinal Strain LVOT Area:     3.46 cm  2D Strain GLS Avg:     -17.8 %                           3D Volume EF:                          3D EF:        58 %                          LV EDV:       138 ml                          LV ESV:       58 ml                          LV SV:        80 ml RIGHT VENTRICLE RV Basal diam:  3.10 cm RV S prime:     12.50 cm/s TAPSE (M-mode): 1.9 cm LEFT ATRIUM             Index        RIGHT ATRIUM           Index LA diam:        2.60 cm 1.44 cm/m   RA Area:     11.40 cm LA Vol (A2C):   30.8 ml 17.07 ml/m  RA Volume:   26.90 ml  14.91 ml/m LA Vol (A4C):   17.0 ml 9.42 ml/m LA Biplane Vol: 24.3 ml 13.46 ml/m  AORTIC VALVE                 PULMONIC VALVE AV Area (Vmax): 3.67 cm     PV Vmax:        1.01 m/s AV Vmax:        103.00 cm/s  PV Peak grad:   4.1 mmHg AV Peak Grad:   4.2 mmHg     RVOT Peak grad: 2 mmHg LVOT Vmax:      109.00 cm/s LVOT Vmean:     68.500 cm/s LVOT VTI:       0.201 m  AORTA Ao Root diam: 3.00 cm Ao Asc diam:  2.80 cm MITRAL VALVE               TRICUSPID VALVE MV Area (PHT): 2.17 cm    TV Peak grad:   19.4 mmHg MV Decel Time:  349 msec    TV Vmax:        2.20 m/s MV E velocity: 62.40 cm/s MV A velocity: 41.40 cm/s  SHUNTS MV E/A ratio:  1.51        Systemic VTI:  0.20 m                            Systemic Diam: 2.10 cm Ida Rogue MD Electronically signed by Ida Rogue MD Signature Date/Time: 03/31/2022/2:58:11 PM  Final    DG Chest 2 View  Result Date: 03/31/2022 CLINICAL DATA:  CP EXAM: CHEST - 2 VIEW COMPARISON:  Chest x-ray 03/04/2022 FINDINGS: The heart and mediastinal contours are within normal limits. Hyperinflation of the lungs. No focal consolidation. No pulmonary edema. No pleural effusion. No pneumothorax. No acute osseous abnormality. IMPRESSION: No active cardiopulmonary disease. Electronically Signed   By: Iven Finn M.D.   On: 03/31/2022 00:50   DG Chest Port 1 View  Result Date: 03/04/2022 CLINICAL DATA:  10026 Shortness of breath 10026 [id number] Muscle ache [id number] EXAM: PORTABLE CHEST 1 VIEW COMPARISON:  February 11, 2022 FINDINGS: The cardiomediastinal silhouette is normal in contour. No pleural effusion. No pneumothorax. No acute pleuroparenchymal abnormality. Visualized abdomen is unremarkable. IMPRESSION: No acute cardiopulmonary abnormality. Electronically Signed   By: Valentino Saxon M.D.   On: 03/04/2022 14:05    Microbiology: Results for orders placed or performed during the hospital encounter of 03/04/22  Resp panel by RT-PCR (RSV, Flu A&B, Covid) Anterior Nasal Swab     Status: None   Collection Time: 03/04/22  1:46 PM   Specimen: Anterior Nasal Swab  Result Value Ref Range Status   SARS Coronavirus 2 by RT PCR NEGATIVE NEGATIVE Final    Comment: (NOTE) SARS-CoV-2 target nucleic acids are NOT DETECTED.  The SARS-CoV-2 RNA is generally detectable in upper respiratory specimens during the acute phase of infection. The lowest concentration of SARS-CoV-2 viral copies this assay can detect is 138 copies/mL. A negative result does not preclude SARS-Cov-2 infection and should not be  used as the sole basis for treatment or other patient management decisions. A negative result may occur with  improper specimen collection/handling, submission of specimen other than nasopharyngeal swab, presence of viral mutation(s) within the areas targeted by this assay, and inadequate number of viral copies(<138 copies/mL). A negative result must be combined with clinical observations, patient history, and epidemiological information. The expected result is Negative.  Fact Sheet for Patients:  EntrepreneurPulse.com.au  Fact Sheet for Healthcare Providers:  IncredibleEmployment.be  This test is no t yet approved or cleared by the [place] FDA and  has been authorized for detection and/or diagnosis of SARS-CoV-2 by FDA under an Emergency Use Authorization (EUA). This EUA will remain  in effect (meaning this test can be used) for the duration of the COVID-19 declaration under Section 564(b)(1) of the Act, 21 U.S.C.section 360bbb-3(b)(1), unless the authorization is terminated  or revoked sooner.       Influenza A by PCR NEGATIVE NEGATIVE Final   Influenza B by PCR NEGATIVE NEGATIVE Final    Comment: (NOTE) The Xpert Xpress SARS-CoV-2/FLU/RSV plus assay is intended as an aid in the diagnosis of influenza from Nasopharyngeal swab specimens and should not be used as a sole basis for treatment. Nasal washings and aspirates are unacceptable for Xpert Xpress SARS-CoV-2/FLU/RSV testing.  Fact Sheet for Patients: EntrepreneurPulse.com.au  Fact Sheet for Healthcare Providers: IncredibleEmployment.be  This test is not yet approved or cleared by the [place] FDA and has been authorized for detection and/or diagnosis of SARS-CoV-2 by FDA under an Emergency Use Authorization (EUA). This EUA will remain in effect (meaning this test can be used) for the duration of the COVID-19 declaration under Section  564(b)(1) of the Act, 21 U.S.C. section 360bbb-3(b)(1), unless the authorization is terminated or revoked.     Resp Syncytial Virus by PCR NEGATIVE NEGATIVE Final    Comment: (NOTE) Fact Sheet for Patients: EntrepreneurPulse.com.au  Fact Sheet for Healthcare Providers:  IncredibleEmployment.be  This test is not yet approved or cleared by the [place] and has been authorized for detection and/or diagnosis of SARS-CoV-2 by FDA under an Emergency Use Authorization (EUA). This EUA will remain in effect (meaning this test can be used) for the duration of the COVID-19 declaration under Section 564(b)(1) of the Act, 21 U.S.C. section 360bbb-3(b)(1), unless the authorization is terminated or revoked.  Performed at [facility], [address] [place], [place] [address]   Group A Strep by PCR     Status: None   Collection Time: 03/04/22  6:17 PM   Specimen: Throat; Sterile Swab  Result Value Ref Range Status   Group A Strep by PCR NOT DETECTED NOT DETECTED Final    Comment: Performed at [facility], [address] [place],  [address]    Labs: CBC: Recent Labs  Lab 03/31/22 0042 03/31/22 0947  WBC 5.8 7.1  HGB 14.9 13.9  HCT 43.3 40.0  MCV 89.1 88.9  PLT 264 [id number]   Basic Metabolic Panel: Recent Labs  Lab 03/31/22 0042 03/31/22 0947 04/01/22 0239 04/02/22 0549  NA 133*  --  136 136  K 3.6  --  3.5 3.8  CL 100  --  103 102  CO2 24  --  25 27  GLUCOSE 93  --  130* 99  BUN 9  --  11 11  CREATININE 0.62 0.61 0.60* 0.60*  CALCIUM 8.9  --  8.9 8.8*  MG  --   --  1.8 1.8   Liver Function Tests: No results for input(s): "AST", "ALT", "ALKPHOS", "BILITOT", "PROT", "ALBUMIN" in the last 168 hours. CBG: No results for input(s): "GLUCAP" in the last 168 hours.  Discharge time spent: greater than 30 minutes.  Signed: Sharen Hones, MD [facility] 04/02/2022

## 2022-04-26 ENCOUNTER — Emergency Department
Admission: EM | Admit: 2022-04-26 | Discharge: 2022-04-26 | Disposition: A | Discharge status: Home / Self Care | Payer: Medicaid Other | Source: Home / Self Care | Attending: Emergency Medicine | Admitting: Emergency Medicine

## 2022-04-26 ENCOUNTER — Other Ambulatory Visit: Payer: Self-pay

## 2022-04-26 ENCOUNTER — Emergency Department: Payer: Medicaid Other

## 2022-04-26 DIAGNOSIS — Z1152 Encounter for screening for COVID-19: Secondary | ICD-10-CM | POA: Insufficient documentation

## 2022-04-26 DIAGNOSIS — R0789 Other chest pain: Secondary | ICD-10-CM | POA: Insufficient documentation

## 2022-04-26 DIAGNOSIS — M6282 Rhabdomyolysis: Secondary | ICD-10-CM | POA: Insufficient documentation

## 2022-04-26 LAB — BASIC METABOLIC PANEL
Anion gap: 8 (ref 5–15)
BUN: 17 mg/dL (ref 6–20)
CO2: 26 mmol/L (ref 22–32)
Calcium: 9.1 mg/dL (ref 8.9–10.3)
Chloride: 100 mmol/L (ref 98–111)
Creatinine, Ser: 0.66 mg/dL (ref 0.61–1.24)
GFR, Estimated: >60 mL/min (ref >60)
Glucose, Bld: 107 mg/dL — ABNORMAL HIGH (ref 70–99)
Potassium: 3.7 mmol/L (ref 3.5–5.1)
Sodium: 134 mmol/L — ABNORMAL LOW (ref 135–145)

## 2022-04-26 LAB — CBC
HCT: 43 % (ref 39.0–52.0)
Hemoglobin: 14.8 g/dL (ref 13.0–17.0)
MCH: 30.8 pg (ref 26.0–34.0)
MCHC: 34.4 g/dL (ref 30.0–36.0)
MCV: 89.4 fL (ref 80.0–100.0)
Platelets: 256 10*3/uL (ref 150–400)
RBC: 4.81 MIL/uL (ref 4.22–5.81)
RDW: 12.7 % (ref 11.5–15.5)
WBC: 7.1 10*3/uL (ref 4.0–10.5)
nRBC: 0 % (ref 0.0–0.2)

## 2022-04-26 LAB — RESP PANEL BY RT-PCR (RSV, FLU A&B, COVID)  RVPGX2
Influenza A by PCR: NEGATIVE
Influenza B by PCR: NEGATIVE
Resp Syncytial Virus by PCR: NEGATIVE
SARS Coronavirus 2 by RT PCR: NEGATIVE

## 2022-04-26 LAB — CK: Total CK: 2139 U/L — ABNORMAL HIGH (ref 49–397)

## 2022-04-26 LAB — TROPONIN I (HIGH SENSITIVITY): Troponin I (High Sensitivity): 3 ng/L (ref <18)

## 2022-04-26 MED ORDER — SODIUM CHLORIDE 0.9 % IV BOLUS
1000.0000 mL | Freq: Once | INTRAVENOUS | Status: AC
  Administered 2022-04-26: 1000 mL via INTRAVENOUS

## 2022-04-26 MED ORDER — HYDROMORPHONE HCL 1 MG/ML IJ SOLN
1.0000 mg | Freq: Once | INTRAMUSCULAR | Status: AC
  Administered 2022-04-26: 1 mg via INTRAVENOUS
  Filled 2022-04-26: qty 1

## 2022-04-26 MED ORDER — OXYCODONE HCL 10 MG PO TABS: 10.0000 mg | ORAL_TABLET | Freq: Four times a day (QID) | ORAL | 0 refills | Status: DC | PRN

## 2022-04-26 NOTE — ED Provider Notes (Signed)
[facility] Provider Note    Event Date/Time   First MD Initiated Contact with Patient 04/26/22 1758     (approximate)   History   Chest Pain   HPI  Roberto Stanton is a 27 y.o. male with a history of McArdle disease  and chronic recurrent rhabdomyolysis who presents with chest pain and bodyaches for the last several hours.  He states they feel like previous episodes of rhabdomyolysis.  The pain is worse with movement and with deep inspiration.  He reports mild shortness of breath over the last couple of days as well but denies cough, nasal congestion, fever, or any GI symptoms.  The patient is not on any pain medication chronically, however has been referred to pain management and is seeing them in 6 days.  I reviewed the past medical records.  The patient was most recently admitted last month.  Per the hospitalist discharge summary from 2/12 he presented with severe muscle pain in his CK peaked at 10,000.  He was also admitted twice in January as well as in December.   Physical Exam   Triage Vital Signs: ED Triage Vitals  Enc Vitals Group     BP 04/26/22 1653 134/86     Pulse Rate 04/26/22 1653 (!) 106     Resp 04/26/22 1653 18     Temp 04/26/22 1653 98 F (36.7 C)     Temp src --      SpO2 04/26/22 1653 98 %     Weight --      Height --      Head Circumference --      Peak Flow --      Pain Score 04/26/22 1657 5     Pain Loc --      Pain Edu? --      Excl. in [place]? --     Most recent vital signs: Vitals:   04/26/22 1653  BP: 134/86  Pulse: (!) 106  Resp: 18  Temp: 98 F (36.7 C)  SpO2: 98%     General: Awake, no distress.  CV:  Good peripheral perfusion.  Normal heart sounds. Resp:  Normal effort.  Lungs CTAB. Abd:  No distention.  Other:  No peripheral edema.   ED Results / Procedures / Treatments   Labs (all labs ordered are listed, but only abnormal results are displayed) Labs Reviewed  BASIC METABOLIC PANEL - Abnormal;  Notable for the following components:      Result Value   Sodium 134 (*)    Glucose, Bld 107 (*)    All other components within normal limits  CK - Abnormal; Notable for the following components:   Total CK 2,139 (*)    All other components within normal limits  RESP PANEL BY RT-PCR (RSV, FLU A&B, COVID)  [id number]  CBC  TROPONIN I (HIGH SENSITIVITY)     EKG  ED ECG REPORT I, Arta Silence, the attending physician, personally viewed and interpreted this ECG.  Date: 04/26/2022 EKG Time: 1651 Rate: 105 Rhythm: Sinus tachycardia QRS Axis: normal Intervals: normal ST/T Wave abnormalities: normal Narrative Interpretation: no evidence of acute ischemia    RADIOLOGY  Chest x-ray: I independently viewed and interpreted the images; there is no focal consolidation or edema  PROCEDURES:  Critical Care performed: No  Procedures   MEDICATIONS ORDERED IN ED: Medications  HYDROmorphone (DILAUDID) injection 1 mg (1 mg Intravenous Given 04/26/22 1824)  sodium chloride 0.9 % bolus 1,000 mL (0  mLs Intravenous Stopped 04/26/22 1952)     IMPRESSION / MDM / ASSESSMENT AND PLAN / ED COURSE  I reviewed the triage vital signs and the nursing notes.  27 year old male with PMH as noted above presents with chest pain, shortness of breath, and generalized muscle pains.  His vital signs are normal except for borderline tachycardia.  Physical exam is otherwise unremarkable for acute findings.  Differential diagnosis includes, but is not limited to, recurrent rhabdomyolysis, other musculoskeletal chest wall pain, acute bronchitis/viral syndrome, dehydration.  I have a low suspicion for ACS.  Although the patient cannot be ruled out by [facility] due to his borderline tachycardia, he has no signs or symptoms of DVT, no risk factors for VTE, no hypoxia or other clinical evidence for PE.  There is no clinical evidence for ACS.  EKG and initial troponin are negative.  Creatinine is normal.  We will obtain  CK, respiratory panel, give fluids, analgesia, and reassess.  Patient's presentation is most consistent with acute presentation with potential threat to life or bodily function.  ----------------------------------------- 7:57 PM on 04/26/2022 -----------------------------------------  Respiratory panel is negative.  CK is 2100 which is elevated but not nearly as high as it has been during the patient's last several admissions.  His pain is under control now and he is feeling well.  I did consider whether he may benefit from admission for IV hydration and further workup, however the patient would strongly prefer to go home and given the normal creatinine and the well-controlled symptoms I think that this is reasonable.  He has received a liter of IV fluid.  The patient states that he has a pain management appointment scheduled 6 days from now.  I have prescribed 20 tablets of oxycodone for pain control until he is able to follow-up.  I gave the patient strict return precautions and he expressed understanding.  FINAL CLINICAL IMPRESSION(S) / ED DIAGNOSES   Final diagnoses:  Atypical chest pain  Non-traumatic rhabdomyolysis     Rx / DC Orders   ED Discharge Orders          Ordered    Oxycodone HCl 10 MG TABS  Every 6 hours PRN        04/26/22 1914             Note:  This document was prepared using Dragon voice recognition software and may include unintentional dictation errors.    Arta Silence, MD 04/26/22 1958

## 2022-04-26 NOTE — ED Notes (Signed)
This nurse rooms patient, patient c/o  legs and back pain at this time.

## 2022-04-26 NOTE — ED Notes (Signed)
Lab contacted to add CK to blood work they have already received

## 2022-04-26 NOTE — Discharge Instructions (Addendum)
Follow-up with pain management as scheduled.  Return to the ER for new, worsening, or persistent severe chest pain, body aches, muscle pain, difficulty breathing, weakness or lightheadedness, or any other new or worsening symptoms that concern you.

## 2022-04-26 NOTE — ED Triage Notes (Signed)
Pt to ED via POV from home. Pt reports he was cleaning his house and started having sharp centralized CP that is non-radiating. Pt also reports dizziness. Pt denies cardiac hx and is not on any daily meds.

## 2022-04-27 ENCOUNTER — Inpatient Hospital Stay
Admission: EM | Admit: 2022-04-27 | Discharge: 2022-04-29 | DRG: 558 | Disposition: A | Discharge status: Home / Self Care | Payer: Medicaid Other | Attending: Internal Medicine | Admitting: Internal Medicine

## 2022-04-27 ENCOUNTER — Other Ambulatory Visit: Payer: Self-pay

## 2022-04-27 DIAGNOSIS — Z87891 Personal history of nicotine dependence: Secondary | ICD-10-CM | POA: Diagnosis not present

## 2022-04-27 DIAGNOSIS — M6282 Rhabdomyolysis: Principal | ICD-10-CM | POA: Diagnosis present

## 2022-04-27 DIAGNOSIS — E871 Hypo-osmolality and hyponatremia: Secondary | ICD-10-CM | POA: Diagnosis present

## 2022-04-27 DIAGNOSIS — E876 Hypokalemia: Secondary | ICD-10-CM | POA: Diagnosis present

## 2022-04-27 DIAGNOSIS — E7404 McArdle disease: Secondary | ICD-10-CM | POA: Diagnosis present

## 2022-04-27 DIAGNOSIS — Z1152 Encounter for screening for COVID-19: Secondary | ICD-10-CM | POA: Diagnosis not present

## 2022-04-27 DIAGNOSIS — Z885 Allergy status to narcotic agent status: Secondary | ICD-10-CM | POA: Diagnosis not present

## 2022-04-27 DIAGNOSIS — Z87442 Personal history of urinary calculi: Secondary | ICD-10-CM

## 2022-04-27 DIAGNOSIS — Z8249 Family history of ischemic heart disease and other diseases of the circulatory system: Secondary | ICD-10-CM

## 2022-04-27 DIAGNOSIS — R0789 Other chest pain: Secondary | ICD-10-CM | POA: Diagnosis present

## 2022-04-27 LAB — BASIC METABOLIC PANEL
Anion gap: 8 (ref 5–15)
BUN: 12 mg/dL (ref 6–20)
CO2: 26 mmol/L (ref 22–32)
Calcium: 9 mg/dL (ref 8.9–10.3)
Chloride: 101 mmol/L (ref 98–111)
Creatinine, Ser: 0.66 mg/dL (ref 0.61–1.24)
GFR, Estimated: >60 mL/min (ref >60)
Glucose, Bld: 89 mg/dL (ref 70–99)
Potassium: 3.7 mmol/L (ref 3.5–5.1)
Sodium: 135 mmol/L (ref 135–145)

## 2022-04-27 LAB — URINALYSIS, ROUTINE W REFLEX MICROSCOPIC
Bilirubin Urine: NEGATIVE
Glucose, UA: NEGATIVE mg/dL
Hgb urine dipstick: NEGATIVE
Ketones, ur: NEGATIVE mg/dL
Leukocytes,Ua: NEGATIVE
Nitrite: NEGATIVE
Protein, ur: NEGATIVE mg/dL
Specific Gravity, Urine: 1.013 (ref 1.005–1.030)
pH: 6 (ref 5.0–8.0)

## 2022-04-27 LAB — CBC
HCT: 41.5 % (ref 39.0–52.0)
Hemoglobin: 14.4 g/dL (ref 13.0–17.0)
MCH: 31.4 pg (ref 26.0–34.0)
MCHC: 34.7 g/dL (ref 30.0–36.0)
MCV: 90.4 fL (ref 80.0–100.0)
Platelets: 235 10*3/uL (ref 150–400)
RBC: 4.59 MIL/uL (ref 4.22–5.81)
RDW: 12.8 % (ref 11.5–15.5)
WBC: 5.7 10*3/uL (ref 4.0–10.5)
nRBC: 0 % (ref 0.0–0.2)

## 2022-04-27 LAB — CK: Total CK: 44256 U/L — ABNORMAL HIGH (ref 49–397)

## 2022-04-27 MED ORDER — SODIUM CHLORIDE 0.9 % IV BOLUS
1000.0000 mL | Freq: Once | INTRAVENOUS | Status: AC
  Administered 2022-04-27: 1000 mL via INTRAVENOUS

## 2022-04-27 MED ORDER — HYDROMORPHONE HCL 1 MG/ML IJ SOLN
1.0000 mg | Freq: Once | INTRAMUSCULAR | Status: AC
  Administered 2022-04-27: 1 mg via INTRAVENOUS
  Filled 2022-04-27: qty 1

## 2022-04-27 MED ORDER — HYDROMORPHONE HCL 1 MG/ML IJ SOLN
1.0000 mg | INTRAMUSCULAR | Status: DC | PRN
  Administered 2022-04-27 – 2022-04-29 (×9): 1 mg via INTRAVENOUS
  Filled 2022-04-27 (×9): qty 1

## 2022-04-27 MED ORDER — ENOXAPARIN SODIUM 40 MG/0.4ML IJ SOSY
40.0000 mg | PREFILLED_SYRINGE | INTRAMUSCULAR | Status: DC
  Filled 2022-04-27 (×2): qty 0.4

## 2022-04-27 MED ORDER — SODIUM CHLORIDE 0.9 % IV SOLN
INTRAVENOUS | Status: DC
  Administered 2022-04-28: 1000 mL via INTRAVENOUS

## 2022-04-27 MED ORDER — ONDANSETRON HCL 4 MG/2ML IJ SOLN: 4.0000 mg | Freq: Three times a day (TID) | INTRAMUSCULAR | Status: DC | PRN

## 2022-04-27 MED ORDER — MORPHINE SULFATE (PF) 4 MG/ML IV SOLN
4.0000 mg | Freq: Once | INTRAVENOUS | Status: DC
  Filled 2022-04-27: qty 1

## 2022-04-27 MED ORDER — OXYCODONE HCL 5 MG PO TABS
5.0000 mg | ORAL_TABLET | ORAL | Status: DC | PRN
  Administered 2022-04-27 – 2022-04-29 (×5): 5 mg via ORAL
  Filled 2022-04-27 (×6): qty 1

## 2022-04-27 MED ORDER — ONDANSETRON HCL 4 MG/2ML IJ SOLN
4.0000 mg | Freq: Once | INTRAMUSCULAR | Status: AC
  Administered 2022-04-27: 4 mg via INTRAVENOUS
  Filled 2022-04-27: qty 2

## 2022-04-27 NOTE — ED Notes (Signed)
ED TO INPATIENT HANDOFF REPORT  ED Nurse Name and Phone #: Baxter Flattery, RN    S Name/Age/Gender Verita Lamb 27 y.o. male Room/Bed: ED01HA/ED01HA  Code Status   Code Status: Prior  Home/SNF/Other Home Patient oriented to: self, place, time, and situation Is this baseline? Yes   Triage Complete: Triage complete  Chief Complaint Rhabdomyolysis [M62.82]  Triage Note Pt here for readmission due to leaving yesterday when he was advised to be admitted. Pt states he is having body aches and weakness, pt states no change from yesterday. NAD noted. Pt ambulatory with steady gait to triage.    Allergies Allergies  Allergen Reactions   Morphine Rash    Level of Care/Admitting Diagnosis ED Disposition     ED Disposition  Admit   Condition  --   Comment  Hospital Area: [place] [[id number]]  Level of Care: Med-Surg [16]  Covid Evaluation: Asymptomatic - no recent exposure (last 10 days) testing not required  Diagnosis: Rhabdomyolysis [728.88.ICD-9-CM]  Admitting Physician: Ivor Costa [[id number]]  Attending Physician: Ivor Costa [id number]  Certification:: I certify this patient will need inpatient services for at least 2 midnights  Estimated Length of Stay: 2          B Medical/Surgery History Past Medical History:  Diagnosis Date   Kidney stones    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 No pertinent surgical history.   A IV Location/Drains/Wounds Patient Lines/Drains/Airways Status     Active Line/Drains/Airways     Name Placement date Placement time Site Days   Peripheral IV 04/27/22 20 G 1" Anterior;Left Forearm 04/27/22  1332  Forearm  less than 1            Intake/Output Last 24 hours No intake or output data in the 24 hours ending 04/27/22 1559  Labs/Imaging Results for orders placed or performed during the hospital encounter of 04/27/22 (from the past 48 hour(s))  Urinalysis, Routine  w reflex microscopic -Urine, Clean Catch     Status: Abnormal   Collection Time: 04/27/22  1:00 PM  Result Value Ref Range   Color, Urine STRAW (A) YELLOW   APPearance CLEAR (A) CLEAR   Specific Gravity, Urine 1.013 1.005 - 1.030   pH 6.0 5.0 - 8.0   Glucose, UA NEGATIVE NEGATIVE mg/dL   Hgb urine dipstick NEGATIVE NEGATIVE   Bilirubin Urine NEGATIVE NEGATIVE   Ketones, ur NEGATIVE NEGATIVE mg/dL   Protein, ur NEGATIVE NEGATIVE mg/dL   Nitrite NEGATIVE NEGATIVE   Leukocytes,Ua NEGATIVE NEGATIVE    Comment: Performed at [facility], [address] [place], [place] [id number]  Basic metabolic panel     Status: None   Collection Time: 04/27/22  1:30 PM  Result Value Ref Range   Sodium 135 135 - 145 mmol/L   Potassium 3.7 3.5 - 5.1 mmol/L   Chloride 101 98 - 111 mmol/L   CO2 26 22 - 32 mmol/L   Glucose, Bld 89 70 - 99 mg/dL    Comment: Glucose reference range applies only to samples taken after fasting for at least 8 hours.   BUN 12 6 - 20 mg/dL   Creatinine, Ser 0.66 0.61 - 1.24 mg/dL   Calcium 9.0 8.9 - 10.3 mg/dL   GFR, Estimated >60 >60 mL/min    Comment: (NOTE) Calculated using the CKD-EPI Creatinine Equation (2021)    Anion gap 8 5 - 15    Comment: Performed at  [facility], [address] [place], [place] [address]  CBC     Status: None   Collection Time: 04/27/22  1:30 PM  Result Value Ref Range   WBC 5.7 4.0 - 10.5 K/uL   RBC 4.59 4.22 - 5.81 MIL/uL   Hemoglobin 14.4 13.0 - 17.0 g/dL   HCT 41.5 39.0 - 52.0 %   MCV 90.4 80.0 - 100.0 fL   MCH 31.4 26.0 - 34.0 pg   MCHC 34.7 30.0 - 36.0 g/dL   RDW 12.8 11.5 - 15.5 %   Platelets 235 150 - 400 K/uL   nRBC 0.0 0.0 - 0.2 %    Comment: Performed at [facility], [address] [place], [place] [address]  CK     Status: Abnormal   Collection Time: 04/27/22  1:33 PM  Result Value Ref Range   Total CK 44,256 (H) 49 - 397 U/L    Comment: RESULT CONFIRMED BY MANUAL DILUTION KLW Performed at  [facility], [address] [place], [place] [address]    DG Chest 2 View  Result Date: 04/26/2022 CLINICAL DATA:  History of muscular disorder and pericarditis with chest pain and dizziness EXAM: CHEST - 2 VIEW COMPARISON:  Chest radiograph dated 03/31/2022 FINDINGS: Normal lung volumes. No focal consolidations. No pleural effusion or pneumothorax. The heart size and mediastinal contours are within normal limits. The visualized skeletal structures are unremarkable. IMPRESSION: No active cardiopulmonary disease. Electronically Signed   By: Darrin Nipper M.D.   On: 04/26/2022 17:46    Pending Labs Unresulted Labs (From admission, onward)    None       Vitals/Pain Today's Vitals   04/27/22 1310 04/27/22 1311 04/27/22 1319  BP:  124/81   Pulse:  91   Resp:  18   Temp:  98 F (36.7 C)   TempSrc:  Oral   SpO2:  98%   Weight: 140 lb (63.5 kg)    Height: '5\' 9"'$  (1.753 m)    PainSc:   6     Isolation Precautions No active isolations  Medications Medications  sodium chloride 0.9 % bolus 1,000 mL (has no administration in time range)  0.9 %  sodium chloride infusion (has no administration in time range)  oxyCODONE (Oxy IR/ROXICODONE) immediate release tablet 5 mg (has no administration in time range)  sodium chloride 0.9 % bolus 1,000 mL (0 mLs Intravenous Stopped 04/27/22 1421)  ondansetron (ZOFRAN) injection 4 mg (4 mg Intravenous Given 04/27/22 1342)  HYDROmorphone (DILAUDID) injection 1 mg (1 mg Intravenous Given 04/27/22 1343)  HYDROmorphone (DILAUDID) injection 1 mg (1 mg Intravenous Given 04/27/22 1545)    Mobility walks     Focused Assessments Cardiac Assessment Handoff:    Lab Results  Component Value Date   CKTOTAL 44,256 (H) 04/27/2022   Lab Results  Component Value Date   DDIMER <0.27 05/22/2019   Does the Patient currently have chest pain? No    R Recommendations: See Admitting Provider Note  Report given to:   Additional Notes:

## 2022-04-27 NOTE — ED Provider Notes (Signed)
[facility] Provider Note    Event Date/Time   First MD Initiated Contact with Patient 04/27/22 1332     (approximate)  History   Chief Complaint: Weakness  HPI  ZELMER MEUSE is a 27 y.o. male with a past medical history of McArdle's disease with recurrent rhabdomyolysis who presents to the emergency department for worsening pain and darker urine.  According to the patient he was seen here yesterday for the same had an elevated CK of 2100.  Patient states ultimately he was feeling better and decided to go home with oral pain medications.  He states since going home his pain has worsened and he does not feel like he has home pain medications are managing his discomfort he also states he urinated this morning and noted.  Much darker than it did yesterday he was concerned that his rhabdomyolysis could be worsening so he came to the emergency department for evaluation.  Describes generalized muscular pain.  No focal pain.  Denies any fever.  Physical Exam   Triage Vital Signs: ED Triage Vitals  Enc Vitals Group     BP 04/27/22 1311 124/81     Pulse Rate 04/27/22 1311 91     Resp 04/27/22 1311 18     Temp 04/27/22 1311 98 F (36.7 C)     Temp Source 04/27/22 1311 Oral     SpO2 04/27/22 1311 98 %     Weight 04/27/22 1310 140 lb (63.5 kg)     Height 04/27/22 1310 '5\' 9"'$  (1.753 m)     Head Circumference --      Peak Flow --      Pain Score 04/27/22 1319 6     Pain Loc --      Pain Edu? --      Excl. in [place]? --     Most recent vital signs: Vitals:   04/27/22 1311  BP: 124/81  Pulse: 91  Resp: 18  Temp: 98 F (36.7 C)  SpO2: 98%    General: Awake, no distress.  CV:  Good peripheral perfusion.  Regular rate and rhythm  Resp:  Normal effort.  Equal breath sounds bilaterally.  Abd:  No distention.  Soft, nontender.  No rebound or guarding.    ED Results / Procedures / Treatments    MEDICATIONS ORDERED IN ED: Medications  sodium chloride 0.9 %  bolus 1,000 mL (has no administration in time range)  morphine (PF) 4 MG/ML injection 4 mg (has no administration in time range)  ondansetron (ZOFRAN) injection 4 mg (has no administration in time range)     IMPRESSION / MDM / ASSESSMENT AND PLAN / ED COURSE  I reviewed the triage vital signs and the nursing notes.  Patient's presentation is most consistent with acute presentation with potential threat to life or bodily function.  Patient presents emergency department for worsening generalized muscular pain as well as darker urine.  Patient was seen here yesterday I reviewed the patient's note as well as lab findings had a CK of 2100.  Patient has a history of McArdle's disease with recurrent rhabdomyolysis.  We will IV hydrate we will recheck labs including a CK we will treat the patient's pain while awaiting results.  Patient is agreeable to plan of care.  Patient's chemistry is reassuring, CBC is reassuring.  Urinalysis shows no concerning findings.  Patient's CK however has gone from 2100 yesterday to 44,000 today.  I have ordered a second bag of IV fluids  for the patient we will continue to treat pain.  Will admit to the hospital service for rhabdomyolysis.  FINAL CLINICAL IMPRESSION(S) / ED DIAGNOSES   Rhabdomyolysis Muscular pain  Note:  This document was prepared using Dragon voice recognition software and may include unintentional dictation errors.   Harvest Dark, MD 04/27/22 1531

## 2022-04-27 NOTE — ED Triage Notes (Signed)
Pt here for readmission due to leaving yesterday when he was advised to be admitted. Pt states he is having body aches and weakness, pt states no change from yesterday. NAD noted. Pt ambulatory with steady gait to triage.

## 2022-04-27 NOTE — H&P (Signed)
History and Physical    Roberto Stanton DOB: December 14, 1995 DOA: 04/27/2022  Referring MD/NP/PA:   PCP: Rutherford Limerick, PA   Patient coming from:  The patient is coming from home.       Chief Complaint: Muscle pain all over  HPI: Roberto Stanton is a 27 y.o. male with medical history significant of  McArdle's disease with recurrent rhabdomyolysis, pericarditis, who presents with muscle pain all over.  Patient patient has hx of recurrent rhabdomyolysis.  Recent admission was 2/10 - 2/14.  He developed pain all over again.  Patient was seen in ED yesterday, and found to have CK level 2100. Patient states ultimately he was feeling better and decided to go home with oral pain medications.  He states since going home his pain has worsened.  He reports muscle pain all over, particularly in both shoulders and both legs.  The pain is constant, severe, aching, nonradiating.  He states that his urine is much darker today. Denies chest pain, cough, shortness breath.  No nausea vomiting, diarrhea or abdominal pain.  No symptoms of UTI.  No fever or chills.  Data reviewed independently and ED Course: [place] [address], WBC 5.7, GFR> 60, negative urinalysis.  Temperature normal, blood pressure 124/81, heart rate 106, RR 18, oxygen saturation 98% on room air.  Patient had a negative chest x-ray yesterday.  PCR negative for COVID, flu and RSV yesterday.  Patient is admitted to [facility] bed as inpatient   EKG: I have personally reviewed.  Sinus rhythm, QTc 425, poor R wave progression, low voltage   Review of Systems:   General: no fevers, chills, no body weight gain, has fatigue HEENT: no blurry vision, hearing changes or sore throat Respiratory: no dyspnea, coughing, wheezing CV: no chest pain, no palpitations GI: no nausea, vomiting, abdominal pain, diarrhea, constipation GU: no dysuria, burning on urination, increased urinary frequency, hematuria .  Has dark urine Ext: no  leg edema Neuro: no unilateral weakness, numbness, or tingling, no vision change or hearing loss Skin: no rash, no skin tear. MSK: Has severe muscle pain all over. Heme: No easy bruising.  Travel history: No recent long distant travel.   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 No pertinent surgical history.  Social History:  reports that he has quit smoking. He has never used smokeless tobacco. He reports that he does not currently use alcohol. He reports that he does not currently use drugs after having used the following drugs: Marijuana.  Family History:  Family History  Problem Relation Age of Onset   Heart disease Father      Prior to Admission medications   Medication Sig Start Date End Date Taking? Authorizing Provider  Oxycodone HCl 10 MG TABS Take 1 tablet (10 mg total) by mouth every 6 (six) hours as needed for up to 7 days (Pain). 04/26/22 05/03/22  Arta Silence, MD    Physical Exam: Vitals:   04/27/22 1310 04/27/22 1311 04/27/22 1559 04/27/22 1652  BP:  124/81 129/86 122/79  Pulse:  91 94 79  Resp:  '18 18 18  '$ Temp:  98 F (36.7 C) 98 F (36.7 C) 98.2 F (36.8 C)  TempSrc:  Oral    SpO2:  98% 98% 99%  Weight: 63.5 kg     Height: '5\' 9"'$  (1.753 m)      General: Not in acute distress HEENT:       Eyes: PERRL, EOMI, no scleral icterus.       ENT: No discharge from the ears and nose, no pharynx injection, no tonsillar enlargement.        Neck: No JVD, no bruit, no mass felt. Heme: No neck lymph node enlargement. Cardiac: S1/S2, RRR, No murmurs, No gallops or rubs. Respiratory: No rales, wheezing, rhonchi or rubs. GI: Soft, nondistended, nontender, no rebound pain, no organomegaly, BS present. GU: No hematuria Ext: No pitting leg edema bilaterally. 1+DP/PT pulse  bilaterally. Musculoskeletal: Has muscle pain all over. Skin: No rashes.  Neuro: Alert, oriented X3, cranial nerves II-XII grossly intact, moves all extremities normally.  Psych: Patient is not psychotic, no suicidal or hemocidal ideation.  Labs on Admission: I have personally reviewed following labs and imaging studies  CBC: Recent Labs  Lab 04/26/22 1658 04/27/22 1330  WBC 7.1 5.7  HGB 14.8 14.4  HCT 43.0 41.5  MCV 89.4 90.4  PLT 256 [id number]   Basic Metabolic Panel: Recent Labs  Lab 04/26/22 1658 04/27/22 1330  NA 134* 135  K 3.7 3.7  CL 100 101  CO2 26 26  GLUCOSE 107* 89  BUN 17 12  CREATININE 0.66 0.66  CALCIUM 9.1 9.0   GFR: Estimated Creatinine Clearance: 123.5 mL/min (by C-G formula based on SCr of 0.66 mg/dL). Liver Function Tests: No results for input(s): "AST", "ALT", "ALKPHOS", "BILITOT", "PROT", "ALBUMIN" in the last 168 hours. No results for input(s): "LIPASE", "AMYLASE" in the last 168 hours. No results for input(s): "AMMONIA" in the last 168 hours. Coagulation Profile: No results for input(s): "INR", "PROTIME" in the last 168 hours. Cardiac Enzymes: Recent Labs  Lab 04/26/22 1658 04/27/22 1333  CKTOTAL 2,139* 44,256*   BNP (last 3 results) No results for input(s): "PROBNP" in the last 8760 hours. HbA1C: No results for input(s): "HGBA1C" in the last 72 hours. CBG: No results for input(s): "GLUCAP" in the last 168 hours. Lipid Profile: No results for input(s): "CHOL", "HDL", "LDLCALC", "TRIG", "CHOLHDL", "LDLDIRECT" in the last 72 hours. Thyroid Function Tests: No results for input(s): "TSH", "T4TOTAL", "FREET4", "T3FREE", "THYROIDAB" in the last 72 hours. Anemia Panel: No results for input(s): "VITAMINB12", "FOLATE", "FERRITIN", "TIBC", "IRON", "RETICCTPCT" in the last 72 hours. Urine analysis:    Component Value Date/Time   COLORURINE STRAW (A) 04/27/2022 1300   APPEARANCEUR CLEAR (A) 04/27/2022 1300   LABSPEC 1.013 04/27/2022 1300    PHURINE 6.0 04/27/2022 1300   GLUCOSEU NEGATIVE 04/27/2022 1300   HGBUR NEGATIVE 04/27/2022 1300   BILIRUBINUR NEGATIVE 04/27/2022 1300   KETONESUR NEGATIVE 04/27/2022 1300   PROTEINUR NEGATIVE 04/27/2022 1300   NITRITE NEGATIVE 04/27/2022 1300   LEUKOCYTESUR NEGATIVE 04/27/2022 1300   Sepsis Labs: '@LABRCNTIP'$ (procalcitonin:4,lacticidven:4) ) Recent Results (from the past 240 hour(s))  Resp panel by RT-PCR (RSV, Flu A&B, Covid) Anterior Nasal Swab     Status: None   Collection Time: 04/26/22  6:21 PM   Specimen: Anterior Nasal Swab  Result Value Ref Range Status   SARS Coronavirus 2 by RT PCR NEGATIVE NEGATIVE Final    Comment: (NOTE) SARS-CoV-2 target nucleic acids are NOT DETECTED.  The SARS-CoV-2 RNA is generally detectable in upper respiratory specimens during the acute phase of infection. The lowest concentration of SARS-CoV-2 viral copies this assay can detect is 138 copies/mL. A negative result does not preclude SARS-Cov-2 infection  and should not be used as the sole basis for treatment or other patient management decisions. A negative result may occur with  improper specimen collection/handling, submission of specimen other than nasopharyngeal swab, presence of viral mutation(s) within the areas targeted by this assay, and inadequate number of viral copies(<138 copies/mL). A negative result must be combined with clinical observations, patient history, and epidemiological information. The expected result is Negative.  Fact Sheet for Patients:  EntrepreneurPulse.com.au  Fact Sheet for Healthcare Providers:  IncredibleEmployment.be  This test is no t yet approved or cleared by the [place] FDA and  has been authorized for detection and/or diagnosis of SARS-CoV-2 by FDA under an Emergency Use Authorization (EUA). This EUA will remain  in effect (meaning this test can be used) for the duration of the COVID-19 declaration under  Section 564(b)(1) of the Act, 21 U.S.C.section 360bbb-3(b)(1), unless the authorization is terminated  or revoked sooner.       Influenza A by PCR NEGATIVE NEGATIVE Final   Influenza B by PCR NEGATIVE NEGATIVE Final    Comment: (NOTE) The Xpert Xpress SARS-CoV-2/FLU/RSV plus assay is intended as an aid in the diagnosis of influenza from Nasopharyngeal swab specimens and should not be used as a sole basis for treatment. Nasal washings and aspirates are unacceptable for Xpert Xpress SARS-CoV-2/FLU/RSV testing.  Fact Sheet for Patients: EntrepreneurPulse.com.au  Fact Sheet for Healthcare Providers: IncredibleEmployment.be  This test is not yet approved or cleared by the [place] FDA and has been authorized for detection and/or diagnosis of SARS-CoV-2 by FDA under an Emergency Use Authorization (EUA). This EUA will remain in effect (meaning this test can be used) for the duration of the COVID-19 declaration under Section 564(b)(1) of the Act, 21 U.S.C. section 360bbb-3(b)(1), unless the authorization is terminated or revoked.     Resp Syncytial Virus by PCR NEGATIVE NEGATIVE Final    Comment: (NOTE) Fact Sheet for Patients: EntrepreneurPulse.com.au  Fact Sheet for Healthcare Providers: IncredibleEmployment.be  This test is not yet approved or cleared by the [place] FDA and has been authorized for detection and/or diagnosis of SARS-CoV-2 by FDA under an Emergency Use Authorization (EUA). This EUA will remain in effect (meaning this test can be used) for the duration of the COVID-19 declaration under Section 564(b)(1) of the Act, 21 U.S.C. section 360bbb-3(b)(1), unless the authorization is terminated or revoked.  Performed at [facility], [address] [place], [place] [address]      Radiological Exams on Admission: DG Chest 2 View  Result Date: 04/26/2022 CLINICAL DATA:   History of muscular disorder and pericarditis with chest pain and dizziness EXAM: CHEST - 2 VIEW COMPARISON:  Chest radiograph dated 03/31/2022 FINDINGS: Normal lung volumes. No focal consolidations. No pleural effusion or pneumothorax. The heart size and mediastinal contours are within normal limits. The visualized skeletal structures are unremarkable. IMPRESSION: No active cardiopulmonary disease. Electronically Signed   By: Darrin Nipper M.D.   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  GFR> 60.  -Admitted to MedSurg bed as inpatient -IV fluid: 2 L normal saline, then 25 cc/h -Repeat CK level in morning -Pain control: As needed oxycodone and Dilaudid     DVT ppx: SQ Lovenox  Code Status: Full code  Family Communication: not done, no family member is at bed side.     Disposition Plan:  Anticipate discharge back to previous environment  Consults called:  none  Admission status and Level of care: Med-Surg:    as inpt      Dispo: The patient is from: Home              Anticipated d/c is to: Home              Anticipated d/c date is: 2 days              Patient currently is not medically stable to d/c.    Severity of Illness:  The appropriate patient status for this patient is INPATIENT. Inpatient status is judged to be reasonable and necessary in order to provide the required intensity of service to ensure the patient's safety. The patient's presenting symptoms, physical exam findings, and initial radiographic and laboratory data in the context of their chronic comorbidities is felt to place them at high risk for further clinical deterioration. Furthermore, it is not  anticipated that the patient will be medically stable for discharge from the hospital within 2 midnights of admission.   * I certify that at the point of admission it is my clinical judgment that the patient will require inpatient hospital care spanning beyond 2 midnights from the point of admission due to high intensity of service, high risk for further deterioration and high frequency of surveillance required.*       Date of Service 04/27/2022    Ivor Costa [facility]   If 7PM-7AM, please contact night-coverage [email or web address] 04/27/2022, 6:46 PM

## 2022-04-28 LAB — BASIC METABOLIC PANEL WITH GFR
Anion gap: 7 (ref 5–15)
BUN: 8 mg/dL (ref 6–20)
CO2: 27 mmol/L (ref 22–32)
Calcium: 8.9 mg/dL (ref 8.9–10.3)
Chloride: 103 mmol/L (ref 98–111)
Creatinine, Ser: 0.56 mg/dL — ABNORMAL LOW (ref 0.61–1.24)
GFR, Estimated: >60 mL/min
Glucose, Bld: 94 mg/dL (ref 70–99)
Potassium: 3.7 mmol/L (ref 3.5–5.1)
Sodium: 137 mmol/L (ref 135–145)

## 2022-04-28 LAB — CK: Total CK: 35449 U/L — ABNORMAL HIGH (ref 49–397)

## 2022-04-28 NOTE — Hospital Course (Signed)
Roberto Stanton is a 27 y.o. male with medical history significant of  McArdle's disease with recurrent rhabdomyolysis, pericarditis, who presents with muscle pain all over.  Patient has recurrent rhabdomyolysis every 1 to 2 weeks. He has been placed on pain medicine as well as IV fluids.

## 2022-04-28 NOTE — Progress Notes (Signed)
  Progress Note   Patient: Roberto Stanton OAC:[id number] DOB: 1995/02/20 DOA: 04/27/2022     1 DOS: the patient was seen and examined on 04/28/2022   Brief hospital course: Roberto Stanton is a 27 y.o. male with medical history significant of  McArdle's disease with recurrent rhabdomyolysis, pericarditis, who presents with muscle pain all over.  Patient has recurrent rhabdomyolysis every 1 to 2 weeks. He has been placed on pain medicine as well as IV fluids.   Principal Problem:   Non-traumatic rhabdomyolysis, recurrent Active Problems:   Myophosphorylase deficiency (glycogen storage disease V, McArdle disease) (HCC)   Assessment and Plan: Non-traumatic rhabdomyolysis, recurrent due to myophosphorylase deficiency (glycogen storage disease V, McArdle disease)  Patient was treated with IV fluids and pain medicine. CK level started coming down, will keep patient for another day. Currently, there is no option to prevent rhabdomyolysis from patient condition.      Subjective:  Patient still has muscle pain, otherwise doing well.  No nausea vomiting or diarrhea.  Physical Exam: Vitals:   04/27/22 1559 04/27/22 1652 04/28/22 0526 04/28/22 0751  BP: 129/86 122/79 105/71 119/73  Pulse: 94 79 61 70  Resp: 18 18 16 16   Temp: 98 F (36.7 C) 98.2 F (36.8 C) 97.9 F (36.6 C) 97.7 F (36.5 C)  TempSrc:   Oral   SpO2: 98% 99% 93% 100%  Weight:      Height:       General exam: Appears calm and comfortable  Respiratory system: Clear to auscultation. Respiratory effort normal. Cardiovascular system: S1 & S2 heard, RRR. No JVD, murmurs, rubs, gallops or clicks. No pedal edema. Gastrointestinal system: Abdomen is nondistended, soft and nontender. No organomegaly or masses felt. Normal bowel sounds heard. Central nervous system: Alert and oriented. No focal neurological deficits. Extremities: Symmetric 5 x 5 power. Skin: No rashes, lesions or ulcers Psychiatry: Judgement and insight  appear normal. Mood & affect appropriate.    Data Reviewed:  Lab results reviewed.  Family Communication: None  Disposition: Status is: Inpatient Remains inpatient appropriate because: Severity of disease, IV treatment.     Time spent: 35 minutes  Author: Sharen Hones, MD 04/28/2022 12:24 PM  For on call review [email or web address].

## 2022-04-29 DIAGNOSIS — E876 Hypokalemia: Secondary | ICD-10-CM | POA: Insufficient documentation

## 2022-04-29 LAB — BASIC METABOLIC PANEL
Anion gap: 9 (ref 5–15)
BUN: 8 mg/dL (ref 6–20)
CO2: 25 mmol/L (ref 22–32)
Calcium: 8.8 mg/dL — ABNORMAL LOW (ref 8.9–10.3)
Chloride: 101 mmol/L (ref 98–111)
Creatinine, Ser: 0.63 mg/dL (ref 0.61–1.24)
GFR, Estimated: >60 mL/min (ref >60)
Glucose, Bld: 97 mg/dL (ref 70–99)
Potassium: 3.4 mmol/L — ABNORMAL LOW (ref 3.5–5.1)
Sodium: 135 mmol/L (ref 135–145)

## 2022-04-29 LAB — PHOSPHORUS: Phosphorus: 4.2 mg/dL (ref 2.5–4.6)

## 2022-04-29 LAB — MAGNESIUM: Magnesium: 1.7 mg/dL (ref 1.7–2.4)

## 2022-04-29 LAB — CK: Total CK: 19304 U/L — ABNORMAL HIGH (ref 49–397)

## 2022-04-29 MED ORDER — OXYCODONE HCL 10 MG PO TABS: 10.0000 mg | ORAL_TABLET | Freq: Four times a day (QID) | ORAL | 0 refills | Status: AC | PRN

## 2022-04-29 MED ORDER — POTASSIUM CHLORIDE CRYS ER 20 MEQ PO TBCR
40.0000 meq | EXTENDED_RELEASE_TABLET | ORAL | Status: DC
  Filled 2022-04-29 (×2): qty 2

## 2022-04-29 MED ORDER — POTASSIUM CHLORIDE 10 MEQ/100ML IV SOLN: 10.0000 meq | INTRAVENOUS | Status: DC

## 2022-04-29 MED ORDER — MAGNESIUM SULFATE 2 GM/50ML IV SOLN
2.0000 g | Freq: Once | INTRAVENOUS | Status: AC
  Administered 2022-04-29: 2 g via INTRAVENOUS
  Filled 2022-04-29: qty 50

## 2022-04-29 MED ORDER — POTASSIUM CHLORIDE CRYS ER 10 MEQ PO TBCR: 20.0000 meq | EXTENDED_RELEASE_TABLET | Freq: Two times a day (BID) | ORAL | 0 refills | Status: DC

## 2022-04-29 MED ORDER — MAGNESIUM CHLORIDE 64 MG PO TBEC: 1.0000 | DELAYED_RELEASE_TABLET | Freq: Two times a day (BID) | ORAL | 0 refills | Status: AC

## 2022-04-29 NOTE — Discharge Summary (Signed)
Physician Discharge Summary   Patient: Roberto Stanton MRN: [id number] DOB: [birth year]  Admit date:     04/27/2022  Discharge date: 04/29/22  Discharge Physician: Sharen Hones   PCP: Rutherford Limerick, PA   Recommendations at discharge:   Follow-up with PCP in 1 week.  Discharge Diagnoses: Principal Problem:   Non-traumatic rhabdomyolysis, recurrent Active Problems:   Myophosphorylase deficiency (glycogen storage disease V, McArdle disease) (HCC)   Hypokalemia  Resolved Problems:   * No resolved hospital problems. * Hyponatremia.  Hospital Course: Roberto Stanton is a 27 y.o. male with medical history significant of  McArdle's disease with recurrent rhabdomyolysis, pericarditis, who presents with muscle pain all over.  Patient has recurrent rhabdomyolysis every 1 to 2 weeks. He has been placed on pain medicine as well as IV fluids.  Assessment and Plan: Non-traumatic rhabdomyolysis, recurrent due to myophosphorylase deficiency (glycogen storage disease V, McArdle disease)  Patient doing better today, CK level quickly dropped down to 19,000 from 35,000.  Patient no longer wants to stay in the hospital, will discharge based on patient request.  He is advised to drink Gatorade with a total intake of a gallon a day.  Follow-up with PCP in 1 week.  Mild hypokalemia. Will give IV potassium before discharge. Also ordered 2 g of magnesium for magnesium level of 1.7.        Consultants: None Procedures performed: None  Disposition: Home Diet recommendation:  Discharge Diet Orders (From admission, onward)     Start     Ordered   04/29/22 0000  Diet - low sodium heart healthy        04/29/22 1032           Cardiac diet DISCHARGE MEDICATION: Allergies as of 04/29/2022       Reactions   Morphine Rash        Medication List     STOP taking these medications    Cymbalta 30 MG capsule Generic drug: DULoxetine       TAKE these medications    Oxycodone HCl 10  MG Tabs Take 1 tablet (10 mg total) by mouth every 6 (six) hours as needed for up to 5 days (Pain). What changed: Another medication with the same name was removed. Continue taking this medication, and follow the directions you see here.        Follow-up Information     August Luz A, PA Follow up in 1 week(s).   Specialty: Physician Assistant Contact information: [place] [id number] [phone]                Discharge Exam: Danley Danker Weights   04/27/22 1310  Weight: 63.5 kg   General exam: Appears calm and comfortable  Respiratory system: Clear to auscultation. Respiratory effort normal. Cardiovascular system: S1 & S2 heard, RRR. No JVD, murmurs, rubs, gallops or clicks. No pedal edema. Gastrointestinal system: Abdomen is nondistended, soft and nontender. No organomegaly or masses felt. Normal bowel sounds heard. Central nervous system: Alert and oriented. No focal neurological deficits. Extremities: Symmetric 5 x 5 power. Skin: No rashes, lesions or ulcers Psychiatry: Judgement and insight appear normal. Mood & affect appropriate.    Condition at discharge: good  The results of significant diagnostics from this hospitalization (including imaging, microbiology, ancillary and laboratory) are listed below for reference.   Imaging Studies: DG Chest 2 View  Result Date: 04/26/2022 CLINICAL DATA:  History of muscular disorder and pericarditis with chest pain and dizziness EXAM:  CHEST - 2 VIEW COMPARISON:  Chest radiograph dated 03/31/2022 FINDINGS: Normal lung volumes. No focal consolidations. No pleural effusion or pneumothorax. The heart size and mediastinal contours are within normal limits. The visualized skeletal structures are unremarkable. IMPRESSION: No active cardiopulmonary disease. Electronically Signed   By: Darrin Nipper M.D.   On: 04/26/2022 17:46   ECHOCARDIOGRAM COMPLETE  Result Date: 03/31/2022    ECHOCARDIOGRAM REPORT   Patient Name:    Roberto Stanton Date of Exam: 03/31/2022 Medical Rec #:  [id number]        Height:       69.0 in Accession #:    [id number]       Weight:       145.5 lb Date of Birth:  1995-10-10        BSA:          1.805 m Patient Age:    27 years         BP:           124/74 mmHg Patient Gender: M                HR:           68 bpm. 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  Sonographer:     Kathlen Brunswick RDCS Referring Phys:  [id number] Sharen Hones Diagnosing Phys: Ida Rogue MD  Sonographer Comments: Global longitudinal strain was attempted. IMPRESSIONS  1. Left ventricular ejection fraction, by estimation, is 60 to 65%. The left ventricle has normal function. The left ventricle has no regional wall motion abnormalities. Left ventricular diastolic parameters were normal. The average left ventricular global longitudinal strain is -17.8 %. The global longitudinal strain is normal.  2. Right ventricular systolic function is normal. The right ventricular size is normal.  3. The mitral valve is normal in structure. No evidence of mitral valve regurgitation. No evidence of mitral stenosis.  4. The aortic valve is tricuspid. Aortic valve regurgitation is not visualized. No aortic stenosis is present.  5. The inferior vena cava is normal in size with greater than 50% respiratory variability, suggesting right atrial pressure of 3 mmHg. FINDINGS  Left Ventricle: Left ventricular ejection fraction, by estimation, is 60 to 65%. The left ventricle has normal function. The left ventricle has no regional wall motion abnormalities. The average left ventricular global longitudinal strain is -17.8 %. The global longitudinal strain is normal. The left ventricular internal cavity size was normal in size. There is no left ventricular hypertrophy. Left  ventricular diastolic parameters were normal. Right Ventricle: The right ventricular size is normal. No increase in right ventricular wall thickness. Right ventricular systolic function is normal. Left Atrium: Left atrial size was normal in size. Right Atrium: Right atrial size was normal in size. Pericardium: There is no evidence of pericardial effusion. Mitral Valve: The mitral valve is normal in structure. No evidence of mitral valve regurgitation. No evidence of mitral valve stenosis. Tricuspid Valve: The tricuspid valve is normal in structure. Tricuspid valve regurgitation is mild . No evidence of tricuspid stenosis. Aortic Valve: The aortic valve is tricuspid. Aortic valve regurgitation is not visualized. No aortic stenosis is present. Aortic valve peak gradient measures 4.2 mmHg.  Pulmonic Valve: The pulmonic valve was normal in structure. Pulmonic valve regurgitation is mild. No evidence of pulmonic stenosis. Aorta: The aortic root is normal in size and structure. Venous: The inferior vena cava is normal in size with greater than 50% respiratory variability, suggesting right atrial pressure of 3 mmHg. IAS/Shunts: No atrial level shunt detected by color flow Doppler.  LEFT VENTRICLE PLAX 2D LVIDd:         4.10 cm   Diastology LVIDs:         2.90 cm   LV e' medial:    11.60 cm/s LV PW:         0.90 cm   LV E/e' medial:  5.4 LV IVS:        1.00 cm   LV e' lateral:   18.70 cm/s LVOT diam:     2.10 cm   LV E/e' lateral: 3.3 LV SV:         70 LV SV Index:   39        2D Longitudinal Strain LVOT Area:     3.46 cm  2D Strain GLS Avg:     -17.8 %                           3D Volume EF:                          3D EF:        58 %                          LV EDV:       138 ml                          LV ESV:       58 ml                          LV SV:        80 ml RIGHT VENTRICLE RV Basal diam:  3.10 cm RV S prime:     12.50 cm/s TAPSE (M-mode): 1.9 cm LEFT ATRIUM             Index        RIGHT ATRIUM           Index  LA diam:        2.60 cm 1.44 cm/m   RA Area:     11.40 cm LA Vol (A2C):   30.8 ml 17.07 ml/m  RA Volume:   26.90 ml  14.91 ml/m LA Vol (A4C):   17.0 ml 9.42 ml/m LA Biplane Vol: 24.3 ml 13.46 ml/m  AORTIC VALVE                 PULMONIC VALVE AV Area (Vmax): 3.67 cm     PV Vmax:        1.01 m/s AV Vmax:        103.00 cm/s  PV Peak grad:   4.1 mmHg AV Peak Grad:   4.2 mmHg     RVOT Peak grad: 2 mmHg LVOT Vmax:      109.00 cm/s LVOT Vmean:     68.500 cm/s LVOT VTI:       0.201 m  AORTA Ao Root diam: 3.00 cm Ao Asc diam:  2.80 cm MITRAL VALVE               TRICUSPID VALVE MV Area (PHT): 2.17 cm    TV Peak grad:   19.4 mmHg MV Decel Time: 349 msec    TV Vmax:        2.20 m/s MV E velocity: 62.40 cm/s MV A velocity: 41.40 cm/s  SHUNTS MV E/A ratio:  1.51        Systemic VTI:  0.20 m                            Systemic Diam: 2.10 cm Ida Rogue MD Electronically signed by Ida Rogue MD Signature Date/Time: 03/31/2022/2:58:11 PM    Final    DG Chest 2 View  Result Date: 03/31/2022 CLINICAL DATA:  CP EXAM: CHEST - 2 VIEW COMPARISON:  Chest x-ray 03/04/2022 FINDINGS: The heart and mediastinal contours are within normal limits. Hyperinflation of the lungs. No focal consolidation. No pulmonary edema. No pleural effusion. No pneumothorax. No acute osseous abnormality. IMPRESSION: No active cardiopulmonary disease. Electronically Signed   By: Iven Finn M.D.   On: 03/31/2022 00:50    Microbiology: Results for orders placed or performed during the hospital encounter of 04/26/22  Resp panel by RT-PCR (RSV, Flu A&B, Covid) Anterior Nasal Swab     Status: None   Collection Time: 04/26/22  6:21 PM   Specimen: Anterior Nasal Swab  Result Value Ref Range Status   SARS Coronavirus 2 by RT PCR NEGATIVE NEGATIVE Final    Comment: (NOTE) SARS-CoV-2 target nucleic acids are NOT DETECTED.  The SARS-CoV-2 RNA is generally detectable in upper respiratory specimens during the acute phase of infection. The  lowest concentration of SARS-CoV-2 viral copies this assay can detect is 138 copies/mL. A negative result does not preclude SARS-Cov-2 infection and should not be used as the sole basis for treatment or other patient management decisions. A negative result may occur with  improper specimen collection/handling, submission of specimen other than nasopharyngeal swab, presence of viral mutation(s) within the areas targeted by this assay, and inadequate number of viral copies(<138 copies/mL). A negative result must be combined with clinical observations, patient history, and epidemiological information. The expected result is Negative.  Fact Sheet for Patients:  EntrepreneurPulse.com.au  Fact Sheet for Healthcare Providers:  IncredibleEmployment.be  This test is no t yet approved or cleared by the [place] FDA and  has been authorized for detection and/or diagnosis of SARS-CoV-2 by FDA under an Emergency Use Authorization (EUA). This EUA will remain  in effect (meaning this test can be used) for the duration of the COVID-19 declaration under Section 564(b)(1) of the Act, 21 U.S.C.section 360bbb-3(b)(1), unless the authorization is terminated  or revoked sooner.       Influenza A by PCR NEGATIVE NEGATIVE Final   Influenza B by PCR NEGATIVE NEGATIVE Final    Comment: (NOTE) The Xpert Xpress SARS-CoV-2/FLU/RSV plus assay is intended as an aid in the diagnosis of influenza from Nasopharyngeal swab specimens and should not be used as a sole basis for treatment. Nasal washings and aspirates are unacceptable for Xpert Xpress SARS-CoV-2/FLU/RSV testing.  Fact Sheet for Patients: EntrepreneurPulse.com.au  Fact Sheet for Healthcare Providers: IncredibleEmployment.be  This test is not yet approved or cleared by the [place] FDA and has been authorized for detection and/or diagnosis of SARS-CoV-2 by FDA under  an Emergency Use Authorization (EUA). This EUA will remain in effect (meaning  this test can be used) for the duration of the COVID-19 declaration under Section 564(b)(1) of the Act, 21 U.S.C. section 360bbb-3(b)(1), unless the authorization is terminated or revoked.     Resp Syncytial Virus by PCR NEGATIVE NEGATIVE Final    Comment: (NOTE) Fact Sheet for Patients: EntrepreneurPulse.com.au  Fact Sheet for Healthcare Providers: IncredibleEmployment.be  This test is not yet approved or cleared by the [place] FDA and has been authorized for detection and/or diagnosis of SARS-CoV-2 by FDA under an Emergency Use Authorization (EUA). This EUA will remain in effect (meaning this test can be used) for the duration of the COVID-19 declaration under Section 564(b)(1) of the Act, 21 U.S.C. section 360bbb-3(b)(1), unless the authorization is terminated or revoked.  Performed at [facility], [address] [place], [place] [address]     Labs: CBC: Recent Labs  Lab 04/26/22 1658 04/27/22 1330  WBC 7.1 5.7  HGB 14.8 14.4  HCT 43.0 41.5  MCV 89.4 90.4  PLT 256 [id number]   Basic Metabolic Panel: Recent Labs  Lab 04/26/22 1658 04/27/22 1330 04/28/22 0509 04/29/22 0501  NA 134* 135 137 135  K 3.7 3.7 3.7 3.4*  CL 100 101 103 101  CO2 '26 26 27 25  '$ GLUCOSE 107* 89 94 97  BUN '17 12 8 8  '$ CREATININE 0.66 0.66 0.56* 0.63  CALCIUM 9.1 9.0 8.9 8.8*  MG  --   --   --  1.7  PHOS  --   --   --  4.2   Liver Function Tests: No results for input(s): "AST", "ALT", "ALKPHOS", "BILITOT", "PROT", "ALBUMIN" in the last 168 hours. CBG: No results for input(s): "GLUCAP" in the last 168 hours.  Discharge time spent: greater than 30 minutes.  Signed: Sharen Hones, MD [facility] 04/29/2022

## 2022-04-29 NOTE — Progress Notes (Signed)
Patient is being discharged home. Discharge papers given and explained to patient. He verbalized understanding. Meds and f/u appointment reviewed with patient. Rx sent electronically to the pharmacy. Patient made aware.

## 2022-04-29 NOTE — Progress Notes (Signed)
IV magnesium infusion not completed. Patient requested to stop the infusion due to pain to IV site. IV site WNL and good blood return. Offered to decreased the rate but he refused. Offered another IV site and to start potassium infusion. patient refused and requested to be discharge now. Notified Dr Roosevelt Locks of the above. MD will sent Rx to pharmacy for potassium p.o.Marland Kitchenand will discharge the patient.

## 2022-04-29 NOTE — Progress Notes (Signed)
Patient states "Do not bring me any other meds. I just want my pain med." When questioned, patient states "I will refuse them." Educated on risk of blood clots & need for lovenox. Agrees to IV fluids.

## 2022-05-01 ENCOUNTER — Inpatient Hospital Stay
Admission: EM | Admit: 2022-05-01 | Discharge: 2022-05-02 | DRG: 558 | Disposition: A | Discharge status: Home / Self Care | Payer: Medicaid Other | Attending: Student | Admitting: Student

## 2022-05-01 ENCOUNTER — Other Ambulatory Visit: Payer: Self-pay

## 2022-05-01 DIAGNOSIS — E876 Hypokalemia: Secondary | ICD-10-CM | POA: Diagnosis present

## 2022-05-01 DIAGNOSIS — Z87891 Personal history of nicotine dependence: Secondary | ICD-10-CM

## 2022-05-01 DIAGNOSIS — R748 Abnormal levels of other serum enzymes: Secondary | ICD-10-CM | POA: Diagnosis present

## 2022-05-01 DIAGNOSIS — E7404 McArdle disease: Secondary | ICD-10-CM | POA: Diagnosis present

## 2022-05-01 DIAGNOSIS — R7401 Elevation of levels of liver transaminase levels: Secondary | ICD-10-CM | POA: Diagnosis present

## 2022-05-01 DIAGNOSIS — Z87442 Personal history of urinary calculi: Secondary | ICD-10-CM | POA: Diagnosis not present

## 2022-05-01 DIAGNOSIS — Z8249 Family history of ischemic heart disease and other diseases of the circulatory system: Secondary | ICD-10-CM | POA: Diagnosis not present

## 2022-05-01 DIAGNOSIS — Z885 Allergy status to narcotic agent status: Secondary | ICD-10-CM | POA: Diagnosis not present

## 2022-05-01 DIAGNOSIS — Z833 Family history of diabetes mellitus: Secondary | ICD-10-CM

## 2022-05-01 DIAGNOSIS — M6282 Rhabdomyolysis: Principal | ICD-10-CM | POA: Diagnosis present

## 2022-05-01 LAB — CBC WITH DIFFERENTIAL/PLATELET
Abs Immature Granulocytes: 0.02 10*3/uL (ref 0.00–0.07)
Basophils Absolute: 0 10*3/uL (ref 0.0–0.1)
Basophils Relative: 1 %
Eosinophils Absolute: 0.1 10*3/uL (ref 0.0–0.5)
Eosinophils Relative: 1 %
HCT: 41.7 % (ref 39.0–52.0)
Hemoglobin: 14.6 g/dL (ref 13.0–17.0)
Immature Granulocytes: 0 %
Lymphocytes Relative: 19 %
Lymphs Abs: 1.2 10*3/uL (ref 0.7–4.0)
MCH: 31.5 pg (ref 26.0–34.0)
MCHC: 35 g/dL (ref 30.0–36.0)
MCV: 89.9 fL (ref 80.0–100.0)
Monocytes Absolute: 0.5 10*3/uL (ref 0.1–1.0)
Monocytes Relative: 8 %
Neutro Abs: 4.7 10*3/uL (ref 1.7–7.7)
Neutrophils Relative %: 71 %
Platelets: 254 10*3/uL (ref 150–400)
RBC: 4.64 MIL/uL (ref 4.22–5.81)
RDW: 12.7 % (ref 11.5–15.5)
WBC: 6.6 10*3/uL (ref 4.0–10.5)
nRBC: 0 % (ref 0.0–0.2)

## 2022-05-01 LAB — URINALYSIS, ROUTINE W REFLEX MICROSCOPIC
Bacteria, UA: NONE SEEN
Bilirubin Urine: NEGATIVE
Glucose, UA: NEGATIVE mg/dL
Ketones, ur: NEGATIVE mg/dL
Leukocytes,Ua: NEGATIVE
Nitrite: NEGATIVE
Protein, ur: NEGATIVE mg/dL
Specific Gravity, Urine: 1.003 — ABNORMAL LOW (ref 1.005–1.030)
pH: 7 (ref 5.0–8.0)

## 2022-05-01 LAB — CBC
HCT: 38.8 % — ABNORMAL LOW (ref 39.0–52.0)
Hemoglobin: 13.3 g/dL (ref 13.0–17.0)
MCH: 31.1 pg (ref 26.0–34.0)
MCHC: 34.3 g/dL (ref 30.0–36.0)
MCV: 90.7 fL (ref 80.0–100.0)
Platelets: 145 10*3/uL — ABNORMAL LOW (ref 150–400)
RBC: 4.28 MIL/uL (ref 4.22–5.81)
RDW: 12.7 % (ref 11.5–15.5)
WBC: 6.9 10*3/uL (ref 4.0–10.5)
nRBC: 0 % (ref 0.0–0.2)

## 2022-05-01 LAB — CK
Total CK: 6203 U/L — ABNORMAL HIGH (ref 49–397)
Total CK: 14841 U/L — ABNORMAL HIGH (ref 49–397)
Total CK: 47654 U/L — ABNORMAL HIGH (ref 49–397)

## 2022-05-01 LAB — COMPREHENSIVE METABOLIC PANEL
ALT: 124 U/L — ABNORMAL HIGH (ref 0–44)
AST: 90 U/L — ABNORMAL HIGH (ref 15–41)
Albumin: 4.2 g/dL (ref 3.5–5.0)
Alkaline Phosphatase: 72 U/L (ref 38–126)
Anion gap: 7 (ref 5–15)
BUN: 13 mg/dL (ref 6–20)
CO2: 26 mmol/L (ref 22–32)
Calcium: 8.7 mg/dL — ABNORMAL LOW (ref 8.9–10.3)
Chloride: 103 mmol/L (ref 98–111)
Creatinine, Ser: 0.67 mg/dL (ref 0.61–1.24)
GFR, Estimated: >60 mL/min (ref >60)
Glucose, Bld: 105 mg/dL — ABNORMAL HIGH (ref 70–99)
Potassium: 3.8 mmol/L (ref 3.5–5.1)
Sodium: 136 mmol/L (ref 135–145)
Total Bilirubin: 0.8 mg/dL (ref 0.3–1.2)
Total Protein: 8 g/dL (ref 6.5–8.1)

## 2022-05-01 LAB — GLUCOSE, CAPILLARY: Glucose-Capillary: 95 mg/dL (ref 70–99)

## 2022-05-01 LAB — BASIC METABOLIC PANEL
Anion gap: 3 — ABNORMAL LOW (ref 5–15)
BUN: 10 mg/dL (ref 6–20)
CO2: 26 mmol/L (ref 22–32)
Calcium: 8.6 mg/dL — ABNORMAL LOW (ref 8.9–10.3)
Chloride: 106 mmol/L (ref 98–111)
Creatinine, Ser: 0.6 mg/dL — ABNORMAL LOW (ref 0.61–1.24)
GFR, Estimated: >60 mL/min (ref >60)
Glucose, Bld: 119 mg/dL — ABNORMAL HIGH (ref 70–99)
Potassium: 3.6 mmol/L (ref 3.5–5.1)
Sodium: 135 mmol/L (ref 135–145)

## 2022-05-01 LAB — URINE DRUG SCREEN, QUALITATIVE (ARMC ONLY)
Amphetamines, Ur Screen: NOT DETECTED
Barbiturates, Ur Screen: NOT DETECTED
Benzodiazepine, Ur Scrn: NOT DETECTED
Cannabinoid 50 Ng, Ur ~~LOC~~: NOT DETECTED
Cocaine Metabolite,Ur ~~LOC~~: NOT DETECTED
MDMA (Ecstasy)Ur Screen: NOT DETECTED
Methadone Scn, Ur: NOT DETECTED
Opiate, Ur Screen: POSITIVE — AB
Phencyclidine (PCP) Ur S: NOT DETECTED
Tricyclic, Ur Screen: NOT DETECTED

## 2022-05-01 MED ORDER — IBUPROFEN 400 MG PO TABS
400.0000 mg | ORAL_TABLET | Freq: Three times a day (TID) | ORAL | Status: DC
  Administered 2022-05-01 – 2022-05-02 (×4): 400 mg via ORAL
  Filled 2022-05-01 (×4): qty 1

## 2022-05-01 MED ORDER — LACTATED RINGERS IV BOLUS
1000.0000 mL | Freq: Once | INTRAVENOUS | Status: AC
  Administered 2022-05-01: 1000 mL via INTRAVENOUS

## 2022-05-01 MED ORDER — ONDANSETRON HCL 4 MG PO TABS: 4.0000 mg | ORAL_TABLET | Freq: Four times a day (QID) | ORAL | Status: DC | PRN

## 2022-05-01 MED ORDER — HYDROMORPHONE HCL 1 MG/ML IJ SOLN
0.5000 mg | Freq: Once | INTRAMUSCULAR | Status: AC
  Administered 2022-05-01: 0.5 mg via INTRAVENOUS
  Filled 2022-05-01: qty 1

## 2022-05-01 MED ORDER — HYDROMORPHONE HCL 1 MG/ML IJ SOLN
1.0000 mg | Freq: Once | INTRAMUSCULAR | Status: AC
  Administered 2022-05-01: 1 mg via INTRAVENOUS
  Filled 2022-05-01: qty 1

## 2022-05-01 MED ORDER — ENOXAPARIN SODIUM 40 MG/0.4ML IJ SOSY
40.0000 mg | PREFILLED_SYRINGE | INTRAMUSCULAR | Status: DC
  Filled 2022-05-01: qty 0.4

## 2022-05-01 MED ORDER — HYDROMORPHONE HCL 1 MG/ML IJ SOLN
1.0000 mg | INTRAMUSCULAR | Status: DC | PRN
  Administered 2022-05-01 – 2022-05-02 (×6): 1 mg via INTRAVENOUS
  Filled 2022-05-01 (×6): qty 1

## 2022-05-01 MED ORDER — ACETAMINOPHEN 325 MG PO TABS
650.0000 mg | ORAL_TABLET | Freq: Four times a day (QID) | ORAL | Status: DC | PRN
  Administered 2022-05-01: 650 mg via ORAL
  Filled 2022-05-01: qty 2

## 2022-05-01 MED ORDER — ACETAMINOPHEN 325 MG PO TABS
650.0000 mg | ORAL_TABLET | Freq: Three times a day (TID) | ORAL | Status: DC
  Administered 2022-05-01 – 2022-05-02 (×4): 650 mg via ORAL
  Filled 2022-05-01 (×5): qty 2

## 2022-05-01 MED ORDER — KETOROLAC TROMETHAMINE 15 MG/ML IJ SOLN: 15.0000 mg | Freq: Four times a day (QID) | INTRAMUSCULAR | Status: DC | PRN

## 2022-05-01 MED ORDER — TRAZODONE HCL 50 MG PO TABS: 25.0000 mg | ORAL_TABLET | Freq: Every evening | ORAL | Status: DC | PRN

## 2022-05-01 MED ORDER — SODIUM CHLORIDE 0.9 % IV SOLN: INTRAVENOUS | Status: DC

## 2022-05-01 MED ORDER — OXYCODONE HCL 5 MG PO TABS
10.0000 mg | ORAL_TABLET | Freq: Four times a day (QID) | ORAL | Status: DC | PRN
  Administered 2022-05-01: 10 mg via ORAL
  Filled 2022-05-01: qty 2

## 2022-05-01 MED ORDER — ACETAMINOPHEN 650 MG RE SUPP: 650.0000 mg | Freq: Four times a day (QID) | RECTAL | Status: DC | PRN

## 2022-05-01 MED ORDER — MAGNESIUM CHLORIDE 64 MG PO TBEC
1.0000 | DELAYED_RELEASE_TABLET | Freq: Two times a day (BID) | ORAL | Status: DC
  Administered 2022-05-01 – 2022-05-02 (×3): 64 mg via ORAL
  Filled 2022-05-01 (×3): qty 1

## 2022-05-01 MED ORDER — MAGNESIUM HYDROXIDE 400 MG/5ML PO SUSP: 30.0000 mL | Freq: Every day | ORAL | Status: DC | PRN

## 2022-05-01 MED ORDER — PANTOPRAZOLE SODIUM 40 MG PO TBEC
40.0000 mg | DELAYED_RELEASE_TABLET | Freq: Every day | ORAL | Status: DC
  Administered 2022-05-01 – 2022-05-02 (×2): 40 mg via ORAL
  Filled 2022-05-01 (×2): qty 1

## 2022-05-01 MED ORDER — OXYCODONE HCL 5 MG PO TABS
10.0000 mg | ORAL_TABLET | ORAL | Status: DC | PRN
  Administered 2022-05-01: 10 mg via ORAL
  Filled 2022-05-01: qty 2

## 2022-05-01 MED ORDER — ONDANSETRON HCL 4 MG/2ML IJ SOLN: 4.0000 mg | Freq: Four times a day (QID) | INTRAMUSCULAR | Status: DC | PRN

## 2022-05-01 MED ORDER — POTASSIUM CHLORIDE CRYS ER 20 MEQ PO TBCR
20.0000 meq | EXTENDED_RELEASE_TABLET | Freq: Two times a day (BID) | ORAL | Status: DC
  Administered 2022-05-01 (×2): 20 meq via ORAL
  Filled 2022-05-01 (×2): qty 1

## 2022-05-01 MED ORDER — OXYCODONE HCL 5 MG PO TABS
10.0000 mg | ORAL_TABLET | ORAL | Status: DC | PRN
  Administered 2022-05-01 – 2022-05-02 (×5): 10 mg via ORAL
  Filled 2022-05-01 (×5): qty 2

## 2022-05-01 NOTE — Assessment & Plan Note (Addendum)
-   This is associated with McArdle disease. - The patient will be admitted to a medical bed. - We will continue aggressive hydration with IV normal saline. - We will follow serial CK levels. - Pain management will be provided.

## 2022-05-01 NOTE — ED Provider Notes (Signed)
[facility] Provider Note    None    (approximate)   History   Generalized Body Aches   HPI  Roberto Stanton is a 27 y.o. male who presents to the ED for evaluation of Generalized Body Aches   Patient is well-known to our department with McArdle disease and recurrent atraumatic rhabdomyolysis.  He returns to the ED with diffuse atraumatic myalgias consistent with history of rhabdomyolysis.  No fevers or novel features   Physical Exam   Triage Vital Signs: ED Triage Vitals  Enc Vitals Group     BP 05/01/22 0105 (!) 148/92     Pulse Rate 05/01/22 0105 (!) 109     Resp 05/01/22 0105 18     Temp 05/01/22 0105 (!) 97.4 F (36.3 C)     Temp Source 05/01/22 0105 Oral     SpO2 05/01/22 0105 98 %     Weight 05/01/22 0106 145 lb (65.8 kg)     Height 05/01/22 0106 '5\' 9"'$  (1.753 m)     Head Circumference --      Peak Flow --      Pain Score 05/01/22 0106 8     Pain Loc --      Pain Edu? --      Excl. in [place]? --     Most recent vital signs: Vitals:   05/01/22 0105  BP: (!) 148/92  Pulse: (!) 109  Resp: 18  Temp: (!) 97.4 F (36.3 C)  SpO2: 98%    General: Awake, no distress.  Seems uncomfortable CV:  Good peripheral perfusion.  Resp:  Normal effort.  Abd:  No distention.  MSK:  No deformity noted.  Neuro:  No focal deficits appreciated. Other:     ED Results / Procedures / Treatments   Labs (all labs ordered are listed, but only abnormal results are displayed) Labs Reviewed  COMPREHENSIVE METABOLIC PANEL - Abnormal; Notable for the following components:      Result Value   Glucose, Bld 105 (*)    Calcium 8.7 (*)    AST 90 (*)    ALT 124 (*)    All other components within normal limits  CK - Abnormal; Notable for the following components:   Total CK 6,203 (*)    All other components within normal limits  CBC WITH DIFFERENTIAL/PLATELET  URINALYSIS, ROUTINE W REFLEX MICROSCOPIC    EKG   RADIOLOGY   Official radiology  report(s): No results found.  PROCEDURES and INTERVENTIONS:  Procedures  Medications  lactated ringers bolus 1,000 mL (has no administration in time range)  HYDROmorphone (DILAUDID) injection 1 mg (has no administration in time range)  lactated ringers bolus 1,000 mL (1,000 mLs Intravenous New Bag/Given 05/01/22 0118)  HYDROmorphone (DILAUDID) injection 1 mg (1 mg Intravenous Given 05/01/22 0118)     IMPRESSION / MDM / ASSESSMENT AND PLAN / ED COURSE  I reviewed the triage vital signs and the nursing notes.  Differential diagnosis includes, but is not limited to, sepsis, malingering, viral syndrome, rhabdomyolysis  {Patient presents with symptoms of an acute illness or injury that is potentially life-threatening.  27 year old male returns to the ED with another episode of atraumatic rhabdomyolysis in the setting of his McArdle disease.  Still symptomatic after IV fluid and Dilaudid so we will consult medicine for admission.  CK is elevated to 6000.  Normal renal function and CBC.      FINAL CLINICAL IMPRESSION(S) / ED DIAGNOSES   Final diagnoses:  Non-traumatic rhabdomyolysis  McArdle disease ([place])     Rx / DC Orders   ED Discharge Orders     None        Note:  This document was prepared using Dragon voice recognition software and may include unintentional dictation errors.   Vladimir Crofts, MD 05/01/22 0300

## 2022-05-01 NOTE — ED Notes (Signed)
Pt placed in subwait recliner. Call bell In reach. Portable monitor in use with BP and pulse ox

## 2022-05-01 NOTE — Plan of Care (Signed)
Patient was seen and examined at bedside, admitted due to elevated CK level.  Patient has McArdle disease and recurrent CK level elevated.  Patient was complaining of musculoskeletal pain.  Patient was counseled about pain management, increased oxycodone 10 mg p.o. q. 4 hourly and continued Dilaudid IV as needed for breakthrough pain.  Started Tylenol and ibuprofen as scheduled.  We will continue IV fluid for hydration and monitor CK level.

## 2022-05-01 NOTE — Assessment & Plan Note (Signed)
-   We will continue hydration with IV normal saline. - We will follow LFTs with hydration.

## 2022-05-01 NOTE — H&P (Addendum)
[facility]   PATIENT NAME: Roberto Stanton    MR#:  [id number]  DATE OF BIRTH:  Jan 15, 1996  DATE OF ADMISSION:  05/01/2022  PRIMARY CARE PHYSICIAN: Patient, No Pcp Per   Patient is coming from: Home  REQUESTING/REFERRING PHYSICIAN: Vladimir Crofts, MD  CHIEF COMPLAINT:   Chief Complaint  Patient presents with   Generalized Body Aches    HISTORY OF PRESENT ILLNESS:  Roberto Stanton is a 27 y.o. Caucasian male with medical history significant for McArdle disease, recurrent nontraumatic rhabdomyolysis and pericarditis, who presented to the emergency room with acute onset of generalized myalgia for the last few days similar to his previous rhabdomyolysis episodes for which he was admitted several times here.  He denied any fever or chills.  No falls or injuries.  No chest pain or dyspnea or cough.  No nausea or vomiting or abdominal pain.  No dysuria, oliguria or hematuria or flank pain.  No bleeding diathesis.  ED Course: When the patient came to the ER, BP was 148/92, heart rate 109 and temperature 97.4 with otherwise normal vital signs.  Labs revealed calcium of 8.7, AST 90 ALT 124 with otherwise unremarkable CMP.  CK was 6203.  CBC was within normal. EKG as reviewed by me : On 3/8 EKG showed sinus tachycardia with a rate of 105. Imaging: Two-view chest x-ray on 3/7 showed no acute cardiopulmonary disease.  The patient was given 1 mg of IV Dilaudid twice and 2 L bolus of IV lactated Ringer.  He will be admitted to a medical bed for further evaluation and management. PAST MEDICAL HISTORY:   Past Medical History:  Diagnosis Date   Kidney stones    McArdle disease ([place]) 09/28/2021   Pericarditis    Rhabdomyolysis     PAST SURGICAL HISTORY:  History reviewed. No pertinent surgical history.  No previous surgeries.  SOCIAL HISTORY:   Social History   Tobacco Use   Smoking status: Former   Smokeless tobacco: Never  Substance Use Topics   Alcohol use: Not Currently     Comment: occasional    FAMILY HISTORY:   Family History  Problem Relation Age of Onset   Heart disease Father   Positive for diabetes, hypertension and coronary artery disease Mild.  DRUG ALLERGIES:   Allergies  Allergen Reactions   Morphine Hives    REVIEW OF SYSTEMS:   ROS As per history of present illness. All pertinent systems were reviewed above. Constitutional, HEENT, cardiovascular, respiratory, GI, GU, musculoskeletal, neuro, psychiatric, endocrine, integumentary and hematologic systems were reviewed and are otherwise negative/unremarkable except for positive findings mentioned above in the HPI.   MEDICATIONS AT HOME:   Prior to Admission medications   Medication Sig Start Date End Date Taking? Authorizing Provider  magnesium chloride (SLOW-MAG) 64 MG TBEC SR tablet Take 1 tablet (64 mg total) by mouth 2 (two) times daily for 5 days. 04/29/22 05/04/22  Sharen Hones, MD  Oxycodone HCl 10 MG TABS Take 1 tablet (10 mg total) by mouth every 6 (six) hours as needed for up to 5 days (Pain). 04/29/22 05/04/22  Sharen Hones, MD  potassium chloride (KLOR-CON M) 10 MEQ tablet Take 2 tablets (20 mEq total) by mouth 2 (two) times daily for 3 days. 04/29/22 05/02/22  Sharen Hones, MD      VITAL SIGNS:  Blood pressure 102/66, pulse 90, temperature 98.3 F (36.8 C), resp. rate 17, height 5\' 9"  (1.753 m), weight 64.2 kg, SpO2 100 %.  PHYSICAL EXAMINATION:  Physical Exam  GENERAL:  27 y.o.-year-old Caucasian male patient lying in the bed with no acute distress.  EYES: Pupils equal, round, reactive to light and accommodation. No scleral icterus. Extraocular muscles intact.  HEENT: Head atraumatic, normocephalic. Oropharynx and nasopharynx clear.  NECK:  Supple, no jugular venous distention. No thyroid enlargement, no tenderness.  LUNGS: Normal breath sounds bilaterally, no wheezing, rales,rhonchi or crepitation. No use of accessory muscles of respiration.  CARDIOVASCULAR: Regular rate  and rhythm, S1, S2 normal. No murmurs, rubs, or gallops.  ABDOMEN: Soft, nondistended, nontender. Bowel sounds present. No organomegaly or mass.  EXTREMITIES: No pedal edema, cyanosis, or clubbing.  NEUROLOGIC: Cranial nerves II through XII are intact. Muscle strength 5/5 in all extremities. Sensation intact. Gait not checked. Musculoskeletal: Bilateral upper and lower extremity muscle tenderness. PSYCHIATRIC: The patient is alert and oriented x 3.  Normal affect and good eye contact. SKIN: No obvious rash, lesion, or ulcer.   LABORATORY PANEL:   CBC Recent Labs  Lab 05/22/22 1029  WBC 5.9  HGB 14.5  HCT 42.8  PLT 232   ------------------------------------------------------------------------------------------------------------------  Chemistries  Recent Labs  Lab 05/22/22 1029  NA 137  K 3.7  CL 102  CO2 29  GLUCOSE 92  BUN 12  CREATININE 0.72  CALCIUM 9.0  AST 200*  ALT 114*  ALKPHOS 68  BILITOT 1.3*   ------------------------------------------------------------------------------------------------------------------  Cardiac Enzymes No results for input(s): "TROPONINI" in the last 168 hours. ------------------------------------------------------------------------------------------------------------------  RADIOLOGY:  US Abdomen Limited RUQ (LIVER/GB)  Result Date: 05/22/2022 CLINICAL DATA:  73067 Bilirubinemia 73067 EXAM: ULTRASOUND ABDOMEN LIMITED COMPARISON:  11/01/2019. FINDINGS: The liver demonstrates normal parenchymal echogenicity and homogeneous texture without focal hepatic parenchymal lesions or intrahepatic ductal dilatation. Hepatopetal portal vein. The gallbladder demonstrates no stones, wall thickening or pericholecystic fluid. CBD measured 0.3cm. IMPRESSION: Unremarkable examination of the right upper quadrant. Electronically Signed   By: Sammie Bench M.D.   On: 05/22/2022 14:25      IMPRESSION AND PLAN:  Assessment and Plan: * Non-traumatic  rhabdomyolysis, recurrent - This is associated with McArdle disease. - The patient will be admitted to a medical bed. - We will continue aggressive hydration with IV normal saline. - We will follow serial CK levels. - Pain management will be provided.  Transaminitis - We will continue hydration with IV normal saline. - We will follow LFTs with hydration.   DVT prophylaxis: Lovenox.  Advanced Care Planning:  Code Status: full code.  Family Communication:  The plan of care was discussed in details with the patient (and family). I answered all questions. The patient agreed to proceed with the above mentioned plan. Further management will depend upon hospital course. Disposition Plan: Back to previous home environment Consults called: none.  All the records are reviewed and case discussed with ED provider.  Status is: Inpatient    At the time of the admission, it appears that the appropriate admission status for this patient is inpatient.  This is judged to be reasonable and necessary in order to provide the required intensity of service to ensure the patient's safety given the presenting symptoms, physical exam findings and initial radiographic and laboratory data in the context of comorbid conditions.  The patient requires inpatient status due to high intensity of service, high risk of further deterioration and high frequency of surveillance required.  I certify that at the time of admission, it is my clinical judgment that the patient will require inpatient hospital care extending more than 2  midnights.                            Dispo: The patient is from: Home              Anticipated d/c is to: Home              Patient currently is not medically stable to d/c.              Difficult to place patient: No  Christel Mormon M.D on 05/23/2022 at 6:07 AM  [facility]   From 7 PM-7 AM, contact night-coverage [email or web address]  CC: Primary care physician; Patient, No Pcp Per

## 2022-05-01 NOTE — ED Triage Notes (Addendum)
Ambulatory to triage with c/o body aches and " I think my Ck is elevated." Hx of MCCARDLE disease.  Sx began tonight.

## 2022-05-02 DIAGNOSIS — M6282 Rhabdomyolysis: Principal | ICD-10-CM

## 2022-05-02 LAB — BASIC METABOLIC PANEL
Anion gap: 7 (ref 5–15)
BUN: 8 mg/dL (ref 6–20)
CO2: 25 mmol/L (ref 22–32)
Calcium: 8.4 mg/dL — ABNORMAL LOW (ref 8.9–10.3)
Chloride: 103 mmol/L (ref 98–111)
Creatinine, Ser: 0.58 mg/dL — ABNORMAL LOW (ref 0.61–1.24)
GFR, Estimated: >60 mL/min (ref >60)
Glucose, Bld: 94 mg/dL (ref 70–99)
Potassium: 3.4 mmol/L — ABNORMAL LOW (ref 3.5–5.1)
Sodium: 135 mmol/L (ref 135–145)

## 2022-05-02 LAB — CBC
HCT: 38.2 % — ABNORMAL LOW (ref 39.0–52.0)
Hemoglobin: 13.2 g/dL (ref 13.0–17.0)
MCH: 31.3 pg (ref 26.0–34.0)
MCHC: 34.6 g/dL (ref 30.0–36.0)
MCV: 90.5 fL (ref 80.0–100.0)
Platelets: 218 10*3/uL (ref 150–400)
RBC: 4.22 MIL/uL (ref 4.22–5.81)
RDW: 12.6 % (ref 11.5–15.5)
WBC: 6.2 10*3/uL (ref 4.0–10.5)
nRBC: 0 % (ref 0.0–0.2)

## 2022-05-02 LAB — CK: Total CK: 36251 U/L — ABNORMAL HIGH (ref 49–397)

## 2022-05-02 LAB — PHOSPHORUS: Phosphorus: 3.7 mg/dL (ref 2.5–4.6)

## 2022-05-02 LAB — MAGNESIUM: Magnesium: 1.9 mg/dL (ref 1.7–2.4)

## 2022-05-02 MED ORDER — PANTOPRAZOLE SODIUM 40 MG PO TBEC: 40.0000 mg | DELAYED_RELEASE_TABLET | Freq: Every day | ORAL | 0 refills | Status: DC

## 2022-05-02 MED ORDER — POTASSIUM CHLORIDE CRYS ER 20 MEQ PO TBCR
40.0000 meq | EXTENDED_RELEASE_TABLET | Freq: Once | ORAL | Status: AC
  Administered 2022-05-02: 40 meq via ORAL
  Filled 2022-05-02: qty 2

## 2022-05-02 MED ORDER — ACETAMINOPHEN 325 MG PO TABS: 650.0000 mg | ORAL_TABLET | Freq: Three times a day (TID) | ORAL | Status: DC | PRN

## 2022-05-02 MED ORDER — IBUPROFEN 400 MG PO TABS: 400.0000 mg | ORAL_TABLET | Freq: Three times a day (TID) | ORAL | 0 refills | Status: DC | PRN

## 2022-05-02 NOTE — Discharge Summary (Signed)
[facility] Discharge Summary   Patient: Roberto Stanton [id number]  PCP: Rutherford Limerick, PA  Date of admission: 05/01/2022   Date of discharge:  05/02/2022     Discharge Diagnoses:  Principal Problem:   Non-traumatic rhabdomyolysis, recurrent Active Problems:   Transaminitis   Admitted From: Home Disposition:  Home   Recommendations for Outpatient Follow-up:  Follow with PCP, repeat CK level in 1 to 2 days, continue oral hydration and avoid excessive exercise or physical activity Follow up LABS/TEST:  CK in  1-2 days, LFTs and BMP in 1-2 wks   Diet recommendation: Regular diet  Activity: The patient is advised to gradually reintroduce usual activities, as tolerated  Discharge Condition: stable  Code Status: Full code   History of present illness: As per the H and P dictated on admission Hospital Course:  BRECKON HOUSER is a 27 y.o. Caucasian male with medical history significant for myocardial disease, recurrent nontraumatic rhabdomyolysis and pericarditis, who presented to the emergency room with acute onset of generalized myalgia for the last few days similar to his previous rhabdomyolysis episodes for which he was admitted several times here.  He denied any fever or chills.  No falls or injuries.  No chest pain or dyspnea or cough.  No nausea or vomiting or abdominal pain.  No dysuria, oliguria or hematuria or flank pain.  No bleeding diathesis.   ED Course: When the patient came to the ER, BP was 148/92, heart rate 109 and temperature 97.4 with otherwise normal vital signs.  Labs revealed calcium of 8.7, AST 90 ALT 124 with otherwise unremarkable CMP.  CK was 6203.  CBC was within normal. EKG as reviewed by me : On 3/8 EKG showed sinus tachycardia with a rate of 105. Imaging: Two-view chest x-ray on 3/7 showed no acute cardiopulmonary disease.   The patient was given 1 mg of IV Dilaudid twice and 2 L bolus of IV lactated Ringer.  He will be admitted to a medical  bed for further evaluation and management.   Assessment and Plan:  # Non-traumatic rhabdomyolysis, recurrent S/p aggressive hydration with IV normal saline.and serial CK levels. Continue Pain management. CK  14k--47k--36k.  CK level is significantly high, patient was advised to stay in the hospital for IV hydration but patient has an appointment with the pain clinic tomorrow morning and if he will reschedule then it will be after 4 months so he requested to be discharged today so that he can follow-up with pain clinic tomorrow a.m.  Patient understand the risk and benefit of leaving the hospital.  Patient was advised to return back to the hospital if he is not feeling good.  Follow with PCP to repeat CK level in 1 to 2 days. # Transaminitis, s/p hydration with IV normal saline.  Repeat LFTs after 1 to 2 weeks as an outpatient # Hypokalemia, potassium repleted.  Repeat BMP after 1 to 2 weeks  Body mass index is 20.9 kg/m.  Nutrition Interventions:    - Patient was instructed, not to drive, operate heavy machinery, perform activities at heights, swimming or participation in water activities or provide baby sitting services while on Pain, Sleep and Anxiety Medications; until his outpatient Physician has advised to do so again.  - Also recommended to not to take more than prescribed Pain, Sleep and Anxiety Medications.  Patient was ambulatory without any assistance. On the day of the discharge the patient's vitals were stable, and no other acute medical condition were reported  by patient. the patient was felt safe to be discharge at Home .  Consultants: None Procedures: None  Discharge Exam: General: Appear in no distress, no Rash; Oral Mucosa Clear, moist. Cardiovascular: S1 and S2 Present, no Murmur, Respiratory: normal respiratory effort, Bilateral Air entry present and no Crackles, no wheezes Abdomen: Bowel Sound present, Soft and no tenderness, no hernia Extremities: no Pedal edema, no  calf tenderness Neurology: alert and oriented to time, place, and person affect appropriate.  Filed Weights   05/01/22 0106 05/01/22 0524  Weight: 65.8 kg 64.2 kg   Vitals:   05/02/22 0354 05/02/22 0805  BP: 112/89 102/66  Pulse: 77 90  Resp: 16 17  Temp: 97.7 F (36.5 C) 98.3 F (36.8 C)  SpO2: 97% 100%    DISCHARGE MEDICATION: Allergies as of 05/02/2022       Reactions   Morphine Rash        Medication List     TAKE these medications    acetaminophen 325 MG tablet Commonly known as: TYLENOL Take 2 tablets (650 mg total) by mouth every 8 (eight) hours as needed for mild pain (or Fever >/= 101).   ibuprofen 400 MG tablet Commonly known as: ADVIL Take 1 tablet (400 mg total) by mouth every 8 (eight) hours as needed.   magnesium chloride 64 MG Tbec SR tablet Commonly known as: SLOW-MAG Take 1 tablet (64 mg total) by mouth 2 (two) times daily for 5 days.   Oxycodone HCl 10 MG Tabs Take 1 tablet (10 mg total) by mouth every 6 (six) hours as needed for up to 5 days (Pain).   pantoprazole 40 MG tablet Commonly known as: PROTONIX Take 1 tablet (40 mg total) by mouth daily. Start taking on: May 03, 2022   potassium chloride 10 MEQ tablet Commonly known as: KLOR-CON M Take 2 tablets (20 mEq total) by mouth 2 (two) times daily for 3 days.       Allergies  Allergen Reactions   Morphine Rash   Discharge Instructions     Call MD for:  difficulty breathing, headache or visual disturbances   Complete by: As directed    Call MD for:  extreme fatigue   Complete by: As directed    Call MD for:  persistant dizziness or light-headedness   Complete by: As directed    Call MD for:  severe uncontrolled pain   Complete by: As directed    Call MD for:  temperature >100.4   Complete by: As directed    Diet - low sodium heart healthy   Complete by: As directed    Discharge instructions   Complete by: As directed    Follow with PCP, repeat CK level in 1 to 2 days,  continue oral hydration and avoid excessive exercise or physical activity.   Increase activity slowly   Complete by: As directed        The results of significant diagnostics from this hospitalization (including imaging, microbiology, ancillary and laboratory) are listed below for reference.    Significant Diagnostic Studies: DG Chest 2 View  Result Date: 04/26/2022 CLINICAL DATA:  History of muscular disorder and pericarditis with chest pain and dizziness EXAM: CHEST - 2 VIEW COMPARISON:  Chest radiograph dated 03/31/2022 FINDINGS: Normal lung volumes. No focal consolidations. No pleural effusion or pneumothorax. The heart size and mediastinal contours are within normal limits. The visualized skeletal structures are unremarkable. IMPRESSION: No active cardiopulmonary disease. Electronically Signed   By: Shawn Route.D.  On: 04/26/2022 17:46    Microbiology: Recent Results (from the past 240 hour(s))  Resp panel by RT-PCR (RSV, Flu A&B, Covid) Anterior Nasal Swab     Status: None   Collection Time: 04/26/22  6:21 PM   Specimen: Anterior Nasal Swab  Result Value Ref Range Status   SARS Coronavirus 2 by RT PCR NEGATIVE NEGATIVE Final    Comment: (NOTE) SARS-CoV-2 target nucleic acids are NOT DETECTED.  The SARS-CoV-2 RNA is generally detectable in upper respiratory specimens during the acute phase of infection. The lowest concentration of SARS-CoV-2 viral copies this assay can detect is 138 copies/mL. A negative result does not preclude SARS-Cov-2 infection and should not be used as the sole basis for treatment or other patient management decisions. A negative result may occur with  improper specimen collection/handling, submission of specimen other than nasopharyngeal swab, presence of viral mutation(s) within the areas targeted by this assay, and inadequate number of viral copies(<138 copies/mL). A negative result must be combined with clinical observations, patient history, and  epidemiological information. The expected result is Negative.  Fact Sheet for Patients:  EntrepreneurPulse.com.au  Fact Sheet for Healthcare Providers:  IncredibleEmployment.be  This test is no t yet approved or cleared by the [place] FDA and  has been authorized for detection and/or diagnosis of SARS-CoV-2 by FDA under an Emergency Use Authorization (EUA). This EUA will remain  in effect (meaning this test can be used) for the duration of the COVID-19 declaration under Section 564(b)(1) of the Act, 21 U.S.C.section 360bbb-3(b)(1), unless the authorization is terminated  or revoked sooner.       Influenza A by PCR NEGATIVE NEGATIVE Final   Influenza B by PCR NEGATIVE NEGATIVE Final    Comment: (NOTE) The Xpert Xpress SARS-CoV-2/FLU/RSV plus assay is intended as an aid in the diagnosis of influenza from Nasopharyngeal swab specimens and should not be used as a sole basis for treatment. Nasal washings and aspirates are unacceptable for Xpert Xpress SARS-CoV-2/FLU/RSV testing.  Fact Sheet for Patients: EntrepreneurPulse.com.au  Fact Sheet for Healthcare Providers: IncredibleEmployment.be  This test is not yet approved or cleared by the [place] FDA and has been authorized for detection and/or diagnosis of SARS-CoV-2 by FDA under an Emergency Use Authorization (EUA). This EUA will remain in effect (meaning this test can be used) for the duration of the COVID-19 declaration under Section 564(b)(1) of the Act, 21 U.S.C. section 360bbb-3(b)(1), unless the authorization is terminated or revoked.     Resp Syncytial Virus by PCR NEGATIVE NEGATIVE Final    Comment: (NOTE) Fact Sheet for Patients: EntrepreneurPulse.com.au  Fact Sheet for Healthcare Providers: IncredibleEmployment.be  This test is not yet approved or cleared by the [place] FDA and has been  authorized for detection and/or diagnosis of SARS-CoV-2 by FDA under an Emergency Use Authorization (EUA). This EUA will remain in effect (meaning this test can be used) for the duration of the COVID-19 declaration under Section 564(b)(1) of the Act, 21 U.S.C. section 360bbb-3(b)(1), unless the authorization is terminated or revoked.  Performed at [facility], [address] [place],  [address]      Labs: CBC: Recent Labs  Lab 04/26/22 1658 04/27/22 1330 05/01/22 0108 05/01/22 0501 05/02/22 0448  WBC 7.1 5.7 6.6 6.9 6.2  NEUTROABS  --   --  4.7  --   --   HGB 14.8 14.4 14.6 13.3 13.2  HCT 43.0 41.5 41.7 38.8* 38.2*  MCV 89.4 90.4 89.9 90.7 90.5  PLT 256  235 254 145* [id number]   Basic Metabolic Panel: Recent Labs  Lab 04/28/22 0509 04/29/22 0501 05/01/22 0108 05/01/22 0501 05/02/22 0448  NA 137 135 136 135 135  K 3.7 3.4* 3.8 3.6 3.4*  CL 103 101 103 106 103  CO2 '27 25 26 26 25  '$ GLUCOSE 94 97 105* 119* 94  BUN '8 8 13 10 8  '$ CREATININE 0.56* 0.63 0.67 0.60* 0.58*  CALCIUM 8.9 8.8* 8.7* 8.6* 8.4*  MG  --  1.7  --   --  1.9  PHOS  --  4.2  --   --  3.7   Liver Function Tests: Recent Labs  Lab 05/01/22 0108  AST 90*  ALT 124*  ALKPHOS 72  BILITOT 0.8  PROT 8.0  ALBUMIN 4.2   No results for input(s): "LIPASE", "AMYLASE" in the last 168 hours. No results for input(s): "AMMONIA" in the last 168 hours. Cardiac Enzymes: Recent Labs  Lab 04/29/22 0501 05/01/22 0108 05/01/22 0501 05/01/22 1640 05/02/22 0448  CKTOTAL 19,304* 6,203* 14,841* [id number]* 36,251*   BNP (last 3 results) No results for input(s): "BNP" in the last 8760 hours. CBG: Recent Labs  Lab 05/01/22 0836  GLUCAP 95    Time spent: 35 minutes  Signed:  Val Riles  [facility] 05/02/2022 3:34 PM

## 2022-05-02 NOTE — Progress Notes (Signed)
[facility] Progress Note  Patient: Roberto Stanton    [id number]  DOA: 05/01/2022     Date of Service: the patient was seen and examined on 05/02/2022  Chief Complaint  Patient presents with   Generalized Body Aches   Brief hospital course: Roberto Stanton is a 27 y.o. Caucasian male with medical history significant for myocardial disease, recurrent nontraumatic rhabdomyolysis and pericarditis, who presented to the emergency room with acute onset of generalized myalgia for the last few days similar to his previous rhabdomyolysis episodes for which he was admitted several times here.  He denied any fever or chills.  No falls or injuries.  No chest pain or dyspnea or cough.  No nausea or vomiting or abdominal pain.  No dysuria, oliguria or hematuria or flank pain.  No bleeding diathesis.   ED Course: When the patient came to the ER, BP was 148/92, heart rate 109 and temperature 97.4 with otherwise normal vital signs.  Labs revealed calcium of 8.7, AST 90 ALT 124 with otherwise unremarkable CMP.  CK was 6203.  CBC was within normal. EKG as reviewed by me : On 3/8 EKG showed sinus tachycardia with a rate of 105. Imaging: Two-view chest x-ray on 3/7 showed no acute cardiopulmonary disease.   The patient was given 1 mg of IV Dilaudid twice and 2 L bolus of IV lactated Ringer.  He will be admitted to a medical bed for further evaluation and management.   Assessment and Plan:   # Non-traumatic rhabdomyolysis, recurrent continue aggressive hydration with IV normal saline. follow serial CK levels. Continue Pain management  CK  14k--47k--36k   # Transaminitis - We will continue hydration with IV normal saline. - We will follow LFTs with hydration.   # Hypokalemia, potassium repleted. Monitor electrolytes and replete as needed.   Body mass index is 20.9 kg/m.  Interventions:     Diet: Regular diet DVT Prophylaxis: Subcutaneous Lovenox   Advance goals of care discussion:  Full code  Family Communication: family was not present at bedside, at the time of interview.  The pt provided permission to discuss medical plan with the family. Opportunity was given to ask question and all questions were answered satisfactorily.   Disposition:  Pt is from Home, admitted with elevated CK, still has high CK and on IVF, which precludes a safe discharge. Discharge to Home, when  ck trends down below 1000.  Subjective: No significant events overnight, patient's generalized body ache is 7/10, still requiring high-dose oxycodone p.o.  Patient is feeling improvement overall, stated that he has an appointment with the pain clinic tomorrow morning so we will find out if he can reschedule and let me know if he is planning to stay. CK level is still very high he needs to be on IV fluid, unable to discharge until his Roberto Stanton below 1000.  Physical Exam: General: NAD, lying comfortably Appear in no distress, affect appropriate Eyes: PERRLA ENT: Oral Mucosa Clear, moist  Neck: no JVD,  Cardiovascular: S1 and S2 Present, no Murmur,  Respiratory: good respiratory effort, Bilateral Air entry equal and Decreased, no Crackles, no wheezes Abdomen: Bowel Sound present, Soft and no tenderness,  Skin: no rashes Extremities: no Pedal edema, no calf tenderness Neurologic: without any new focal findings Gait not checked due to patient safety concerns  Vitals:   05/01/22 0700 05/01/22 1944 05/02/22 0354 05/02/22 0805  BP: 116/81 116/79 112/89 102/66  Pulse: 82 87 77 90  Resp: 18 16 16  17  Temp: 97.9 F (36.6 C) 98.6 F (37 C) 97.7 F (36.5 C) 98.3 F (36.8 C)  TempSrc: Oral Oral Oral   SpO2:  99% 97% 100%  Weight:      Height:        Intake/Output Summary (Last 24 hours) at 05/02/2022 1316 Last data filed at 05/01/2022 1533 Gross per 24 hour  Intake 1302.92 ml  Output --  Net 1302.92 ml   Filed Weights   05/01/22 0106 05/01/22 0524  Weight: 65.8 kg 64.2 kg    Data  Reviewed: I have personally reviewed and interpreted daily labs, tele strips, imagings as discussed above. I reviewed all nursing notes, pharmacy notes, vitals, pertinent old records I have discussed plan of care as described above with RN and patient/family.  CBC: Recent Labs  Lab 04/26/22 1658 04/27/22 1330 05/01/22 0108 05/01/22 0501 05/02/22 0448  WBC 7.1 5.7 6.6 6.9 6.2  NEUTROABS  --   --  4.7  --   --   HGB 14.8 14.4 14.6 13.3 13.2  HCT 43.0 41.5 41.7 38.8* 38.2*  MCV 89.4 90.4 89.9 90.7 90.5  PLT 256 235 254 145* [id number]   Basic Metabolic Panel: Recent Labs  Lab 04/28/22 0509 04/29/22 0501 05/01/22 0108 05/01/22 0501 05/02/22 0448  NA 137 135 136 135 135  K 3.7 3.4* 3.8 3.6 3.4*  CL 103 101 103 106 103  CO2 '27 25 26 26 25  '$ GLUCOSE 94 97 105* 119* 94  BUN '8 8 13 10 8  '$ CREATININE 0.56* 0.63 0.67 0.60* 0.58*  CALCIUM 8.9 8.8* 8.7* 8.6* 8.4*  MG  --  1.7  --   --  1.9  PHOS  --  4.2  --   --  3.7    Studies: No results found.  Scheduled Meds:  acetaminophen  650 mg Oral TID   enoxaparin (LOVENOX) injection  40 mg Subcutaneous Q24H   ibuprofen  400 mg Oral TID   magnesium chloride  1 tablet Oral BID   pantoprazole  40 mg Oral Daily   Continuous Infusions:  sodium chloride 125 mL/hr at 05/02/22 1227   PRN Meds: acetaminophen **OR** acetaminophen, HYDROmorphone (DILAUDID) injection, magnesium hydroxide, ondansetron **OR** ondansetron (ZOFRAN) IV, oxyCODONE, traZODone  Time spent: 35 minutes  Author: Val Riles. MD [facility] 05/02/2022 1:16 PM  To reach On-call, see care teams to locate the attending and reach out to them via [email or web address]. If 7PM-7AM, please contact night-coverage If you still have difficulty reaching the attending provider, please page the [facility] (Director on Call) for [facility] on amion for assistance.

## 2022-05-07 ENCOUNTER — Emergency Department
Admission: EM | Admit: 2022-05-07 | Discharge: 2022-05-07 | Disposition: A | Discharge status: Home / Self Care | Payer: Medicaid Other | Attending: Emergency Medicine | Admitting: Emergency Medicine

## 2022-05-07 ENCOUNTER — Other Ambulatory Visit: Payer: Self-pay

## 2022-05-07 DIAGNOSIS — M6282 Rhabdomyolysis: Secondary | ICD-10-CM | POA: Insufficient documentation

## 2022-05-07 DIAGNOSIS — M7918 Myalgia, other site: Secondary | ICD-10-CM | POA: Diagnosis present

## 2022-05-07 LAB — URINALYSIS, ROUTINE W REFLEX MICROSCOPIC
Bilirubin Urine: NEGATIVE
Glucose, UA: NEGATIVE mg/dL
Hgb urine dipstick: NEGATIVE
Ketones, ur: NEGATIVE mg/dL
Leukocytes,Ua: NEGATIVE
Nitrite: NEGATIVE
Protein, ur: NEGATIVE mg/dL
Specific Gravity, Urine: 1.004 — ABNORMAL LOW (ref 1.005–1.030)
pH: 6 (ref 5.0–8.0)

## 2022-05-07 LAB — BASIC METABOLIC PANEL
Anion gap: 11 (ref 5–15)
BUN: 13 mg/dL (ref 6–20)
CO2: 28 mmol/L (ref 22–32)
Calcium: 9.5 mg/dL (ref 8.9–10.3)
Chloride: 97 mmol/L — ABNORMAL LOW (ref 98–111)
Creatinine, Ser: 0.73 mg/dL (ref 0.61–1.24)
GFR, Estimated: >60 mL/min (ref >60)
Glucose, Bld: 117 mg/dL — ABNORMAL HIGH (ref 70–99)
Potassium: 3.4 mmol/L — ABNORMAL LOW (ref 3.5–5.1)
Sodium: 136 mmol/L (ref 135–145)

## 2022-05-07 LAB — CBC
HCT: 44.4 % (ref 39.0–52.0)
Hemoglobin: 15 g/dL (ref 13.0–17.0)
MCH: 30.9 pg (ref 26.0–34.0)
MCHC: 33.8 g/dL (ref 30.0–36.0)
MCV: 91.4 fL (ref 80.0–100.0)
Platelets: 255 10*3/uL (ref 150–400)
RBC: 4.86 MIL/uL (ref 4.22–5.81)
RDW: 12.6 % (ref 11.5–15.5)
WBC: 6.1 10*3/uL (ref 4.0–10.5)
nRBC: 0 % (ref 0.0–0.2)

## 2022-05-07 LAB — CK: Total CK: 6973 U/L — ABNORMAL HIGH (ref 49–397)

## 2022-05-07 MED ORDER — OXYCODONE HCL 10 MG PO TABS: 5.0000 mg | ORAL_TABLET | Freq: Four times a day (QID) | ORAL | 0 refills | Status: DC | PRN

## 2022-05-07 MED ORDER — SODIUM CHLORIDE 0.9 % IV BOLUS
1000.0000 mL | Freq: Once | INTRAVENOUS | Status: AC
  Administered 2022-05-07: 1000 mL via INTRAVENOUS

## 2022-05-07 MED ORDER — OXYCODONE HCL 5 MG PO TABS
5.0000 mg | ORAL_TABLET | Freq: Once | ORAL | Status: AC
  Administered 2022-05-07: 5 mg via ORAL
  Filled 2022-05-07: qty 1

## 2022-05-07 MED ORDER — HYDROMORPHONE HCL 1 MG/ML IJ SOLN
1.0000 mg | Freq: Once | INTRAMUSCULAR | Status: AC
  Administered 2022-05-07: 1 mg via INTRAVENOUS
  Filled 2022-05-07: qty 1

## 2022-05-07 NOTE — ED Triage Notes (Signed)
Pt to ED for possible elevated CK, states left AMA yesterday after being admitted for same while out of town. States dark color urine.  Ambulator NAD

## 2022-05-07 NOTE — ED Notes (Addendum)
Primary RN discharged pt to lobby. Pt was given IV dilaudid at 1001am prior to discharge and told primary RN that the would call for ride upon discharge. Pt was seen leaving ER lobby by this RN and getting into his personal vehicle ([employer]). RN unable to get license plate. [employer] officer in [place] made aware to notify dispatch

## 2022-05-07 NOTE — Discharge Instructions (Addendum)
Call your primary care provider and make an appointment if you continue to need pain medication.  Keep your appointment with the specialist in [place] as you are primary care has referred you to.  Continue to drink lots of fluids to stay hydrated.  Return to the emergency department if any severe worsening of your symptoms.

## 2022-05-07 NOTE — ED Provider Notes (Signed)
[facility] Provider Note    Event Date/Time   First MD Initiated Contact with Patient 05/07/22 0745     (approximate)   History   Muscle Pain   HPI  Roberto Stanton is a 27 y.o. male   presents to the ED with complaint of muscle aches and possible elevated CK.  Patient has a history of recurrent nontraumatic rhabdomyolysis and has been seen in the emergency department multiple times for the same.  Patient [place] and was seen at a hospital somewhere near [place] where he was admitted for 2 days and began feeling better.  This morning he woke with muscle pain.  Patient has history of McArdle Disease.      Physical Exam   Triage Vital Signs: ED Triage Vitals  Enc Vitals Group     BP 05/07/22 0739 133/86     Pulse Rate 05/07/22 0741 89     Resp 05/07/22 0739 16     Temp 05/07/22 0741 97.8 F (36.6 C)     Temp src --      SpO2 05/07/22 0742 98 %     Weight 05/07/22 0739 140 lb (63.5 kg)     Height 05/07/22 0739 5\' 9"  (1.753 m)     Head Circumference --      Peak Flow --      Pain Score 05/07/22 0739 8     Pain Loc --      Pain Edu? --      Excl. in [place]? --     Most recent vital signs: Vitals:   05/07/22 0741 05/07/22 0742  BP:    Pulse: 89   Resp:    Temp: 97.8 F (36.6 C)   SpO2:  98%     General: Awake, no distress.  CV:  Good peripheral perfusion.  Heart regular rate and rhythm. Resp:  Normal effort.  Lungs are clear bilaterally. Abd:  No distention.  Other:  No gross deformity with looking at the upper and lower extremities.  Patient is able to stand and ambulate without any assistance and is noted to do so several times while in the emergency department.   ED Results / Procedures / Treatments   Labs (all labs ordered are listed, but only abnormal results are displayed) Labs Reviewed  BASIC METABOLIC PANEL - Abnormal; Notable for the following components:      Result Value   Potassium 3.4 (*)     Chloride 97 (*)    Glucose, Bld 117 (*)    All other components within normal limits  URINALYSIS, ROUTINE W REFLEX MICROSCOPIC - Abnormal; Notable for the following components:   Color, Urine COLORLESS (*)    APPearance CLEAR (*)    Specific Gravity, Urine 1.004 (*)    All other components within normal limits  CK - Abnormal; Notable for the following components:   Total CK 6,973 (*)    All other components within normal limits  CBC      PROCEDURES:  Critical Care performed:   Procedures   MEDICATIONS ORDERED IN ED: Medications  sodium chloride 0.9 % bolus 1,000 mL (0 mLs Intravenous Stopped 05/07/22 1000)  oxyCODONE (Oxy IR/ROXICODONE) immediate release tablet 5 mg (5 mg Oral Given 05/07/22 0758)  HYDROmorphone (DILAUDID) injection 1 mg (1 mg Intravenous Given 05/07/22 1001)     IMPRESSION / MDM / ASSESSMENT AND PLAN / ED COURSE  I reviewed the triage vital signs and  the nursing notes.   Differential diagnosis includes, but is not limited to, atraumatic rhabdomyolysis, chronic.  27 year old male presents to the ED for recheck of his labs.  He was seen and hospitalized at [facility] in [facility] for 2 days and then left [facility] yesterday.  He was hospitalized for rhabdomyolysis which he has chronically and is waiting to be seen by specialist in [place] in 2 weeks.  Lab work showed a CK of over 7800 yesterday.  Patient has had an previous ED visits as high as 47,000, 36,000.  Patient states he generally goes home based on how he is feeling.  CK today is 6973 and patient was made aware.  He states this is definitely an improvement and he feels that he can go home with pain medication.  He was given a liter of fluids while in the emergency department and he is to continue drinking fluids at home.  He is aware that if there is any worsening of his symptoms he is to return to the emergency department but he is well versed in his chronic issues.  Has prescription for  oxycodone 10 mg 1 every 6 hours as needed for severe pain was sent to his pharmacy.      Patient's presentation is most consistent with acute illness / injury with system symptoms.  FINAL CLINICAL IMPRESSION(S) / ED DIAGNOSES   Final diagnoses:  Non-traumatic rhabdomyolysis     Rx / DC Orders   ED Discharge Orders          Ordered    oxyCODONE 10 MG TABS  Every 6 hours PRN        05/07/22 1013             Note:  This document was prepared using Dragon voice recognition software and may include unintentional dictation errors.   Johnn Hai, PA-C 05/07/22 1624    Lavonia Drafts, MD 05/07/22 [phone]

## 2022-05-14 ENCOUNTER — Other Ambulatory Visit: Payer: Self-pay

## 2022-05-14 ENCOUNTER — Inpatient Hospital Stay
Admission: EM | Admit: 2022-05-14 | Discharge: 2022-05-16 | DRG: 558 | Disposition: A | Discharge status: Home / Self Care | Payer: Medicaid Other | Attending: Hospitalist | Admitting: Hospitalist

## 2022-05-14 ENCOUNTER — Encounter: Payer: Self-pay | Admitting: Internal Medicine

## 2022-05-14 DIAGNOSIS — Z79891 Long term (current) use of opiate analgesic: Secondary | ICD-10-CM

## 2022-05-14 DIAGNOSIS — Z8249 Family history of ischemic heart disease and other diseases of the circulatory system: Secondary | ICD-10-CM

## 2022-05-14 DIAGNOSIS — Z885 Allergy status to narcotic agent status: Secondary | ICD-10-CM

## 2022-05-14 DIAGNOSIS — E7404 McArdle disease: Secondary | ICD-10-CM | POA: Diagnosis present

## 2022-05-14 DIAGNOSIS — M6282 Rhabdomyolysis: Secondary | ICD-10-CM | POA: Diagnosis not present

## 2022-05-14 DIAGNOSIS — E860 Dehydration: Secondary | ICD-10-CM | POA: Diagnosis present

## 2022-05-14 DIAGNOSIS — Z87442 Personal history of urinary calculi: Secondary | ICD-10-CM

## 2022-05-14 DIAGNOSIS — Z87891 Personal history of nicotine dependence: Secondary | ICD-10-CM

## 2022-05-14 DIAGNOSIS — G8929 Other chronic pain: Secondary | ICD-10-CM | POA: Diagnosis present

## 2022-05-14 LAB — COMPREHENSIVE METABOLIC PANEL
ALT: 222 U/L — ABNORMAL HIGH (ref 0–44)
AST: 277 U/L — ABNORMAL HIGH (ref 15–41)
Albumin: 3.8 g/dL (ref 3.5–5.0)
Alkaline Phosphatase: 77 U/L (ref 38–126)
Anion gap: 6 (ref 5–15)
BUN: 11 mg/dL (ref 6–20)
CO2: 26 mmol/L (ref 22–32)
Calcium: 8.8 mg/dL — ABNORMAL LOW (ref 8.9–10.3)
Chloride: 102 mmol/L (ref 98–111)
Creatinine, Ser: 0.61 mg/dL (ref 0.61–1.24)
GFR, Estimated: >60 mL/min (ref >60)
Glucose, Bld: 95 mg/dL (ref 70–99)
Potassium: 3.7 mmol/L (ref 3.5–5.1)
Sodium: 134 mmol/L — ABNORMAL LOW (ref 135–145)
Total Bilirubin: 0.8 mg/dL (ref 0.3–1.2)
Total Protein: 7.7 g/dL (ref 6.5–8.1)

## 2022-05-14 LAB — CBC
HCT: 42 % (ref 39.0–52.0)
Hemoglobin: 14.2 g/dL (ref 13.0–17.0)
MCH: 31 pg (ref 26.0–34.0)
MCHC: 33.8 g/dL (ref 30.0–36.0)
MCV: 91.7 fL (ref 80.0–100.0)
Platelets: 252 10*3/uL (ref 150–400)
RBC: 4.58 MIL/uL (ref 4.22–5.81)
RDW: 12.8 % (ref 11.5–15.5)
WBC: 6.4 10*3/uL (ref 4.0–10.5)
nRBC: 0 % (ref 0.0–0.2)

## 2022-05-14 LAB — CK: Total CK: 36335 U/L — ABNORMAL HIGH (ref 49–397)

## 2022-05-14 MED ORDER — LACTATED RINGERS IV SOLN: INTRAVENOUS | Status: AC

## 2022-05-14 MED ORDER — ONDANSETRON HCL 4 MG/2ML IJ SOLN: 4.0000 mg | Freq: Four times a day (QID) | INTRAMUSCULAR | Status: DC | PRN

## 2022-05-14 MED ORDER — ADULT MULTIVITAMIN W/MINERALS CH
1.0000 | ORAL_TABLET | Freq: Every day | ORAL | Status: DC
  Filled 2022-05-14 (×2): qty 1

## 2022-05-14 MED ORDER — HYDROMORPHONE HCL 1 MG/ML IJ SOLN
1.0000 mg | INTRAMUSCULAR | Status: DC | PRN
  Administered 2022-05-14 – 2022-05-16 (×8): 1 mg via INTRAVENOUS
  Filled 2022-05-14 (×9): qty 1

## 2022-05-14 MED ORDER — OXYCODONE HCL 5 MG PO TABS
10.0000 mg | ORAL_TABLET | Freq: Once | ORAL | Status: AC
  Administered 2022-05-14: 10 mg via ORAL
  Filled 2022-05-14: qty 2

## 2022-05-14 MED ORDER — ACETAMINOPHEN 650 MG RE SUPP: 650.0000 mg | Freq: Four times a day (QID) | RECTAL | Status: DC | PRN

## 2022-05-14 MED ORDER — SODIUM CHLORIDE 0.9 % IV BOLUS
1000.0000 mL | Freq: Once | INTRAVENOUS | Status: AC
  Administered 2022-05-14: 1000 mL via INTRAVENOUS

## 2022-05-14 MED ORDER — ONDANSETRON HCL 4 MG PO TABS: 4.0000 mg | ORAL_TABLET | Freq: Four times a day (QID) | ORAL | Status: DC | PRN

## 2022-05-14 MED ORDER — SODIUM CHLORIDE 0.9% FLUSH
3.0000 mL | Freq: Two times a day (BID) | INTRAVENOUS | Status: DC
  Administered 2022-05-14 – 2022-05-15 (×2): 3 mL via INTRAVENOUS

## 2022-05-14 MED ORDER — ENOXAPARIN SODIUM 40 MG/0.4ML IJ SOSY
40.0000 mg | PREFILLED_SYRINGE | INTRAMUSCULAR | Status: DC
  Filled 2022-05-14 (×2): qty 0.4

## 2022-05-14 MED ORDER — OXYCODONE-ACETAMINOPHEN 5-325 MG PO TABS
1.0000 | ORAL_TABLET | Freq: Four times a day (QID) | ORAL | Status: DC | PRN
  Filled 2022-05-14: qty 2

## 2022-05-14 MED ORDER — OXYCODONE HCL 5 MG PO TABS
5.0000 mg | ORAL_TABLET | Freq: Four times a day (QID) | ORAL | Status: AC | PRN
  Administered 2022-05-15 (×2): 10 mg via ORAL
  Filled 2022-05-14 (×3): qty 2

## 2022-05-14 MED ORDER — ACETAMINOPHEN 325 MG PO TABS: 650.0000 mg | ORAL_TABLET | Freq: Four times a day (QID) | ORAL | Status: DC | PRN

## 2022-05-14 NOTE — ED Provider Notes (Signed)
[facility] Provider Note  Patient Contact: 5:19 PM (approximate)   History   Dehydration   HPI  Roberto Stanton is a 27 y.o. male with a history of pericarditis, rhabdomyolysis and McArdle disease, presents to the emergency department with generalized bodyaches.  Patient reports that he has tried to resume working out in the gym for the past 2 days and has been aggressively drinking fluids.  He states that he woke up today and feels generalized bodyaches that he associates with rhabdo.  He denies dark appearing urine.  No falls or other mechanisms of trauma.  He denies chest pain, chest tightness or shortness of breath.      Physical Exam   Triage Vital Signs: ED Triage Vitals  Enc Vitals Group     BP 05/14/22 1625 133/80     Pulse Rate 05/14/22 1625 84     Resp 05/14/22 1625 17     Temp 05/14/22 1625 98 F (36.7 C)     Temp Source 05/14/22 1625 Oral     SpO2 05/14/22 1625 99 %     Weight 05/14/22 1626 140 lb (63.5 kg)     Height --      Head Circumference --      Peak Flow --      Pain Score 05/14/22 1626 6     Pain Loc --      Pain Edu? --      Excl. in [place]? --     Most recent vital signs: Vitals:   05/14/22 1730 05/14/22 1800  BP: 121/86 116/75  Pulse: 84 84  Resp:    Temp:    SpO2: 99% 98%     General: Alert and in no acute distress. Eyes:  PERRL. EOMI. Head: No acute traumatic findings ENT:      Nose: No congestion/rhinnorhea.      Mouth/Throat: Mucous membranes are moist. Neck: No stridor. No cervical spine tenderness to palpation. Cardiovascular:  Good peripheral perfusion Respiratory: Normal respiratory effort without tachypnea or retractions. Lungs CTAB. Good air entry to the bases with no decreased or absent breath sounds. Gastrointestinal: Bowel sounds 4 quadrants. Soft and nontender to palpation. No guarding or rigidity. No palpable masses. No distention. No CVA tenderness. Musculoskeletal: Full range of motion to all  extremities.  Neurologic:  No gross focal neurologic deficits are appreciated.  Skin:   No rash noted    ED Results / Procedures / Treatments   Labs (all labs ordered are listed, but only abnormal results are displayed) Labs Reviewed  COMPREHENSIVE METABOLIC PANEL - Abnormal; Notable for the following components:      Result Value   Sodium 134 (*)    Calcium 8.8 (*)    AST 277 (*)    ALT 222 (*)    All other components within normal limits  CK - Abnormal; Notable for the following components:   Total CK 36,335 (*)    All other components within normal limits  CBC  COMPREHENSIVE METABOLIC PANEL  LIPASE, BLOOD  URINALYSIS, ROUTINE W REFLEX MICROSCOPIC       PROCEDURES:  Critical Care performed: No  Procedures   MEDICATIONS ORDERED IN ED: Medications  sodium chloride flush (NS) 0.9 % injection 3 mL (has no administration in time range)  lactated ringers infusion (has no administration in time range)  enoxaparin (LOVENOX) injection 40 mg (has no administration in time range)  acetaminophen (TYLENOL) tablet 650 mg (has no administration in time range)  Or  acetaminophen (TYLENOL) suppository 650 mg (has no administration in time range)  ondansetron (ZOFRAN) tablet 4 mg (has no administration in time range)    Or  ondansetron (ZOFRAN) injection 4 mg (has no administration in time range)  multivitamin with minerals tablet 1 tablet (has no administration in time range)  oxyCODONE-acetaminophen (PERCOCET/ROXICET) 5-325 MG per tablet 1-2 tablet (has no administration in time range)  HYDROmorphone (DILAUDID) injection 1 mg (has no administration in time range)  sodium chloride 0.9 % bolus 1,000 mL (0 mLs Intravenous Stopped 05/14/22 1805)  sodium chloride 0.9 % bolus 1,000 mL (0 mLs Intravenous Stopped 05/14/22 1900)  oxyCODONE (Oxy IR/ROXICODONE) immediate release tablet 10 mg (10 mg Oral Given 05/14/22 1829)     IMPRESSION / MDM / ASSESSMENT AND PLAN / ED COURSE  I  reviewed the triage vital signs and the nursing notes.                              Assessment and plan:  Rhabdo 27 year old male presents to the emergency department with concern for rhabdo given his history of McArdle's disease.  Vital signs were reassuring at triage.  On exam, patient was alert, active and nontoxic-appearing.    CK was elevated at 36,335.  Patient CMP indicated elevated AST and ALT at 277 and 222 respectively which is elevated when compared to patient's baseline.   Patient received 2 L of normal saline while in the emergency department.  Patient was accepted for admission to the hospitalist service under the care of of Dr. Charleen Kirks.  FINAL CLINICAL IMPRESSION(S) / ED DIAGNOSES   Final diagnoses:  Non-traumatic rhabdomyolysis     Rx / DC Orders   ED Discharge Orders     None        Note:  This document was prepared using Dragon voice recognition software and may include unintentional dictation errors.   Roberto Stanton, [place] 05/14/22 Karl Bales    Lavonia Drafts, MD 05/14/22 2015

## 2022-05-14 NOTE — Progress Notes (Incomplete)
       CROSS COVER NOTE  NAME: Roberto Stanton MRN: [id number] DOB : 05/06/1995 ATTENDING PHYSICIAN: Jose Persia, MD    Date of Service   05/14/2022   HPI/Events of Note   Report ***Pt just arrived to unit from ED. H&P in from Previous Provider. Pt in for Rhabdo related to McArdles disease. Pt has percocet ordered and dilaudid for pain. Went to administer percocet. Pt refused. States the tylenol in it hurts his stomach. He is requesting the percocet be changed to Oxy IR  On Review of chart *** Bedside eval*** HPI***  Interventions   Assessment/Plan: X X X    *** professional thanks      To reach the provider On-Call:   7AM- 7PM see care teams to locate the attending and reach out to them via [email or web address] Password: [email or web address] 7PM-7AM contact night-coverage If you still have difficulty reaching the appropriate provider, please page the [facility] (Director on Call) for [facility] on amion for assistance  This document was prepared using Systems analyst and may include unintentional dictation errors.  Neomia Glass DNP, MBA, FNP-BC, PMHNP-BC Nurse Practitioner [facility] Pager [phone]

## 2022-05-14 NOTE — Assessment & Plan Note (Signed)
-   Recommend continued outpatient follow-up with neurology

## 2022-05-14 NOTE — Progress Notes (Signed)
       CROSS COVER NOTE  NAME: Roberto Stanton MRN: [id number] DOB : November 03, 1995 ATTENDING PHYSICIAN: Jose Persia, MD    Date of Service   05/14/2022   HPI/Events of Note   Report/Request  "Pt just arrived to unit from ED. H&P in from Previous Provider. Pt in for Rhabdo related to McArdles disease. Pt has percocet ordered and dilaudid for pain. Went to administer percocet. Pt refused. States the tylenol in it hurts his stomach. He is requesting the percocet be changed to Oxy IR"  Interventions   Assessment/Plan:  PRN Oxycodone      To reach the provider On-Call:   7AM- 7PM see care teams to locate the attending and reach out to them via [email or web address] Password: [email or web address] 7PM-7AM contact night-coverage If you still have difficulty reaching the appropriate provider, please page the [facility] (Director on Call) for [facility] on amion for assistance  This document was prepared using Systems analyst and may include unintentional dictation errors.  Neomia Glass DNP, MBA, FNP-BC, PMHNP-BC Nurse Practitioner [facility] Pager [phone]

## 2022-05-14 NOTE — ED Notes (Signed)
Pt reports has rare condition which causes him to go into rhabdo more easily/often; states sometimes working out isn't a problem and other times it causes rhabdo almost immediately; reports has been drinking lots of Gatorade recently; confirms has not been drinking much water; is concerned about his "CK" levels; is resting calmly on stretcher; skin dry; resp reg/unlabored.

## 2022-05-14 NOTE — Assessment & Plan Note (Addendum)
Patient presenting with evidence of acute rhabdomyolysis in the setting of exercise. 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  He follows with [facility] neurology with last visit in September 2023.  No evidence of renal failure.  AST and ALT are elevated, most consistent with muscle injury, not hepatic.   - IV fluid resuscitation - Repeat CMP in the a.m. - Percocet and Dilaudid for pain control - Encouraged Mr. Setterlund to reach out to support groups and continue following with neurology

## 2022-05-14 NOTE — ED Triage Notes (Signed)
Pt comes via POV. Pt sts that he has Mcardle disease and has been trying to drink a lot of fluids and work out to help him but pt sts that he has been feeling bad for the last two days.

## 2022-05-14 NOTE — H&P (Signed)
History and Physical    Patient: Roberto Stanton DOB: 10/18/1995 DOA: 05/14/2022 DOS: the patient was seen and examined on 05/14/2022 PCP: Rutherford Limerick, PA  Patient coming from: Home  Chief Complaint:  Chief Complaint  Patient presents with   Dehydration   HPI: Roberto Stanton is a 27 y.o. 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  Roberto Stanton states that he recently joined a gym and has been working out for the past 3 days.  He has been doing mostly weight lifting.  He has been cautious in using lower weight lifts and keeping up with fluid intake, however he subsequently developed diffuse muscle cramping that are worse in his back.  He states that this is his typical symptoms when he has rhabdomyolysis.  He had started working out as it was recommended to him by his physician.  ED course: On arrival to the ED, patient was normotensive at 133/80 with heart rate of 82.  He was saturating at 96% on room air.  He was afebrile at 98. Initial workup notable for CK levels of 36,335, AST 277, ALT 222, potassium 3.7, calcium 8.8, and GFR above 60.  CBC with no abnormalities.  Patient started on IV fluids and [facility] contacted for admission.  Review of Systems: As mentioned in the history of present illness. All other systems reviewed and are negative.  Past Medical History:  Diagnosis Date   Kidney stones    McArdle disease ([place]) 09/28/2021   Pericarditis    Rhabdomyolysis    No past surgical history on file. Social History:  reports that he has quit smoking. He has never used smokeless tobacco. He reports that he does not currently use alcohol. He reports that he does not currently use drugs after having used the following drugs: Marijuana.  Allergies  Allergen Reactions   Morphine Rash    Family History  Problem Relation Age of Onset   Heart disease Father     Prior to  Admission medications   Medication Sig Start Date End Date Taking? Authorizing Provider  acetaminophen (TYLENOL) 325 MG tablet Take 2 tablets (650 mg total) by mouth every 8 (eight) hours as needed for mild pain (or Fever >/= 101). 05/02/22   Val Riles, MD  ibuprofen (ADVIL) 400 MG tablet Take 1 tablet (400 mg total) by mouth every 8 (eight) hours as needed. 05/02/22   Val Riles, MD  oxyCODONE 10 MG TABS Take 0.5 tablets (5 mg total) by mouth every 6 (six) hours as needed for severe pain. 05/07/22   Johnn Hai, PA-C  pantoprazole (PROTONIX) 40 MG tablet Take 1 tablet (40 mg total) by mouth daily. 05/03/22 06/02/22  Val Riles, MD  potassium chloride (KLOR-CON M) 10 MEQ tablet Take 2 tablets (20 mEq total) by mouth 2 (two) times daily for 3 days. 04/29/22 05/02/22  Sharen Hones, MD    Physical Exam: Vitals:   05/14/22 1701 05/14/22 1701 05/14/22 1730 05/14/22 1800  BP:  123/68 121/86 116/75  Pulse: 77  84 84  Resp:      Temp:      TempSrc:      SpO2: 97%  99% 98%  Weight:       Physical Exam Vitals and nursing note reviewed.  Constitutional:      General: He is not in acute distress.    Appearance: He is normal weight. He is not toxic-appearing.  HENT:     Head: Normocephalic and atraumatic.     Mouth/Throat:     Mouth: Mucous membranes are moist.     Pharynx: Oropharynx is clear.  Eyes:     Conjunctiva/sclera: Conjunctivae normal.     Pupils: Pupils are equal, round, and reactive to light.  Cardiovascular:     Rate and Rhythm: Normal rate and regular rhythm.     Heart sounds: No murmur heard. Pulmonary:     Effort: Pulmonary effort is normal. No respiratory distress.  Musculoskeletal:     Right lower leg: No edema.     Left lower leg: No edema.  Skin:    General: Skin is warm and dry.  Neurological:     General: No focal deficit present.     Mental Status: He is alert and oriented to person, place, and time. Mental status is at baseline.  Psychiatric:         Mood and Affect: Mood normal.        Behavior: Behavior normal.    Data Reviewed: CBC with WBC of 6.4, hemoglobin of 14.2, platelets of 252 CMP with sodium of 134, potassium 3.7, bicarb 26, BUN 11, creatinine 0.61, calcium 8.8, alkaline phosphatase 77, AST 277, ALT 222, GFR above 60 CK elevated at 36,335  There are no new results to review at this time.  Assessment and Plan:  * Rhabdomyolysis Patient presenting with evidence of acute rhabdomyolysis in the setting of exercise. 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  He follows with [facility] neurology with last visit in September 2023.  No evidence of renal failure.  AST and ALT are elevated, most consistent with muscle injury, not hepatic.   - IV fluid resuscitation - Repeat CMP in the a.m. - Percocet and Dilaudid for pain control - Encouraged Roberto Stanton to reach out to support groups and continue following with neurology  Myophosphorylase deficiency (glycogen storage disease V, McArdle disease) ([place]) - Recommend continued outpatient follow-up with neurology  Advance Care Planning:   Code Status: Full Code   Consults: None  Family Communication: No family at bedside  Severity of Illness: The appropriate patient status for this patient is OBSERVATION. Observation status is judged to be reasonable and necessary in order to provide the required intensity of service to ensure the patient's safety. The patient's presenting symptoms, physical exam findings, and initial radiographic and laboratory data in the context of their medical condition is felt to place them at decreased risk for further clinical deterioration. Furthermore, it is anticipated that the patient will be medically stable for discharge from the hospital within 2 midnights of admission.   Author: Jose Persia, MD 05/14/2022 7:44 PM  For on call review [email or web address].

## 2022-05-15 DIAGNOSIS — M6282 Rhabdomyolysis: Secondary | ICD-10-CM | POA: Diagnosis present

## 2022-05-15 DIAGNOSIS — Z87442 Personal history of urinary calculi: Secondary | ICD-10-CM | POA: Diagnosis not present

## 2022-05-15 DIAGNOSIS — E7404 McArdle disease: Secondary | ICD-10-CM | POA: Diagnosis present

## 2022-05-15 DIAGNOSIS — R7401 Elevation of levels of liver transaminase levels: Secondary | ICD-10-CM

## 2022-05-15 DIAGNOSIS — Z79891 Long term (current) use of opiate analgesic: Secondary | ICD-10-CM | POA: Diagnosis not present

## 2022-05-15 DIAGNOSIS — Z87891 Personal history of nicotine dependence: Secondary | ICD-10-CM | POA: Diagnosis not present

## 2022-05-15 DIAGNOSIS — Z885 Allergy status to narcotic agent status: Secondary | ICD-10-CM | POA: Diagnosis not present

## 2022-05-15 DIAGNOSIS — Z8249 Family history of ischemic heart disease and other diseases of the circulatory system: Secondary | ICD-10-CM | POA: Diagnosis not present

## 2022-05-15 DIAGNOSIS — E860 Dehydration: Secondary | ICD-10-CM | POA: Diagnosis present

## 2022-05-15 DIAGNOSIS — G8929 Other chronic pain: Secondary | ICD-10-CM | POA: Diagnosis present

## 2022-05-15 LAB — COMPREHENSIVE METABOLIC PANEL
ALT: 197 U/L — ABNORMAL HIGH (ref 0–44)
AST: 206 U/L — ABNORMAL HIGH (ref 15–41)
Albumin: 3.8 g/dL (ref 3.5–5.0)
Alkaline Phosphatase: 67 U/L (ref 38–126)
Anion gap: 3 — ABNORMAL LOW (ref 5–15)
BUN: 9 mg/dL (ref 6–20)
CO2: 28 mmol/L (ref 22–32)
Calcium: 8.7 mg/dL — ABNORMAL LOW (ref 8.9–10.3)
Chloride: 104 mmol/L (ref 98–111)
Creatinine, Ser: 0.55 mg/dL — ABNORMAL LOW (ref 0.61–1.24)
GFR, Estimated: >60 mL/min (ref >60)
Glucose, Bld: 90 mg/dL (ref 70–99)
Potassium: 3.5 mmol/L (ref 3.5–5.1)
Sodium: 135 mmol/L (ref 135–145)
Total Bilirubin: 0.6 mg/dL (ref 0.3–1.2)
Total Protein: 7.3 g/dL (ref 6.5–8.1)

## 2022-05-15 LAB — CK: Total CK: 23921 U/L — ABNORMAL HIGH (ref 49–397)

## 2022-05-15 MED ORDER — OXYCODONE HCL 5 MG PO TABS
10.0000 mg | ORAL_TABLET | Freq: Four times a day (QID) | ORAL | Status: DC | PRN
  Administered 2022-05-15 – 2022-05-16 (×4): 10 mg via ORAL
  Filled 2022-05-15 (×4): qty 2

## 2022-05-15 MED ORDER — LACTATED RINGERS IV SOLN: INTRAVENOUS | Status: DC

## 2022-05-15 NOTE — Plan of Care (Signed)
Pt alert and oriented x 4. Pt has received 2 doses of dilaudid and 1 dose of oxycodone for general muscle pain. Medication regimen effective and patient has been able to rest. Pt up adlib to bathroom. Vitals stable. On call provider notified during overnight due to pt refused percocet. Percocet upsets stomach and wants only oxy. Oxy was ordered.  Problem: Education: Goal: Knowledge of General Education information will improve Description: Including pain rating scale, medication(s)/side effects and non-pharmacologic comfort measures Outcome: Progressing   Problem: Health Behavior/Discharge Planning: Goal: Ability to manage health-related needs will improve Outcome: Progressing   Problem: Clinical Measurements: Goal: Ability to maintain clinical measurements within normal limits will improve Outcome: Progressing Goal: Will remain free from infection Outcome: Progressing Goal: Diagnostic test results will improve Outcome: Progressing Goal: Respiratory complications will improve Outcome: Progressing Goal: Cardiovascular complication will be avoided Outcome: Progressing   Problem: Activity: Goal: Risk for activity intolerance will decrease Outcome: Progressing   Problem: Nutrition: Goal: Adequate nutrition will be maintained Outcome: Progressing   Problem: Coping: Goal: Level of anxiety will decrease Outcome: Progressing   Problem: Elimination: Goal: Will not experience complications related to bowel motility Outcome: Progressing Goal: Will not experience complications related to urinary retention Outcome: Progressing   Problem: Pain Managment: Goal: General experience of comfort will improve Outcome: Progressing   Problem: Safety: Goal: Ability to remain free from injury will improve Outcome: Progressing   Problem: Skin Integrity: Goal: Risk for impaired skin integrity will decrease Outcome: Progressing

## 2022-05-15 NOTE — Progress Notes (Signed)
PROGRESS NOTE   HPI was taken from Dr. Charleen Kirks:  Roberto Stanton is a 27 y.o. 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   [name] states that he recently joined a gym and has been working out for the past 3 days.  He has been doing mostly weight lifting.  He has been cautious in using lower weight lifts and keeping up with fluid intake, however he subsequently developed diffuse muscle cramping that are worse in his back.  He states that this is his typical symptoms when he has rhabdomyolysis.  He had started working out as it was recommended to him by his physician.   ED course: On arrival to the ED, patient was normotensive at 133/80 with heart rate of 82.  He was saturating at 96% on room air.  He was afebrile at 98. Initial workup notable for CK levels of 36,335, AST 277, ALT 222, potassium 3.7, calcium 8.8, and GFR above 60.  CBC with no abnormalities.  Patient started on IV fluids and TRH contacted for admission.  ASTIN Roberto Stanton  [id number] DOB: 1996-01-27 DOA: 05/14/2022 PCP: Rutherford Limerick, PA    Assessment & Plan:   Principal Problem:   Rhabdomyolysis Active Problems:   Myophosphorylase deficiency (glycogen storage disease V, McArdle disease) ([place])  Assessment and Plan: Rhabdomyolysis: likely secondary to pt exercising. 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 Follows at [facility] neuro. Continue on IVFs. Dilaudid, oxy prn for pain. CK still significantly elevated but trending down.   Transaminitis: likely secondary to above. Continue to monitor    Myophosphorylase deficiency/glycogen storage disease V, McArdle disease: management per [facility] neurology outpatient       DVT prophylaxis: lovenox Code Status: full  Family Communication:  Disposition Plan: will d/c back home   Level of care: Med-Surg  Status is: Inpatient Remains inpatient appropriate  because: severity of illness    Consultants:    Procedures:   Antimicrobials:    Subjective: Pt c/o muscle pains  Objective: Vitals:   05/14/22 2141 05/14/22 2311 05/15/22 0438 05/15/22 0756  BP: 130/87 128/89 (!) 128/90 111/79  Pulse: 88 82 88 77  Resp: 17 20 20 18   Temp: 97.7 F (36.5 C) 97.8 F (36.6 C) 97.7 F (36.5 C) 97.8 F (36.6 C)  TempSrc: Oral   Oral  SpO2: 98% 99% 100% 100%  Weight:  63.6 kg    Height:  5\' 9"  (1.753 m)      Intake/Output Summary (Last 24 hours) at 05/15/2022 1218 Last data filed at 05/15/2022 0300 Gross per 24 hour  Intake 1792.5 ml  Output --  Net 1792.5 ml   Filed Weights   05/14/22 1626 05/14/22 2311  Weight: 63.5 kg 63.6 kg    Examination:  General exam: Appears calm and comfortable  Respiratory system: Clear to auscultation. Respiratory effort normal. Cardiovascular system: S1 & S2 +. No rubs, gallops or clicks. No pedal edema. Gastrointestinal system: Abdomen is nondistended, soft and nontender. Normal bowel sounds heard. Central nervous system: Alert and oriented. Moves all extremities  Psychiatry: Judgement and insight appear normal. Mood & affect appropriate.     Data Reviewed: I have personally reviewed following labs and imaging studies  CBC: Recent Labs  Lab 05/14/22 1627  WBC 6.4  HGB 14.2  HCT 42.0  MCV 91.7  PLT [id number]   Basic Metabolic Panel: Recent Labs  Lab 05/14/22 1627 05/15/22 [id number]  NA 134* 135  K 3.7 3.5  CL 102 104  CO2 26 28  GLUCOSE 95 90  BUN 11 9  CREATININE 0.61 0.55*  CALCIUM 8.8* 8.7*   GFR: Estimated Creatinine Clearance: 123.7 mL/min (A) (by C-G formula based on SCr of 0.55 mg/dL (L)). Liver Function Tests: Recent Labs  Lab 05/14/22 1627 05/15/22 0702  AST 277* 206*  ALT 222* 197*  ALKPHOS 77 67  BILITOT 0.8 0.6  PROT 7.7 7.3  ALBUMIN 3.8 3.8   No results for input(s): "LIPASE", "AMYLASE" in the last 168 hours. No results for input(s): "AMMONIA" in the last 168  hours. Coagulation Profile: No results for input(s): "INR", "PROTIME" in the last 168 hours. Cardiac Enzymes: Recent Labs  Lab 05/14/22 1627 05/15/22 0702  CKTOTAL 36,335* 23,921*   BNP (last 3 results) No results for input(s): "PROBNP" in the last 8760 hours. HbA1C: No results for input(s): "HGBA1C" in the last 72 hours. CBG: No results for input(s): "GLUCAP" in the last 168 hours. Lipid Profile: No results for input(s): "CHOL", "HDL", "LDLCALC", "TRIG", "CHOLHDL", "LDLDIRECT" in the last 72 hours. Thyroid Function Tests: No results for input(s): "TSH", "T4TOTAL", "FREET4", "T3FREE", "THYROIDAB" in the last 72 hours. Anemia Panel: No results for input(s): "VITAMINB12", "FOLATE", "FERRITIN", "TIBC", "IRON", "RETICCTPCT" in the last 72 hours. Sepsis Labs: No results for input(s): "PROCALCITON", "LATICACIDVEN" in the last 168 hours.  No results found for this or any previous visit (from the past 240 hour(s)).       Radiology Studies: No results found.      Scheduled Meds:  enoxaparin (LOVENOX) injection  40 mg Subcutaneous Q24H   multivitamin with minerals  1 tablet Oral Daily   sodium chloride flush  3 mL Intravenous Q12H   Continuous Infusions:  lactated ringers 150 mL/hr at 05/15/22 1130     LOS: 0 days    Time spent: 35 mins     Wyvonnia Dusky, MD [facility] Pager [phone]  If 7PM-7AM, please contact night-coverage [email or web address] 05/15/2022, 12:18 PM

## 2022-05-15 NOTE — TOC Initial Note (Signed)
Transition of Care [facility]) - Initial/Assessment Note    Patient Details  Name: Roberto Stanton MRN: [id number] Date of Birth: 30-Dec-1995  Transition of Care [facility]) CM/SW Contact:    Beverly Sessions, RN Phone Number: 05/15/2022, 2:28 PM  Clinical Narrative:                     Transition of Care [facility]) Screening Note   Patient Details  Name: Roberto Stanton Date of Birth: 1995/12/04   Transition of Care [facility]) CM/SW Contact:    Beverly Sessions, RN Phone Number: 05/15/2022, 2:28 PM    Transition of Care Department [facility]) has reviewed patient and no TOC needs have been identified at this time. We will continue to monitor patient advancement through interdisciplinary progression rounds. If new patient transition needs arise, please place a TOC consult.       Patient Goals and CMS Choice            Expected Discharge Plan and Services                                              Prior Living Arrangements/Services                       Activities of Daily Living Home Assistive Devices/Equipment: None ADL Screening (condition at time of admission) Patient's cognitive ability adequate to safely complete daily activities?: Yes Is the patient deaf or have difficulty hearing?: No Does the patient have difficulty seeing, even when wearing glasses/contacts?: No Does the patient have difficulty concentrating, remembering, or making decisions?: No Patient able to express need for assistance with ADLs?: No Does the patient have difficulty dressing or bathing?: No Independently performs ADLs?: Yes (appropriate for developmental age) Does the patient have difficulty walking or climbing stairs?: No Weakness of Legs: None Weakness of Arms/Hands: None  Permission Sought/Granted                  Emotional Assessment              Admission diagnosis:  Rhabdomyolysis [M62.82] Non-traumatic rhabdomyolysis [M62.82] Patient Active Problem List    Diagnosis Date Noted   Hypokalemia 04/29/2022   Hyponatremia 04/01/2022   Intractable pain 03/09/2022   Large tonsils 03/04/2022   Elevated CK 02/11/2022   Muscle cramps 01/27/2022   Non-traumatic rhabdomyolysis, recurrent 01/26/2022   Strep pharyngitis 10/04/2021   Myophosphorylase deficiency (glycogen storage disease V, McArdle disease) ([place]) 09/28/2021   Transaminitis 09/26/2021   Rhabdomyolysis 06/08/2021   Acute respiratory failure ([place]) 01/08/2020   PCP:  Rutherford Limerick, PA Pharmacy:   [facility] [phone] [address], [place] AT [facility] OF [address] & [address] [place] [address] Phone: [phone] Fax: [phone]     Social Determinants of Health (SDOH) Social History: SDOH Screenings   Food Insecurity: No Food Insecurity (05/14/2022)  Housing: Low Risk  (05/14/2022)  Transportation Needs: No Transportation Needs (05/14/2022)  Utilities: Not At Risk (05/14/2022)  Tobacco Use: Medium Risk (05/14/2022)   SDOH Interventions:     Readmission Risk Interventions     No data to display

## 2022-05-16 LAB — CBC
HCT: 42.4 % (ref 39.0–52.0)
Hemoglobin: 14.6 g/dL (ref 13.0–17.0)
MCH: 31.1 pg (ref 26.0–34.0)
MCHC: 34.4 g/dL (ref 30.0–36.0)
MCV: 90.2 fL (ref 80.0–100.0)
Platelets: 223 10*3/uL (ref 150–400)
RBC: 4.7 MIL/uL (ref 4.22–5.81)
RDW: 12.5 % (ref 11.5–15.5)
WBC: 6.3 10*3/uL (ref 4.0–10.5)
nRBC: 0 % (ref 0.0–0.2)

## 2022-05-16 LAB — COMPREHENSIVE METABOLIC PANEL
ALT: 171 U/L — ABNORMAL HIGH (ref 0–44)
AST: 151 U/L — ABNORMAL HIGH (ref 15–41)
Albumin: 3.8 g/dL (ref 3.5–5.0)
Alkaline Phosphatase: 65 U/L (ref 38–126)
Anion gap: 7 (ref 5–15)
BUN: 9 mg/dL (ref 6–20)
CO2: 28 mmol/L (ref 22–32)
Calcium: 9.3 mg/dL (ref 8.9–10.3)
Chloride: 100 mmol/L (ref 98–111)
Creatinine, Ser: 0.58 mg/dL — ABNORMAL LOW (ref 0.61–1.24)
GFR, Estimated: >60 mL/min (ref >60)
Glucose, Bld: 96 mg/dL (ref 70–99)
Potassium: 3.5 mmol/L (ref 3.5–5.1)
Sodium: 135 mmol/L (ref 135–145)
Total Bilirubin: 0.7 mg/dL (ref 0.3–1.2)
Total Protein: 7.6 g/dL (ref 6.5–8.1)

## 2022-05-16 LAB — CK: Total CK: 13777 U/L — ABNORMAL HIGH (ref 49–397)

## 2022-05-16 MED ORDER — OXYCODONE HCL 5 MG PO TABS: 5.0000 mg | ORAL_TABLET | Freq: Four times a day (QID) | ORAL | 0 refills | Status: AC | PRN

## 2022-05-16 NOTE — Discharge Summary (Signed)
Physician Discharge Summary   Roberto Stanton  male DOB: 08/16/1995  [id number]  PCP: Rutherford Limerick, PA  Admit date: 05/14/2022 Discharge date: 05/16/2022  Admitted From: home Disposition:  home CODE STATUS: Full code   Hospital Course:  For full details, please see H&P, progress notes, consult notes and ancillary notes.  Briefly,  Roberto Stanton is a 27 y.o. male with medical history significant of recurrent hospitalizations for rhabdomyolysis secondary to McArdle's disease, chronic opioids use who presented to the ED for concern of rhabdomyolysis.     Roberto Stanton stated that he recently joined a gym and has been working out for the past 3 days.  He has been doing mostly weight lifting.  He has been cautious in using lower weight lifts and keeping up with fluid intake, however he subsequently developed diffuse muscle cramping that were worse in his back, symptom typical for rhabdomyolysis.    Acute Rhabdomyolysis Hx of recurrent rhabdo secondary to weight lifting.  --CK 36,335 on presentation, which is actually typical for his recurrent hospitalizations.  Pt received IVF with CK trending down to 13,777 on the day when pt asked to be discharged.   --Pt was started on IV dilaudid on presentation, which should be avoided in the future, in favor of oral pain med.   Transaminitis:  --likely related to rhabdo, also trending down with IVF.   Myophosphorylase deficiency/glycogen storage disease V, McArdle disease: management per [facility] neurology outpatient   Chronic pain on chronic opioids --avoid IV dilaudid. --pt said he has scheduled outpatient appointment with pain management.   Unless noted above, medications under "STOP" list are ones pt was not taking PTA.  Discharge Diagnoses:  Principal Problem:   Rhabdomyolysis Active Problems:   Myophosphorylase deficiency (glycogen storage disease V, McArdle disease) ([place])   30 Day Unplanned Readmission Risk Score     Flowsheet Row ED to Hosp-Admission (Current) from 05/14/2022 in [place]  30 Day Unplanned Readmission Risk Score (%) 31.07 Filed at 05/16/2022 0801       This score is the patient's risk of an unplanned readmission within 30 days of being discharged (0 -100%). The score is based on dignosis, age, lab data, medications, orders, and past utilization.   Low:  0-14.9   Medium: 15-21.9   High: 22-29.9   Extreme: 30 and above         Discharge Instructions:  Allergies as of 05/16/2022   No Active Allergies      Medication List     STOP taking these medications    acetaminophen 325 MG tablet Commonly known as: TYLENOL   DULoxetine 30 MG capsule Commonly known as: CYMBALTA   ibuprofen 400 MG tablet Commonly known as: ADVIL   pantoprazole 40 MG tablet Commonly known as: PROTONIX   potassium chloride 10 MEQ tablet Commonly known as: KLOR-CON M       TAKE these medications    oxyCODONE 5 MG immediate release tablet Commonly known as: Oxy IR/ROXICODONE Take 1 tablet (5 mg total) by mouth every 6 (six) hours as needed for up to 3 days for moderate pain or severe pain. What changed:  medication strength reasons to take this         Follow-up Information     Whitten, Robin A, PA Follow up in 1 week(s).   Specialty: Physician Assistant Contact information: [address] [place] [address] [phone]  No Active Allergies   The results of significant diagnostics from this hospitalization (including imaging, microbiology, ancillary and laboratory) are listed below for reference.   Consultations:   Procedures/Studies: DG Chest 2 View  Result Date: 04/26/2022 CLINICAL DATA:  History of muscular disorder and pericarditis with chest pain and dizziness EXAM: CHEST - 2 VIEW COMPARISON:  Chest radiograph dated 03/31/2022 FINDINGS: Normal lung volumes. No focal consolidations. No pleural effusion or  pneumothorax. The heart size and mediastinal contours are within normal limits. The visualized skeletal structures are unremarkable. IMPRESSION: No active cardiopulmonary disease. Electronically Signed   By: Darrin Nipper M.D.   On: 04/26/2022 17:46      Labs: BNP (last 3 results) No results for input(s): "BNP" in the last 8760 hours. Basic Metabolic Panel: Recent Labs  Lab 05/14/22 1627 05/15/22 0702 05/16/22 0634  NA 134* 135 135  K 3.7 3.5 3.5  CL 102 104 100  CO2 26 28 28   GLUCOSE 95 90 96  BUN 11 9 9   CREATININE 0.61 0.55* 0.58*  CALCIUM 8.8* 8.7* 9.3   Liver Function Tests: Recent Labs  Lab 05/14/22 1627 05/15/22 0702 05/16/22 0634  AST 277* 206* 151*  ALT 222* 197* 171*  ALKPHOS 77 67 65  BILITOT 0.8 0.6 0.7  PROT 7.7 7.3 7.6  ALBUMIN 3.8 3.8 3.8   No results for input(s): "LIPASE", "AMYLASE" in the last 168 hours. No results for input(s): "AMMONIA" in the last 168 hours. CBC: Recent Labs  Lab 05/14/22 1627 05/16/22 0634  WBC 6.4 6.3  HGB 14.2 14.6  HCT 42.0 42.4  MCV 91.7 90.2  PLT 252 223   Cardiac Enzymes: Recent Labs  Lab 05/14/22 1627 05/15/22 0702 05/16/22 0634  CKTOTAL 36,335* 23,921* 13,777*   BNP: Invalid input(s): "POCBNP" CBG: No results for input(s): "GLUCAP" in the last 168 hours. D-Dimer No results for input(s): "DDIMER" in the last 72 hours. Hgb A1c No results for input(s): "HGBA1C" in the last 72 hours. Lipid Profile No results for input(s): "CHOL", "HDL", "LDLCALC", "TRIG", "CHOLHDL", "LDLDIRECT" in the last 72 hours. Thyroid function studies No results for input(s): "TSH", "T4TOTAL", "T3FREE", "THYROIDAB" in the last 72 hours.  Invalid input(s): "FREET3" Anemia work up No results for input(s): "VITAMINB12", "FOLATE", "FERRITIN", "TIBC", "IRON", "RETICCTPCT" in the last 72 hours. Urinalysis    Component Value Date/Time   COLORURINE COLORLESS (A) 05/07/2022 0741   APPEARANCEUR CLEAR (A) 05/07/2022 0741   LABSPEC 1.004 (L)  05/07/2022 0741   PHURINE 6.0 05/07/2022 0741   GLUCOSEU NEGATIVE 05/07/2022 0741   HGBUR NEGATIVE 05/07/2022 0741   BILIRUBINUR NEGATIVE 05/07/2022 0741   KETONESUR NEGATIVE 05/07/2022 0741   PROTEINUR NEGATIVE 05/07/2022 0741   NITRITE NEGATIVE 05/07/2022 0741   LEUKOCYTESUR NEGATIVE 05/07/2022 0741   Sepsis Labs Recent Labs  Lab 05/14/22 1627 05/16/22 0634  WBC 6.4 6.3   Microbiology No results found for this or any previous visit (from the past 240 hour(s)).   Total time spend on discharging this patient, including the last patient exam, discussing the hospital stay, instructions for ongoing care as it relates to all pertinent caregivers, as well as preparing the medical discharge records, prescriptions, and/or referrals as applicable, is 40 minutes.    Enzo Bi, MD  [facility] 05/16/2022, 10:33 AM

## 2022-05-22 ENCOUNTER — Other Ambulatory Visit: Payer: Self-pay

## 2022-05-22 ENCOUNTER — Emergency Department
Admission: EM | Admit: 2022-05-22 | Discharge: 2022-05-22 | Disposition: A | Discharge status: Home / Self Care | Payer: Medicaid Other | Attending: Emergency Medicine | Admitting: Emergency Medicine

## 2022-05-22 ENCOUNTER — Emergency Department: Payer: Medicaid Other

## 2022-05-22 DIAGNOSIS — M6282 Rhabdomyolysis: Secondary | ICD-10-CM | POA: Diagnosis not present

## 2022-05-22 DIAGNOSIS — R52 Pain, unspecified: Secondary | ICD-10-CM | POA: Diagnosis present

## 2022-05-22 LAB — URINALYSIS, ROUTINE W REFLEX MICROSCOPIC
Bilirubin Urine: NEGATIVE
Glucose, UA: NEGATIVE mg/dL
Hgb urine dipstick: NEGATIVE
Ketones, ur: NEGATIVE mg/dL
Leukocytes,Ua: NEGATIVE
Nitrite: NEGATIVE
Protein, ur: NEGATIVE mg/dL
Specific Gravity, Urine: 1.003 — ABNORMAL LOW (ref 1.005–1.030)
pH: 7 (ref 5.0–8.0)

## 2022-05-22 LAB — BASIC METABOLIC PANEL
Anion gap: 6 (ref 5–15)
BUN: 12 mg/dL (ref 6–20)
CO2: 29 mmol/L (ref 22–32)
Calcium: 9 mg/dL (ref 8.9–10.3)
Chloride: 102 mmol/L (ref 98–111)
Creatinine, Ser: 0.72 mg/dL (ref 0.61–1.24)
GFR, Estimated: >60 mL/min (ref >60)
Glucose, Bld: 92 mg/dL (ref 70–99)
Potassium: 3.7 mmol/L (ref 3.5–5.1)
Sodium: 137 mmol/L (ref 135–145)

## 2022-05-22 LAB — CK: Total CK: 30168 U/L — ABNORMAL HIGH (ref 49–397)

## 2022-05-22 LAB — HEPATIC FUNCTION PANEL
ALT: 114 U/L — ABNORMAL HIGH (ref 0–44)
AST: 200 U/L — ABNORMAL HIGH (ref 15–41)
Albumin: 3.9 g/dL (ref 3.5–5.0)
Alkaline Phosphatase: 68 U/L (ref 38–126)
Bilirubin, Direct: 0.2 mg/dL (ref 0.0–0.2)
Indirect Bilirubin: 1.1 mg/dL — ABNORMAL HIGH (ref 0.3–0.9)
Total Bilirubin: 1.3 mg/dL — ABNORMAL HIGH (ref 0.3–1.2)
Total Protein: 7.5 g/dL (ref 6.5–8.1)

## 2022-05-22 LAB — CBC
HCT: 42.8 % (ref 39.0–52.0)
Hemoglobin: 14.5 g/dL (ref 13.0–17.0)
MCH: 31.1 pg (ref 26.0–34.0)
MCHC: 33.9 g/dL (ref 30.0–36.0)
MCV: 91.8 fL (ref 80.0–100.0)
Platelets: 232 10*3/uL (ref 150–400)
RBC: 4.66 MIL/uL (ref 4.22–5.81)
RDW: 12.9 % (ref 11.5–15.5)
WBC: 5.9 10*3/uL (ref 4.0–10.5)
nRBC: 0 % (ref 0.0–0.2)

## 2022-05-22 MED ORDER — OXYCODONE HCL 5 MG PO TABS
10.0000 mg | ORAL_TABLET | Freq: Once | ORAL | Status: AC
  Administered 2022-05-22: 10 mg via ORAL
  Filled 2022-05-22: qty 2

## 2022-05-22 MED ORDER — OXYCODONE-ACETAMINOPHEN 10-325 MG PO TABS: 1.0000 | ORAL_TABLET | Freq: Three times a day (TID) | ORAL | 0 refills | Status: DC | PRN

## 2022-05-22 MED ORDER — HYDROMORPHONE HCL 1 MG/ML IJ SOLN
1.0000 mg | Freq: Once | INTRAMUSCULAR | Status: AC
  Administered 2022-05-22: 1 mg via INTRAVENOUS
  Filled 2022-05-22: qty 1

## 2022-05-22 MED ORDER — SODIUM CHLORIDE 0.9 % IV BOLUS
1000.0000 mL | Freq: Once | INTRAVENOUS | Status: AC
  Administered 2022-05-22: 1000 mL via INTRAVENOUS

## 2022-05-22 MED ORDER — OXYCODONE HCL 5 MG PO CAPS: 10.0000 mg | ORAL_CAPSULE | Freq: Three times a day (TID) | ORAL | 0 refills | Status: DC | PRN

## 2022-05-22 NOTE — Discharge Instructions (Addendum)
Take the pain medicine as prescribed.  Follow-up with pain management specialist or your provider as needed

## 2022-05-22 NOTE — ED Triage Notes (Signed)
Pt here with body aches and a high CK. Pt states he has had this in the past and feels the same. Pt denies fever, N,V, or D.

## 2022-05-22 NOTE — ED Provider Notes (Signed)
[facility] Emergency Department Provider Note     Event Date/Time   First MD Initiated Contact with Patient 05/22/22 1118     (approximate)   History   Generalized Body Aches   HPI  Roberto Stanton is a 27 y.o. male with a history of McArdle disease, and chronic nontraumatic rhabdomyolysis, evidenced by CKs in the 30,000's typically, presents to the ED with bodyaches and suspected high CK.  Patient had an admission last week with similar symptoms for pain control.  He denies any recent injury, trauma, fall, chest pain, or shortness of breath.  Denies any nausea, vomiting, diarrhea.  Patient is currently under the care of pain management and neurology.  He does endorse pain secondary to the fact that his previous prescription for pain medicine was only partially filled, secondary to the need for prior authorization.     Physical Exam   Triage Vital Signs: ED Triage Vitals  Enc Vitals Group     BP 05/22/22 1027 115/79     Pulse Rate 05/22/22 1027 79     Resp 05/22/22 1027 18     Temp 05/22/22 1027 98.3 F (36.8 C)     Temp Source 05/22/22 1027 Oral     SpO2 05/22/22 1027 98 %     Weight 05/22/22 1028 140 lb 3.4 oz (63.6 kg)     Height 05/22/22 1028 5\' 9"  (1.753 m)     Head Circumference --      Peak Flow --      Pain Score 05/22/22 1028 8     Pain Loc --      Pain Edu? --      Excl. in [place]? --     Most recent vital signs: Vitals:   05/22/22 1027 05/22/22 1445  BP: 115/79 114/76  Pulse: 79 80  Resp: 18 18  Temp: 98.3 F (36.8 C) 98.3 F (36.8 C)  SpO2: 98% 98%    General Awake, no distress. NAD HEENT NCAT. PERRL. EOMI. No rhinorrhea. Mucous membranes are moist.  CV:  Good peripheral perfusion.  RESP:  Normal effort.  ABD:  No distention.  MSK:   Normal, Active range of motion of all extremities. NEURO: Nerves II to XII grossly intact.   ED Results / Procedures / Treatments   Labs (all labs ordered are listed, but only  abnormal results are displayed) Labs Reviewed  CK - Abnormal; Notable for the following components:      Result Value   Total CK 30,168 (*)    All other components within normal limits  URINALYSIS, ROUTINE W REFLEX MICROSCOPIC - Abnormal; Notable for the following components:   Color, Urine COLORLESS (*)    APPearance CLEAR (*)    Specific Gravity, Urine 1.003 (*)    All other components within normal limits  HEPATIC FUNCTION PANEL - Abnormal; Notable for the following components:   AST 200 (*)    ALT 114 (*)    Total Bilirubin 1.3 (*)    Indirect Bilirubin 1.1 (*)    All other components within normal limits  CBC  BASIC METABOLIC PANEL     EKG   RADIOLOGY  I personally viewed and evaluated these images as part of my medical decision making, as well as reviewing the written report by the radiologist.  ED Provider Interpretation: no acute findings  US Abdomen Limited RUQ (LIVER/GB)  Result Date: 05/22/2022 CLINICAL DATA:  [id number] Bilirubinemia [id number] EXAM: ULTRASOUND ABDOMEN LIMITED COMPARISON:  11/01/2019. FINDINGS: The liver demonstrates normal parenchymal echogenicity and homogeneous texture without focal hepatic parenchymal lesions or intrahepatic ductal dilatation. Hepatopetal portal vein. The gallbladder demonstrates no stones, wall thickening or pericholecystic fluid. CBD measured 0.3cm. IMPRESSION: Unremarkable examination of the right upper quadrant. Electronically Signed   By: Sammie Bench M.D.   On: 05/22/2022 14:25     PROCEDURES:  Critical Care performed: No  Procedures   MEDICATIONS ORDERED IN ED: Medications  sodium chloride 0.9 % bolus 1,000 mL (0 mLs Intravenous Stopped 05/22/22 1234)  HYDROmorphone (DILAUDID) injection 1 mg (1 mg Intravenous Given 05/22/22 1152)  sodium chloride 0.9 % bolus 1,000 mL (0 mLs Intravenous Stopped 05/22/22 1342)  HYDROmorphone (DILAUDID) injection 1 mg (1 mg Intravenous Given 05/22/22 1332)  oxyCODONE (Oxy IR/ROXICODONE) immediate  release tablet 10 mg (10 mg Oral Given 05/22/22 1507)     IMPRESSION / MDM / ASSESSMENT AND PLAN / ED COURSE  I reviewed the triage vital signs and the nursing notes.                              Differential diagnosis includes, but is not limited to, nontraumatic rhabdo, gallbladder disease, acute cholelithiasis  Patient's presentation is most consistent with acute complicated illness / injury requiring diagnostic workup.  Patient's diagnosis is consistent with baseline nontraumatic rhabdo myelitis with evidence of elevated CK.  Patient CK today at 30,000+, consistent stable elevated liver transaminase noted..  Patient reporting significant proved of his symptoms after fluid bolus and IV pain medication.  Patient given the option for admission at this time, but he feels he can manage his pain with p.o. pain medicines.  With reassuring exam overall no evidence of acute cholelithiasis despite elevated T. bili at this presentation.  Patient will be discharged home with prescriptions for oxycodone IR 5 mg (#30). Patient is to follow up with pain management specialist or neurologist as needed or otherwise directed. Patient is given ED precautions to return to the ED for any worsening or new symptoms.   FINAL CLINICAL IMPRESSION(S) / ED DIAGNOSES   Final diagnoses:  Non-traumatic rhabdomyolysis     Rx / DC Orders   ED Discharge Orders          Ordered    oxyCODONE-acetaminophen (PERCOCET) 10-325 MG tablet  Every 8 hours PRN,   Status:  Discontinued        05/22/22 1502    oxycodone (OXY-IR) 5 MG capsule  3 times daily PRN        05/22/22 1600             Note:  This document was prepared using Dragon voice recognition software and may include unintentional dictation errors.    Melvenia Needles, PA-C 05/22/22 2025    Carrie Mew, MD 05/23/22 [phone]

## 2022-05-25 ENCOUNTER — Encounter: Payer: Self-pay | Admitting: Internal Medicine

## 2022-05-25 ENCOUNTER — Emergency Department
Admission: EM | Admit: 2022-05-25 | Discharge: 2022-05-25 | Disposition: A | Discharge status: Home / Self Care | Payer: Medicaid Other | Attending: Emergency Medicine | Admitting: Emergency Medicine

## 2022-05-25 ENCOUNTER — Other Ambulatory Visit: Payer: Self-pay

## 2022-05-25 DIAGNOSIS — R748 Abnormal levels of other serum enzymes: Secondary | ICD-10-CM | POA: Diagnosis present

## 2022-05-25 DIAGNOSIS — M549 Dorsalgia, unspecified: Secondary | ICD-10-CM | POA: Diagnosis present

## 2022-05-25 DIAGNOSIS — M6282 Rhabdomyolysis: Secondary | ICD-10-CM | POA: Insufficient documentation

## 2022-05-25 DIAGNOSIS — R252 Cramp and spasm: Secondary | ICD-10-CM | POA: Diagnosis not present

## 2022-05-25 DIAGNOSIS — G8929 Other chronic pain: Secondary | ICD-10-CM | POA: Insufficient documentation

## 2022-05-25 DIAGNOSIS — E7404 McArdle disease: Secondary | ICD-10-CM | POA: Diagnosis present

## 2022-05-25 LAB — CBC WITH DIFFERENTIAL/PLATELET
Abs Immature Granulocytes: 0.01 10*3/uL (ref 0.00–0.07)
Basophils Absolute: 0.1 10*3/uL (ref 0.0–0.1)
Basophils Relative: 1 %
Eosinophils Absolute: 0.1 10*3/uL (ref 0.0–0.5)
Eosinophils Relative: 3 %
HCT: 45.1 % (ref 39.0–52.0)
Hemoglobin: 15.5 g/dL (ref 13.0–17.0)
Immature Granulocytes: 0 %
Lymphocytes Relative: 33 %
Lymphs Abs: 1.8 10*3/uL (ref 0.7–4.0)
MCH: 31.3 pg (ref 26.0–34.0)
MCHC: 34.4 g/dL (ref 30.0–36.0)
MCV: 91.1 fL (ref 80.0–100.0)
Monocytes Absolute: 0.5 10*3/uL (ref 0.1–1.0)
Monocytes Relative: 9 %
Neutro Abs: 3 10*3/uL (ref 1.7–7.7)
Neutrophils Relative %: 54 %
Platelets: 241 10*3/uL (ref 150–400)
RBC: 4.95 MIL/uL (ref 4.22–5.81)
RDW: 12.8 % (ref 11.5–15.5)
WBC: 5.5 10*3/uL (ref 4.0–10.5)
nRBC: 0 % (ref 0.0–0.2)

## 2022-05-25 LAB — BASIC METABOLIC PANEL
Anion gap: 8 (ref 5–15)
BUN: 14 mg/dL (ref 6–20)
CO2: 27 mmol/L (ref 22–32)
Calcium: 9.2 mg/dL (ref 8.9–10.3)
Chloride: 102 mmol/L (ref 98–111)
Creatinine, Ser: 0.69 mg/dL (ref 0.61–1.24)
GFR, Estimated: >60 mL/min (ref >60)
Glucose, Bld: 93 mg/dL (ref 70–99)
Potassium: 3.8 mmol/L (ref 3.5–5.1)
Sodium: 137 mmol/L (ref 135–145)

## 2022-05-25 LAB — CK: Total CK: 6820 U/L — ABNORMAL HIGH (ref 49–397)

## 2022-05-25 MED ORDER — SODIUM CHLORIDE 0.9 % IV BOLUS
1000.0000 mL | Freq: Once | INTRAVENOUS | Status: AC
  Administered 2022-05-25: 1000 mL via INTRAVENOUS

## 2022-05-25 MED ORDER — HYDROMORPHONE HCL 1 MG/ML IJ SOLN
1.0000 mg | Freq: Once | INTRAMUSCULAR | Status: AC
  Administered 2022-05-25: 1 mg via INTRAVENOUS
  Filled 2022-05-25: qty 1

## 2022-05-25 MED ORDER — OXYCODONE HCL 10 MG PO TABS: 10.0000 mg | ORAL_TABLET | Freq: Four times a day (QID) | ORAL | 0 refills | Status: AC | PRN

## 2022-05-25 MED ORDER — OXYCODONE-ACETAMINOPHEN 10-325 MG PO TABS: 1.0000 | ORAL_TABLET | Freq: Four times a day (QID) | ORAL | 0 refills | Status: DC | PRN

## 2022-05-25 NOTE — ED Provider Notes (Signed)
[facility] Provider Note    Event Date/Time   First MD Initiated Contact with Patient 05/25/22 [id number]     (approximate)   History   Abnormal Lab   HPI  Roberto Stanton is a 27 y.o. male  with a history of McArdle's disease and chronic rhabdomyolysis who presents with persistent pain primarily to his back and legs consistent with his chronic pain but worsened over the last few days.  The patient was in the ER several days ago and states that he was offered admission but elected to go home.  However, his symptoms have not been relieved by his pain medications at home.  He denies any weakness or numbness.  He has no fever or chills.  He does report dark urine which he has had for some time.  I reviewed the past medical records.  The patient was most recently admitted between 3/25 and 3/27 and treated for rhabdomyolysis with an initial CK of 36,000.Marland Kitchen.  He was subsequently seen in the ED on 4/2 with a CK in a similar range although his pain was well-controlled at that time.   Physical Exam   Triage Vital Signs: ED Triage Vitals  Enc Vitals Group     BP 05/25/22 0908 135/84     Pulse Rate 05/25/22 0908 95     Resp 05/25/22 0908 16     Temp 05/25/22 0907 97.7 F (36.5 C)     Temp Source 05/25/22 0907 Oral     SpO2 05/25/22 0908 99 %     Weight 05/25/22 0907 140 lb 3.4 oz (63.6 kg)     Height 05/25/22 0907 5\' 9"  (1.753 m)     Head Circumference --      Peak Flow --      Pain Score 05/25/22 0907 8     Pain Loc --      Pain Edu? --      Excl. in [place]? --     Most recent vital signs: Vitals:   05/25/22 1110 05/25/22 1341  BP: 117/80 (!) 131/91  Pulse: 89 89  Resp: 16 16  Temp: 98.1 F (36.7 C)   SpO2: 100% 100%     General: Awake, no distress.  CV:  Good peripheral perfusion.  Resp:  Normal effort.  Abd:  No distention.  Other:  No peripheral edema.   ED Results / Procedures / Treatments   Labs (all labs ordered are listed, but only abnormal  results are displayed) Labs Reviewed  CK - Abnormal; Notable for the following components:      Result Value   Total CK 6,820 (*)    All other components within normal limits  BASIC METABOLIC PANEL  CBC WITH DIFFERENTIAL/PLATELET     EKG    RADIOLOGY    PROCEDURES:  Critical Care performed: No  Procedures   MEDICATIONS ORDERED IN ED: Medications  sodium chloride 0.9 % bolus 1,000 mL (0 mLs Intravenous Stopped 05/25/22 1255)  HYDROmorphone (DILAUDID) injection 1 mg (1 mg Intravenous Given 05/25/22 0944)  HYDROmorphone (DILAUDID) injection 1 mg (1 mg Intravenous Given 05/25/22 1106)  sodium chloride 0.9 % bolus 1,000 mL (0 mLs Intravenous Stopped 05/25/22 1342)  HYDROmorphone (DILAUDID) injection 1 mg (1 mg Intravenous Given 05/25/22 1254)     IMPRESSION / MDM / ASSESSMENT AND PLAN / ED COURSE  I reviewed the triage vital signs and the nursing notes.  27 year old male with a history of McArdle's disease and recurrent admissions for  rhabdomyolysis presents with persistent pain not relieved by his home medications.  Differential diagnosis includes, but is not limited to, exacerbation of his McArdle's disease, rhabdomyolysis, dehydration, electrolyte abnormality, other metabolic disturbance.  We will give fluids, obtain lab workup including a CK, and reassess.  Patient's presentation is most consistent with exacerbation of chronic illness.  ----------------------------------------- 1:13 PM on 05/25/2022 -----------------------------------------  CK is significantly elevated but lower than it has been on the patient's recent ED visits and admissions.  Creatinine is normal.  The patient reports somewhat improved pain but states that he still feels quite uncomfortable.  I have ordered a second liter of fluid and a third dose of Dilaudid.  I had a thorough discussion with the patient about disposition.  I did consider admission given his persistent pain as well as elevated CK,  however given that the CK is actually improved the admission would be primarily for pain control as the patient states that his pain is not well enough controlled on his current oral medications.  I consulted Dr. Sedalia Muta from the hospitalist service to evaluate the patient.  She also had an extensive discussion with him and the patient decided that he did not need admission.  I reviewed the patient's record in the PDMP.  He reports that he has pain management follow-up scheduled for next week.  Therefore I have prescribed an additional small quantity of oxycodone 10 mg to cover him until he is able to follow-up.  I gave strict return precautions and he expressed understanding.  FINAL CLINICAL IMPRESSION(S) / ED DIAGNOSES   Final diagnoses:  Non-traumatic rhabdomyolysis  Other chronic pain     Rx / DC Orders   ED Discharge Orders          Ordered    oxyCODONE-acetaminophen (PERCOCET) 10-325 MG tablet  Every 6 hours PRN,   Status:  Discontinued        05/25/22 1312    oxyCODONE 10 MG TABS  Every 6 hours PRN        05/25/22 1350             Note:  This document was prepared using Dragon voice recognition software and may include unintentional dictation errors.    Dionne Bucy, MD 05/25/22 1521

## 2022-05-25 NOTE — ED Triage Notes (Signed)
Pt here with abnormal labs. Pt thinks that his CK level may be elevated due to his pain returning. Pt was here recently for same and was offered admission but declined. Pt ambulatory to triage.

## 2022-05-25 NOTE — Assessment & Plan Note (Addendum)
Downtrending, compared to last month labs Avoid IV Dilaudid in the future

## 2022-05-25 NOTE — Discharge Instructions (Signed)
Take your oxycodone as prescribed.  Follow-up with pain management next week as planned.  Return to the ER for new, worsening, or persistent severe pain, weakness or lightheadedness, or any other new or worsening symptoms that concern you.

## 2022-05-25 NOTE — Hospital Course (Signed)
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  Initial vitals in the emergency department showed temperature of 97.7, respiration rate of 16, heart rate 95, blood pressure 135/85, SpO2 of 99% on room air.  Serum sodium is 137, potassium 3.7, chloride 102, bicarb 27, BUN of 14, serum creatinine of 0.69, EGFR greater than 60.  WBC 5.5, hemoglobin 15.5, platelets of 241 EGFR greater than 60.  CK level was 6820.  ED treatment: Dilaudid 1 mg IV, 3 treatments at 944, 1106, 1254.  Patient also received sodium chloride 1 L bolus x 2.

## 2022-05-25 NOTE — Assessment & Plan Note (Signed)
Extensive discussion at bedside regarding maintaining hydration, drinking appropriate water, eating a healthy diet with balanced nutrition Avoid alcohol, tobacco use, drug use as he is currently Avoid excessive physical activity, take it easy until his outpatient pain management visit next week Patient endorses understanding and compliance

## 2022-05-25 NOTE — Consult Note (Addendum)
History and Physical   Roberto Stanton ZOX:[id number] DOB: 1995/09/29 DOA: 05/25/2022  PCP: Pcp, No  Patient coming from: home  I have personally briefly reviewed [facility]  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  Initial vitals in the emergency department showed temperature of 97.7, respiration rate of 16, heart rate 95, blood pressure 135/85, SpO2 of 99% on room air.  Serum sodium is 137, potassium 3.7, chloride 102, bicarb 27, BUN of 14, serum creatinine of 0.69, EGFR greater than 60.  WBC 5.5, hemoglobin 15.5, platelets of 241 EGFR greater than 60.  CK level was 6820.  ED treatment: Dilaudid 1 mg IV, 3 treatments at 944, 1106, 1254.  Patient also received sodium chloride 1 L bolus x 2. ------------------------------ At bedside patient is awake alert and oriented x 4 and using his smart phone.  He does not appear to be in acute distress.  Patient reports that his pain is controlled at this time. Patient reports that he has home oxycodone.  We had a long discussion at bedside.  I explained that patient's vitals are normal, labs were unremarkable with an improving CK level compared to last 1 month.  I explained to Roberto Stanton that his baseline equilibrium pain tolerance has now been changed due to the chronic pain medication requirements and frequent hospitalization in which patient received IV Dilaudid, IV morphine, etc.   I addressed Roberto Stanton's concern that his kidneys may be damaged. I assured him that his renal function is normal and unchanged from his baseline.  I explained the risk and benefits of IV pain medication, and it has the ability to change neurological/psychological requirements for pain. Additionally, a stubbed toe  before would feel like an 8/10 and now may feel like 20/10. Roberto Stanton endorses understanding of this new tolerance level and he wishes to go home and take his home pain medications as prescribed.   Social history: He denies tobacco, etoh, and recreational drug use.   ROS: Constitutional: no weight change, no fever ENT/Mouth: no sore throat, no rhinorrhea Eyes: no eye pain, no vision changes Cardiovascular: no chest pain, no dyspnea,  no edema, no palpitations Respiratory: no cough, no sputum, no wheezing Gastrointestinal: no nausea, no vomiting, no diarrhea, no constipation Genitourinary: no urinary incontinence, no dysuria, no hematuria Musculoskeletal: no arthralgias, + myalgias Skin: no skin lesions, no pruritus, Neuro: + weakness, no loss of consciousness, no syncope Psych: no anxiety, no depression, no decrease appetite Heme/Lymph: no bruising, no bleeding  ED Course: Discussed with emergency medicine provider, EDP wanted to admit patient for pain control.  Assessment/Plan  Principal Problem:   Muscle cramps Active Problems:   Myophosphorylase deficiency (glycogen storage disease V, McArdle disease) (HCC)   Elevated CK   Assessment and Plan:  * Muscle cramps Patient reports his pain is controlled currently and he has home oxycodone and that he will continue to use this as prescribed Avoid IV Dilaudid in the future  Myophosphorylase deficiency (glycogen storage disease V, McArdle disease) (HCC) Extensive discussion at bedside regarding maintaining hydration, drinking appropriate water, eating a healthy diet with balanced nutrition Avoid alcohol, tobacco use, drug use as he is currently Avoid excessive physical activity, take it easy until his outpatient pain management visit next week Patient endorses understanding and compliance  Elevated CK  Downtrending, compared to last month labs Avoid IV Dilaudid in the future  I advised Roberto Stanton that should he develop fever,  nausea, vomiting, diarrhea, sustained any trauma to his person, changes to his pain, bleeding in any way, patient to return to the emergency department for further evaluation.  Recommend discharge from the emergency department, go home take it easy.  Use home pain medication as prescribed.  Patient is in agreement with this plan, endorses his understanding, and reports that he understands that should anything change he is to return to the emergency department.  Plan was discussed with ED provider.  Chart reviewed.   Patient has had 8 hospitalization since 2024 for nontraumatic rhabdomyolysis, elevated CK level.  Diet: regular diet, healthy and balace  Past Medical History:  Diagnosis Date   Kidney stones    McArdle disease 09/28/2021   Pericarditis    Rhabdomyolysis    History reviewed. No pertinent surgical history.  Social History:  reports that he has quit smoking. He has never used smokeless tobacco. He reports that he does not currently use alcohol. He reports that he does not currently use drugs after having used the following drugs: Marijuana.  Allergies  Allergen Reactions   Morphine Hives   Family History  Problem Relation Age of Onset   Heart disease Father    Family history: Family history reviewed and not pertinent.  Prior to Admission medications   Medication Sig Start Date End Date Taking? Authorizing Provider  oxyCODONE-acetaminophen (PERCOCET) 10-325 MG tablet Take 1 tablet by mouth every 6 (six) hours as needed for up to 5 days for pain. 05/25/22 05/30/22 Yes Dionne Bucy, MD  oxycodone (OXY-IR) 5 MG capsule Take 2 capsules (10 mg total) by mouth 3 (three) times daily as needed for up to 5 days for pain. 05/22/22 05/27/22  Menshew, Charlesetta Ivory, PA-C   Physical Exam: Vitals:   05/25/22 0907 05/25/22 0908 05/25/22 1110  BP:  135/84 117/80  Pulse:  95 89  Resp:  16 16  Temp: 97.7 F (36.5 C)  98.1 F (36.7 C)  TempSrc: Oral  Oral  SpO2:  99% 100%   Weight: 63.6 kg    Height: 5\' 9"  (1.753 m)     Constitutional: appears age-appropriate, NAD, calm, comfortable Eyes: PERRL, lids and conjunctivae normal ENMT: Mucous membranes are moist. Age-appropriate dentition. Hearing appropriate Neck: normal, supple, no masses, no thyromegaly Respiratory:  no wheezing, no crackles. Normal respiratory effort. No accessory muscle use.  Cardiovascular: Regular rate and rhythm. No extremity edema.  Musculoskeletal: no clubbing / cyanosis. No joint deformity upper and lower extremities. Good ROM, no contractures, no atrophy. Normal muscle tone.  Skin: no rashes, lesions, ulcers. No induration Neurologic: Sensation intact. Strength 5/5 in all 4.  Psychiatric: Normal judgment and insight. Alert and oriented x 3. Normal mood.   EKG: Not indicated at this time Chest x-ray on Admission: Not indicated at this time  Labs on Admission: I have personally reviewed following labs  CBC: Recent Labs  Lab 05/22/22 1029 05/25/22 0925  WBC 5.9 5.5  NEUTROABS  --  3.0  HGB 14.5 15.5  HCT 42.8 45.1  MCV 91.8 91.1  PLT 232 241   Basic Metabolic Panel: Recent Labs  Lab 05/22/22 1029 05/25/22 0925  NA 137 137  K 3.7 3.8  CL 102 102  CO2 29 27  GLUCOSE 92 93  BUN 12 14  CREATININE 0.72 0.69  CALCIUM 9.0 9.2   GFR: Estimated Creatinine Clearance: 123.7  mL/min (by C-G formula based on SCr of 0.69 mg/dL).  Liver Function Tests: Recent Labs  Lab 05/22/22 1029  AST 200*  ALT 114*  ALKPHOS 68  BILITOT 1.3*  PROT 7.5  ALBUMIN 3.9   Cardiac Enzymes: Recent Labs  Lab 05/22/22 1029 05/25/22 0925  CKTOTAL 30,168* 6,820*   Urine analysis:    Component Value Date/Time   COLORURINE COLORLESS (A) 05/22/2022 1029   APPEARANCEUR CLEAR (A) 05/22/2022 1029   LABSPEC 1.003 (L) 05/22/2022 1029   PHURINE 7.0 05/22/2022 1029   GLUCOSEU NEGATIVE 05/22/2022 1029   HGBUR NEGATIVE 05/22/2022 1029   BILIRUBINUR NEGATIVE 05/22/2022 1029   KETONESUR  NEGATIVE 05/22/2022 1029   PROTEINUR NEGATIVE 05/22/2022 1029   NITRITE NEGATIVE 05/22/2022 1029   LEUKOCYTESUR NEGATIVE 05/22/2022 1029   This document was prepared using Dragon Voice Recognition software and may include unintentional dictation errors.  Dr. Sedalia Mutaox [facility]  If 7PM-7AM, please contact overnight-coverage provider If 7AM-7PM, please contact day coverage provider [email or web address]  05/25/2022, 1:40 PM

## 2022-05-25 NOTE — Assessment & Plan Note (Addendum)
Patient reports his pain is controlled currently and he has home oxycodone and that he will continue to use this as prescribed Avoid IV Dilaudid in the future

## 2022-12-01 ENCOUNTER — Inpatient Hospital Stay
Admission: EM | Admit: 2022-12-01 | Discharge: 2022-12-02 | DRG: 565 | Disposition: A | Discharge status: Home / Self Care | Payer: Medicaid Other | Attending: Internal Medicine | Admitting: Internal Medicine

## 2022-12-01 ENCOUNTER — Encounter: Payer: Self-pay | Admitting: Family Medicine

## 2022-12-01 ENCOUNTER — Other Ambulatory Visit: Payer: Self-pay

## 2022-12-01 DIAGNOSIS — I319 Disease of pericardium, unspecified: Secondary | ICD-10-CM | POA: Diagnosis present

## 2022-12-01 DIAGNOSIS — G8929 Other chronic pain: Secondary | ICD-10-CM

## 2022-12-01 DIAGNOSIS — E7404 McArdle disease: Secondary | ICD-10-CM | POA: Diagnosis present

## 2022-12-01 DIAGNOSIS — T796XXA Traumatic ischemia of muscle, initial encounter: Secondary | ICD-10-CM | POA: Diagnosis present

## 2022-12-01 DIAGNOSIS — R7401 Elevation of levels of liver transaminase levels: Secondary | ICD-10-CM | POA: Diagnosis present

## 2022-12-01 DIAGNOSIS — Z87442 Personal history of urinary calculi: Secondary | ICD-10-CM | POA: Diagnosis not present

## 2022-12-01 DIAGNOSIS — Z8249 Family history of ischemic heart disease and other diseases of the circulatory system: Secondary | ICD-10-CM | POA: Diagnosis not present

## 2022-12-01 DIAGNOSIS — Z1152 Encounter for screening for COVID-19: Secondary | ICD-10-CM | POA: Diagnosis not present

## 2022-12-01 DIAGNOSIS — M6282 Rhabdomyolysis: Secondary | ICD-10-CM | POA: Diagnosis present

## 2022-12-01 DIAGNOSIS — Z885 Allergy status to narcotic agent status: Secondary | ICD-10-CM

## 2022-12-01 LAB — URINALYSIS, ROUTINE W REFLEX MICROSCOPIC
Bilirubin Urine: NEGATIVE
Glucose, UA: NEGATIVE mg/dL
Hgb urine dipstick: NEGATIVE
Ketones, ur: NEGATIVE mg/dL
Leukocytes,Ua: NEGATIVE
Nitrite: NEGATIVE
Protein, ur: NEGATIVE mg/dL
Specific Gravity, Urine: 1.008 (ref 1.005–1.030)
pH: 8 (ref 5.0–8.0)

## 2022-12-01 LAB — COMPREHENSIVE METABOLIC PANEL
ALT: 159 U/L — ABNORMAL HIGH (ref 0–44)
AST: 258 U/L — ABNORMAL HIGH (ref 15–41)
Albumin: 3.9 g/dL (ref 3.5–5.0)
Alkaline Phosphatase: 67 U/L (ref 38–126)
Anion gap: 8 (ref 5–15)
BUN: 12 mg/dL (ref 6–20)
CO2: 27 mmol/L (ref 22–32)
Calcium: 8.7 mg/dL — ABNORMAL LOW (ref 8.9–10.3)
Chloride: 101 mmol/L (ref 98–111)
Creatinine, Ser: 0.67 mg/dL (ref 0.61–1.24)
GFR, Estimated: >60 mL/min (ref >60)
Glucose, Bld: 99 mg/dL (ref 70–99)
Potassium: 3.6 mmol/L (ref 3.5–5.1)
Sodium: 136 mmol/L (ref 135–145)
Total Bilirubin: 1.2 mg/dL (ref 0.3–1.2)
Total Protein: 7.9 g/dL (ref 6.5–8.1)

## 2022-12-01 LAB — CBC
HCT: 41.1 % (ref 39.0–52.0)
Hemoglobin: 14.1 g/dL (ref 13.0–17.0)
MCH: 30.7 pg (ref 26.0–34.0)
MCHC: 34.3 g/dL (ref 30.0–36.0)
MCV: 89.5 fL (ref 80.0–100.0)
Platelets: 217 10*3/uL (ref 150–400)
RBC: 4.59 MIL/uL (ref 4.22–5.81)
RDW: 13.2 % (ref 11.5–15.5)
WBC: 5 10*3/uL (ref 4.0–10.5)
nRBC: 0 % (ref 0.0–0.2)

## 2022-12-01 LAB — CK: Total CK: 39972 U/L — ABNORMAL HIGH (ref 49–397)

## 2022-12-01 MED ORDER — SODIUM CHLORIDE 0.9 % IV BOLUS
1000.0000 mL | Freq: Once | INTRAVENOUS | Status: AC
  Administered 2022-12-01: 1000 mL via INTRAVENOUS

## 2022-12-01 MED ORDER — HYDROMORPHONE HCL 1 MG/ML IJ SOLN
1.0000 mg | Freq: Once | INTRAMUSCULAR | Status: AC
  Administered 2022-12-01: 1 mg via INTRAVENOUS
  Filled 2022-12-01: qty 1

## 2022-12-01 MED ORDER — OXYCODONE HCL 5 MG PO TABS
15.0000 mg | ORAL_TABLET | ORAL | Status: DC | PRN
  Administered 2022-12-01: 15 mg via ORAL
  Filled 2022-12-01: qty 3

## 2022-12-01 MED ORDER — ONDANSETRON HCL 4 MG/2ML IJ SOLN: 4.0000 mg | Freq: Four times a day (QID) | INTRAMUSCULAR | Status: DC | PRN

## 2022-12-01 MED ORDER — ONDANSETRON HCL 4 MG PO TABS: 4.0000 mg | ORAL_TABLET | Freq: Four times a day (QID) | ORAL | Status: DC | PRN

## 2022-12-01 MED ORDER — OXYCODONE HCL 5 MG PO TABS
20.0000 mg | ORAL_TABLET | ORAL | Status: DC | PRN
  Administered 2022-12-01 – 2022-12-02 (×5): 20 mg via ORAL
  Filled 2022-12-01 (×5): qty 4

## 2022-12-01 MED ORDER — ENOXAPARIN SODIUM 40 MG/0.4ML IJ SOSY
40.0000 mg | PREFILLED_SYRINGE | INTRAMUSCULAR | Status: DC
  Administered 2022-12-01: 40 mg via SUBCUTANEOUS
  Filled 2022-12-01: qty 0.4

## 2022-12-01 MED ORDER — SODIUM CHLORIDE 0.9 % IV SOLN: INTRAVENOUS | Status: DC

## 2022-12-01 NOTE — Assessment & Plan Note (Signed)
Patient presenting with evidence of acute rhabdomyolysis in the setting of exercise. 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  He follows with [facility] neurology CK 39k which looks to be usual baseline assd w/ rhabdomyolysis flares  IVF  Monitor renal function  Oral medication pain control

## 2022-12-01 NOTE — ED Provider Notes (Signed)
[facility] [place] Provider Note    Event Date/Time   First MD Initiated Contact with Patient 12/01/22 [phone]     (approximate)   History   Back Pain   HPI  Roberto Stanton is a 27 y.o. male with history of McArdle disease, pericarditis, multiple episodes of rhabdomyolysis and kidney stones presents emergency department with complaints of muscle pain.  States he went kayaking yesterday and may have overdone it.  States his urine was very dark this morning is concerned he is in rhabdomyolysis again.  Patient is seen at a chronic pain clinic.  Takes oxycodone 10 mg 4 times a day.  Did not take his medication this morning.      Physical Exam   Triage Vital Signs: ED Triage Vitals  Encounter Vitals Group     BP 12/01/22 0815 128/81     Systolic BP Percentile --      Diastolic BP Percentile --      Pulse Rate 12/01/22 0815 (!) 103     Resp 12/01/22 0815 16     Temp 12/01/22 0815 97.6 F (36.4 C)     Temp src --      SpO2 12/01/22 0815 98 %     Weight 12/01/22 0817 140 lb 14 oz (63.9 kg)     Height 12/01/22 0817 5\' 9"  (1.753 m)     Head Circumference --      Peak Flow --      Pain Score 12/01/22 0817 8     Pain Loc --      Pain Education --      Exclude from Growth Chart --     Most recent vital signs: Vitals:   12/01/22 0815  BP: 128/81  Pulse: (!) 103  Resp: 16  Temp: 97.6 F (36.4 C)  SpO2: 98%     General: Awake, no distress.   CV:  Good peripheral perfusion. regular rate and  rhythm Resp:  Normal effort.  Abd:  No distention.   Other:     ED Results / Procedures / Treatments   Labs (all labs ordered are listed, but only abnormal results are displayed) Labs Reviewed  COMPREHENSIVE METABOLIC PANEL - Abnormal; Notable for the following components:      Result Value   Calcium 8.7 (*)    AST 258 (*)    ALT 159 (*)    All other components within normal limits  CK - Abnormal; Notable for the following components:   Total CK 39,972  (*)    All other components within normal limits  URINALYSIS, ROUTINE W REFLEX MICROSCOPIC - Abnormal; Notable for the following components:   Color, Urine STRAW (*)    APPearance CLEAR (*)    All other components within normal limits  CBC     EKG     RADIOLOGY     PROCEDURES:   Procedures   MEDICATIONS ORDERED IN ED: Medications  sodium chloride 0.9 % bolus 1,000 mL (0 mLs Intravenous Stopped 12/01/22 0955)  HYDROmorphone (DILAUDID) injection 1 mg (1 mg Intravenous Given 12/01/22 0853)  sodium chloride 0.9 % bolus 1,000 mL (1,000 mLs Intravenous New Bag/Given 12/01/22 1121)  HYDROmorphone (DILAUDID) injection 1 mg (1 mg Intravenous Given 12/01/22 1139)     IMPRESSION / MDM / ASSESSMENT AND PLAN / ED COURSE  I reviewed the triage vital signs and the nursing notes.  Differential diagnosis includes, but is not limited to, strain, rhabdomyolysis, dehydration, AKI  Patient's presentation is most consistent with acute illness / injury with system symptoms.   Due to the frequent episodes of rhabdomyolysis we will go ahead and order labs including CK, nursing staff to insert IV and start fluids with normal saline 1 L IV.  Patient was given Dilaudid 1 mg IV for pain.   CBC urinalysis reassuring, metabolic panel has elevated AST and ALT but they tend to be in the patient's normal trend.  CK is concerning for rhabdomyolysis, level is at 39,972  Patient was given second liter of normal saline.  Additional Dilaudid 1 mg IV due to pain.  Dr. Anner Crete in to see the patient for admission  Consult to hospitalist for admission  Spoke with Dr. Alvester Morin.  Is admitting the patient.  He is in stable condition at this time   FINAL CLINICAL IMPRESSION(S) / ED DIAGNOSES   Final diagnoses:  Traumatic rhabdomyolysis, initial encounter [facility])     Rx / DC Orders   ED Discharge Orders     None        Note:  This document was prepared using Dragon  voice recognition software and may include unintentional dictation errors.    Faythe Ghee, PA-C 12/01/22 1145    Janith Lima, MD 12/02/22 [phone]

## 2022-12-01 NOTE — Assessment & Plan Note (Signed)
AST 258, ALT 159 Likely secondary to rhabdomyolysis Recurring issue Monitor with hydration

## 2022-12-01 NOTE — Assessment & Plan Note (Signed)
Followed by [facility] neurology Pending genetic testing per patient within the next 1 to 2 weeks

## 2022-12-01 NOTE — H&P (Signed)
History and Physical    Patient: Roberto Stanton:[id number] DOB: July 25, 1995 DOA: 12/01/2022 DOS: the patient was seen and examined on 12/01/2022 PCP: [facility]  Patient coming from: Home  Chief Complaint:  Chief Complaint  Patient presents with   Back Pain   HPI: Roberto Stanton is a 27 y.o. male with medical history significant of McArdle disease, recurrent rhabdomyolysis presenting with rhabdomyolysis.  Patient reports going kayaking yesterday.  Patient states the episode was fairly nonexertional although he did do some degree of paddling.  Patient states he woke up this morning with severe pain.  Had dark-colored urine overnight that he says seem to get better with p.o. fluid intake.  No chest pain.  Positive generalized musculoskeletal pain and soreness.  On oxycodone 10 mg every 6 hours as needed.  No reported illicit drug use.  Denies any tobacco or alcohol use.  Noted multiple admissions for similar issues in the past. Presented to the ER afebrile, hemodynamically stable.  White count 5, hemoglobin 14, platelets 217, creatinine 0.7.  AST 258, ALT 159. Review of Systems: As mentioned in the history of present illness. All other systems reviewed and are negative. Past Medical History:  Diagnosis Date   Kidney stones    McArdle disease (HCC) 09/28/2021   Pericarditis    Rhabdomyolysis    No past surgical history on file. Social History:  reports that he has never smoked. He has never used smokeless tobacco. He reports that he does not drink alcohol and does not use drugs.  Allergies  Allergen Reactions   Morphine Hives    Family History  Problem Relation Age of Onset   Heart disease Father     Prior to Admission medications   Medication Sig Start Date End Date Taking? Authorizing Provider  lidocaine 4 % Place 1 patch onto the skin daily. 07/15/22  Yes [provider]  oxyCODONE-acetaminophen (PERCOCET) 10-325 MG tablet Take 1 tablet by  mouth every 4 (four) hours as needed for pain.   Yes [provider]  oxyCODONE (OXY IR/ROXICODONE) 5 MG immediate release tablet Take 10 mg by mouth every 4 (four) hours as needed for severe pain. Patient not taking: Reported on 12/01/2022    [provider]    Physical Exam: Vitals:   12/01/22 0815 12/01/22 0817  BP: 128/81   Pulse: (!) 103   Resp: 16   Temp: 97.6 F (36.4 C)   SpO2: 98%   Weight:  63.9 kg  Height:  5\' 9"  (1.753 m)   Physical Exam Constitutional:      Appearance: He is normal weight.  HENT:     Head: Normocephalic and atraumatic.     Nose: Nose normal.     Mouth/Throat:     Mouth: Mucous membranes are moist.  Eyes:     Pupils: Pupils are equal, round, and reactive to light.  Cardiovascular:     Rate and Rhythm: Normal rate and regular rhythm.  Pulmonary:     Effort: Pulmonary effort is normal.  Abdominal:     General: Bowel sounds are normal.  Musculoskeletal:        General: Normal range of motion.  Skin:    General: Skin is warm.  Neurological:     General: No focal deficit present.  Psychiatric:        Mood and Affect: Mood normal.     Data Reviewed:  There are no new results to review at this time.  Lab Results  Component Value Date   WBC 5.0 12/01/2022   HGB 14.1 12/01/2022   HCT 41.1 12/01/2022   MCV 89.5 12/01/2022   PLT 217 12/01/2022   Last metabolic panel Lab Results  Component Value Date   GLUCOSE 99 12/01/2022   NA 136 12/01/2022   K 3.6 12/01/2022   CL 101 12/01/2022   CO2 27 12/01/2022   BUN 12 12/01/2022   CREATININE 0.67 12/01/2022   GFRNONAA >60 12/01/2022   CALCIUM 8.7 (L) 12/01/2022   PHOS 3.7 05/02/2022   PROT 7.9 12/01/2022   ALBUMIN 3.9 12/01/2022   BILITOT 1.2 12/01/2022   ALKPHOS 67 12/01/2022   AST 258 (H) 12/01/2022   ALT 159 (H) 12/01/2022   ANIONGAP 8 12/01/2022    Assessment and Plan: * Rhabdomyolysis Patient presenting with evidence of acute rhabdomyolysis in the setting  of exercise. 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  He follows with [facility] neurology CK 39k which looks to be usual baseline assd w/ rhabdomyolysis flares  IVF  Monitor renal function  Oral medication pain control    Myophosphorylase deficiency (glycogen storage disease V, McArdle disease) (HCC) Followed by [facility] neurology Pending genetic testing per patient within the next 1 to 2 weeks  Transaminitis AST 258, ALT 159 Likely secondary to rhabdomyolysis Recurring issue Monitor with hydration    Greater than 50% was spent in counseling and coordination of care with patient Total encounter time 60 minutes or more   Advance Care Planning:   Code Status: Prior   Consults: None  Family Communication: No family at the bedside  Severity of Illness: The appropriate patient status for this patient is OBSERVATION. Observation status is judged to be reasonable and necessary in order to provide the required intensity of service to ensure the patient's safety. The patient's presenting symptoms, physical exam findings, and initial radiographic and laboratory data in the context of their medical condition is felt to place them at decreased risk for further clinical deterioration. Furthermore, it is anticipated that the patient will be medically stable for discharge from the hospital within 2 midnights of admission.   Author: Floydene Flock, MD 12/01/2022 12:46 PM  For on call review [email or web address].

## 2022-12-01 NOTE — Plan of Care (Signed)
Problem: Health Behavior/Discharge Planning: Goal: Ability to manage health-related needs will improve Outcome: Progressing   Problem: Education: Goal: Knowledge of General Education information will improve Description Including pain rating scale, medication(s)/side effects and non-pharmacologic comfort measures Outcome: Progressing   Problem: Pain Managment: Goal: General experience of comfort will improve Outcome: Progressing

## 2022-12-01 NOTE — ED Triage Notes (Signed)
Pt here via [facility] with lower back pain. Pt states the pain radiates to his legs. Pt states he was kayaking yesterday and was drinking a lot of water but thinks that  that may have been too much for his body. Pt states his urine is also dark in color. Pt has a hx of pericarditis and McArdell's disease.   121/84 99% 96 97.7 8/10 pain

## 2022-12-02 ENCOUNTER — Encounter: Payer: Self-pay | Admitting: Internal Medicine

## 2022-12-02 DIAGNOSIS — M6282 Rhabdomyolysis: Secondary | ICD-10-CM | POA: Diagnosis not present

## 2022-12-02 LAB — CBC
HCT: 35.9 % — ABNORMAL LOW (ref 39.0–52.0)
Hemoglobin: 12.6 g/dL — ABNORMAL LOW (ref 13.0–17.0)
MCH: 31.1 pg (ref 26.0–34.0)
MCHC: 35.1 g/dL (ref 30.0–36.0)
MCV: 88.6 fL (ref 80.0–100.0)
Platelets: 194 10*3/uL (ref 150–400)
RBC: 4.05 MIL/uL — ABNORMAL LOW (ref 4.22–5.81)
RDW: 13.2 % (ref 11.5–15.5)
WBC: 4.9 10*3/uL (ref 4.0–10.5)
nRBC: 0 % (ref 0.0–0.2)

## 2022-12-02 LAB — COMPREHENSIVE METABOLIC PANEL
ALT: 133 U/L — ABNORMAL HIGH (ref 0–44)
AST: 167 U/L — ABNORMAL HIGH (ref 15–41)
Albumin: 3.5 g/dL (ref 3.5–5.0)
Alkaline Phosphatase: 61 U/L (ref 38–126)
Anion gap: 8 (ref 5–15)
BUN: <5 mg/dL — ABNORMAL LOW (ref 6–20)
CO2: 27 mmol/L (ref 22–32)
Calcium: 8.5 mg/dL — ABNORMAL LOW (ref 8.9–10.3)
Chloride: 104 mmol/L (ref 98–111)
Creatinine, Ser: 0.61 mg/dL (ref 0.61–1.24)
GFR, Estimated: >60 mL/min (ref >60)
Glucose, Bld: 92 mg/dL (ref 70–99)
Potassium: 3.5 mmol/L (ref 3.5–5.1)
Sodium: 139 mmol/L (ref 135–145)
Total Bilirubin: 1 mg/dL (ref 0.3–1.2)
Total Protein: 6.7 g/dL (ref 6.5–8.1)

## 2022-12-02 LAB — CK: Total CK: 18733 U/L — ABNORMAL HIGH (ref 49–397)

## 2022-12-02 LAB — HIV ANTIBODY (ROUTINE TESTING W REFLEX): HIV Screen 4th Generation wRfx: NONREACTIVE

## 2022-12-02 MED ORDER — OXYCODONE HCL 10 MG PO TABS: 10.0000 mg | ORAL_TABLET | Freq: Four times a day (QID) | ORAL | 0 refills | Status: DC | PRN

## 2022-12-02 NOTE — Plan of Care (Signed)

## 2022-12-02 NOTE — Discharge Summary (Signed)
Physician Discharge Summary   Patient: Roberto Stanton MRN: [id number] DOB: [birth year]  Admit date:     12/01/2022  Discharge date: 12/02/22  Discharge Physician: Lurene Shadow   PCP: [facility]   Recommendations at discharge:   Follow-up with PCP in 1 week  Discharge Diagnoses: Principal Problem:   Rhabdomyolysis Active Problems:   Transaminitis   Myophosphorylase deficiency (glycogen storage disease V, McArdle disease) (HCC)  Resolved Problems:   * No resolved hospital problems. [facility] Course:   [name] is a 27 year old man with medical history significant for McArdle's disease, recurrent rhabdomyolysis, pericarditis, nephrolithiasis, who presented to the hospital because of back pain.  He said he did some kayaking the day prior to admission.  He said the kayaking was mostly downstream but there were times where he got "stuck" and have to push a little harder.  He thinks this may have triggered his rhabdomyolysis.   He was admitted to the hospital for rhabdomyolysis.  CK level was 39,972.  He was treated with IV fluids and analgesics.  Elevated liver enzymes are probably due to rhabdomyolysis. He is feeling better and wants to be discharged home today.  He said he can manage this with adequate hydration and analgesics at home.  Chart review shows that he was recently admitted to [facility] with CK level of 57,609 on 10/21/2022.  He was discharged home on 10/26/2022 with CK level of 20,140.  He follows up with [facility] neurology for his McArdle's disease.     Pain control - [employer] Controlled Substance Reporting System database was reviewed. and patient was instructed, not to drive, operate heavy machinery, perform activities at heights, swimming or participation in water activities or provide baby-sitting services while on Pain, Sleep and Anxiety Medications; until their outpatient Physician has advised to do so again. Also  recommended to not to take more than prescribed Pain, Sleep and Anxiety Medications.  Consultants: None Procedures performed: None  Disposition: Home Diet recommendation:  Discharge Diet Orders (From admission, onward)     Start     Ordered   12/02/22 0000  Diet - low sodium heart healthy        12/02/22 1028           Cardiac diet DISCHARGE MEDICATION: Allergies as of 12/02/2022       Reactions   Morphine Hives        Medication List     STOP taking these medications    oxyCODONE-acetaminophen 10-325 MG tablet Commonly known as: PERCOCET       TAKE these medications    lidocaine 4 % Place 1 patch onto the skin daily.   Oxycodone HCl 10 MG Tabs Take 1 tablet (10 mg total) by mouth every 6 (six) hours as needed for severe pain. What changed:  medication strength when to take this        Discharge Exam: Filed Weights   12/01/22 0817  Weight: 63.9 kg   GEN: NAD SKIN: No rash EYES: EOMI ENT: MMM CV: RRR PULM: CTA B ABD: soft, ND, NT, +BS CNS: AAO x 3, non focal EXT: No edema or tenderness   Condition at discharge: good  The results of significant diagnostics from this hospitalization (including imaging, microbiology, ancillary and laboratory) are listed below for reference.   Imaging Studies: No results found.  Microbiology: Results for orders placed or performed during the hospital encounter of 04/26/22  Resp panel by RT-PCR (RSV, Flu A&B,  Covid) Anterior Nasal Swab     Status: None   Collection Time: 04/26/22  6:21 PM   Specimen: Anterior Nasal Swab  Result Value Ref Range Status   SARS Coronavirus 2 by RT PCR NEGATIVE NEGATIVE Final    Comment: (NOTE) SARS-CoV-2 target nucleic acids are NOT DETECTED.  The SARS-CoV-2 RNA is generally detectable in upper respiratory specimens during the acute phase of infection. The lowest concentration of SARS-CoV-2 viral copies this assay can detect is 138 copies/mL. A negative result does not  preclude SARS-Cov-2 infection and should not be used as the sole basis for treatment or other patient management decisions. A negative result may occur with  improper specimen collection/handling, submission of specimen other than nasopharyngeal swab, presence of viral mutation(s) within the areas targeted by this assay, and inadequate number of viral copies(<138 copies/mL). A negative result must be combined with clinical observations, patient history, and epidemiological information. The expected result is Negative.  Fact Sheet for Patients:  BloggerCourse.com  Fact Sheet for Healthcare Providers:  SeriousBroker.it  This test is no t yet approved or cleared by the [place] FDA and  has been authorized for detection and/or diagnosis of SARS-CoV-2 by FDA under an Emergency Use Authorization (EUA). This EUA will remain  in effect (meaning this test can be used) for the duration of the COVID-19 declaration under Section 564(b)(1) of the Act, 21 U.S.C.section 360bbb-3(b)(1), unless the authorization is terminated  or revoked sooner.       Influenza A by PCR NEGATIVE NEGATIVE Final   Influenza B by PCR NEGATIVE NEGATIVE Final    Comment: (NOTE) The Xpert Xpress SARS-CoV-2/FLU/RSV plus assay is intended as an aid in the diagnosis of influenza from Nasopharyngeal swab specimens and should not be used as a sole basis for treatment. Nasal washings and aspirates are unacceptable for Xpert Xpress SARS-CoV-2/FLU/RSV testing.  Fact Sheet for Patients: BloggerCourse.com  Fact Sheet for Healthcare Providers: SeriousBroker.it  This test is not yet approved or cleared by the [place] FDA and has been authorized for detection and/or diagnosis of SARS-CoV-2 by FDA under an Emergency Use Authorization (EUA). This EUA will remain in effect (meaning this test can be used) for the  duration of the COVID-19 declaration under Section 564(b)(1) of the Act, 21 U.S.C. section 360bbb-3(b)(1), unless the authorization is terminated or revoked.     Resp Syncytial Virus by PCR NEGATIVE NEGATIVE Final    Comment: (NOTE) Fact Sheet for Patients: BloggerCourse.com  Fact Sheet for Healthcare Providers: SeriousBroker.it  This test is not yet approved or cleared by the [place] FDA and has been authorized for detection and/or diagnosis of SARS-CoV-2 by FDA under an Emergency Use Authorization (EUA). This EUA will remain in effect (meaning this test can be used) for the duration of the COVID-19 declaration under Section 564(b)(1) of the Act, 21 U.S.C. section 360bbb-3(b)(1), unless the authorization is terminated or revoked.  Performed at [facility], [address] [place], [place] [address]     Labs: CBC: Recent Labs  Lab 12/01/22 0819 12/02/22 0559  WBC 5.0 4.9  HGB 14.1 12.6*  HCT 41.1 35.9*  MCV 89.5 88.6  PLT 217 194   Basic Metabolic Panel: Recent Labs  Lab 12/01/22 0819 12/02/22 0559  NA 136 139  K 3.6 3.5  CL 101 104  CO2 27 27  GLUCOSE 99 92  BUN 12 <5*  CREATININE 0.67 0.61  CALCIUM 8.7* 8.5*   Liver Function Tests: Recent Labs  Lab 12/01/22  1610 12/02/22 0559  AST 258* 167*  ALT 159* 133*  ALKPHOS 67 61  BILITOT 1.2 1.0  PROT 7.9 6.7  ALBUMIN 3.9 3.5   CBG: No results for input(s): "GLUCAP" in the last 168 hours.  Discharge time spent: greater than 30 minutes.  Signed: Lurene Shadow, MD [facility] 12/02/2022

## 2022-12-02 NOTE — Progress Notes (Signed)
Transition of Care [facility]) - Inpatient Brief Assessment   Patient Details  Name: Roberto Stanton MRN: [id number] Date of Birth: 05-03-1995  Transition of [facility]) CM/SW Contact:    Bing Quarry, RN Phone Number: 12/02/2022, 11:13 AM   Clinical Narrative: 10/13: Admitted 10/12 via EMS w c/o lower back pain with dark colored urine. Admitting dx of Rhabdomyolysis after kayaking . DC today to home self care.  Has PCP: [facility]. [phone]. To follow up post discharge. Insurance: Medicaid, [place].  No TOC consut, no SDOH flags noted.   Gabriel Cirri MSN RN CM  Transitions of Care Department [facility] [phone] Weekends Only     Transition of Care Asessment: Insurance and Status: Insurance coverage has been reviewed Patient has primary care physician: Yes Home environment has been reviewed: From home Prior level of function:: Ind Prior/Current Home Services: No current home services Social Determinants of Health Reivew: SDOH reviewed no interventions necessary Readmission risk has been reviewed: Yes Transition of care needs: no transition of care needs at this time

## 2022-12-12 ENCOUNTER — Emergency Department: Payer: Medicaid Other

## 2022-12-12 ENCOUNTER — Other Ambulatory Visit: Payer: Self-pay

## 2022-12-12 DIAGNOSIS — Z8249 Family history of ischemic heart disease and other diseases of the circulatory system: Secondary | ICD-10-CM

## 2022-12-12 DIAGNOSIS — Z1152 Encounter for screening for COVID-19: Secondary | ICD-10-CM

## 2022-12-12 DIAGNOSIS — M6282 Rhabdomyolysis: Principal | ICD-10-CM | POA: Diagnosis present

## 2022-12-12 DIAGNOSIS — Z79899 Other long term (current) drug therapy: Secondary | ICD-10-CM

## 2022-12-12 DIAGNOSIS — Z885 Allergy status to narcotic agent status: Secondary | ICD-10-CM

## 2022-12-12 DIAGNOSIS — Z87442 Personal history of urinary calculi: Secondary | ICD-10-CM

## 2022-12-12 DIAGNOSIS — E7404 McArdle disease: Secondary | ICD-10-CM | POA: Diagnosis present

## 2022-12-12 LAB — CBC
HCT: 41.7 % (ref 39.0–52.0)
Hemoglobin: 14.5 g/dL (ref 13.0–17.0)
MCH: 30.8 pg (ref 26.0–34.0)
MCHC: 34.8 g/dL (ref 30.0–36.0)
MCV: 88.5 fL (ref 80.0–100.0)
Platelets: 237 10*3/uL (ref 150–400)
RBC: 4.71 MIL/uL (ref 4.22–5.81)
RDW: 13.1 % (ref 11.5–15.5)
WBC: 7 10*3/uL (ref 4.0–10.5)
nRBC: 0 % (ref 0.0–0.2)

## 2022-12-12 NOTE — ED Triage Notes (Signed)
Pt states over the past 2 days he has had left sided chest pain, dizziness and shortness of breath. Hx Mcardel disease.

## 2022-12-13 ENCOUNTER — Encounter: Payer: Self-pay | Admitting: Internal Medicine

## 2022-12-13 ENCOUNTER — Inpatient Hospital Stay
Admission: EM | Admit: 2022-12-13 | Discharge: 2022-12-15 | DRG: 558 | Disposition: A | Discharge status: Home / Self Care | Payer: Medicaid Other | Attending: Internal Medicine | Admitting: Internal Medicine

## 2022-12-13 DIAGNOSIS — Z885 Allergy status to narcotic agent status: Secondary | ICD-10-CM | POA: Diagnosis not present

## 2022-12-13 DIAGNOSIS — E7404 McArdle disease: Secondary | ICD-10-CM | POA: Diagnosis present

## 2022-12-13 DIAGNOSIS — Z87442 Personal history of urinary calculi: Secondary | ICD-10-CM | POA: Diagnosis not present

## 2022-12-13 DIAGNOSIS — R252 Cramp and spasm: Secondary | ICD-10-CM

## 2022-12-13 DIAGNOSIS — Z1152 Encounter for screening for COVID-19: Secondary | ICD-10-CM | POA: Diagnosis not present

## 2022-12-13 DIAGNOSIS — T796XXA Traumatic ischemia of muscle, initial encounter: Secondary | ICD-10-CM | POA: Diagnosis not present

## 2022-12-13 DIAGNOSIS — Z79899 Other long term (current) drug therapy: Secondary | ICD-10-CM | POA: Diagnosis not present

## 2022-12-13 DIAGNOSIS — Z8249 Family history of ischemic heart disease and other diseases of the circulatory system: Secondary | ICD-10-CM | POA: Diagnosis not present

## 2022-12-13 DIAGNOSIS — M6282 Rhabdomyolysis: Secondary | ICD-10-CM | POA: Diagnosis present

## 2022-12-13 LAB — CK
Total CK: 5837 U/L — ABNORMAL HIGH (ref 49–397)
Total CK: 11656 U/L — ABNORMAL HIGH (ref 49–397)

## 2022-12-13 LAB — URINALYSIS, ROUTINE W REFLEX MICROSCOPIC
Bacteria, UA: NONE SEEN
Bilirubin Urine: NEGATIVE
Glucose, UA: NEGATIVE mg/dL
Ketones, ur: NEGATIVE mg/dL
Leukocytes,Ua: NEGATIVE
Nitrite: NEGATIVE
Protein, ur: NEGATIVE mg/dL
Specific Gravity, Urine: 1.006 (ref 1.005–1.030)
Squamous Epithelial / HPF: 0 /[HPF] (ref 0–5)
pH: 6 (ref 5.0–8.0)

## 2022-12-13 LAB — BASIC METABOLIC PANEL
Anion gap: 9 (ref 5–15)
BUN: 14 mg/dL (ref 6–20)
CO2: 25 mmol/L (ref 22–32)
Calcium: 9.2 mg/dL (ref 8.9–10.3)
Chloride: 102 mmol/L (ref 98–111)
Creatinine, Ser: 0.64 mg/dL (ref 0.61–1.24)
GFR, Estimated: >60 mL/min (ref >60)
Glucose, Bld: 89 mg/dL (ref 70–99)
Potassium: 3.5 mmol/L (ref 3.5–5.1)
Sodium: 136 mmol/L (ref 135–145)

## 2022-12-13 LAB — HEPATIC FUNCTION PANEL
ALT: 68 U/L — ABNORMAL HIGH (ref 0–44)
AST: 95 U/L — ABNORMAL HIGH (ref 15–41)
Albumin: 3.9 g/dL (ref 3.5–5.0)
Alkaline Phosphatase: 72 U/L (ref 38–126)
Bilirubin, Direct: 0.1 mg/dL (ref 0.0–0.2)
Indirect Bilirubin: 0.8 mg/dL (ref 0.3–0.9)
Total Bilirubin: 0.9 mg/dL (ref 0.3–1.2)
Total Protein: 7.5 g/dL (ref 6.5–8.1)

## 2022-12-13 LAB — TROPONIN I (HIGH SENSITIVITY)
Troponin I (High Sensitivity): 5 ng/L (ref <18)
Troponin I (High Sensitivity): 6 ng/L (ref <18)

## 2022-12-13 LAB — LIPASE, BLOOD: Lipase: 21 U/L (ref 11–51)

## 2022-12-13 MED ORDER — ENOXAPARIN SODIUM 40 MG/0.4ML IJ SOSY: 40.0000 mg | PREFILLED_SYRINGE | INTRAMUSCULAR | Status: DC

## 2022-12-13 MED ORDER — ONDANSETRON HCL 4 MG PO TABS: 4.0000 mg | ORAL_TABLET | Freq: Four times a day (QID) | ORAL | Status: DC | PRN

## 2022-12-13 MED ORDER — SODIUM CHLORIDE 0.9 % IV BOLUS
1000.0000 mL | Freq: Once | INTRAVENOUS | Status: AC
Start: 2022-12-13 — End: 2022-12-13
  Administered 2022-12-13: 1000 mL via INTRAVENOUS

## 2022-12-13 MED ORDER — ACETAMINOPHEN 650 MG RE SUPP: 650.0000 mg | Freq: Four times a day (QID) | RECTAL | Status: DC | PRN

## 2022-12-13 MED ORDER — HYDROCODONE-ACETAMINOPHEN 5-325 MG PO TABS: 1.0000 | ORAL_TABLET | ORAL | Status: DC | PRN

## 2022-12-13 MED ORDER — MORPHINE SULFATE (PF) 2 MG/ML IV SOLN: 2.0000 mg | INTRAVENOUS | Status: DC | PRN

## 2022-12-13 MED ORDER — HYDROMORPHONE HCL 1 MG/ML IJ SOLN
1.0000 mg | Freq: Once | INTRAMUSCULAR | Status: AC
  Administered 2022-12-13: 1 mg via INTRAVENOUS
  Filled 2022-12-13: qty 1

## 2022-12-13 MED ORDER — HYDROMORPHONE HCL 1 MG/ML IJ SOLN
1.0000 mg | INTRAMUSCULAR | Status: DC | PRN
  Administered 2022-12-13 – 2022-12-15 (×10): 1 mg via INTRAVENOUS
  Filled 2022-12-13 (×10): qty 1

## 2022-12-13 MED ORDER — SODIUM CHLORIDE 0.9 % IV BOLUS
1000.0000 mL | Freq: Once | INTRAVENOUS | Status: AC
  Administered 2022-12-13: 1000 mL via INTRAVENOUS

## 2022-12-13 MED ORDER — ENOXAPARIN SODIUM 40 MG/0.4ML IJ SOSY
40.0000 mg | PREFILLED_SYRINGE | INTRAMUSCULAR | Status: DC
  Filled 2022-12-13 (×3): qty 0.4

## 2022-12-13 MED ORDER — ONDANSETRON HCL 4 MG/2ML IJ SOLN
4.0000 mg | Freq: Four times a day (QID) | INTRAMUSCULAR | Status: DC | PRN
  Administered 2022-12-13 (×2): 4 mg via INTRAVENOUS
  Filled 2022-12-13 (×2): qty 2

## 2022-12-13 MED ORDER — OXYCODONE HCL 5 MG PO TABS
10.0000 mg | ORAL_TABLET | Freq: Once | ORAL | Status: AC
  Administered 2022-12-13: 10 mg via ORAL
  Filled 2022-12-13: qty 2

## 2022-12-13 MED ORDER — OXYCODONE HCL 5 MG PO TABS
20.0000 mg | ORAL_TABLET | ORAL | Status: DC | PRN
  Administered 2022-12-13 – 2022-12-15 (×12): 20 mg via ORAL
  Filled 2022-12-13 (×12): qty 4

## 2022-12-13 MED ORDER — OXYCODONE HCL 5 MG PO TABS: 20.0000 mg | ORAL_TABLET | ORAL | Status: DC | PRN

## 2022-12-13 MED ORDER — ACETAMINOPHEN 325 MG PO TABS: 650.0000 mg | ORAL_TABLET | Freq: Four times a day (QID) | ORAL | Status: DC | PRN

## 2022-12-13 MED ORDER — SODIUM CHLORIDE 0.9 % IV BOLUS
250.0000 mL | Freq: Once | INTRAVENOUS | Status: AC
  Administered 2022-12-13: 250 mL via INTRAVENOUS

## 2022-12-13 NOTE — H&P (Addendum)
History and Physical    Patient: Roberto Stanton EXB:[id number] DOB: 05-12-1995 DOA: 12/13/2022 DOS: the patient was seen and examined on 12/13/2022 PCP: [facility]  Patient coming from: Home  Chief Complaint: Generalized body pain Chief Complaint  Patient presents with   Chest Pain   HPI: Roberto Stanton is a 27 y.o. male with medical history significant of McArdle disease, kidney stones and recent admission for acute rhabdomyolysis who was discharged on 12/01/2021 comes in again with generalized body pain.  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  According to him he has followed up with [facility] genetics testing and he sees a neurologist at [facility] for his McArdle disease.  Upon arrival to the emergency room CPK was 5837 and patient still had concerns of worsening pain and CPK was subsequently repeated and found to be 11 656.  Given worsening rhabdomyolysis hospitalist service was therefore contacted to admit patient for further management. Patient denies nausea vomiting abdominal pain chest pain cough headache or urinary complaints Review of Systems: As mentioned in the history of present illness. All other systems reviewed and are negative. Past Medical History:  Diagnosis Date   Kidney stones    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 No pertinent surgical history. Social History:  reports that he has never smoked. He has never used smokeless tobacco. He reports that he does not drink alcohol and does not use drugs.  Allergies  Allergen Reactions   Morphine Hives    Family History  Problem Relation Age of Onset   Heart disease Father     Prior to Admission medications   Medication Sig Start Date End Date Taking? Authorizing Provider  lidocaine 4 % Place 1 patch onto  the skin daily. 07/15/22   [provider]  Oxycodone HCl 10 MG TABS Take 1 tablet (10 mg total) by mouth every 6 (six) hours as needed. 12/02/22   Lurene Shadow, MD    Physical Exam: Vitals:   12/12/22 2326 12/13/22 0330 12/13/22 0400 12/13/22 0730  BP: (!) 147/100 119/88 112/77 111/64  Pulse: (!) 101 87 82 76  Resp: 18 14 15 20   Temp: 98.4 F (36.9 C)   98 F (36.7 C)  TempSrc: Oral   Oral  SpO2: 100% 100% 97% 97%  Weight:      Height:       General: Young male laying in bed in no acute distress CVS: S1-S2 present no murmur heard Abdomen: Moves with respiration nontender no masses palpable Respiratory: Clear to auscultation bilaterally CNS: Alert and oriented x 3 moving all extremities nonfocal Musculoskeletal: No abnormality detected Psychiatric: Normal mood  Data Reviewed: I have reviewed patient's previous admission documentation, nursing documentation, ED physician documentation, cross coverage note as well as CBC CMP as well as CPK levels and vitals as stated I have also personally reviewed patient's chest x-ray obtained on admission that did not show any acute intrapulmonary pathology     Latest Ref Rng & Units 12/12/2022   11:27 PM 12/02/2022    5:59 AM 12/01/2022    8:19 AM  CBC  WBC 4.0 - 10.5 K/uL 7.0  4.9  5.0   Hemoglobin 13.0 - 17.0 g/dL 10.2  72.5  36.6   Hematocrit 39.0 - 52.0 % 41.7  35.9  41.1   Platelets 150 - 400 K/uL 237  194  217        Latest Ref Rng & Units 12/12/2022   11:27 PM 12/02/2022    5:59 AM 12/01/2022    8:19 AM  BMP  Glucose 70 - 99 mg/dL 89  92  99   BUN 6 - 20 mg/dL 14  <5  12   Creatinine 0.61 - 1.24 mg/dL 4.03  4.74  2.59   Sodium 135 - 145 mmol/L 136  139  136   Potassium 3.5 - 5.1 mmol/L 3.5  3.5  3.6   Chloride 98 - 111 mmol/L 102  104  101   CO2 22 - 32 mmol/L 25  27  27    Calcium 8.9 - 10.3 mg/dL 9.2  8.5  8.7     Assessment and Plan:  Acute rhabdomyolysis Patient with underlying McArdle's disease  presenting with generalized body pains Continue IV fluid; we will use boluses at this time given IV fluid shortage nationally Continue as needed pain management Monitor CPK levels closely Monitor renal function I have discussed the case with cross coverage physician as well as ED physician  McArdle's disease Patient follows up at [facility] neurology Outpatient follow-up at discharge Monitor closely  DVT prophylaxis: On subcutaneous enoxaparin   Advance Care Planning:   Code Status: Full Code full code  Consults: None  Family Communication: No family at bedside  Severity of Illness: The appropriate patient status for this patient is OBSERVATION. Observation status is judged to be reasonable and necessary in order to provide the required intensity of service to ensure the patient's safety. The patient's presenting symptoms, physical exam findings, and initial radiographic and laboratory data in the context of their medical condition is felt to place them at decreased risk for further clinical deterioration. Furthermore, it is anticipated that the patient will be medically stable for discharge from the hospital within 2 midnights of admission.   Author: Loyce Dys, MD 12/13/2022 7:48 AM  For on call review [email or web address].

## 2022-12-13 NOTE — ED Notes (Signed)
Pt refusing to keep on monitoring equipment, reports "its aggravating"

## 2022-12-13 NOTE — ED Notes (Signed)
Inpatient unit ready to receive patient. Patient being transported by wheelchair to room by [place], NT.

## 2022-12-13 NOTE — ED Notes (Signed)
Patient is refusing Lovenox injection at this time. Meriam Sprague, MD, made aware.

## 2022-12-13 NOTE — ED Provider Notes (Signed)
[facility] Provider Note    Event Date/Time   First MD Initiated Contact with Patient 12/13/22 0214     (approximate)   History   Chest Pain   HPI  Roberto Stanton is a 27 y.o. male who presents to the ED from home with a chief complaint of arms and leg pain.  History of McArdle disease.  Recent hospitalization 10/12 - 12/02/2022 for rhabdomyolysis incurred while kayaking.  Admitted with CK of nearly 40,000, discharged with CK 18,000.  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  Triage nurse noted chest pain and shortness of breath, patient denies this to me.  Does state he feels like his CK might be rising since he came to the emergency department.  Denies fever/chills, abdominal pain, nausea, vomiting or dizziness.     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 No pertinent surgical history.   Home Medications   Prior to Admission medications   Medication Sig Start Date End Date Taking? Authorizing Provider  lidocaine 4 % Place 1 patch onto the skin daily. 07/15/22   [provider]  Oxycodone HCl 10 MG TABS Take 1 tablet (10 mg total) by mouth every 6 (six) hours as needed. 12/02/22   Lurene Shadow, MD      Allergies  Morphine   Family History   Family History  Problem Relation Age of Onset   Heart disease Father      Physical Exam  Triage Vital Signs: ED Triage Vitals  Encounter Vitals Group     BP 12/12/22 2326 (!) 147/100     Systolic BP Percentile --      Diastolic BP Percentile --      Pulse Rate 12/12/22 2326 (!) 101     Resp 12/12/22 2326 18     Temp 12/12/22 2326 98.4 F (36.9 C)     Temp Source 12/12/22 2326 Oral     SpO2 12/12/22 2326 100 %     Weight 12/12/22 2325 140 lb (63.5 kg)     Height 12/12/22 2325 5\' 9"  (1.753 m)     Head Circumference --      Peak Flow --      Pain Score 12/12/22 2325 8     Pain Loc --      Pain Education --      Exclude from Growth Chart --     Updated Vital Signs: BP 119/88   Pulse 87   Temp 98.4 F (36.9 C) (Oral)   Resp 14   Ht 5\' 9"  (1.753 m)   Wt 63.5 kg   SpO2 100%   BMI 20.67 kg/m    General: Awake, mild distress.  CV:  RRR.  Good peripheral perfusion.  Resp:  Normal effort.  CTAB. Abd:  Nontender.  No distention.  Other:  Bilateral calves are supple.   ED Results / Procedures / Treatments  Labs (all labs ordered are listed, but only abnormal results are displayed) Labs Reviewed  CK - Abnormal; Notable for the following components:      Result Value   Total CK 5,837 (*)    All other components within normal limits  URINALYSIS, ROUTINE W REFLEX MICROSCOPIC - Abnormal; Notable for the following components:   Color, Urine STRAW (*)    APPearance CLEAR (*)    Hgb urine dipstick LARGE (*)    All other components within normal limits  CK - Abnormal; Notable for the following components:   Total CK 11,656 (*)    All other components within normal limits  HEPATIC FUNCTION PANEL - Abnormal; Notable for the following components:   AST 95 (*)    ALT 68 (*)    All other components within normal limits  BASIC METABOLIC PANEL  CBC  LIPASE, BLOOD  TROPONIN I (HIGH SENSITIVITY)  TROPONIN I (HIGH  SENSITIVITY)     EKG  ED ECG REPORT I, Waynesha Rammel J, the attending physician, personally viewed and interpreted this ECG.   Date: 12/13/2022  EKG Time: 2330  Rate: 94  Rhythm: normal sinus rhythm  Axis: Normal  Intervals:none  ST&T Change: Nonspecific    RADIOLOGY I have independently visualized and interpreted patient's x-ray as well as noted the radiology interpretation:  CXR: No acute cardiopulmonary process  Official radiology report(s): DG Chest 2 View  Result Date: 12/13/2022 CLINICAL DATA:  Left-sided chest pain, dizziness, and shortness of breath for 2 days. EXAM: CHEST - 2 VIEW COMPARISON:  04/26/2022 FINDINGS: The heart size and mediastinal contours are within normal limits. Both lungs are clear. The visualized skeletal structures are unremarkable. IMPRESSION: No active cardiopulmonary disease. Electronically Signed   By: Burman Nieves M.D.   On: 12/13/2022 00:22     PROCEDURES:  Critical Care performed: Yes, see critical care procedure note(s)  CRITICAL CARE Performed by: Irean Hong   Total critical care time: 30 minutes  Critical care time was exclusive of separately billable procedures and treating other patients.  Critical care was necessary to treat or prevent imminent or life-threatening deterioration.  Critical care was time spent personally by me on the following activities: development of treatment plan with patient and/or surrogate as well as nursing, discussions with consultants, evaluation of patient's response to treatment, examination of patient, obtaining history from patient or surrogate, ordering and performing treatments and interventions, ordering and review of laboratory studies, ordering and review of radiographic studies, pulse oximetry and re-evaluation of patient's condition.   Marland Kitchen1-3 Lead EKG Interpretation  Performed by: Irean Hong, MD Authorized by: Irean Hong, MD     Interpretation: normal     ECG rate:  90   ECG rate  assessment: normal     Rhythm: sinus rhythm     Ectopy: none     Conduction: normal   Comments:     Patient placed on cardiac monitor to evaluate for arrhythmias    MEDICATIONS ORDERED IN ED: Medications  sodium chloride 0.9 % bolus 1,000 mL (has no administration in time range)  oxyCODONE (Oxy IR/ROXICODONE) immediate release tablet 10 mg (has no administration in time range)  sodium chloride 0.9 % bolus 1,000 mL (0 mLs Intravenous Stopped 12/13/22 0333)  HYDROmorphone (DILAUDID) injection 1 mg (1 mg Intravenous Given 12/13/22 0243)  HYDROmorphone (  DILAUDID) injection 1 mg (1 mg Intravenous Given 12/13/22 0331)     IMPRESSION / MDM / ASSESSMENT AND PLAN / ED COURSE  I reviewed the triage vital signs and the nursing notes.                             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  Differential diagnosis includes but is not limited to rhabdomyolysis, electrolyte abnormality, ACS, etc.  I have personally reviewed patient's records and note his recent hospitalization from 10/12 - 12/02/2022 for rhabdomyolysis.  Patient's presentation is most consistent with acute presentation with potential threat to life or bodily function.  The patient is on the cardiac monitor to evaluate for evidence of arrhythmia and/or significant heart rate changes.  Laboratory results unremarkable other than elevated CK of 5837 which is decreased from hospital discharge 2 weeks ago.  Will administer IV fluids, IV Dilaudid for pain.  Patient declines benzodiazepines for muscle spasms.  Will recheck CK, check LFTs as these have been abnormal in the past and likely related to rhabdomyolysis, check UA.  Will reassess.  Clinical Course as of 12/13/22 0441  Thu Dec 13, 2022  0436 CK nearly doubled; will administer additional fluids, order patient's home oxycodone and consult hospitalist for evaluation and admission. [JS]     Clinical Course User Index [JS] Irean Hong, MD     FINAL CLINICAL IMPRESSION(S) / ED DIAGNOSES   Final diagnoses:  McArdle's disease (HCC)  Muscle cramps  Traumatic rhabdomyolysis, initial encounter [facility])     Rx / DC Orders   ED Discharge Orders     None        Note:  This document was prepared using Dragon voice recognition software and may include unintentional dictation errors.   Irean Hong, MD 12/13/22 [phone]

## 2022-12-14 DIAGNOSIS — R252 Cramp and spasm: Secondary | ICD-10-CM | POA: Diagnosis not present

## 2022-12-14 DIAGNOSIS — T796XXA Traumatic ischemia of muscle, initial encounter: Secondary | ICD-10-CM | POA: Diagnosis not present

## 2022-12-14 LAB — CBC
HCT: 38.2 % — ABNORMAL LOW (ref 39.0–52.0)
Hemoglobin: 13.4 g/dL (ref 13.0–17.0)
MCH: 30.8 pg (ref 26.0–34.0)
MCHC: 35.1 g/dL (ref 30.0–36.0)
MCV: 87.8 fL (ref 80.0–100.0)
Platelets: 196 10*3/uL (ref 150–400)
RBC: 4.35 MIL/uL (ref 4.22–5.81)
RDW: 13 % (ref 11.5–15.5)
WBC: 4.9 10*3/uL (ref 4.0–10.5)
nRBC: 0 % (ref 0.0–0.2)

## 2022-12-14 LAB — BASIC METABOLIC PANEL
Anion gap: 8 (ref 5–15)
BUN: 8 mg/dL (ref 6–20)
CO2: 26 mmol/L (ref 22–32)
Calcium: 8.7 mg/dL — ABNORMAL LOW (ref 8.9–10.3)
Chloride: 102 mmol/L (ref 98–111)
Creatinine, Ser: 0.65 mg/dL (ref 0.61–1.24)
GFR, Estimated: >60 mL/min (ref >60)
Glucose, Bld: 94 mg/dL (ref 70–99)
Potassium: 3.5 mmol/L (ref 3.5–5.1)
Sodium: 136 mmol/L (ref 135–145)

## 2022-12-14 LAB — CK: Total CK: 21774 U/L — ABNORMAL HIGH (ref 49–397)

## 2022-12-14 MED ORDER — SODIUM CHLORIDE 0.9 % IV BOLUS
1000.0000 mL | Freq: Once | INTRAVENOUS | Status: AC
  Administered 2022-12-14: 1000 mL via INTRAVENOUS

## 2022-12-14 MED ORDER — SODIUM CHLORIDE 0.9 % IV BOLUS
2000.0000 mL | Freq: Once | INTRAVENOUS | Status: AC
  Administered 2022-12-14 – 2022-12-15 (×2): 1000 mL via INTRAVENOUS

## 2022-12-14 NOTE — Plan of Care (Signed)

## 2022-12-14 NOTE — Progress Notes (Signed)
Progress Note   Patient: Roberto Stanton EXB:[id number] DOB: 25-Jun-1995 DOA: 12/13/2022     1 DOS: the patient was seen and examined on 12/14/2022   Brief hospital course: Roberto Stanton is a 27 y.o. male with medical history significant of McArdle disease, kidney stones and recent admission for acute rhabdomyolysis who was discharged on 12/01/2021 comes in again with generalized body pain.  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  According to him he has followed up with [facility] genetics testing and he sees a neurologist at [facility] for his McArdle disease.  Upon arrival to the emergency room CPK was 5837 and patient still had concerns of worsening pain and CPK was subsequently repeated and found to be 11 656.  Given worsening rhabdomyolysis hospitalist service was therefore contacted to admit patient for further management. Patient denies nausea vomiting abdominal pain chest pain cough headache or urinary complaints  Assessment and Plan:  Acute rhabdomyolysis Patient with underlying McArdle's disease presenting with generalized body pains Continue IV fluid Continue as needed pain management Monitor CPK daily Monitor renal function I have discussed the case with cross coverage physician as well as ED physician   McArdle's disease Patient follows up at [facility] neurology Outpatient follow-up at discharge Continue to monitor closely   DVT prophylaxis: On subcutaneous enoxaparin    Advance Care Planning:   Code Status: Full Code full code   Consults: None   Family Communication: No family at bedside    Subjective:  Patient seen and examined at bedside this morning Denies nausea vomiting Has been complaining of body pain however improving   Physical Exam: General: Young male laying in bed in no acute distress CVS: S1-S2 present no murmur  heard Abdomen: Moves with respiration nontender no masses palpable Respiratory: Clear to auscultation bilaterally CNS: Alert and oriented x 3 moving all extremities nonfocal Musculoskeletal: No abnormality detected Psychiatric: Normal mood   Data Reviewed: I have reviewed below mentioned labs as well as vitals    Latest Ref Rng & Units 12/14/2022    4:16 AM 12/12/2022   11:27 PM 12/02/2022    5:59 AM  CBC  WBC 4.0 - 10.5 K/uL 4.9  7.0  4.9   Hemoglobin 13.0 - 17.0 g/dL 10.2  72.5  36.6   Hematocrit 39.0 - 52.0 % 38.2  41.7  35.9   Platelets 150 - 400 K/uL 196  237  194        Latest Ref Rng & Units 12/14/2022    4:16 AM 12/12/2022   11:27 PM 12/02/2022    5:59 AM  BMP  Glucose 70 - 99 mg/dL 94  89  92   BUN 6 - 20 mg/dL 8  14  <5   Creatinine 0.61 - 1.24 mg/dL 4.40  3.47  4.25   Sodium 135 - 145 mmol/L 136  136  139   Potassium 3.5 - 5.1 mmol/L 3.5  3.5  3.5   Chloride 98 - 111 mmol/L 102  102  104   CO2 22 - 32 mmol/L 26  25  27    Calcium 8.9 - 10.3 mg/dL 8.7  9.2  8.5     Vitals:   12/13/22 1710 12/13/22 2342 12/14/22 0847 12/14/22 1644  BP: 127/80 106/65 104/66 112/75  Pulse: 92 81 72 62  Resp: 16 18 18  18  Temp: 98.3 F (36.8 C) 97.9 F (36.6 C) 98.1 F (36.7 C) 98 F (36.7 C)  TempSrc:   Oral Oral  SpO2: 98% 94% 96% 99%  Weight:      Height:        Data Reviewed: I have reviewed patient's lab report as well as vitals as shown including nursing documentation   Time spent: 55 minutes  Author: Loyce Dys, MD 12/14/2022 5:48 PM  For on call review [email or web address].

## 2022-12-14 NOTE — Plan of Care (Signed)

## 2022-12-15 DIAGNOSIS — E7404 McArdle disease: Secondary | ICD-10-CM | POA: Diagnosis not present

## 2022-12-15 DIAGNOSIS — T796XXA Traumatic ischemia of muscle, initial encounter: Secondary | ICD-10-CM | POA: Diagnosis not present

## 2022-12-15 DIAGNOSIS — R252 Cramp and spasm: Secondary | ICD-10-CM | POA: Diagnosis not present

## 2022-12-15 LAB — CK: Total CK: 12852 U/L — ABNORMAL HIGH (ref 49–397)

## 2022-12-15 MED ORDER — ACETAMINOPHEN 325 MG PO TABS: 650.0000 mg | ORAL_TABLET | Freq: Four times a day (QID) | ORAL | 0 refills | Status: DC | PRN

## 2022-12-15 NOTE — Plan of Care (Signed)

## 2022-12-15 NOTE — Plan of Care (Signed)

## 2022-12-15 NOTE — Discharge Summary (Signed)
Physician Discharge Summary   Patient: Roberto Stanton MRN: [id number] DOB: 03/24/1995  Admit date:     12/13/2022  Discharge date: 12/15/22  Discharge Physician: Loyce Dys   PCP: [facility]   Recommendations at discharge:  Follow-up with your PCP  Discharge Diagnoses: Acute rhabdomyolysis McArdle's disease   Hospital Course:  From HPI "Roberto Stanton is a 27 y.o. male with medical history significant of McArdle disease, kidney stones and recent admission for acute rhabdomyolysis who was discharged on 12/01/2021 comes in again with generalized body pain.  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  According to him he has followed up with [facility] genetics testing and he sees a neurologist at [facility] for his McArdle disease.  Upon arrival to the emergency room CPK was 5837 and patient still had concerns of worsening pain and CPK was subsequently repeated and found to be 11 656.  Given worsening rhabdomyolysis hospitalist service was therefore contacted to admit patient for further management.  "  During admission patient's patient's pain as well as CPK level plateaued and improved.  He is feeling a whole lot better now and therefore being discharged to follow-up with his PCP with adequate oral hydration.   Consultants: None Procedures performed: None Disposition: Home Diet recommendation:  Discharge Diet Orders (From admission, onward)     Start     Ordered   12/15/22 0000  Diet - low sodium heart healthy        12/15/22 1051           Regular diet DISCHARGE MEDICATION: Allergies as of 12/15/2022       Reactions   Morphine Hives        Medication List     TAKE these medications    acetaminophen 325 MG tablet Commonly known as: TYLENOL Take 2 tablets (650 mg total) by mouth every 6 (six) hours as  needed for mild pain (pain score 1-3) (or Fever >/= 101).   BC HEADACHE POWDER PO Take 1 packet by mouth daily as needed.   naloxone 4 MG/0.1ML Liqd nasal spray kit Commonly known as: NARCAN Place 1 spray into the nose once.   Oxycodone HCl 10 MG Tabs Take 1 tablet (10 mg total) by mouth every 6 (six) hours as needed.   Voltaren 1 % Gel Generic drug: diclofenac Sodium Apply 2 g topically 4 (four) times daily as needed.        Discharge Exam: Filed Weights   12/12/22 2325  Weight: 63.5 kg   General: Young male laying in bed in no acute distress CVS: S1-S2 present no murmur heard Abdomen: Moves with respiration nontender no masses palpable Respiratory: Clear to auscultation bilaterally CNS: Alert and oriented x 3 moving all extremities nonfocal Musculoskeletal: No abnormality detected Psychiatric: Normal mood  Condition at discharge: good  The results of significant diagnostics from this hospitalization (including imaging, microbiology, ancillary and laboratory) are listed below for reference.   Imaging Studies: DG Chest 2 View  Result Date: 12/13/2022 CLINICAL DATA:  Left-sided chest pain, dizziness, and shortness of breath for 2 days. EXAM: CHEST - 2 VIEW COMPARISON:  04/26/2022 FINDINGS: The heart size and mediastinal contours are within normal limits. Both lungs are clear. The visualized skeletal structures are unremarkable. IMPRESSION: No active cardiopulmonary disease. Electronically Signed   By: Burman Nieves  M.D.   On: 12/13/2022 00:22    Microbiology: Results for orders placed or performed during the hospital encounter of 04/26/22  Resp panel by RT-PCR (RSV, Flu A&B, Covid) Anterior Nasal Swab     Status: None   Collection Time: 04/26/22  6:21 PM   Specimen: Anterior Nasal Swab  Result Value Ref Range Status   SARS Coronavirus 2 by RT PCR NEGATIVE NEGATIVE Final    Comment: (NOTE) SARS-CoV-2 target nucleic acids are NOT DETECTED.  The SARS-CoV-2 RNA is  generally detectable in upper respiratory specimens during the acute phase of infection. The lowest concentration of SARS-CoV-2 viral copies this assay can detect is 138 copies/mL. A negative result does not preclude SARS-Cov-2 infection and should not be used as the sole basis for treatment or other patient management decisions. A negative result may occur with  improper specimen collection/handling, submission of specimen other than nasopharyngeal swab, presence of viral mutation(s) within the areas targeted by this assay, and inadequate number of viral copies(<138 copies/mL). A negative result must be combined with clinical observations, patient history, and epidemiological information. The expected result is Negative.  Fact Sheet for Patients:  BloggerCourse.com  Fact Sheet for Healthcare Providers:  SeriousBroker.it  This test is no t yet approved or cleared by the [place] FDA and  has been authorized for detection and/or diagnosis of SARS-CoV-2 by FDA under an Emergency Use Authorization (EUA). This EUA will remain  in effect (meaning this test can be used) for the duration of the COVID-19 declaration under Section 564(b)(1) of the Act, 21 U.S.C.section 360bbb-3(b)(1), unless the authorization is terminated  or revoked sooner.       Influenza A by PCR NEGATIVE NEGATIVE Final   Influenza B by PCR NEGATIVE NEGATIVE Final    Comment: (NOTE) The Xpert Xpress SARS-CoV-2/FLU/RSV plus assay is intended as an aid in the diagnosis of influenza from Nasopharyngeal swab specimens and should not be used as a sole basis for treatment. Nasal washings and aspirates are unacceptable for Xpert Xpress SARS-CoV-2/FLU/RSV testing.  Fact Sheet for Patients: BloggerCourse.com  Fact Sheet for Healthcare Providers: SeriousBroker.it  This test is not yet approved or cleared by the [place] FDA and has been authorized for detection and/or diagnosis of SARS-CoV-2 by FDA under an Emergency Use Authorization (EUA). This EUA will remain in effect (meaning this test can be used) for the duration of the COVID-19 declaration under Section 564(b)(1) of the Act, 21 U.S.C. section 360bbb-3(b)(1), unless the authorization is terminated or revoked.     Resp Syncytial Virus by PCR NEGATIVE NEGATIVE Final    Comment: (NOTE) Fact Sheet for Patients: BloggerCourse.com  Fact Sheet for Healthcare Providers: SeriousBroker.it  This test is not yet approved or cleared by the [place] FDA and has been authorized for detection and/or diagnosis of SARS-CoV-2 by FDA under an Emergency Use Authorization (EUA). This EUA will remain in effect (meaning this test can be used) for the duration of the COVID-19 declaration under Section 564(b)(1) of the Act, 21 U.S.C. section 360bbb-3(b)(1), unless the authorization is terminated or revoked.  Performed at [facility], [address] [place], [place] [address]     Labs: CBC: Recent Labs  Lab 12/12/22 2327 12/14/22 0416  WBC 7.0 4.9  HGB 14.5 13.4  HCT 41.7 38.2*  MCV 88.5 87.8  PLT 237 196   Basic Metabolic Panel: Recent Labs  Lab 12/12/22 2327 12/14/22 0416  NA 136 136  K 3.5 3.5  CL 102 102  CO2 25 26  GLUCOSE 89 94  BUN 14 8  CREATININE 0.64 0.65  CALCIUM 9.2 8.7*   Liver Function Tests: Recent Labs  Lab 12/13/22 0247  AST 95*  ALT 68*  ALKPHOS 72  BILITOT 0.9  PROT 7.5  ALBUMIN 3.9   CBG: No results for input(s): "GLUCAP" in the last 168 hours.  Discharge time spent:  36 minutes.  Signed: Loyce Dys, MD [facility] 12/15/2022

## 2022-12-15 NOTE — Progress Notes (Signed)
Reviewed discharge instructions with patient. Patient acknowledged understanding. Patient stated he was in pain and asked for oxycodone before leaving. Advised patient if oxycodone is administered he will have to wait 30 minutes after administration to leave and will need to be walked out. Patient stated he will wait and then leave. Assessed pain level and administered oxycodone. Patient walked out by himself with personal belongings.

## 2023-01-23 ENCOUNTER — Other Ambulatory Visit: Payer: Self-pay

## 2023-01-23 ENCOUNTER — Emergency Department: Payer: Medicaid Other

## 2023-01-23 ENCOUNTER — Inpatient Hospital Stay
Admission: EM | Admit: 2023-01-23 | Discharge: 2023-01-25 | DRG: 558 | Disposition: A | Discharge status: Home / Self Care | Payer: Medicaid Other | Attending: Obstetrics and Gynecology | Admitting: Obstetrics and Gynecology

## 2023-01-23 ENCOUNTER — Encounter: Payer: Self-pay | Admitting: Intensive Care

## 2023-01-23 DIAGNOSIS — Z87891 Personal history of nicotine dependence: Secondary | ICD-10-CM

## 2023-01-23 DIAGNOSIS — R748 Abnormal levels of other serum enzymes: Secondary | ICD-10-CM | POA: Diagnosis present

## 2023-01-23 DIAGNOSIS — R7401 Elevation of levels of liver transaminase levels: Secondary | ICD-10-CM | POA: Diagnosis present

## 2023-01-23 DIAGNOSIS — N451 Epididymitis: Secondary | ICD-10-CM | POA: Diagnosis present

## 2023-01-23 DIAGNOSIS — R000 Tachycardia, unspecified: Secondary | ICD-10-CM | POA: Diagnosis present

## 2023-01-23 DIAGNOSIS — N433 Hydrocele, unspecified: Secondary | ICD-10-CM | POA: Diagnosis present

## 2023-01-23 DIAGNOSIS — Z87442 Personal history of urinary calculi: Secondary | ICD-10-CM

## 2023-01-23 DIAGNOSIS — N50819 Testicular pain, unspecified: Secondary | ICD-10-CM | POA: Insufficient documentation

## 2023-01-23 DIAGNOSIS — Z8249 Family history of ischemic heart disease and other diseases of the circulatory system: Secondary | ICD-10-CM

## 2023-01-23 DIAGNOSIS — Z7982 Long term (current) use of aspirin: Secondary | ICD-10-CM

## 2023-01-23 DIAGNOSIS — M6282 Rhabdomyolysis: Principal | ICD-10-CM

## 2023-01-23 DIAGNOSIS — G8929 Other chronic pain: Secondary | ICD-10-CM | POA: Diagnosis present

## 2023-01-23 DIAGNOSIS — Z885 Allergy status to narcotic agent status: Secondary | ICD-10-CM

## 2023-01-23 DIAGNOSIS — F1120 Opioid dependence, uncomplicated: Secondary | ICD-10-CM | POA: Diagnosis present

## 2023-01-23 DIAGNOSIS — E7404 McArdle disease: Secondary | ICD-10-CM | POA: Diagnosis present

## 2023-01-23 DIAGNOSIS — N50812 Left testicular pain: Secondary | ICD-10-CM

## 2023-01-23 LAB — CBC WITH DIFFERENTIAL/PLATELET
Abs Immature Granulocytes: 0.01 10*3/uL (ref 0.00–0.07)
Basophils Absolute: 0.1 10*3/uL (ref 0.0–0.1)
Basophils Relative: 2 %
Eosinophils Absolute: 0.1 10*3/uL (ref 0.0–0.5)
Eosinophils Relative: 3 %
HCT: 42 % (ref 39.0–52.0)
Hemoglobin: 14.6 g/dL (ref 13.0–17.0)
Immature Granulocytes: 0 %
Lymphocytes Relative: 34 %
Lymphs Abs: 1.4 10*3/uL (ref 0.7–4.0)
MCH: 31.2 pg (ref 26.0–34.0)
MCHC: 34.8 g/dL (ref 30.0–36.0)
MCV: 89.7 fL (ref 80.0–100.0)
Monocytes Absolute: 0.4 10*3/uL (ref 0.1–1.0)
Monocytes Relative: 10 %
Neutro Abs: 2.1 10*3/uL (ref 1.7–7.7)
Neutrophils Relative %: 51 %
Platelets: 234 10*3/uL (ref 150–400)
RBC: 4.68 MIL/uL (ref 4.22–5.81)
RDW: 12.9 % (ref 11.5–15.5)
WBC: 4.1 10*3/uL (ref 4.0–10.5)
nRBC: 0 % (ref 0.0–0.2)

## 2023-01-23 LAB — COMPREHENSIVE METABOLIC PANEL
ALT: 91 U/L — ABNORMAL HIGH (ref 0–44)
AST: 104 U/L — ABNORMAL HIGH (ref 15–41)
Albumin: 4.1 g/dL (ref 3.5–5.0)
Alkaline Phosphatase: 68 U/L (ref 38–126)
Anion gap: 7 (ref 5–15)
BUN: 10 mg/dL (ref 6–20)
CO2: 25 mmol/L (ref 22–32)
Calcium: 9.2 mg/dL (ref 8.9–10.3)
Chloride: 102 mmol/L (ref 98–111)
Creatinine, Ser: 0.75 mg/dL (ref 0.61–1.24)
GFR, Estimated: >60 mL/min (ref >60)
Glucose, Bld: 84 mg/dL (ref 70–99)
Potassium: 3.8 mmol/L (ref 3.5–5.1)
Sodium: 134 mmol/L — ABNORMAL LOW (ref 135–145)
Total Bilirubin: 0.8 mg/dL (ref <1.2)
Total Protein: 7.9 g/dL (ref 6.5–8.1)

## 2023-01-23 LAB — URINALYSIS, ROUTINE W REFLEX MICROSCOPIC
Bilirubin Urine: NEGATIVE
Glucose, UA: NEGATIVE mg/dL
Ketones, ur: NEGATIVE mg/dL
Leukocytes,Ua: NEGATIVE
Nitrite: NEGATIVE
Protein, ur: NEGATIVE mg/dL
RBC / HPF: 0 RBC/hpf (ref 0–5)
Specific Gravity, Urine: 1.008 (ref 1.005–1.030)
Squamous Epithelial / HPF: 0 /[HPF] (ref 0–5)
pH: 6 (ref 5.0–8.0)

## 2023-01-23 LAB — CREATININE, SERUM
Creatinine, Ser: 0.82 mg/dL (ref 0.61–1.24)
GFR, Estimated: >60 mL/min (ref >60)

## 2023-01-23 LAB — CBC
HCT: 38.3 % — ABNORMAL LOW (ref 39.0–52.0)
Hemoglobin: 13.1 g/dL (ref 13.0–17.0)
MCH: 31 pg (ref 26.0–34.0)
MCHC: 34.2 g/dL (ref 30.0–36.0)
MCV: 90.5 fL (ref 80.0–100.0)
Platelets: 200 10*3/uL (ref 150–400)
RBC: 4.23 MIL/uL (ref 4.22–5.81)
RDW: 13.1 % (ref 11.5–15.5)
WBC: 8.6 10*3/uL (ref 4.0–10.5)
nRBC: 0 % (ref 0.0–0.2)

## 2023-01-23 LAB — CK
Total CK: 8916 U/L — ABNORMAL HIGH (ref 49–397)
Total CK: 21348 U/L — ABNORMAL HIGH (ref 49–397)

## 2023-01-23 LAB — CHLAMYDIA/NGC RT PCR (ARMC ONLY)
Chlamydia Tr: NOT DETECTED
N gonorrhoeae: NOT DETECTED

## 2023-01-23 MED ORDER — OXYCODONE HCL 5 MG PO TABS
10.0000 mg | ORAL_TABLET | Freq: Once | ORAL | Status: AC
  Administered 2023-01-23: 10 mg via ORAL
  Filled 2023-01-23: qty 2

## 2023-01-23 MED ORDER — DEXTROSE-SODIUM CHLORIDE 5-0.9 % IV SOLN: INTRAVENOUS | Status: AC

## 2023-01-23 MED ORDER — SODIUM CHLORIDE 0.9 % IV BOLUS
1000.0000 mL | Freq: Once | INTRAVENOUS | Status: AC
  Administered 2023-01-23: 1000 mL via INTRAVENOUS

## 2023-01-23 MED ORDER — ENOXAPARIN SODIUM 40 MG/0.4ML IJ SOSY: 40.0000 mg | PREFILLED_SYRINGE | INTRAMUSCULAR | Status: DC

## 2023-01-23 MED ORDER — ACETAMINOPHEN 650 MG RE SUPP: 650.0000 mg | Freq: Four times a day (QID) | RECTAL | Status: DC | PRN

## 2023-01-23 MED ORDER — ONDANSETRON HCL 4 MG/2ML IJ SOLN: 4.0000 mg | Freq: Four times a day (QID) | INTRAMUSCULAR | Status: DC | PRN

## 2023-01-23 MED ORDER — OXYCODONE HCL 5 MG PO TABS
5.0000 mg | ORAL_TABLET | ORAL | Status: DC | PRN
Start: 2023-01-23 — End: 2023-01-24

## 2023-01-23 MED ORDER — HYDROMORPHONE HCL 1 MG/ML IJ SOLN
0.5000 mg | INTRAMUSCULAR | Status: DC | PRN
  Administered 2023-01-23: 0.5 mg via INTRAVENOUS
  Administered 2023-01-24: 1 mg via INTRAVENOUS
  Filled 2023-01-23 (×2): qty 1

## 2023-01-23 MED ORDER — ONDANSETRON HCL 4 MG PO TABS: 4.0000 mg | ORAL_TABLET | Freq: Four times a day (QID) | ORAL | Status: DC | PRN

## 2023-01-23 MED ORDER — ACETAMINOPHEN 325 MG PO TABS: 650.0000 mg | ORAL_TABLET | Freq: Four times a day (QID) | ORAL | Status: DC | PRN

## 2023-01-23 MED ORDER — HYDROMORPHONE HCL 1 MG/ML IJ SOLN
1.0000 mg | Freq: Once | INTRAMUSCULAR | Status: AC
  Administered 2023-01-23: 1 mg via INTRAVENOUS
  Filled 2023-01-23: qty 1

## 2023-01-23 NOTE — Assessment & Plan Note (Addendum)
McArdle disease-myophosphorylase deficiency/glycogen-storage disease V CK 8916--> 21,348 (history of peaks up to 90,000) IV hydration Pain control Monitor CK

## 2023-01-23 NOTE — Assessment & Plan Note (Signed)
Likely secondary to rhabdo, typical during his episodes Expecting improvement with hydration

## 2023-01-23 NOTE — ED Triage Notes (Signed)
Patient c/o scrotum pain that started 2-3 days ago. Reports swelling and left sided pain is worse. 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

## 2023-01-23 NOTE — Assessment & Plan Note (Signed)
Left hydrocele Testicular ultrasound showing no evidence of mass or torsion Outpatient urology follow-up if persistent

## 2023-01-23 NOTE — H&P (Signed)
History and Physical    Patient: Roberto Stanton ZOX:[id number] DOB: 22-Oct-1995 DOA: 01/23/2023 DOS: the patient was seen and examined on 01/23/2023 PCP: [facility]  Patient coming from: Home  Chief Complaint:  Chief Complaint  Patient presents with   Testicle Pain    HPI: Roberto Stanton is a 27 y.o. male with medical history significant for myophosphorylase deficiency (glycogen storage disease V, McArdle disease), with frequent hospitalizations for exercise-induced painful nontraumatic rhabdomyolysis(past CK peaks up to 90,000), most recently October 2024 who presents to the ED for evaluation of muscle pain in extremities typical of his rhabdo episodes as well as new testicular pain.  He has had no fever or chills and no dysuria or penile discharge. ED course and data review: Tachycardia with otherwise normal vitalsForLabs notable CK 8916--> 21,348 Mild transaminitis with AST 104 and ALT 91 Urinalysis showing rare bacteria large hemoglobin.  GC and Chlamydia PCR negative Scrotal ultrasound showing small left hydrocele, negative for testicular mass or torsion Patient treated with NS boluses, given hydromorphone for pain Hospitalist consulted for admission.   Review of Systems: As mentioned in the history of present illness. All other systems reviewed and are negative.  Past Medical History:  Diagnosis Date   Kidney stones    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 No pertinent surgical history. Social History:  reports that he has quit smoking. His smoking use included cigarettes. He has never used smokeless tobacco. He reports current drug use. Drug: Marijuana. He reports that he does not drink alcohol.  Allergies  Allergen Reactions   Morphine Hives    Family History  Problem Relation Age of Onset   Heart disease Father     Prior to Admission medications   Medication Sig Start Date End Date Taking?  Authorizing Provider  acetaminophen (TYLENOL) 325 MG tablet Take 2 tablets (650 mg total) by mouth every 6 (six) hours as needed for mild pain (pain score 1-3) (or Fever >/= 101). 12/15/22  Yes Loyce Dys, MD  Aspirin-Salicylamide-Caffeine (BC HEADACHE POWDER PO) Take 1 packet by mouth daily as needed.   Yes [provider]  diclofenac Sodium (VOLTAREN) 1 % GEL Apply 2 g topically 4 (four) times daily as needed.   Yes [provider]  naloxone (NARCAN) nasal spray 4 mg/0.1 mL Place 1 spray into the nose once.   Yes [provider]  Oxycodone HCl 10 MG TABS Take 1 tablet (10 mg total) by mouth every 6 (six) hours as needed. 12/02/22  Yes Lurene Shadow, MD    Physical Exam: Vitals:   01/23/23 1553 01/23/23 1600 01/23/23 1700 01/23/23 1933  BP: 130/85 124/81  110/80  Pulse: (!) 102 (!) 107 (!) 56 88  Resp: 18   16  Temp: 98.4 F (36.9 C)   98.2 F (36.8 C)  TempSrc: Oral   Axillary  SpO2: 99% 100% (!) 77% 98%  Weight:      Height:       Physical Exam Vitals and nursing note reviewed.  Constitutional:      General: He is not in acute distress. HENT:     Head: Normocephalic and atraumatic.  Cardiovascular:     Rate and Rhythm: Normal rate and regular rhythm.     Heart sounds: Normal heart sounds.  Pulmonary:     Effort: Pulmonary effort is normal.     Breath sounds: Normal breath sounds.  Abdominal:  Palpations: Abdomen is soft.     Tenderness: There is no abdominal tenderness.  Neurological:     Mental Status: Mental status is at baseline.     Labs on Admission: I have personally reviewed following labs and imaging studies  CBC: Recent Labs  Lab 01/23/23 1147 01/23/23 2131  WBC 4.1 8.6  NEUTROABS 2.1  --   HGB 14.6 13.1  HCT 42.0 38.3*  MCV 89.7 90.5  PLT 234 200   Basic Metabolic Panel: Recent Labs  Lab 01/23/23 1147 01/23/23 2131  NA 134*  --   K 3.8  --   CL 102  --   CO2 25  --   GLUCOSE 84  --   BUN 10  --    CREATININE 0.75 0.82  CALCIUM 9.2  --    GFR: Estimated Creatinine Clearance: 120.5 mL/min (by C-G formula based on SCr of 0.82 mg/dL). Liver Function Tests: Recent Labs  Lab 01/23/23 1147  AST 104*  ALT 91*  ALKPHOS 68  BILITOT 0.8  PROT 7.9  ALBUMIN 4.1   No results for input(s): "LIPASE", "AMYLASE" in the last 168 hours. No results for input(s): "AMMONIA" in the last 168 hours. Coagulation Profile: No results for input(s): "INR", "PROTIME" in the last 168 hours. Cardiac Enzymes: Recent Labs  Lab 01/23/23 1147 01/23/23 1732  CKTOTAL 8,916* 21,348*   BNP (last 3 results) No results for input(s): "PROBNP" in the last 8760 hours. HbA1C: No results for input(s): "HGBA1C" in the last 72 hours. CBG: No results for input(s): "GLUCAP" in the last 168 hours. Lipid Profile: No results for input(s): "CHOL", "HDL", "LDLCALC", "TRIG", "CHOLHDL", "LDLDIRECT" in the last 72 hours. Thyroid Function Tests: No results for input(s): "TSH", "T4TOTAL", "FREET4", "T3FREE", "THYROIDAB" in the last 72 hours. Anemia Panel: No results for input(s): "VITAMINB12", "FOLATE", "FERRITIN", "TIBC", "IRON", "RETICCTPCT" in the last 72 hours. Urine analysis:    Component Value Date/Time   COLORURINE YELLOW (A) 01/23/2023 1554   APPEARANCEUR CLEAR (A) 01/23/2023 1554   LABSPEC 1.008 01/23/2023 1554   PHURINE 6.0 01/23/2023 1554   GLUCOSEU NEGATIVE 01/23/2023 1554   HGBUR LARGE (A) 01/23/2023 1554   BILIRUBINUR NEGATIVE 01/23/2023 1554   KETONESUR NEGATIVE 01/23/2023 1554   PROTEINUR NEGATIVE 01/23/2023 1554   NITRITE NEGATIVE 01/23/2023 1554   LEUKOCYTESUR NEGATIVE 01/23/2023 1554    Radiological Exams on Admission: US SCROTUM W/DOPPLER  Result Date: 01/23/2023 CLINICAL DATA:  Left testicle pain for 2 days EXAM: SCROTAL ULTRASOUND DOPPLER ULTRASOUND OF THE TESTICLES TECHNIQUE: Complete ultrasound examination of the testicles, epididymis, and other scrotal structures was performed. Color and  spectral Doppler ultrasound were also utilized to evaluate blood flow to the testicles. COMPARISON:  None Available. FINDINGS: Right testicle Measurements: 4 x 1.5 x 2.2 cm. Negative for mass. Scattered microcalcifications. Left testicle Measurements: 3.3 x 1.7 x 2.2 cm. Negative for mass. Scattered microcalcifications. Right epididymis:  Normal in size and appearance. Left epididymis:  Normal in size and appearance. Hydrocele:  Small left hydrocele Varicocele:  None visualized. Pulsed Doppler interrogation of both testes demonstrates normal low resistance arterial and venous waveforms bilaterally. IMPRESSION: 1. Negative for testicular mass or torsion. 2. Small left hydrocele. 3. Scattered bilateral testicular microcalcifications. Current literature suggests that testicular microlithiasis is not a significant independent risk factor for development of testicular carcinoma, and that follow up imaging is not warranted in the absence of other risk factors. Monthly testicular self-examination and annual physical exams are considered appropriate surveillance. If patient has other risk factors  for testicular carcinoma, then referral to Urology should be considered. (Reference: DeCastro, et al.: A 5-Year Follow up Study of Asymptomatic Men with Testicular Microlithiasis. J Urol 2008; 179:1420-1423.) Electronically Signed   By: Jasmine Pang M.D.   On: 01/23/2023 17:01     Data Reviewed: Relevant notes from primary care and specialist visits, past discharge summaries as available in EHR, including Care Everywhere. Prior diagnostic testing as pertinent to current admission diagnoses Updated medications and problem lists for reconciliation ED course, including vitals, labs, imaging, treatment and response to treatment Triage notes, nursing and pharmacy notes and ED provider's notes Notable results as noted in HPI   Assessment and Plan: * Non-traumatic rhabdomyolysis, recurrent McArdle disease-myophosphorylase  deficiency/glycogen-storage disease V CK 8916--> 21,348 (history of peaks up to 90,000) IV hydration Multimodal pain control Monitor CK  Transaminitis Likely secondary to rhabdo, typical during his episodes Expecting improvement with hydration  Testicular pain Left hydrocele Testicular ultrasound showing no evidence of mass or torsion Outpatient urology follow-up if persistent     DVT prophylaxis: Lovenox  Consults: none  Advance Care Planning:   Code Status: Full Code   Family Communication: none  Disposition Plan: Back to previous home environment  Severity of Illness: The appropriate patient status for this patient is OBSERVATION. Observation status is judged to be reasonable and necessary in order to provide the required intensity of service to ensure the patient's safety. The patient's presenting symptoms, physical exam findings, and initial radiographic and laboratory data in the context of their medical condition is felt to place them at decreased risk for further clinical deterioration. Furthermore, it is anticipated that the patient will be medically stable for discharge from the hospital within 2 midnights of admission.   Author: Andris Baumann, MD 01/23/2023 11:47 PM  For on call review [email or web address].

## 2023-01-23 NOTE — ED Provider Notes (Signed)
[facility] Provider Note    Event Date/Time   First MD Initiated Contact with Patient 01/23/23 1501     (approximate)   History   Testicle Pain   HPI ROSALIE Stanton is a 27 y.o. male with history of McArdle's disease presenting today for testicular pain and extremity pain.  Patient states he had pain starting on the left side of his scrotum approximately 2 days ago.  It has been constant since then and appears to be worsening.  Also states having pain in his arms and extremities which is worsened over the past couple days and feels similar to the past when his CK level has gone up.  Denies chest pain, shortness of breath, nausea, vomiting, abdominal pain, diarrhea, constipation, dysuria, urethral discharge.  Chart review: Patient with multiple prior admissions within the past couple of months for elevated CK level requiring pain medicine and fluid rehydration for symptomatic improvement.     Physical Exam   Triage Vital Signs: ED Triage Vitals [01/23/23 1145]  Encounter Vitals Group     BP (!) 139/98     Systolic BP Percentile      Diastolic BP Percentile      Pulse Rate (!) 102     Resp 16     Temp 98.6 F (37 C)     Temp Source Oral     SpO2 99 %     Weight 140 lb (63.5 kg)     Height 5\' 9"  (1.753 m)     Head Circumference      Peak Flow      Pain Score 8     Pain Loc      Pain Education      Exclude from Growth Chart     Most recent vital signs: Vitals:   01/23/23 1700 01/23/23 1933  BP:  110/80  Pulse: (!) 56 88  Resp:  16  Temp:  98.2 F (36.8 C)  SpO2: (!) 77% 98%   Physical Exam: I have reviewed the vital signs and nursing notes. General: Awake, alert, no acute distress.  Nontoxic appearing. Head:  Atraumatic, normocephalic.   ENT:  EOM intact, PERRL. Oral mucosa is pink and moist with no lesions. Neck: Neck is supple with full range of motion, No meningeal signs. Cardiovascular:  RRR, No murmurs. Peripheral pulses  palpable and equal bilaterally. Respiratory:  Symmetrical chest wall expansion.  No rhonchi, rales, or wheezes.  Good air movement throughout.  No use of accessory muscles.   Musculoskeletal:  No cyanosis or edema. Moving extremities with full ROM.  Generalized tenderness palpation throughout extremities. Abdomen:  Soft, nontender, nondistended. GU: Mild tenderness palpation of the left side of testicle without noticeable edema, erythema, or other discoloration Neuro:  GCS 15, moving all four extremities, interacting appropriately. Speech clear. Psych:  Calm, appropriate.   Skin:  Warm, dry, no rash.    ED Results / Procedures / Treatments   Labs (all labs ordered are listed, but only abnormal results are displayed) Labs Reviewed  COMPREHENSIVE METABOLIC PANEL - Abnormal; Notable for the following components:      Result Value   Sodium 134 (*)    AST 104 (*)    ALT 91 (*)    All other components within normal limits  CK - Abnormal; Notable for the following components:   Total CK 8,916 (*)    All other components within normal limits  URINALYSIS, ROUTINE W REFLEX MICROSCOPIC - Abnormal; Notable for  the following components:   Color, Urine YELLOW (*)    APPearance CLEAR (*)    Hgb urine dipstick LARGE (*)    Bacteria, UA RARE (*)    All other components within normal limits  CK - Abnormal; Notable for the following components:   Total CK 21,348 (*)    All other components within normal limits  CHLAMYDIA/NGC RT PCR ([facility] ONLY)            CBC WITH DIFFERENTIAL/PLATELET     EKG    RADIOLOGY Independently interpreted scrotal ultrasound with no acute findings aside from small left hydrocele   PROCEDURES:  Critical Care performed: Yes, see critical care procedure note(s)  .Critical Care  Performed by: Janith Lima, MD Authorized by: Janith Lima, MD   Critical care provider statement:    Critical care time (minutes):  30   Critical care was necessary to treat or  prevent imminent or life-threatening deterioration of the following conditions: Rhabdomyolysis.   Critical care was time spent personally by me on the following activities:  Development of treatment plan with patient or surrogate, discussions with consultants, evaluation of patient's response to treatment, examination of patient, ordering and review of laboratory studies, ordering and review of radiographic studies, ordering and performing treatments and interventions, pulse oximetry, re-evaluation of patient's condition and review of old charts   I assumed direction of critical care for this patient from another provider in my specialty: no     Care discussed with: admitting provider      MEDICATIONS ORDERED IN ED: Medications  sodium chloride 0.9 % bolus 1,000 mL (0 mLs Intravenous Stopped 01/23/23 1741)  HYDROmorphone (DILAUDID) injection 1 mg (1 mg Intravenous Given 01/23/23 1553)  oxyCODONE (Oxy IR/ROXICODONE) immediate release tablet 10 mg (10 mg Oral Given 01/23/23 1739)  sodium chloride 0.9 % bolus 1,000 mL (1,000 mLs Intravenous New Bag/Given 01/23/23 2003)     IMPRESSION / MDM / ASSESSMENT AND PLAN / ED COURSE  I reviewed the triage vital signs and the nursing notes.                              Differential diagnosis includes, but is not limited to, exacerbation of McArdle's disease with rhabdomyolysis, testicular torsion, epididymitis, cystitis  Patient's presentation is most consistent with acute presentation with potential threat to life or bodily function.  Patient is a 27 year old male with history of McArdle's disease presenting today for multiple complaints including extremity pain consistent with prior McArdle's exacerbations and rhabdo along with testicular pain.  Laboratory workup initially notable for CK level of 8900 which is less than several months ago when he was last in the hospital.  CBC and CMP otherwise unremarkable.  Will get UA and scrotal ultrasound as well for  further evaluation of testicular pain.  Patient given 1 L fluids and Dilaudid to treat pain symptoms and will reassess with second CK after completion.  Scrotal ultrasound unremarkable aside from small left hydrocele.  UA with large hemoglobin and no RBCs concerning for rhabdomyolysis.  Repeat CK now at 21,000 concerning for worsening rhabdo.  Given ongoing pain symptoms.  Will continue treating with fluids and pain medicine and admit to hospitalist for ongoing monitoring.  The patient is on the cardiac monitor to evaluate for evidence of arrhythmia and/or significant heart rate changes. Clinical Course as of 01/23/23 2005  Wed Jan 23, 2023  1708 US SCROTUM W/DOPPLER Small left hydrocele  but no other acute pathology [DW]  1807 Urinalysis, Routine w reflex microscopic -Urine, Clean Catch(!) Large hemoglobin with no RBCs concerning for rhabdomyolysis [DW]  1910 Called lab for update on delayed CK.  They are still running it now [DW]    Clinical Course User Index [DW] Janith Lima, MD     FINAL CLINICAL IMPRESSION(S) / ED DIAGNOSES   Final diagnoses:  Non-traumatic rhabdomyolysis  McArdle's disease (HCC)     Rx / DC Orders   ED Discharge Orders     None        Note:  This document was prepared using Dragon voice recognition software and may include unintentional dictation errors.   Janith Lima, MD 01/23/23 2010

## 2023-01-24 ENCOUNTER — Encounter: Payer: Self-pay | Admitting: Obstetrics and Gynecology

## 2023-01-24 DIAGNOSIS — R748 Abnormal levels of other serum enzymes: Secondary | ICD-10-CM | POA: Diagnosis present

## 2023-01-24 DIAGNOSIS — F1120 Opioid dependence, uncomplicated: Secondary | ICD-10-CM | POA: Diagnosis present

## 2023-01-24 DIAGNOSIS — Z7982 Long term (current) use of aspirin: Secondary | ICD-10-CM | POA: Diagnosis not present

## 2023-01-24 DIAGNOSIS — Z87891 Personal history of nicotine dependence: Secondary | ICD-10-CM | POA: Diagnosis not present

## 2023-01-24 DIAGNOSIS — N433 Hydrocele, unspecified: Secondary | ICD-10-CM | POA: Diagnosis present

## 2023-01-24 DIAGNOSIS — N451 Epididymitis: Secondary | ICD-10-CM | POA: Insufficient documentation

## 2023-01-24 DIAGNOSIS — Z8249 Family history of ischemic heart disease and other diseases of the circulatory system: Secondary | ICD-10-CM | POA: Diagnosis not present

## 2023-01-24 DIAGNOSIS — M6282 Rhabdomyolysis: Secondary | ICD-10-CM | POA: Diagnosis present

## 2023-01-24 DIAGNOSIS — Z87442 Personal history of urinary calculi: Secondary | ICD-10-CM | POA: Diagnosis not present

## 2023-01-24 DIAGNOSIS — E7404 McArdle disease: Secondary | ICD-10-CM | POA: Diagnosis present

## 2023-01-24 DIAGNOSIS — G8929 Other chronic pain: Secondary | ICD-10-CM | POA: Diagnosis present

## 2023-01-24 DIAGNOSIS — Z885 Allergy status to narcotic agent status: Secondary | ICD-10-CM | POA: Diagnosis not present

## 2023-01-24 DIAGNOSIS — R7401 Elevation of levels of liver transaminase levels: Secondary | ICD-10-CM | POA: Diagnosis present

## 2023-01-24 DIAGNOSIS — R000 Tachycardia, unspecified: Secondary | ICD-10-CM | POA: Diagnosis present

## 2023-01-24 LAB — BASIC METABOLIC PANEL
Anion gap: 8 (ref 5–15)
BUN: 6 mg/dL (ref 6–20)
CO2: 27 mmol/L (ref 22–32)
Calcium: 8.5 mg/dL — ABNORMAL LOW (ref 8.9–10.3)
Chloride: 103 mmol/L (ref 98–111)
Creatinine, Ser: 0.74 mg/dL (ref 0.61–1.24)
GFR, Estimated: >60 mL/min (ref >60)
Glucose, Bld: 70 mg/dL (ref 70–99)
Potassium: 3.5 mmol/L (ref 3.5–5.1)
Sodium: 138 mmol/L (ref 135–145)

## 2023-01-24 LAB — CK: Total CK: 26523 U/L — ABNORMAL HIGH (ref 49–397)

## 2023-01-24 MED ORDER — HYDROMORPHONE HCL 1 MG/ML IJ SOLN: 1.0000 mg | INTRAMUSCULAR | Status: DC | PRN

## 2023-01-24 MED ORDER — HYDROMORPHONE HCL 1 MG/ML IJ SOLN
2.0000 mg | INTRAMUSCULAR | Status: DC | PRN
  Filled 2023-01-24: qty 2

## 2023-01-24 MED ORDER — OXYCODONE HCL 5 MG PO TABS
15.0000 mg | ORAL_TABLET | ORAL | Status: DC | PRN
  Administered 2023-01-24 – 2023-01-25 (×3): 15 mg via ORAL
  Filled 2023-01-24 (×4): qty 3

## 2023-01-24 MED ORDER — OXYCODONE HCL 5 MG PO TABS
20.0000 mg | ORAL_TABLET | ORAL | Status: DC | PRN
  Administered 2023-01-24 (×2): 20 mg via ORAL
  Filled 2023-01-24 (×2): qty 4

## 2023-01-24 MED ORDER — HYDROMORPHONE HCL 1 MG/ML IJ SOLN
1.0000 mg | Freq: Once | INTRAMUSCULAR | Status: AC
  Administered 2023-01-24: 1 mg via INTRAVENOUS
  Filled 2023-01-24: qty 1

## 2023-01-24 MED ORDER — SULFAMETHOXAZOLE-TRIMETHOPRIM 800-160 MG PO TABS
1.0000 | ORAL_TABLET | Freq: Two times a day (BID) | ORAL | Status: DC
  Filled 2023-01-24 (×3): qty 1

## 2023-01-24 MED ORDER — OXYCODONE HCL 5 MG PO TABS: 10.0000 mg | ORAL_TABLET | ORAL | Status: DC | PRN

## 2023-01-24 MED ORDER — OXYCODONE HCL 5 MG PO TABS
15.0000 mg | ORAL_TABLET | Freq: Four times a day (QID) | ORAL | Status: DC | PRN
  Administered 2023-01-24: 15 mg via ORAL
  Filled 2023-01-24: qty 3

## 2023-01-24 NOTE — Plan of Care (Signed)
  Problem: Education: Goal: Knowledge of General Education information will improve Description: Including pain rating scale, medication(s)/side effects and non-pharmacologic comfort measures Outcome: Progressing   Problem: Clinical Measurements: Goal: Will remain free from infection Outcome: Progressing   

## 2023-01-24 NOTE — Progress Notes (Signed)
Pt is refusing his antibiotic and Lovenox. Also he wants 1 mg dilaudid vs 2 mg and he wants it every 2 hours. Here for muscle weakness, and pain control Pt states he doesn't need it. education provided and he wants to talk to the MD in the AM. Roberto Asters NP made aware.

## 2023-01-24 NOTE — ED Notes (Signed)
ED TO INPATIENT HANDOFF REPORT  ED Nurse Name and Phone #: Angelique Holm, RN  S Name/Age/Gender Roberto Stanton 27 y.o. male Room/Bed: ED37A/ED37A  Code Status   Code Status: Full Code  Home/SNF/Other Home Patient oriented to: self, place, time, and situation Is this baseline? Yes   Triage Complete: Triage complete  Chief Complaint Rhabdomyolysis [M62.82]  Triage Note Patient c/o scrotum pain that started 2-3 days ago. Reports swelling and left sided pain is worse. 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   Allergies Allergies  Allergen Reactions   Morphine Hives    Level of Care/Admitting Diagnosis ED Disposition     ED Disposition  Admit   Condition  --   Comment  Hospital Area: [facility] [[id number]]  Level of Care: Med-Surg [16]  Covid Evaluation: Asymptomatic - no recent exposure (last 10 days) testing not required  Diagnosis: Rhabdomyolysis [728.88.ICD-9-CM]  Admitting Physician: Erenest Blank  Attending Physician: Erenest Blank  Certification:: I certify this patient will need inpatient services for at least 2 midnights          B Medical/Surgery History Past Medical History:  Diagnosis Date   Kidney stones    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 No pertinent surgical history.   A IV Location/Drains/Wounds Patient Lines/Drains/Airways Status     Active Line/Drains/Airways     Name Placement date Placement time Site Days   Peripheral IV 01/23/23 20 G Right Forearm 01/23/23  1553  Forearm  1            Intake/Output Last 24 hours  Intake/Output Summary (Last 24 hours) at 01/24/2023 2021 Last data filed at 01/24/2023 1610 Gross per 24 hour  Intake 999 ml  Output 1400 ml  Net -401 ml    Labs/Imaging Results for orders placed or performed during the  hospital encounter of 01/23/23 (from the past 48 hour(s))  CBC with Differential     Status: None   Collection Time: 01/23/23 11:47 AM  Result Value Ref Range   WBC 4.1 4.0 - 10.5 K/uL   RBC 4.68 4.22 - 5.81 MIL/uL   Hemoglobin 14.6 13.0 - 17.0 g/dL   HCT 86.5 78.4 - 69.6 %   MCV 89.7 80.0 - 100.0 fL   MCH 31.2 26.0 - 34.0 pg   MCHC 34.8 30.0 - 36.0 g/dL   RDW 29.5 28.4 - 13.2 %   Platelets 234 150 - 400 K/uL   nRBC 0.0 0.0 - 0.2 %   Neutrophils Relative % 51 %   Neutro Abs 2.1 1.7 - 7.7 K/uL   Lymphocytes Relative 34 %   Lymphs Abs 1.4 0.7 - 4.0 K/uL   Monocytes Relative 10 %   Monocytes Absolute 0.4 0.1 - 1.0 K/uL   Eosinophils Relative 3 %   Eosinophils Absolute 0.1 0.0 - 0.5 K/uL   Basophils Relative 2 %   Basophils Absolute 0.1 0.0 - 0.1 K/uL   Immature Granulocytes 0 %   Abs Immature Granulocytes 0.01 0.00 - 0.07 K/uL    Comment: Performed at [facility], [address] [place], [place] [address]  Comprehensive metabolic panel     Status: Abnormal   Collection Time: 01/23/23 11:47 AM  Result Value Ref Range   Sodium 134 (L) 135 - 145 mmol/L  Potassium 3.8 3.5 - 5.1 mmol/L   Chloride 102 98 - 111 mmol/L   CO2 25 22 - 32 mmol/L   Glucose, Bld 84 70 - 99 mg/dL    Comment: Glucose reference range applies only to samples taken after fasting for at least 8 hours.   BUN 10 6 - 20 mg/dL   Creatinine, Ser 4.09 0.61 - 1.24 mg/dL   Calcium 9.2 8.9 - 81.1 mg/dL   Total Protein 7.9 6.5 - 8.1 g/dL   Albumin 4.1 3.5 - 5.0 g/dL   AST 914 (H) 15 - 41 U/L   ALT 91 (H) 0 - 44 U/L   Alkaline Phosphatase 68 38 - 126 U/L   Total Bilirubin 0.8 <1.2 mg/dL   GFR, Estimated >78 >29 mL/min    Comment: (NOTE) Calculated using the CKD-EPI Creatinine Equation (2021)    Anion gap 7 5 - 15    Comment: Performed at [facility], [address] [place], [place] [address]  CK     Status: Abnormal   Collection Time: 01/23/23 11:47 AM  Result Value Ref Range   Total CK  8,916 (H) 49 - 397 U/L    Comment: RESULT CONFIRMED BY MANUAL DILUTION   SH/SS Performed at [facility], [address] [place], [place] [address]   Urinalysis, Routine w reflex microscopic -Urine, Clean Catch     Status: Abnormal   Collection Time: 01/23/23  3:54 PM  Result Value Ref Range   Color, Urine YELLOW (A) YELLOW   APPearance CLEAR (A) CLEAR   Specific Gravity, Urine 1.008 1.005 - 1.030   pH 6.0 5.0 - 8.0   Glucose, UA NEGATIVE NEGATIVE mg/dL   Hgb urine dipstick LARGE (A) NEGATIVE   Bilirubin Urine NEGATIVE NEGATIVE   Ketones, ur NEGATIVE NEGATIVE mg/dL   Protein, ur NEGATIVE NEGATIVE mg/dL   Nitrite NEGATIVE NEGATIVE   Leukocytes,Ua NEGATIVE NEGATIVE   RBC / HPF 0 0 - 5 RBC/hpf   WBC, UA 0-5 0 - 5 WBC/hpf   Bacteria, UA RARE (A) NONE SEEN   Squamous Epithelial / HPF 0 0 - 5 /HPF   Mucus PRESENT     Comment: Performed at [facility], [address] [place], [place] [address]  Chlamydia/NGC rt PCR [place] only)     Status: None   Collection Time: 01/23/23  3:54 PM   Specimen: Urine, Clean Catch  Result Value Ref Range   Specimen source GC/Chlam URINE, RANDOM    Chlamydia Tr NOT DETECTED NOT DETECTED   N gonorrhoeae NOT DETECTED NOT DETECTED    Comment: (NOTE) This CT/NG assay has not been evaluated in patients with a history of  hysterectomy. Performed at [facility], [address] [place], [place] [address]   CK     Status: Abnormal   Collection Time: 01/23/23  5:32 PM  Result Value Ref Range   Total CK 21,348 (H) 49 - 397 U/L    Comment: RESULT CONFIRMED BY MANUAL DILUTION skl Performed at [facility], [address] [place], [place] [address]   CBC     Status: Abnormal   Collection Time: 01/23/23  9:31 PM  Result Value Ref Range   WBC 8.6 4.0 - 10.5 K/uL   RBC 4.23 4.22 - 5.81 MIL/uL   Hemoglobin 13.1 13.0 - 17.0 g/dL   HCT 40.1 (L) 02.7 - 25.3 %   MCV 90.5 80.0 - 100.0 fL   MCH 31.0 26.0 - 34.0 pg  MCHC  34.2 30.0 - 36.0 g/dL   RDW 29.5 28.4 - 13.2 %   Platelets 200 150 - 400 K/uL   nRBC 0.0 0.0 - 0.2 %    Comment: Performed at [facility], [address] [place], [place] [address]  Creatinine, serum     Status: None   Collection Time: 01/23/23  9:31 PM  Result Value Ref Range   Creatinine, Ser 0.82 0.61 - 1.24 mg/dL   GFR, Estimated >27 >25 mL/min    Comment: (NOTE) Calculated using the CKD-EPI Creatinine Equation (2021) Performed at [facility], [address] [place], [place] [address]   CK     Status: Abnormal   Collection Time: 01/24/23  8:43 AM  Result Value Ref Range   Total CK 26,523 (H) 49 - 397 U/L    Comment: RESULT CONFIRMED BY MANUAL DILUTION DAS Performed at [facility], [address] [place], [place] [address]   Basic metabolic panel     Status: Abnormal   Collection Time: 01/24/23  8:43 AM  Result Value Ref Range   Sodium 138 135 - 145 mmol/L   Potassium 3.5 3.5 - 5.1 mmol/L   Chloride 103 98 - 111 mmol/L   CO2 27 22 - 32 mmol/L   Glucose, Bld 70 70 - 99 mg/dL    Comment: Glucose reference range applies only to samples taken after fasting for at least 8 hours.   BUN 6 6 - 20 mg/dL   Creatinine, Ser 2.59 0.61 - 1.24 mg/dL   Calcium 8.5 (L) 8.9 - 10.3 mg/dL   GFR, Estimated >56 >38 mL/min    Comment: (NOTE) Calculated using the CKD-EPI Creatinine Equation (2021)    Anion gap 8 5 - 15    Comment: Performed at [facility], [address] [place], [place] [address]   US SCROTUM W/DOPPLER  Result Date: 01/23/2023 CLINICAL DATA:  Left testicle pain for 2 days EXAM: SCROTAL ULTRASOUND DOPPLER ULTRASOUND OF THE TESTICLES TECHNIQUE: Complete ultrasound examination of the testicles, epididymis, and other scrotal structures was performed. Color and spectral Doppler ultrasound were also utilized to evaluate blood flow to the testicles. COMPARISON:  None Available. FINDINGS: Right testicle Measurements: 4 x 1.5 x 2.2 cm.  Negative for mass. Scattered microcalcifications. Left testicle Measurements: 3.3 x 1.7 x 2.2 cm. Negative for mass. Scattered microcalcifications. Right epididymis:  Normal in size and appearance. Left epididymis:  Normal in size and appearance. Hydrocele:  Small left hydrocele Varicocele:  None visualized. Pulsed Doppler interrogation of both testes demonstrates normal low resistance arterial and venous waveforms bilaterally. IMPRESSION: 1. Negative for testicular mass or torsion. 2. Small left hydrocele. 3. Scattered bilateral testicular microcalcifications. Current literature suggests that testicular microlithiasis is not a significant independent risk factor for development of testicular carcinoma, and that follow up imaging is not warranted in the absence of other risk factors. Monthly testicular self-examination and annual physical exams are considered appropriate surveillance. If patient has other risk factors for testicular carcinoma, then referral to Urology should be considered. (Reference: DeCastro, et al.: A 5-Year Follow up Study of Asymptomatic Men with Testicular Microlithiasis. J Urol 2008; 179:1420-1423.) Electronically Signed   By: Jasmine Pang M.D.   On: 01/23/2023 17:01    Pending Labs Unresulted Labs (From admission, onward)     Start     Ordered   01/30/23 0500  Creatinine, serum  (enoxaparin (LOVENOX)    CrCl >/= 30 ml/min)  Weekly,   STAT  Comments: while on enoxaparin therapy    01/23/23 2017   01/25/23 0500  Hepatic function panel  Tomorrow morning,   R        01/24/23 0845   01/24/23 0839  Urine Culture (for pregnant, neutropenic or urologic patients or patients with an indwelling urinary catheter)  (Urine Labs)  Add-on,   AD       Question:  Indication  Answer:  Bacteriuria screening (OB/GYN or Uro)   01/24/23 0838   01/24/23 0830  CK  Daily,   R      01/24/23 0829   01/24/23 0830  Basic metabolic panel  Daily,   R      01/24/23 0829             Vitals/Pain Today's Vitals   01/24/23 1608 01/24/23 1630 01/24/23 1840 01/24/23 1855  BP: 112/84     Pulse: 77     Resp: 17     Temp: 98.3 F (36.8 C)     TempSrc: Oral     SpO2: 100%     Weight:      Height:      PainSc:  6  7  6      Isolation Precautions No active isolations  Medications Medications  enoxaparin (LOVENOX) injection 40 mg (40 mg Subcutaneous Not Given 01/23/23 2105)  acetaminophen (TYLENOL) tablet 650 mg (has no administration in time range)    Or  acetaminophen (TYLENOL) suppository 650 mg (has no administration in time range)  ondansetron (ZOFRAN) tablet 4 mg (has no administration in time range)    Or  ondansetron (ZOFRAN) injection 4 mg (has no administration in time range)  dextrose 5 %-0.9 % sodium chloride infusion ( Intravenous New Bag/Given 01/24/23 1308)  sulfamethoxazole-trimethoprim (BACTRIM DS) 800-160 MG per tablet 1 tablet (1 tablet Oral Not Given 01/24/23 1015)  oxyCODONE (Oxy IR/ROXICODONE) immediate release tablet 15 mg (15 mg Oral Given 01/24/23 1851)  HYDROmorphone (DILAUDID) injection 2 mg (has no administration in time range)  sodium chloride 0.9 % bolus 1,000 mL (0 mLs Intravenous Stopped 01/23/23 1741)  HYDROmorphone (DILAUDID) injection 1 mg (1 mg Intravenous Given 01/23/23 1553)  oxyCODONE (Oxy IR/ROXICODONE) immediate release tablet 10 mg (10 mg Oral Given 01/23/23 1739)  sodium chloride 0.9 % bolus 1,000 mL (0 mLs Intravenous Stopped 01/23/23 2126)  HYDROmorphone (DILAUDID) injection 1 mg (1 mg Intravenous Given 01/24/23 1627)    Mobility walks     Focused Assessments Cardiac Assessment Handoff:    Lab Results  Component Value Date   CKTOTAL 26,523 (H) 01/24/2023   Lab Results  Component Value Date   DDIMER <0.27 05/22/2019   Does the Patient currently have chest pain? No   , Neuro Assessment Handoff:  Swallow screen pass?  Not Addressed         Neuro Assessment: Within Defined Limits Neuro Checks:      Has  TPA been given? No If patient is a Neuro Trauma and patient is going to OR before floor call report to 4N Charge nurse: [phone] or [phone]   R Recommendations: See Admitting Provider Note  Report given to:   Additional Notes: .

## 2023-01-24 NOTE — ED Notes (Signed)
Pt requesting to see MD. This tech notified RN of pt request.

## 2023-01-24 NOTE — ED Notes (Signed)
Dr. Ashok Pall notified pt. Refused antibiotic and is requesting IV pain meds.

## 2023-01-24 NOTE — ED Notes (Signed)
Dr. Ashok Pall notified pt's most recent CK is [phone].

## 2023-01-24 NOTE — Progress Notes (Addendum)
PROGRESS NOTE    Roberto Stanton  UUV:[id number] DOB: Jul 03, 1995 DOA: 01/23/2023 PCP: [facility]  Outpatient Specialists: neurology    Brief Narrative:   From admission h and p Roberto Stanton is a 27 y.o. male with medical history significant for myophosphorylase deficiency (glycogen storage disease V, McArdle disease), with frequent hospitalizations for exercise-induced painful nontraumatic rhabdomyolysis(past CK peaks up to 90,000), most recently October 2024 who presents to the ED for evaluation of muscle pain in extremities typical of his rhabdo episodes as well as new testicular pain.  He has had no fever or chills and no dysuria or penile discharge. ED course and data review: Tachycardia with otherwise normal vitalsForLabs notable CK 8916--> 21,348 Mild transaminitis with AST 104 and ALT 91 Urinalysis showing rare bacteria large hemoglobin.  GC and Chlamydia PCR negative Scrotal ultrasound showing small left hydrocele, negative for testicular mass or torsion Patient treated with NS boluses, given hydromorphone for pain Hospitalist consulted for admission.    Assessment & Plan:   Principal Problem:   Non-traumatic rhabdomyolysis, recurrent Active Problems:   Myophosphorylase deficiency (glycogen storage disease V, McArdle disease) (HCC)   Transaminitis   Testicular pain   Left hydrocele   Non-traumatic rhabdomyolysis, recurrent McArdle disease-myophosphorylase deficiency/glycogen-storage disease V CK 8916--> 21,348 (history of peaks up to 90,000). Patient continues to have muscle aches IV hydration Will order home oxy for pain control but will increase dose from 10 to 15 Monitor CK, am labs pending   Transaminitis Likely secondary to rhabdo, typical during his episodes Expecting improvement with hydration   Testicular pain Left hydrocele Epididymitis Testicular ultrasound showing no evidence of mass or torsion. Trace bacteria on ua. Is tender  posterior left testicle. G/c negative - f/u urine culture - will treat for possible epididymitis with bactrim but per rn patient has refused it   DVT prophylaxis: lovenox Code Status: full Family Communication: none at bedside  Level of care: Med-Surg Status is: Observation    Consultants:  none  Procedures: none  Antimicrobials:  See above    Subjective: Ongoing aches in muscles of arms and legs and back  Objective: Vitals:   01/24/23 0149 01/24/23 0150 01/24/23 0525 01/24/23 0602  BP:  115/70 104/66   Pulse:  87 79   Resp:  18 18   Temp: 97.8 F (36.6 C)   (!) 97.5 F (36.4 C)  TempSrc: Oral   Axillary  SpO2:  98% 100%   Weight:      Height:        Intake/Output Summary (Last 24 hours) at 01/24/2023 0846 Last data filed at 01/23/2023 2126 Gross per 24 hour  Intake 999 ml  Output --  Net 999 ml   Filed Weights   01/23/23 1145  Weight: 63.5 kg    Examination:  General exam: Appears calm and comfortable  Respiratory system: Clear to auscultation. Respiratory effort normal. Cardiovascular system: S1 & S2 heard, RRR. No JVD, murmurs, rubs, gallops or clicks. No pedal edema. Gastrointestinal system: Abdomen is nondistended, soft and nontender.  Central nervous system: Alert and oriented. No focal neurological deficits. GU: tenderness posterior left testicle Extremities: Symmetric 5 x 5 power. Skin: No rashes, lesions or ulcers Psychiatry: Judgement and insight appear normal. Mood & affect appropriate.     Data Reviewed: I have personally reviewed following labs and imaging studies  CBC: Recent Labs  Lab 01/23/23 1147 01/23/23 2131  WBC 4.1 8.6  NEUTROABS 2.1  --   HGB 14.6 13.1  HCT 42.0 38.3*  MCV 89.7 90.5  PLT 234 200   Basic Metabolic Panel: Recent Labs  Lab 01/23/23 1147 01/23/23 2131  NA 134*  --   K 3.8  --   CL 102  --   CO2 25  --   GLUCOSE 84  --   BUN 10  --   CREATININE 0.75 0.82  CALCIUM 9.2  --    GFR: Estimated  Creatinine Clearance: 120.5 mL/min (by C-G formula based on SCr of 0.82 mg/dL). Liver Function Tests: Recent Labs  Lab 01/23/23 1147  AST 104*  ALT 91*  ALKPHOS 68  BILITOT 0.8  PROT 7.9  ALBUMIN 4.1   No results for input(s): "LIPASE", "AMYLASE" in the last 168 hours. No results for input(s): "AMMONIA" in the last 168 hours. Coagulation Profile: No results for input(s): "INR", "PROTIME" in the last 168 hours. Cardiac Enzymes: Recent Labs  Lab 01/23/23 1147 01/23/23 1732  CKTOTAL 8,916* 21,348*   BNP (last 3 results) No results for input(s): "PROBNP" in the last 8760 hours. HbA1C: No results for input(s): "HGBA1C" in the last 72 hours. CBG: No results for input(s): "GLUCAP" in the last 168 hours. Lipid Profile: No results for input(s): "CHOL", "HDL", "LDLCALC", "TRIG", "CHOLHDL", "LDLDIRECT" in the last 72 hours. Thyroid Function Tests: No results for input(s): "TSH", "T4TOTAL", "FREET4", "T3FREE", "THYROIDAB" in the last 72 hours. Anemia Panel: No results for input(s): "VITAMINB12", "FOLATE", "FERRITIN", "TIBC", "IRON", "RETICCTPCT" in the last 72 hours. Urine analysis:    Component Value Date/Time   COLORURINE YELLOW (A) 01/23/2023 1554   APPEARANCEUR CLEAR (A) 01/23/2023 1554   LABSPEC 1.008 01/23/2023 1554   PHURINE 6.0 01/23/2023 1554   GLUCOSEU NEGATIVE 01/23/2023 1554   HGBUR LARGE (A) 01/23/2023 1554   BILIRUBINUR NEGATIVE 01/23/2023 1554   KETONESUR NEGATIVE 01/23/2023 1554   PROTEINUR NEGATIVE 01/23/2023 1554   NITRITE NEGATIVE 01/23/2023 1554   LEUKOCYTESUR NEGATIVE 01/23/2023 1554   Sepsis Labs: @LABRCNTIP (procalcitonin:4,lacticidven:4)  ) Recent Results (from the past 240 hour(s))  Chlamydia/NGC rt PCR ([facility] only)     Status: None   Collection Time: 01/23/23  3:54 PM   Specimen: Urine, Clean Catch  Result Value Ref Range Status   Specimen source GC/Chlam URINE, RANDOM  Final   Chlamydia Tr NOT DETECTED NOT DETECTED Final   N gonorrhoeae NOT  DETECTED NOT DETECTED Final    Comment: (NOTE) This CT/NG assay has not been evaluated in patients with a history of  hysterectomy. Performed at [facility], [address] [place], [place] [address]          Radiology Studies: US SCROTUM W/DOPPLER  Result Date: 01/23/2023 CLINICAL DATA:  Left testicle pain for 2 days EXAM: SCROTAL ULTRASOUND DOPPLER ULTRASOUND OF THE TESTICLES TECHNIQUE: Complete ultrasound examination of the testicles, epididymis, and other scrotal structures was performed. Color and spectral Doppler ultrasound were also utilized to evaluate blood flow to the testicles. COMPARISON:  None Available. FINDINGS: Right testicle Measurements: 4 x 1.5 x 2.2 cm. Negative for mass. Scattered microcalcifications. Left testicle Measurements: 3.3 x 1.7 x 2.2 cm. Negative for mass. Scattered microcalcifications. Right epididymis:  Normal in size and appearance. Left epididymis:  Normal in size and appearance. Hydrocele:  Small left hydrocele Varicocele:  None visualized. Pulsed Doppler interrogation of both testes demonstrates normal low resistance arterial and venous waveforms bilaterally. IMPRESSION: 1. Negative for testicular mass or torsion. 2. Small left hydrocele. 3. Scattered bilateral testicular microcalcifications. Current literature suggests that testicular microlithiasis is not a  significant independent risk factor for development of testicular carcinoma, and that follow up imaging is not warranted in the absence of other risk factors. Monthly testicular self-examination and annual physical exams are considered appropriate surveillance. If patient has other risk factors for testicular carcinoma, then referral to Urology should be considered. (Reference: DeCastro, et al.: A 5-Year Follow up Study of Asymptomatic Men with Testicular Microlithiasis. J Urol 2008; 179:1420-1423.) Electronically Signed   By: Jasmine Pang M.D.   On: 01/23/2023 17:01        Scheduled  Meds:  enoxaparin (LOVENOX) injection  40 mg Subcutaneous Q24H   sulfamethoxazole-trimethoprim  1 tablet Oral Q12H   Continuous Infusions:  dextrose 5 % and 0.9 % NaCl 125 mL/hr at 01/24/23 0456     LOS: 0 days     Silvano Bilis, MD [facility]   If 7PM-7AM, please contact night-coverage [email or web address] Password [facility] 01/24/2023, 8:46 AM

## 2023-01-25 DIAGNOSIS — M6282 Rhabdomyolysis: Secondary | ICD-10-CM | POA: Diagnosis not present

## 2023-01-25 LAB — URINE CULTURE: Culture: NO GROWTH

## 2023-01-25 LAB — HEPATIC FUNCTION PANEL
ALT: 114 U/L — ABNORMAL HIGH (ref 0–44)
AST: 149 U/L — ABNORMAL HIGH (ref 15–41)
Albumin: 3.7 g/dL (ref 3.5–5.0)
Alkaline Phosphatase: 67 U/L (ref 38–126)
Bilirubin, Direct: <0.1 mg/dL (ref 0.0–0.2)
Total Bilirubin: 0.5 mg/dL (ref <1.2)
Total Protein: 7.2 g/dL (ref 6.5–8.1)

## 2023-01-25 LAB — BASIC METABOLIC PANEL
Anion gap: 8 (ref 5–15)
BUN: 9 mg/dL (ref 6–20)
CO2: 26 mmol/L (ref 22–32)
Calcium: 9.2 mg/dL (ref 8.9–10.3)
Chloride: 102 mmol/L (ref 98–111)
Creatinine, Ser: 0.7 mg/dL (ref 0.61–1.24)
GFR, Estimated: >60 mL/min (ref >60)
Glucose, Bld: 83 mg/dL (ref 70–99)
Potassium: 3.4 mmol/L — ABNORMAL LOW (ref 3.5–5.1)
Sodium: 136 mmol/L (ref 135–145)

## 2023-01-25 LAB — CK: Total CK: 16489 U/L — ABNORMAL HIGH (ref 49–397)

## 2023-01-25 MED ORDER — GLYCERIN (LAXATIVE) 2 G RE SUPP
1.0000 | Freq: Every day | RECTAL | Status: DC | PRN
  Filled 2023-01-25: qty 1

## 2023-01-25 MED ORDER — OXYCODONE HCL 5 MG PO TABS
10.0000 mg | ORAL_TABLET | ORAL | Status: DC | PRN
Start: 2023-01-25 — End: 2023-01-25
  Administered 2023-01-25: 15 mg via ORAL
  Filled 2023-01-25: qty 3

## 2023-01-25 NOTE — Progress Notes (Signed)
Patient did not want to wait for suppository. Patient contacted ride and ride was waiting downstairs. RN removed IV and gave AVS to patient. Patient had not questions or concerns.   Madie Reno, RN

## 2023-01-25 NOTE — Plan of Care (Signed)

## 2023-01-25 NOTE — Discharge Summary (Signed)
Roberto Stanton FAO:[id number] DOB: [birth year] DOA: 01/23/2023  PCP: [facility]  Admit date: 01/23/2023 Discharge date: 01/25/2023  Time spent: 35 minutes  Recommendations for Outpatient Follow-up:  Pcp and neurology f/u     Discharge Diagnoses:  Principal Problem:   Non-traumatic rhabdomyolysis, recurrent Active Problems:   Myophosphorylase deficiency (glycogen storage disease V, McArdle disease) (HCC)   Transaminitis   Chronic pain   Testicular pain   Left hydrocele   Epididymitis   Rhabdomyolysis   Opioid dependence (HCC)   Discharge Condition: improved  Diet recommendation: regular  Filed Weights   01/23/23 1145  Weight: 63.5 kg    History of present illness:  From admission h and p Roberto Stanton is a 27 y.o. male with medical history significant for myophosphorylase deficiency (glycogen storage disease V, McArdle disease), with frequent hospitalizations for exercise-induced painful nontraumatic rhabdomyolysis(past CK peaks up to 90,000), most recently October 2024 who presents to the ED for evaluation of muscle pain in extremities typical of his rhabdo episodes as well as new testicular pain.  He has had no fever or chills and no dysuria or penile discharge.   Hospital Course:  Patient presents with dark urine and muscle aches typical for him for flare of his mcardle disease. This is 11th hospitalization this year for this. Ck peaked at 26k. No aki or hyperkalemia. Patient was treated with IV fluids and pain control. Symptoms improved and patient now requesting d/c home. Patient has developed chronic pain from this syndrome and opioid dependence, I did discuss with him concerns that his increasing reliance on opioids is dangerous and would advise gradual outpatient discontinuation of them, relying on opioids only for acute exacerbations of the disease but not for its chronic pains. Patient also with left posterior testicular pain, u/s showing small  hydrocele. G/c neg, bactrim offered to treat for possible epididymitis but patient declined and by day of discharge symptoms resolved so don't think that's necessary. I did share with patient a paper showing that gradual moderate exercise conditioning can potentially decrease the frequency of mcardle attacks. Advised f/u with [facility] neurologist, patient's disease is clearly uncontrolled and these monthly admissions are not sustainable.   Procedures: none   Consultations: none  Discharge Exam: Vitals:   01/25/23 0332 01/25/23 1009  BP: 124/83 116/77  Pulse: 69 85  Resp: 18 18  Temp: 97.7 F (36.5 C) 98.5 F (36.9 C)  SpO2: 100% 100%    General: NAD Cardiovascular: RRR Respiratory: CTAB  Discharge Instructions   Discharge Instructions     Diet general   Complete by: As directed    Increase activity slowly   Complete by: As directed       Allergies as of 01/25/2023       Reactions   Morphine Hives        Medication List     TAKE these medications    acetaminophen 325 MG tablet Commonly known as: TYLENOL Take 2 tablets (650 mg total) by mouth every 6 (six) hours as needed for mild pain (pain score 1-3) (or Fever >/= 101).   BC HEADACHE POWDER PO Take 1 packet by mouth daily as needed.   naloxone 4 MG/0.1ML Liqd nasal spray kit Commonly known as: NARCAN Place 1 spray into the nose once.   Oxycodone HCl 10 MG Tabs Take 1 tablet (10 mg total) by mouth every 6 (six) hours as needed.   Voltaren 1 % Gel Generic drug: diclofenac Sodium Apply 2 g topically  4 (four) times daily as needed.       Allergies  Allergen Reactions   Morphine Hives    Follow-up Information     [facility] Follow up.   Contact information: [address] [place] [address] [phone]         Your [facility] neurologist Follow up.                   The results of significant diagnostics from this hospitalization (including imaging,  microbiology, ancillary and laboratory) are listed below for reference.    Significant Diagnostic Studies: US SCROTUM W/DOPPLER  Result Date: 01/23/2023 CLINICAL DATA:  Left testicle pain for 2 days EXAM: SCROTAL ULTRASOUND DOPPLER ULTRASOUND OF THE TESTICLES TECHNIQUE: Complete ultrasound examination of the testicles, epididymis, and other scrotal structures was performed. Color and spectral Doppler ultrasound were also utilized to evaluate blood flow to the testicles. COMPARISON:  None Available. FINDINGS: Right testicle Measurements: 4 x 1.5 x 2.2 cm. Negative for mass. Scattered microcalcifications. Left testicle Measurements: 3.3 x 1.7 x 2.2 cm. Negative for mass. Scattered microcalcifications. Right epididymis:  Normal in size and appearance. Left epididymis:  Normal in size and appearance. Hydrocele:  Small left hydrocele Varicocele:  None visualized. Pulsed Doppler interrogation of both testes demonstrates normal low resistance arterial and venous waveforms bilaterally. IMPRESSION: 1. Negative for testicular mass or torsion. 2. Small left hydrocele. 3. Scattered bilateral testicular microcalcifications. Current literature suggests that testicular microlithiasis is not a significant independent risk factor for development of testicular carcinoma, and that follow up imaging is not warranted in the absence of other risk factors. Monthly testicular self-examination and annual physical exams are considered appropriate surveillance. If patient has other risk factors for testicular carcinoma, then referral to Urology should be considered. (Reference: DeCastro, et al.: A 5-Year Follow up Study of Asymptomatic Men with Testicular Microlithiasis. J Urol 2008; 179:1420-1423.) Electronically Signed   By: Jasmine Pang M.D.   On: 01/23/2023 17:01    Microbiology: Recent Results (from the past 240 hour(s))  Chlamydia/NGC rt PCR ([facility] only)     Status: None   Collection Time: 01/23/23  3:54 PM   Specimen: Urine,  Clean Catch  Result Value Ref Range Status   Specimen source GC/Chlam URINE, RANDOM  Final   Chlamydia Tr NOT DETECTED NOT DETECTED Final   N gonorrhoeae NOT DETECTED NOT DETECTED Final    Comment: (NOTE) This CT/NG assay has not been evaluated in patients with a history of  hysterectomy. Performed at [facility], [address] [place], [place] [address]      Labs: Basic Metabolic Panel: Recent Labs  Lab 01/23/23 1147 01/23/23 2131 01/24/23 0843 01/25/23 0508  NA 134*  --  138 136  K 3.8  --  3.5 3.4*  CL 102  --  103 102  CO2 25  --  27 26  GLUCOSE 84  --  70 83  BUN 10  --  6 9  CREATININE 0.75 0.82 0.74 0.70  CALCIUM 9.2  --  8.5* 9.2   Liver Function Tests: Recent Labs  Lab 01/23/23 1147 01/25/23 0508  AST 104* 149*  ALT 91* 114*  ALKPHOS 68 67  BILITOT 0.8 0.5  PROT 7.9 7.2  ALBUMIN 4.1 3.7   No results for input(s): "LIPASE", "AMYLASE" in the last 168 hours. No results for input(s): "AMMONIA" in the last 168 hours. CBC: Recent Labs  Lab 01/23/23 1147 01/23/23 2131  WBC 4.1 8.6  NEUTROABS 2.1  --   HGB 14.6 13.1  HCT 42.0 38.3*  MCV 89.7 90.5  PLT 234 200   Cardiac Enzymes: Recent Labs  Lab 01/23/23 1147 01/23/23 1732 01/24/23 0843 01/25/23 0508  CKTOTAL 8,916* 21,348* 81,191* 16,489*   BNP: BNP (last 3 results) No results for input(s): "BNP" in the last 8760 hours.  ProBNP (last 3 results) No results for input(s): "PROBNP" in the last 8760 hours.  CBG: No results for input(s): "GLUCAP" in the last 168 hours.     Signed:  Silvano Bilis MD.  [facility] 01/25/2023, 10:37 AM

## 2023-03-03 NOTE — Care Plan (Signed)
 Patient to discharge from program today. Plan reviewed with patient and he agrees with plan. Problem: Adult Inpatient Plan of Care Goal: Plan of Care Review Outcome: Progressing Flowsheets (Taken 03/03/2023 1155) Progress: improving Plan of Care Reviewed With: patient Goal: Optimal Comfort and Wellbeing Outcome: Progressing

## 2023-06-22 ENCOUNTER — Inpatient Hospital Stay
Admission: EM | Admit: 2023-06-22 | Discharge: 2023-06-25 | DRG: 558 | Disposition: A | Discharge status: Home / Self Care | Attending: Internal Medicine | Admitting: Internal Medicine

## 2023-06-22 ENCOUNTER — Encounter: Payer: Self-pay | Admitting: Emergency Medicine

## 2023-06-22 DIAGNOSIS — Z87891 Personal history of nicotine dependence: Secondary | ICD-10-CM

## 2023-06-22 DIAGNOSIS — R7401 Elevation of levels of liver transaminase levels: Secondary | ICD-10-CM | POA: Diagnosis present

## 2023-06-22 DIAGNOSIS — Z885 Allergy status to narcotic agent status: Secondary | ICD-10-CM | POA: Diagnosis not present

## 2023-06-22 DIAGNOSIS — M791 Myalgia, unspecified site: Secondary | ICD-10-CM | POA: Diagnosis present

## 2023-06-22 DIAGNOSIS — K047 Periapical abscess without sinus: Secondary | ICD-10-CM | POA: Diagnosis present

## 2023-06-22 DIAGNOSIS — M6282 Rhabdomyolysis: Principal | ICD-10-CM | POA: Diagnosis present

## 2023-06-22 DIAGNOSIS — E7404 McArdle disease: Secondary | ICD-10-CM | POA: Diagnosis present

## 2023-06-22 DIAGNOSIS — Z8249 Family history of ischemic heart disease and other diseases of the circulatory system: Secondary | ICD-10-CM | POA: Diagnosis not present

## 2023-06-22 DIAGNOSIS — I517 Cardiomegaly: Secondary | ICD-10-CM | POA: Diagnosis present

## 2023-06-22 DIAGNOSIS — Z79899 Other long term (current) drug therapy: Secondary | ICD-10-CM | POA: Diagnosis not present

## 2023-06-22 DIAGNOSIS — K029 Dental caries, unspecified: Secondary | ICD-10-CM | POA: Diagnosis present

## 2023-06-22 DIAGNOSIS — Z87442 Personal history of urinary calculi: Secondary | ICD-10-CM | POA: Diagnosis not present

## 2023-06-22 LAB — URINALYSIS, W/ REFLEX TO CULTURE (INFECTION SUSPECTED)
Bacteria, UA: NONE SEEN
Bilirubin Urine: NEGATIVE
Glucose, UA: NEGATIVE mg/dL
Ketones, ur: NEGATIVE mg/dL
Leukocytes,Ua: NEGATIVE
Nitrite: NEGATIVE
Protein, ur: 30 mg/dL — AB
Specific Gravity, Urine: 1.009 (ref 1.005–1.030)
Squamous Epithelial / HPF: 0 /HPF (ref 0–5)
pH: 6 (ref 5.0–8.0)

## 2023-06-22 LAB — CBC
HCT: 44.1 % (ref 39.0–52.0)
Hemoglobin: 15.5 g/dL (ref 13.0–17.0)
MCH: 31.6 pg (ref 26.0–34.0)
MCHC: 35.1 g/dL (ref 30.0–36.0)
MCV: 89.8 fL (ref 80.0–100.0)
Platelets: 226 10*3/uL (ref 150–400)
RBC: 4.91 MIL/uL (ref 4.22–5.81)
RDW: 12.8 % (ref 11.5–15.5)
WBC: 5.7 10*3/uL (ref 4.0–10.5)
nRBC: 0 % (ref 0.0–0.2)

## 2023-06-22 LAB — BASIC METABOLIC PANEL WITH GFR
Anion gap: 9 (ref 5–15)
Anion gap: 6 (ref 5–15)
BUN: 14 mg/dL (ref 6–20)
BUN: 13 mg/dL (ref 6–20)
CO2: 27 mmol/L (ref 22–32)
CO2: 25 mmol/L (ref 22–32)
Calcium: 9.1 mg/dL (ref 8.9–10.3)
Calcium: 8.6 mg/dL — ABNORMAL LOW (ref 8.9–10.3)
Chloride: 100 mmol/L (ref 98–111)
Chloride: 106 mmol/L (ref 98–111)
Creatinine, Ser: 0.79 mg/dL (ref 0.61–1.24)
Creatinine, Ser: 0.69 mg/dL (ref 0.61–1.24)
GFR, Estimated: >60 mL/min (ref >60)
GFR, Estimated: >60 mL/min (ref >60)
Glucose, Bld: 92 mg/dL (ref 70–99)
Glucose, Bld: 82 mg/dL (ref 70–99)
Potassium: 3.7 mmol/L (ref 3.5–5.1)
Potassium: 3.8 mmol/L (ref 3.5–5.1)
Sodium: 136 mmol/L (ref 135–145)
Sodium: 137 mmol/L (ref 135–145)

## 2023-06-22 LAB — CK
Total CK: 4800 U/L — ABNORMAL HIGH (ref 49–397)
Total CK: >20000 U/L — ABNORMAL HIGH (ref 49–397)

## 2023-06-22 LAB — LIPASE, BLOOD: Lipase: 26 U/L (ref 11–51)

## 2023-06-22 LAB — HEPATIC FUNCTION PANEL
ALT: 72 U/L — ABNORMAL HIGH (ref 0–44)
AST: 68 U/L — ABNORMAL HIGH (ref 15–41)
Albumin: 4.1 g/dL (ref 3.5–5.0)
Alkaline Phosphatase: 77 U/L (ref 38–126)
Bilirubin, Direct: <0.1 mg/dL (ref 0.0–0.2)
Total Bilirubin: 0.8 mg/dL (ref 0.0–1.2)
Total Protein: 7.9 g/dL (ref 6.5–8.1)

## 2023-06-22 LAB — MAGNESIUM: Magnesium: 2.2 mg/dL (ref 1.7–2.4)

## 2023-06-22 MED ORDER — LACTATED RINGERS IV BOLUS
1000.0000 mL | Freq: Once | INTRAVENOUS | Status: AC
Start: 2023-06-22 — End: 2023-06-22
  Administered 2023-06-22: 1000 mL via INTRAVENOUS

## 2023-06-22 MED ORDER — SODIUM CHLORIDE 0.9 % IV BOLUS
1000.0000 mL | Freq: Once | INTRAVENOUS | Status: AC
  Administered 2023-06-22: 1000 mL via INTRAVENOUS

## 2023-06-22 MED ORDER — OXYCODONE HCL 5 MG PO TABS
10.0000 mg | ORAL_TABLET | Freq: Four times a day (QID) | ORAL | Status: DC | PRN
  Administered 2023-06-22: 10 mg via ORAL
  Filled 2023-06-22: qty 2

## 2023-06-22 MED ORDER — OXYCODONE HCL 5 MG PO TABS
20.0000 mg | ORAL_TABLET | ORAL | Status: DC | PRN
  Administered 2023-06-22 – 2023-06-25 (×12): 20 mg via ORAL
  Filled 2023-06-22 (×13): qty 4

## 2023-06-22 MED ORDER — OXYCODONE HCL 5 MG PO TABS: 5.0000 mg | ORAL_TABLET | Freq: Four times a day (QID) | ORAL | Status: DC | PRN

## 2023-06-22 MED ORDER — IBUPROFEN 400 MG PO TABS: 200.0000 mg | ORAL_TABLET | Freq: Four times a day (QID) | ORAL | Status: DC | PRN

## 2023-06-22 MED ORDER — ENOXAPARIN SODIUM 40 MG/0.4ML IJ SOSY
40.0000 mg | PREFILLED_SYRINGE | INTRAMUSCULAR | Status: DC
  Filled 2023-06-22: qty 0.4

## 2023-06-22 MED ORDER — OXYCODONE HCL 5 MG PO TABS: 10.0000 mg | ORAL_TABLET | ORAL | Status: DC | PRN

## 2023-06-22 MED ORDER — ONDANSETRON HCL 4 MG/2ML IJ SOLN: 4.0000 mg | Freq: Three times a day (TID) | INTRAMUSCULAR | Status: DC | PRN

## 2023-06-22 MED ORDER — SODIUM BICARBONATE 8.4 % IV SOLN
Freq: Once | INTRAVENOUS | Status: DC
  Filled 2023-06-22: qty 150

## 2023-06-22 MED ORDER — LACTATED RINGERS IV SOLN: INTRAVENOUS | Status: AC

## 2023-06-22 MED ORDER — OXYCODONE HCL 5 MG PO TABS: 20.0000 mg | ORAL_TABLET | ORAL | Status: DC | PRN

## 2023-06-22 MED ORDER — DOXYCYCLINE HYCLATE 100 MG PO TABS
100.0000 mg | ORAL_TABLET | Freq: Once | ORAL | Status: AC
  Administered 2023-06-22: 100 mg via ORAL
  Filled 2023-06-22: qty 1

## 2023-06-22 MED ORDER — AMOXICILLIN-POT CLAVULANATE 875-125 MG PO TABS
1.0000 | ORAL_TABLET | Freq: Two times a day (BID) | ORAL | Status: DC
Start: 2023-06-22 — End: 2023-06-24
  Administered 2023-06-22 – 2023-06-23 (×3): 1 via ORAL
  Filled 2023-06-22 (×4): qty 1

## 2023-06-22 NOTE — ED Provider Notes (Signed)
 [facility] Provider Note    Event Date/Time   First MD Initiated Contact with Patient 06/22/23 1513     (approximate)   History   Chief Complaint: Dizziness   HPI  JEMIAH BASSIN is a 28 y.o. male with a history of glycogen-storage disease who comes to the ED complaining of diffuse body aches starting yesterday in the setting of doing work as a Education administrator recently and having a lot of time outside in the sun in warm weather.  No vomiting or fever, no diarrhea.  No chest pain or trouble breathing.        Past Medical History:  Diagnosis Date   Kidney stones    McArdle disease (HCC) 09/28/2021   Pericarditis    Rhabdomyolysis     Current Outpatient Rx   Order #: [id number] Class: Normal   Order #: [id number] Class: Historical Med   Order #: [id number] Class: Historical Med   Order #: [id number] Class: Historical Med   Order #: [id number] Class: Normal    Past Surgical History:  Procedure Laterality Date   leg muscle biopsy      Physical Exam   Triage Vital Signs: ED Triage Vitals  Encounter Vitals Group     BP 06/22/23 1333 135/87     Systolic BP Percentile --      Diastolic BP Percentile --      Pulse Rate 06/22/23 1333 (!) 133     Resp 06/22/23 1333 18     Temp 06/22/23 1333 98.1 F (36.7 C)     Temp Source 06/22/23 1340 Oral     SpO2 06/22/23 1333 99 %     Weight --      Height --      Head Circumference --      Peak Flow --      Pain Score 06/22/23 1340 7     Pain Loc --      Pain Education --      Exclude from Growth Chart --     Most recent vital signs: Vitals:   06/22/23 1805 06/22/23 1820  BP:  (!) 119/99  Pulse:  (!) 102  Resp:  16  Temp: 98.5 F (36.9 C)   SpO2:  100%    General: Awake, no distress.  CV:  Good peripheral perfusion.  Tachycardia heart rate 110 Resp:  Normal effort.  Clear to auscultation bilaterally Abd:  No distention.  Soft with mild generalized tenderness Other:  Compartments soft.   ED  Results / Procedures / Treatments   Labs (all labs ordered are listed, but only abnormal results are displayed) Labs Reviewed  CK - Abnormal; Notable for the following components:      Result Value   Total CK 4,800 (*)    All other components within normal limits  HEPATIC FUNCTION PANEL - Abnormal; Notable for the following components:   AST 68 (*)    ALT 72 (*)    All other components within normal limits  BASIC METABOLIC PANEL WITH GFR - Abnormal; Notable for the following components:   Calcium 8.6 (*)    All other components within normal limits  CK - Abnormal; Notable for the following components:   Total CK >20,000 (*)    All other components within normal limits  URINALYSIS, W/ REFLEX TO CULTURE (INFECTION SUSPECTED) - Abnormal; Notable for the following components:   Color, Urine YELLOW (*)    APPearance CLEAR (*)    Hgb urine dipstick LARGE (*)  Protein, ur 30 (*)    All other components within normal limits  CBC  BASIC METABOLIC PANEL WITH GFR  MAGNESIUM   LIPASE, BLOOD     EKG Sinus tachycardia rate 135.  Right axis, normal intervals.  No acute ischemic changes   RADIOLOGY    PROCEDURES:  Procedures   MEDICATIONS ORDERED IN ED: Medications  lactated ringers  infusion (has no administration in time range)  ondansetron  (ZOFRAN ) injection 4 mg (has no administration in time range)  enoxaparin  (LOVENOX ) injection 40 mg (has no administration in time range)  oxyCODONE  (Oxy IR/ROXICODONE ) immediate release tablet 20 mg (has no administration in time range)  amoxicillin -clavulanate (AUGMENTIN ) 875-125 MG per tablet 1 tablet (has no administration in time range)  ibuprofen  (ADVIL ) tablet 200 mg (has no administration in time range)  sodium chloride  0.9 % bolus 1,000 mL (0 mLs Intravenous Stopped 06/22/23 1513)  lactated ringers  bolus 1,000 mL (0 mLs Intravenous Stopped 06/22/23 1747)  doxycycline (VIBRA-TABS) tablet 100 mg (100 mg Oral Given 06/22/23 1559)      IMPRESSION / MDM / ASSESSMENT AND PLAN / ED COURSE  I reviewed the triage vital signs and the nursing notes.  DDx: Dehydration, AKI, electrolyte derangement, rhabdomyolysis, pancreatitis  Patient's presentation is most consistent with acute presentation with potential threat to life or bodily function.  Patient presents with diffuse muscle aches in the setting of glycogen storage disease and scenario likely dehydration.  Initial labs showed normal creatinine and electrolytes but CK is 4800.  He notes that when this happens it usually escalates for a few days before getting better and he thinks he is on the early side.  He is already had 1 L saline.  Will give an additional liter of LR and start a bicarb drip, plan to admit for further management. Will continue his usual oxycodone  10mg  PO q6h prn  He also complains of right lower jaw dental pain, has multiple decayed teeth.  Will start antibiotics - doxycycline.  ----------------------------------------- 9:50 PM on 06/22/2023 ----------------------------------------- Repeat labs show BMP remains normal but CK has increased to greater than 20,000.  Patient's leg pain is also escalating.  Compartments remain soft.  Case discussed with hospitalist for further management      FINAL CLINICAL IMPRESSION(S) / ED DIAGNOSES   Final diagnoses:  Non-traumatic rhabdomyolysis  Glycogen storage disease type 5 (HCC)     Rx / DC Orders   ED Discharge Orders     None        Note:  This document was prepared using Dragon voice recognition software and may include unintentional dictation errors.   Jacquie Maudlin, MD 06/22/23 2150

## 2023-06-22 NOTE — ED Notes (Signed)
 Pt ambulatory in hallway NAD

## 2023-06-22 NOTE — ED Provider Triage Note (Signed)
 Emergency Medicine Provider Triage Evaluation Note  Roberto Stanton , a 28 y.o. male  was evaluated in triage.  Pt complains of generalized muscle pain since this morning.  Has McArdle's disease.  Gets rhabdomyolysis fairly easily..  Review of Systems  Positive:  Negative:   Physical Exam  BP 135/87   Pulse (!) 133   Temp 98.1 F (36.7 C) (Oral)   Resp 18   SpO2 99%  Gen:   Awake, no distress   Resp:  Normal effort  MSK:   Moves extremities without difficulty  Other:    Medical Decision Making  Medically screening exam initiated at 1:42 PM.  Appropriate orders placed.  Roberto Stanton was informed that the remainder of the evaluation will be completed by another provider, this initial triage assessment does not replace that evaluation, and the importance of remaining in the ED until their evaluation is complete.     Delsie Figures, PA-C 06/22/23 1343

## 2023-06-22 NOTE — H&P (Signed)
 History and Physical    ARMAHN TRIERWEILER ZOX:[id number] DOB: 01-05-1996 DOA: 06/22/2023  Referring MD/NP/PA:   PCP: [facility]   Patient coming from:  The patient is coming from home.     Chief Complaint: Muscle pain, dental pain  HPI: Roberto Stanton is a 28 y.o. male with medical history significant of rhabdomyolysis, myophosphorylase deficiency (glycogen storage disease V, McArdle disease), transaminitis, pericarditis, kidney stone, marijuana use, former smoker, who presents with muscle pain and dental pain.  Patient states that he started having leg muscle pain, body aches, lower back pain since this morning, which has been progressively worsening.  The muscle pain and leg is severe, constant, nonradiating, not aggravated or alleviated by any known factors.  He reports that he went to cruise and exposed to heat outside recently.  No chest pain, cough, SOB.  No nausea, vomiting, diarrhea or abdominal pain.  No symptoms of UTI.  He also reports dental pain, with multiple decayed teeth in the right lower jaw.  No fever or chills.  Data reviewed independently and ED Course: pt was found to have CK 4800 --> more than 20,000 on repeated lab, liver function (ALP 77, AST 68, ALT 72, total bilirubin 0.8), potassium 3.8, magnesium  2.2, negative UA, temperature normal, blood pressure 119/99, heart rate 133 --> 102, 18, oxygen saturation 100% on room air.  Patient is admitted to MedSurg bed as inpatient.   EKG: I have personally reviewed.  Sinus rhythm, QTc 456, heart rate of 135, bilateral atrial enlargement, LVH.   Review of Systems:   General: no fevers, chills, no body weight gain, has fatigue HEENT: no blurry vision, hearing changes or sore throat. Has dental pain Respiratory: no dyspnea, coughing, wheezing CV: no chest pain, no palpitations GI: no nausea, vomiting, abdominal pain, diarrhea, constipation GU: no dysuria, burning on urination, increased urinary frequency,  hematuria  Ext: no leg edema Neuro: no unilateral weakness, numbness, or tingling, no vision change or hearing loss Skin: no rash, no skin tear. MSK: Has leg muscle pain and lower back pain Heme: No easy bruising.  Travel history: No recent long distant travel.   Allergy:  Allergies  Allergen Reactions   Morphine  Hives    Past Medical History:  Diagnosis Date   Kidney stones    McArdle disease (HCC) 09/28/2021   Pericarditis    Rhabdomyolysis     Past Surgical History:  Procedure Laterality Date   leg muscle biopsy      Social History:  reports that he has quit smoking. His smoking use included cigarettes. He has never used smokeless tobacco. He reports current drug use. Drug: Marijuana. He reports that he does not drink alcohol.  Family History:  Family History  Problem Relation Age of Onset   Heart disease Father      Prior to Admission medications   Medication Sig Start Date End Date Taking? Authorizing Provider  acetaminophen  (TYLENOL ) 325 MG tablet Take 2 tablets (650 mg total) by mouth every 6 (six) hours as needed for mild pain (pain score 1-3) (or Fever >/= 101). 12/15/22   Ezzard Holms, MD  Aspirin -Salicylamide-Caffeine (BC HEADACHE POWDER PO) Take 1 packet by mouth daily as needed.    [provider]  diclofenac Sodium (VOLTAREN) 1 % GEL Apply 2 g topically 4 (four) times daily as needed.    [provider]  naloxone  (NARCAN ) nasal spray 4 mg/0.1 mL Place 1 spray into the nose once.    [provider]  Oxycodone  HCl 10 MG TABS Take 1 tablet (10 mg total) by mouth every 6 (six) hours as needed. 12/02/22   Sheril Dines, MD    Physical Exam: Vitals:   06/22/23 1340 06/22/23 1512 06/22/23 1805 06/22/23 1820  BP:  134/83  (!) 119/99  Pulse:  (!) 101  (!) 102  Resp:  16  16  Temp: 98.1 F (36.7 C)  98.5 F (36.9 C)   TempSrc: Oral     SpO2:  100%  100%  Weight:   63.5 kg   Height:   5\' 9"  (1.753 m)    General: Not in acute  distress HEENT: Has has multiple decayed teeth in right lower jaw        eyes: PERRL, EOMI, no jaundice       ENT: No discharge from the ears and nose, no pharynx injection, no tonsillar enlargement.        Neck: No JVD, no bruit, no mass felt. Heme: No neck lymph node enlargement. Cardiac: S1/S2, RRR, No murmurs, No gallops or rubs. Respiratory: No rales, wheezing, rhonchi or rubs. GI: Soft, nondistended, nontender, no rebound pain, no organomegaly, BS present. GU: No hematuria Ext: No pitting leg edema bilaterally. 1+DP/PT pulse bilaterally. Musculoskeletal: No joint deformities, No joint redness or warmth, no limitation of ROM in spin.  Has tenderness in both legs. Skin: No rashes.  Neuro: Alert, oriented X3, cranial nerves II-XII grossly intact, moves all extremities normally. Psych: Patient is not psychotic, no suicidal or hemocidal ideation.  Labs on Admission: I have personally reviewed following labs and imaging studies  CBC: Recent Labs  Lab 06/22/23 1335  WBC 5.7  HGB 15.5  HCT 44.1  MCV 89.8  PLT 226   Basic Metabolic Panel: Recent Labs  Lab 06/22/23 1335 06/22/23 1809  NA 136 137  K 3.7 3.8  CL 100 106  CO2 27 25  GLUCOSE 92 82  BUN 14 13  CREATININE 0.79 0.69  CALCIUM 9.1 8.6*  MG 2.2  --    GFR: Estimated Creatinine Clearance: 122.4 mL/min (by C-G formula based on SCr of 0.69 mg/dL). Liver Function Tests: Recent Labs  Lab 06/22/23 1341  AST 68*  ALT 72*  ALKPHOS 77  BILITOT 0.8  PROT 7.9  ALBUMIN 4.1   Recent Labs  Lab 06/22/23 1341  LIPASE 26   No results for input(s): "AMMONIA" in the last 168 hours. Coagulation Profile: No results for input(s): "INR", "PROTIME" in the last 168 hours. Cardiac Enzymes: Recent Labs  Lab 06/22/23 1335 06/22/23 1809  CKTOTAL 4,800* >20,000*   BNP (last 3 results) No results for input(s): "PROBNP" in the last 8760 hours. HbA1C: No results for input(s): "HGBA1C" in the last 72 hours. CBG: No  results for input(s): "GLUCAP" in the last 168 hours. Lipid Profile: No results for input(s): "CHOL", "HDL", "LDLCALC", "TRIG", "CHOLHDL", "LDLDIRECT" in the last 72 hours. Thyroid  Function Tests: No results for input(s): "TSH", "T4TOTAL", "FREET4", "T3FREE", "THYROIDAB" in the last 72 hours. Anemia Panel: No results for input(s): "VITAMINB12", "FOLATE", "FERRITIN", "TIBC", "IRON", "RETICCTPCT" in the last 72 hours. Urine analysis:    Component Value Date/Time   COLORURINE YELLOW (A) 06/22/2023 1808   APPEARANCEUR CLEAR (A) 06/22/2023 1808   LABSPEC 1.009 06/22/2023 1808   PHURINE 6.0 06/22/2023 1808   GLUCOSEU NEGATIVE 06/22/2023 1808   HGBUR LARGE (A) 06/22/2023 1808   BILIRUBINUR NEGATIVE 06/22/2023 1808   KETONESUR NEGATIVE 06/22/2023 1808   PROTEINUR 30 (A) 06/22/2023 1808  NITRITE NEGATIVE 06/22/2023 1808   LEUKOCYTESUR NEGATIVE 06/22/2023 1808   Sepsis Labs: @LABRCNTIP (procalcitonin:4,lacticidven:4) )No results found for this or any previous visit (from the past 240 hours).   Radiological Exams on Admission:   Assessment/Plan Principal Problem:   Rhabdomyolysis Active Problems:   Myophosphorylase deficiency (glycogen storage disease V, McArdle disease) (HCC)   Transaminitis   Dental infection   Assessment and Plan:  Rhabdomyolysis in the setting of McArdle disease: Patient has recurrent nontraumatic rhabdomyolysis with CK up to > 20, 0000.  Renal function normal.  Has mild abnormal liver function.  - will admit to med-Surg bed as inpt - IVF: received 1L LR and 1L NS in ED, will continue at 125 cc/h of NS - will check CK daily - Pain control: As needed oxycodone  and low-dose ibuprofen   Myophosphorylase deficiency (glycogen storage disease V, McArdle disease) (HCC) -see above  Transaminitis: Patient has mild abnormal liver function, which is likely due to rhabdomyolysis. - Avoid using Tylenol   Dental infection - Start Augmentin  (patient received 1 dose of  doxycycline in ED)     DVT ppx: SQ Lovenox   Code Status: Full code   Family Communication:     not done, no family member is at bed side.       Disposition Plan:  Anticipate discharge back to previous environment  Consults called:  none  Admission status and Level of care: Med-Surg: as inpt        Dispo: The patient is from: Home              Anticipated d/c is to: Home              Anticipated d/c date is: 2 days              Patient currently is not medically stable to d/c.    Severity of Illness:  The appropriate patient status for this patient is INPATIENT. Inpatient status is judged to be reasonable and necessary in order to provide the required intensity of service to ensure the patient's safety. The patient's presenting symptoms, physical exam findings, and initial radiographic and laboratory data in the context of their chronic comorbidities is felt to place them at high risk for further clinical deterioration. Furthermore, it is not anticipated that the patient will be medically stable for discharge from the hospital within 2 midnights of admission.   * I certify that at the point of admission it is my clinical judgment that the patient will require inpatient hospital care spanning beyond 2 midnights from the point of admission due to high intensity of service, high risk for further deterioration and high frequency of surveillance required.*       Date of Service 06/22/2023    Fidencio Hue [facility]   If 7PM-7AM, please contact night-coverage [email or web address] 06/22/2023, 9:35 PM

## 2023-06-22 NOTE — ED Triage Notes (Signed)
 Pt to ED from home with dizziness and generalized muscle pain since this morning. Pt states he is having a flair up of prior dx muscle disease. Pt also endorses pain his mouth.   Hx Rhabdomylosis

## 2023-06-23 ENCOUNTER — Other Ambulatory Visit: Payer: Self-pay

## 2023-06-23 DIAGNOSIS — M6282 Rhabdomyolysis: Secondary | ICD-10-CM | POA: Diagnosis not present

## 2023-06-23 DIAGNOSIS — E7404 McArdle disease: Secondary | ICD-10-CM | POA: Diagnosis not present

## 2023-06-23 LAB — BASIC METABOLIC PANEL WITH GFR
Anion gap: 8 (ref 5–15)
BUN: 9 mg/dL (ref 6–20)
CO2: 26 mmol/L (ref 22–32)
Calcium: 8.2 mg/dL — ABNORMAL LOW (ref 8.9–10.3)
Chloride: 103 mmol/L (ref 98–111)
Creatinine, Ser: 0.67 mg/dL (ref 0.61–1.24)
GFR, Estimated: >60 mL/min (ref >60)
Glucose, Bld: 114 mg/dL — ABNORMAL HIGH (ref 70–99)
Potassium: 3.6 mmol/L (ref 3.5–5.1)
Sodium: 137 mmol/L (ref 135–145)

## 2023-06-23 LAB — CK: Total CK: >20000 U/L — ABNORMAL HIGH (ref 49–397)

## 2023-06-23 LAB — MAGNESIUM: Magnesium: 1.8 mg/dL (ref 1.7–2.4)

## 2023-06-23 NOTE — ED Notes (Signed)
 IVF's paused for transport.

## 2023-06-23 NOTE — ED Notes (Signed)
 Patient requested a cola.

## 2023-06-23 NOTE — Progress Notes (Signed)
 1313 Per MD pt ok to shower.

## 2023-06-23 NOTE — Progress Notes (Signed)
  Progress Note   Patient: Roberto Stanton UXL:[id number] DOB: October 04, 1995 DOA: 06/22/2023     1 DOS: the patient was seen and examined on 06/23/2023   Brief hospital course:  28 year old with history of myophosphorylase deficiency (glycogen-storage disease V, previously McArdle's disease), rhabdomyolysis, presenting with muscle pain secondary to extraneous outside activities.  Assessment and Plan:  Acute rhabdomyolysis secondary to underlying myophosphorylase deficiency - CK showing marked uptrend up to greater than 20,000.  Renal function normal for now.  Mild transaminitis secondary to rhabdo.  Aggressive IV fluid hydration on board.  Dental infection - Augmentin  on board.  Subjective: Patient resting comfortably this morning.  States he is feeling a little bit better.  Still has diffuse muscle soreness but otherwise improved.  Denies any fever, chills, chest pain, nausea, vomiting, abdominal pain, shortness of breath.  Physical Exam: Vitals:   06/23/23 0835 06/23/23 0954 06/23/23 1247 06/23/23 1308  BP:  125/73 118/62 117/77  Pulse:  67 64 83  Resp:  16 16 16   Temp: 97.9 F (36.6 C) 97.9 F (36.6 C) 97.7 F (36.5 C) 98.3 F (36.8 C)  TempSrc: Oral Oral Oral Oral  SpO2:  96% 100% 100%  Weight:      Height:        GENERAL:  Alert, pleasant, no acute distress  HEENT:  EOMI CARDIOVASCULAR:  RRR, no murmurs appreciated RESPIRATORY:  Clear to auscultation, no wheezing, rales, or rhonchi GASTROINTESTINAL:  Soft, nontender, nondistended EXTREMITIES:  No LE edema bilaterally NEURO:  No new focal deficits appreciated SKIN:  No rashes noted PSYCH:  Appropriate mood and affect     Data Reviewed:  No new imaging to review  Labs: CBC: Recent Labs  Lab 06/22/23 1335  WBC 5.7  HGB 15.5  HCT 44.1  MCV 89.8  PLT 226   Basic Metabolic Panel: Recent Labs  Lab 06/22/23 1335 06/22/23 1809 06/23/23 0839  NA 136 137 137  K 3.7 3.8 3.6  CL 100 106 103  CO2 27 25 26    GLUCOSE 92 82 114*  BUN 14 13 9   CREATININE 0.79 0.69 0.67  CALCIUM 9.1 8.6* 8.2*  MG 2.2  --  1.8   Liver Function Tests: Recent Labs  Lab 06/22/23 1341  AST 68*  ALT 72*  ALKPHOS 77  BILITOT 0.8  PROT 7.9  ALBUMIN 4.1   CBG: No results for input(s): "GLUCAP" in the last 168 hours.  Scheduled Meds:  amoxicillin -clavulanate  1 tablet Oral Q12H   enoxaparin  (LOVENOX ) injection  40 mg Subcutaneous Q24H   Continuous Infusions:  lactated ringers  Stopped (06/23/23 1248)   PRN Meds:.ibuprofen , ondansetron  (ZOFRAN ) IV, oxyCODONE   Family Communication: None at bedside  Disposition: Status is: Inpatient Remains inpatient appropriate because: Severe rhabdomyolysis     Time spent: 33 minutes  Author: Jodeane Mulligan, DO 06/23/2023 3:41 PM  For on call review [email or web address].

## 2023-06-23 NOTE — ED Notes (Signed)
 Advised nurse that patient has ready bed

## 2023-06-23 NOTE — Hospital Course (Signed)
 28 year old with history of myophosphorylase deficiency (glycogen-storage disease V, previously McArdle's disease), rhabdomyolysis, presenting with muscle pain secondary to extraneous outside activities.   Assessment and Plan:   Acute rhabdomyolysis secondary to underlying myophosphorylase deficiency - CK showing marked uptrend up to greater than 20,000.  Renal function normal for now.  Mild transaminitis secondary to rhabdo.  Aggressive IV fluid hydration on board.   Dental infection - Augmentin  on board.

## 2023-06-24 DIAGNOSIS — E7404 McArdle disease: Secondary | ICD-10-CM | POA: Diagnosis not present

## 2023-06-24 DIAGNOSIS — M6282 Rhabdomyolysis: Secondary | ICD-10-CM | POA: Diagnosis not present

## 2023-06-24 DIAGNOSIS — K047 Periapical abscess without sinus: Secondary | ICD-10-CM | POA: Diagnosis not present

## 2023-06-24 LAB — BASIC METABOLIC PANEL WITH GFR
Anion gap: 9 (ref 5–15)
BUN: 10 mg/dL (ref 6–20)
CO2: 27 mmol/L (ref 22–32)
Calcium: 9 mg/dL (ref 8.9–10.3)
Chloride: 102 mmol/L (ref 98–111)
Creatinine, Ser: 0.66 mg/dL (ref 0.61–1.24)
GFR, Estimated: >60 mL/min (ref >60)
Glucose, Bld: 103 mg/dL — ABNORMAL HIGH (ref 70–99)
Potassium: 3.5 mmol/L (ref 3.5–5.1)
Sodium: 138 mmol/L (ref 135–145)

## 2023-06-24 LAB — CBC
HCT: 42.2 % (ref 39.0–52.0)
Hemoglobin: 14.3 g/dL (ref 13.0–17.0)
MCH: 30.5 pg (ref 26.0–34.0)
MCHC: 33.9 g/dL (ref 30.0–36.0)
MCV: 90 fL (ref 80.0–100.0)
Platelets: 196 10*3/uL (ref 150–400)
RBC: 4.69 MIL/uL (ref 4.22–5.81)
RDW: 12.7 % (ref 11.5–15.5)
WBC: 5.5 10*3/uL (ref 4.0–10.5)
nRBC: 0 % (ref 0.0–0.2)

## 2023-06-24 LAB — CK: Total CK: >20000 U/L — ABNORMAL HIGH (ref 49–397)

## 2023-06-24 MED ORDER — AMOXICILLIN-POT CLAVULANATE 250-62.5 MG/5ML PO SUSR
500.0000 mg | Freq: Three times a day (TID) | ORAL | Status: DC
  Filled 2023-06-24: qty 10

## 2023-06-24 MED ORDER — AMOXICILLIN-POT CLAVULANATE 400-57 MG/5ML PO SUSR
500.0000 mg | Freq: Three times a day (TID) | ORAL | Status: DC
  Administered 2023-06-24 – 2023-06-25 (×3): 500 mg via ORAL
  Filled 2023-06-24: qty 6.3
  Filled 2023-06-24 (×3): qty 6.25
  Filled 2023-06-24: qty 6.3

## 2023-06-24 MED ORDER — SODIUM CHLORIDE 0.9 % IV SOLN: INTRAVENOUS | Status: AC

## 2023-06-24 NOTE — Plan of Care (Signed)

## 2023-06-24 NOTE — Progress Notes (Signed)
 1200 dose of Augmentin  still has not arrived. Called pharmacy and med will be sent again per pharmacy.

## 2023-06-24 NOTE — Progress Notes (Signed)
 Progress Note   Patient: Roberto Stanton ZOX:[id number] DOB: 1995/10/20 DOA: 06/22/2023     2 DOS: the patient was seen and examined on 06/24/2023   Brief hospital course:  28 year old with history of myophosphorylase deficiency (glycogen-storage disease V, previously McArdle's disease), rhabdomyolysis, presenting with muscle pain secondary to extraneous outside activities.   Assessment and Plan:   Acute rhabdomyolysis secondary to underlying myophosphorylase deficiency - CK showing marked uptrend up to greater than 20,000.  Continues to be persistently elevated.  Renal function normal for now.  Mild transaminitis secondary to rhabdo.  Aggressive IV fluid hydration on board.  Restarting NS at 150 mL/h.  Will recheck CK in AM.  Recommendations to supplement with liquid carbohydrate drinks such as Gatorade/Pedialyte.  Moderate exercise only.  No aggressive aerobic exercise.  There is no medication or treatment for this subtle recessive genetic condition, only prevention and monitoring.   Dental infection - Augmentin  on board.  Patient stating he did have difficulty with taking pills.  Requesting liquid Augmentin .  Will work closely with pharmacy to see if a regimen is available.   Subjective: Patient feeling well this morning states his muscle aches/pain is well-controlled.  Denies any fever, chills, chest pain, nausea, vomiting, abdominal pain.  Does report having difficulty with taking Augmentin  pills.  Requesting liquid Augmentin .  Physical Exam: Vitals:   06/23/23 1308 06/23/23 2003 06/24/23 0356 06/24/23 0748  BP: 117/77 122/82 102/65 105/63  Pulse: 83 73 78 68  Resp: 16 18 16 16   Temp: 98.3 F (36.8 C) 97.9 F (36.6 C) 97.7 F (36.5 C) 98.1 F (36.7 C)  TempSrc: Oral     SpO2: 100% 100% 100% 100%  Weight:      Height:        GENERAL:  Alert, pleasant, no acute distress  HEENT:  EOMI CARDIOVASCULAR:  RRR, no murmurs appreciated RESPIRATORY:  Clear to auscultation, no  wheezing, rales, or rhonchi GASTROINTESTINAL:  Soft, nontender, nondistended EXTREMITIES:  No LE edema bilaterally NEURO:  No new focal deficits appreciated SKIN:  No rashes noted PSYCH:  Appropriate mood and affect    Data Reviewed:  No new imaging to review  Labs: CBC: Recent Labs  Lab 06/22/23 1335 06/24/23 0350  WBC 5.7 5.5  HGB 15.5 14.3  HCT 44.1 42.2  MCV 89.8 90.0  PLT 226 196   Basic Metabolic Panel: Recent Labs  Lab 06/22/23 1335 06/22/23 1809 06/23/23 0839 06/24/23 0350  NA 136 137 137 138  K 3.7 3.8 3.6 3.5  CL 100 106 103 102  CO2 27 25 26 27   GLUCOSE 92 82 114* 103*  BUN 14 13 9 10   CREATININE 0.79 0.69 0.67 0.66  CALCIUM 9.1 8.6* 8.2* 9.0  MG 2.2  --  1.8  --    Liver Function Tests: Recent Labs  Lab 06/22/23 1341  AST 68*  ALT 72*  ALKPHOS 77  BILITOT 0.8  PROT 7.9  ALBUMIN 4.1   CBG: No results for input(s): "GLUCAP" in the last 168 hours.  Scheduled Meds:  amoxicillin -clavulanate  500 mg Oral Q8H   enoxaparin  (LOVENOX ) injection  40 mg Subcutaneous Q24H   Continuous Infusions:  sodium chloride  150 mL/hr at 06/24/23 0953   PRN Meds:.ibuprofen , ondansetron  (ZOFRAN ) IV, oxyCODONE   Family Communication: None at bedside  Disposition: Status is: Inpatient Remains inpatient appropriate because: Rhabdomyolysis     Time spent: 34 minutes  Author: Jodeane Mulligan, DO 06/24/2023 11:59 AM  For on call review [email or web address].

## 2023-06-24 NOTE — Progress Notes (Signed)
 Awaiting Augmentin  from pharmacy, message sent again.

## 2023-06-25 DIAGNOSIS — K047 Periapical abscess without sinus: Secondary | ICD-10-CM | POA: Diagnosis not present

## 2023-06-25 DIAGNOSIS — E7404 McArdle disease: Secondary | ICD-10-CM | POA: Diagnosis not present

## 2023-06-25 DIAGNOSIS — R7401 Elevation of levels of liver transaminase levels: Secondary | ICD-10-CM | POA: Diagnosis not present

## 2023-06-25 DIAGNOSIS — M6282 Rhabdomyolysis: Secondary | ICD-10-CM | POA: Diagnosis not present

## 2023-06-25 LAB — COMPREHENSIVE METABOLIC PANEL WITH GFR
ALT: 165 U/L — ABNORMAL HIGH (ref 0–44)
AST: 214 U/L — ABNORMAL HIGH (ref 15–41)
Albumin: 3.3 g/dL — ABNORMAL LOW (ref 3.5–5.0)
Alkaline Phosphatase: 56 U/L (ref 38–126)
Anion gap: 6 (ref 5–15)
BUN: 9 mg/dL (ref 6–20)
CO2: 27 mmol/L (ref 22–32)
Calcium: 8.8 mg/dL — ABNORMAL LOW (ref 8.9–10.3)
Chloride: 102 mmol/L (ref 98–111)
Creatinine, Ser: 0.69 mg/dL (ref 0.61–1.24)
GFR, Estimated: >60 mL/min (ref >60)
Glucose, Bld: 92 mg/dL (ref 70–99)
Potassium: 3.7 mmol/L (ref 3.5–5.1)
Sodium: 135 mmol/L (ref 135–145)
Total Bilirubin: 0.9 mg/dL (ref 0.0–1.2)
Total Protein: 7 g/dL (ref 6.5–8.1)

## 2023-06-25 LAB — CK: Total CK: >20000 U/L — ABNORMAL HIGH (ref 49–397)

## 2023-06-25 MED ORDER — AMOXICILLIN-POT CLAVULANATE 400-57 MG/5ML PO SUSR: 500.0000 mg | Freq: Three times a day (TID) | ORAL | 0 refills | Status: AC

## 2023-06-25 MED ORDER — OXYCODONE HCL 10 MG PO TABS: 10.0000 mg | ORAL_TABLET | Freq: Four times a day (QID) | ORAL | 0 refills | Status: AC | PRN

## 2023-06-25 NOTE — Progress Notes (Signed)
 D/C AVS completed and reviewed with pt. All opportunities for questions answered and clarified. IV removed. Pt will be wheeled down to car at medical mall entrance via wheelchair.  1058 Per MD pt needs to follow up with pain DR for oxycodone . Pt made aware by nurse

## 2023-06-25 NOTE — TOC Transition Note (Addendum)
 Transition of Care [facility]) - Discharge Note   Patient Details  Name: Roberto Stanton MRN: [id number] Date of Birth: November 18, 1995  [facility] of [facility]) CM/SW Contact:  Alexandra Ice, RN Phone Number: 06/25/2023, 10:24 AM   Clinical Narrative:    Patient has discharge orders, to discharge home today. No TOC needs identified.   Final next level of care: Home/Self Care Barriers to Discharge: Barriers Resolved   Patient Goals and CMS Choice            Discharge Placement                  Name of family member notified: Chrystal Patient and family notified of of transfer: 06/25/23  Discharge Plan and Services Additional resources added to the After Visit Summary for                    DME Agency: NA       HH Arranged: NA          Social Drivers of Health (SDOH) Interventions SDOH Screenings   Food Insecurity: No Food Insecurity (06/23/2023)  Housing: Low Risk  (06/23/2023)  Transportation Needs: No Transportation Needs (06/23/2023)  Utilities: Not At Risk (06/23/2023)  Financial Resource Strain: Low Risk  (05/04/2022)   Received from [facility], [facility]  Physical Activity: Sufficiently Active (10/30/2019)   Received from [facility], [facility]  Social Connections: Unknown (10/30/2019)   Received from [facility], [facility]  Stress: No Stress Concern Present (10/30/2019)   Received from [facility], [facility]  Tobacco Use: Medium Risk (06/22/2023)  Health Literacy: Low Risk  (10/30/2019)   Received from [facility], [facility]     Readmission Risk Interventions     No data to display

## 2023-06-25 NOTE — Discharge Summary (Signed)
 Physician Discharge Summary   Patient: Roberto Stanton MRN: [id number] DOB: 11-14-1995  Admit date:     06/22/2023  Discharge date: 06/25/23  Discharge Physician: Jodeane Mulligan   PCP: [facility]   Recommendations at discharge:    Pt to be discharged home.   If you experience worsening fever, chills, chest pain, shortness of breath, or other concerning symptoms, please call your PCP or go to the emergency department immediately.  Discharge Diagnoses: Principal Problem:   Rhabdomyolysis Active Problems:   Myophosphorylase deficiency (glycogen storage disease V, McArdle disease) (HCC)   Transaminitis   Dental infection  Resolved Problems:   * No resolved hospital problems. *   Hospital Course:  28 year old with history of myophosphorylase deficiency (glycogen-storage disease V, previously McArdle's disease), rhabdomyolysis, presenting with muscle pain secondary to extraneous outside activities.   Assessment and Plan:   Acute rhabdomyolysis secondary to underlying myophosphorylase deficiency - CK showing marked uptrend up to greater than 20,000.  Continues to be persistently elevated.  Renal function normal for now.  Mild transaminitis secondary to rhabdo.  Aggressive IV fluid hydration on board while inpatient.  Patient largely asymptomatic at this point.  Recommendations to supplement with liquid carbohydrate drinks such as Gatorade/Pedialyte.  Moderate exercise only.  No aggressive aerobic exercise.  There is no medication or treatment for this autosomal recessive genetic condition, only prevention and monitoring.  Short course of oxycodone  10 mg provided upon discharge.   Dental infection - Augmentin  on board.  Patient stating he did have difficulty with taking pills.  Requesting liquid Augmentin .  Will work closely with pharmacy to see if a regimen is available.  Prescription provided upon discharge.   Consultants: None Procedures performed:  None Disposition: Home Diet recommendation:  Discharge Diet Orders (From admission, onward)     Start     Ordered   06/25/23 0000  Diet - low sodium heart healthy        06/25/23 0950           Regular diet  DISCHARGE MEDICATION: Allergies as of 06/25/2023       Reactions   Morphine  Hives        Medication List     STOP taking these medications    BC HEADACHE POWDER PO       TAKE these medications    acetaminophen  325 MG tablet Commonly known as: TYLENOL  Take 2 tablets (650 mg total) by mouth every 6 (six) hours as needed for mild pain (pain score 1-3) (or Fever >/= 101).   amoxicillin -clavulanate 400-57 MG/5ML suspension Commonly known as: AUGMENTIN  Take 6.3 mLs (500 mg total) by mouth every 8 (eight) hours for 5 days.   gabapentin 300 MG capsule Commonly known as: NEURONTIN Take 300 mg by mouth 3 (three) times daily.   naloxone  4 MG/0.1ML Liqd nasal spray kit Commonly known as: NARCAN  Place 1 spray into the nose once.   Oxycodone  HCl 10 MG Tabs Take 1 tablet (10 mg total) by mouth every 6 (six) hours as needed for up to 5 days.   Voltaren 1 % Gel Generic drug: diclofenac Sodium Apply 2 g topically 4 (four) times daily as needed.         Discharge Exam: Filed Weights   06/22/23 1805  Weight: 63.5 kg    GENERAL:  Alert, pleasant, no acute distress  HEENT:  EOMI CARDIOVASCULAR:  RRR, no murmurs appreciated RESPIRATORY:  Clear to auscultation, no wheezing, rales, or rhonchi GASTROINTESTINAL:  Soft,  nontender, nondistended EXTREMITIES:  No LE edema bilaterally NEURO:  No new focal deficits appreciated SKIN:  No rashes noted PSYCH:  Appropriate mood and affect     Condition at discharge: improving  The results of significant diagnostics from this hospitalization (including imaging, microbiology, ancillary and laboratory) are listed below for reference.   Imaging Studies: No results found.  Microbiology: Results for orders placed or  performed during the hospital encounter of 01/23/23  Chlamydia/NGC rt PCR ([facility] only)     Status: None   Collection Time: 01/23/23  3:54 PM   Specimen: Urine, Clean Catch  Result Value Ref Range Status   Specimen source GC/Chlam URINE, RANDOM  Final   Chlamydia Tr NOT DETECTED NOT DETECTED Final   N gonorrhoeae NOT DETECTED NOT DETECTED Final    Comment: (NOTE) This CT/NG assay has not been evaluated in patients with a history of  hysterectomy. Performed at [facility], [address], [place], [place] [address]   Urine Culture (for pregnant, neutropenic or urologic patients or patients with an indwelling urinary catheter)     Status: None   Collection Time: 01/24/23  8:43 AM   Specimen: Urine, Clean Catch  Result Value Ref Range Status   Specimen Description   Final    URINE, CLEAN CATCH Performed at [facility], [address], [place], [place] [address]    Special Requests   Final    NONE Performed at [facility], [address], [place], [place] [address]    Culture   Final    NO GROWTH Performed at [facility], [address] [place], [place] [address]    Report Status 01/25/2023 FINAL  Final    Labs: CBC: Recent Labs  Lab 06/22/23 1335 06/24/23 0350  WBC 5.7 5.5  HGB 15.5 14.3  HCT 44.1 42.2  MCV 89.8 90.0  PLT 226 196   Basic Metabolic Panel: Recent Labs  Lab 06/22/23 1335 06/22/23 1809 06/23/23 0839 06/24/23 0350 06/25/23 0519  NA 136 137 137 138 135  K 3.7 3.8 3.6 3.5 3.7  CL 100 106 103 102 102  CO2 27 25 26 27 27   GLUCOSE 92 82 114* 103* 92  BUN 14 13 9 10 9   CREATININE 0.79 0.69 0.67 0.66 0.69  CALCIUM 9.1 8.6* 8.2* 9.0 8.8*  MG 2.2  --  1.8  --   --    Liver Function Tests: Recent Labs  Lab 06/22/23 1341 06/25/23 0519  AST 68* 214*  ALT 72* 165*  ALKPHOS 77 56  BILITOT 0.8 0.9  PROT 7.9 7.0  ALBUMIN 4.1 3.3*   CBG: No results for input(s): "GLUCAP" in the last 168 hours.  Discharge time  spent: 26 minutes.  Signed: Jodeane Mulligan, DO [facility] 06/25/2023

## 2023-08-06 ENCOUNTER — Inpatient Hospital Stay
Admission: EM | Admit: 2023-08-06 | Discharge: 2023-08-08 | DRG: 642 | Disposition: A | Discharge status: Home / Self Care | Attending: Internal Medicine | Admitting: Internal Medicine

## 2023-08-06 ENCOUNTER — Other Ambulatory Visit: Payer: Self-pay

## 2023-08-06 DIAGNOSIS — E7404 McArdle disease: Secondary | ICD-10-CM | POA: Diagnosis not present

## 2023-08-06 DIAGNOSIS — Z87442 Personal history of urinary calculi: Secondary | ICD-10-CM

## 2023-08-06 DIAGNOSIS — E876 Hypokalemia: Secondary | ICD-10-CM | POA: Diagnosis present

## 2023-08-06 DIAGNOSIS — M6282 Rhabdomyolysis: Principal | ICD-10-CM | POA: Diagnosis present

## 2023-08-06 DIAGNOSIS — Z87891 Personal history of nicotine dependence: Secondary | ICD-10-CM

## 2023-08-06 DIAGNOSIS — Z8249 Family history of ischemic heart disease and other diseases of the circulatory system: Secondary | ICD-10-CM

## 2023-08-06 DIAGNOSIS — Z885 Allergy status to narcotic agent status: Secondary | ICD-10-CM

## 2023-08-06 DIAGNOSIS — Z886 Allergy status to analgesic agent status: Secondary | ICD-10-CM | POA: Diagnosis not present

## 2023-08-06 DIAGNOSIS — Z79899 Other long term (current) drug therapy: Secondary | ICD-10-CM

## 2023-08-06 DIAGNOSIS — D72829 Elevated white blood cell count, unspecified: Secondary | ICD-10-CM | POA: Diagnosis not present

## 2023-08-06 LAB — BASIC METABOLIC PANEL WITH GFR
Anion gap: 8 (ref 5–15)
BUN: 18 mg/dL (ref 6–20)
CO2: 28 mmol/L (ref 22–32)
Calcium: 9.1 mg/dL (ref 8.9–10.3)
Chloride: 100 mmol/L (ref 98–111)
Creatinine, Ser: 0.83 mg/dL (ref 0.61–1.24)
GFR, Estimated: >60 mL/min (ref >60)
Glucose, Bld: 117 mg/dL — ABNORMAL HIGH (ref 70–99)
Potassium: 3.4 mmol/L — ABNORMAL LOW (ref 3.5–5.1)
Sodium: 136 mmol/L (ref 135–145)

## 2023-08-06 LAB — CBC
HCT: 44.6 % (ref 39.0–52.0)
Hemoglobin: 15.2 g/dL (ref 13.0–17.0)
MCH: 30.7 pg (ref 26.0–34.0)
MCHC: 34.1 g/dL (ref 30.0–36.0)
MCV: 90.1 fL (ref 80.0–100.0)
Platelets: 245 10*3/uL (ref 150–400)
RBC: 4.95 MIL/uL (ref 4.22–5.81)
RDW: 13 % (ref 11.5–15.5)
WBC: 16.1 10*3/uL — ABNORMAL HIGH (ref 4.0–10.5)
nRBC: 0 % (ref 0.0–0.2)

## 2023-08-06 LAB — URINALYSIS, ROUTINE W REFLEX MICROSCOPIC
Bacteria, UA: NONE SEEN
Bilirubin Urine: NEGATIVE
Glucose, UA: NEGATIVE mg/dL
Ketones, ur: NEGATIVE mg/dL
Leukocytes,Ua: NEGATIVE
Nitrite: NEGATIVE
Protein, ur: 30 mg/dL — AB
Specific Gravity, Urine: 1.005 (ref 1.005–1.030)
Squamous Epithelial / HPF: 0 /HPF (ref 0–5)
pH: 6 (ref 5.0–8.0)

## 2023-08-06 LAB — CK: Total CK: >20000 U/L — ABNORMAL HIGH (ref 49–397)

## 2023-08-06 LAB — MAGNESIUM: Magnesium: 2.1 mg/dL (ref 1.7–2.4)

## 2023-08-06 LAB — HEPATIC FUNCTION PANEL
ALT: 108 U/L — ABNORMAL HIGH (ref 0–44)
AST: 251 U/L — ABNORMAL HIGH (ref 15–41)
Albumin: 4.2 g/dL (ref 3.5–5.0)
Alkaline Phosphatase: 68 U/L (ref 38–126)
Bilirubin, Direct: 0.1 mg/dL (ref 0.0–0.2)
Indirect Bilirubin: 0.8 mg/dL (ref 0.3–0.9)
Total Bilirubin: 0.9 mg/dL (ref 0.0–1.2)
Total Protein: 8.2 g/dL — ABNORMAL HIGH (ref 6.5–8.1)

## 2023-08-06 MED ORDER — SODIUM CHLORIDE 0.9 % IV BOLUS
1000.0000 mL | Freq: Once | INTRAVENOUS | Status: AC
  Administered 2023-08-06: 1000 mL via INTRAVENOUS

## 2023-08-06 MED ORDER — ONDANSETRON HCL 4 MG/2ML IJ SOLN: 4.0000 mg | Freq: Three times a day (TID) | INTRAMUSCULAR | Status: DC | PRN

## 2023-08-06 MED ORDER — OXYCODONE-ACETAMINOPHEN 5-325 MG PO TABS
2.0000 | ORAL_TABLET | ORAL | Status: DC
  Filled 2023-08-06: qty 2

## 2023-08-06 MED ORDER — POTASSIUM CHLORIDE CRYS ER 20 MEQ PO TBCR
20.0000 meq | EXTENDED_RELEASE_TABLET | Freq: Once | ORAL | Status: DC
  Filled 2023-08-06: qty 1

## 2023-08-06 MED ORDER — OXYCODONE HCL 5 MG PO TABS
20.0000 mg | ORAL_TABLET | Freq: Four times a day (QID) | ORAL | Status: DC | PRN
  Administered 2023-08-06: 20 mg via ORAL
  Filled 2023-08-06: qty 4

## 2023-08-06 MED ORDER — IBUPROFEN 400 MG PO TABS: 200.0000 mg | ORAL_TABLET | Freq: Four times a day (QID) | ORAL | Status: DC | PRN

## 2023-08-06 MED ORDER — OXYCODONE HCL 5 MG PO TABS
10.0000 mg | ORAL_TABLET | ORAL | Status: AC
  Administered 2023-08-06: 10 mg via ORAL
  Filled 2023-08-06: qty 2

## 2023-08-06 MED ORDER — GABAPENTIN 300 MG PO CAPS
300.0000 mg | ORAL_CAPSULE | Freq: Three times a day (TID) | ORAL | Status: DC
  Administered 2023-08-07 – 2023-08-08 (×3): 300 mg via ORAL
  Filled 2023-08-06 (×3): qty 1

## 2023-08-06 MED ORDER — ENOXAPARIN SODIUM 40 MG/0.4ML IJ SOSY
40.0000 mg | PREFILLED_SYRINGE | INTRAMUSCULAR | Status: DC
  Filled 2023-08-06: qty 0.4

## 2023-08-06 MED ORDER — SODIUM CHLORIDE 0.9 % IV SOLN: INTRAVENOUS | Status: AC

## 2023-08-06 MED ORDER — METHOCARBAMOL 500 MG PO TABS
500.0000 mg | ORAL_TABLET | Freq: Three times a day (TID) | ORAL | Status: DC | PRN
  Administered 2023-08-07: 500 mg via ORAL
  Filled 2023-08-06: qty 1

## 2023-08-06 MED ORDER — OXYCODONE HCL 5 MG PO TABS: 5.0000 mg | ORAL_TABLET | Freq: Four times a day (QID) | ORAL | Status: DC | PRN

## 2023-08-06 NOTE — ED Triage Notes (Signed)
 Pt comes with c/o dark colored urine. Pt states he has been working in attic for last few days and not sure if he has rhabdo now. Pt was sent here by pcp due to symptoms. Pt states body aches. Pt has had rhabdo before.

## 2023-08-06 NOTE — ED Provider Notes (Signed)
 [facility] Provider Note    Event Date/Time   First MD Initiated Contact with Patient 08/06/23 1629     (approximate)   History   Dark urine   HPI  Roberto Stanton is a 28 y.o. male history of glycogen storage disease and previous rhabdomyolysis  Patient reports yesterday worked for 2 days up in a warm attic.  He noticed when he got done yesterday that he was having achiness in his right upper leg, and then that his urine was dark.  He reports it was quite dark but he went home hydrated very well and his urine color seems to have been coming back to normal now.  He does report he has a achiness mostly pointing towards his right quadricep.  He reports other various mild muscle aches but reports seemingly improving after hydrating well at home.  He has a history of rhabdomyolysis in the past and reports that he usually hydrates very well and that will make his symptoms improve along with the urine darkness.  He has no severe pain at this time.  Takes oxycodone  as a prescribed home medication for muscle aches     Physical Exam   Triage Vital Signs: ED Triage Vitals  Encounter Vitals Group     BP 08/06/23 1617 (!) 138/96     Girls Systolic BP Percentile --      Girls Diastolic BP Percentile --      Boys Systolic BP Percentile --      Boys Diastolic BP Percentile --      Pulse Rate 08/06/23 1617 99     Resp 08/06/23 1617 18     Temp 08/06/23 1617 98 F (36.7 C)     Temp src --      SpO2 08/06/23 1617 100 %     Weight 08/06/23 1616 140 lb (63.5 kg)     Height 08/06/23 1616 5' 9 (1.753 m)     Head Circumference --      Peak Flow --      Pain Score 08/06/23 1615 3     Pain Loc --      Pain Education --      Exclude from Growth Chart --     Most recent vital signs: Vitals:   08/07/23 0250 08/07/23 0715  BP: 113/76 112/68  Pulse: 73 72  Resp: 16 16  Temp: 98.4 F (36.9 C) 98.6 F (37 C)  SpO2: 100% 100%     General: Awake, no  distress.  CV:  Good peripheral perfusion.  Normal tones rate Resp:  Normal effort.  Clear bilateral Abd:  No distention.  Soft nontenderOther:  Reports tenderness to palpation of major muscle groups, in particular he does report pain in the quads right side greater than left.  No compartment syndrome or tense compartments are noted   ED Results / Procedures / Treatments   Labs (all labs ordered are listed, but only abnormal results are displayed) Labs Reviewed  CBC - Abnormal; Notable for the following components:      Result Value   WBC 16.1 (*)    All other components within normal limits  BASIC METABOLIC PANEL WITH GFR - Abnormal; Notable for the following components:   Potassium 3.4 (*)    Glucose, Bld 117 (*)    All other components within normal limits  URINALYSIS, ROUTINE W REFLEX MICROSCOPIC - Abnormal; Notable for the following components:   Color, Urine YELLOW (*)  APPearance CLEAR (*)    Hgb urine dipstick LARGE (*)    Protein, ur 30 (*)    All other components within normal limits  CK - Abnormal; Notable for the following components:   Total CK >20,000 (*)    All other components within normal limits  CK - Abnormal; Notable for the following components:   Total CK >20,000 (*)    All other components within normal limits  HEPATIC FUNCTION PANEL - Abnormal; Notable for the following components:   Total Protein 8.2 (*)    AST 251 (*)    ALT 108 (*)    All other components within normal limits  COMPREHENSIVE METABOLIC PANEL WITH GFR - Abnormal; Notable for the following components:   Glucose, Bld 107 (*)    Calcium 8.4 (*)    Total Protein 6.3 (*)    Albumin 3.2 (*)    AST 213 (*)    ALT 99 (*)    Anion gap 4 (*)    All other components within normal limits  MAGNESIUM   CBC     EKG     RADIOLOGY     PROCEDURES:  Critical Care performed: No  Procedures     IMPRESSION / MDM / ASSESSMENT AND PLAN / ED COURSE  I reviewed the triage vital  signs and the nursing notes.                              Differential diagnosis includes, but is not limited to, dehydration, rhabdomyolysis, myositis, acute kidney injury etc. Labs including CBC metabolic panel etc.  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  Will hydrate well.  Pain control with oxycodone  provided.  Patient reports that this is providing relief but did have to go up slightly on his typical dosing  He is alert well oriented, urinating well, and given elevated CK greater than 20,000 will require admission to the hospital.  Discussed case with Dr. Rosalea Collin  Patient's presentation is most consistent with acute complicated illness / injury requiring diagnostic workup.          FINAL CLINICAL IMPRESSION(S) / ED DIAGNOSES   Final diagnoses:  Non-traumatic rhabdomyolysis     Rx / DC Orders   ED Discharge Orders     None        Note:  This document was prepared using Dragon voice recognition software and may include unintentional dictation errors.   Iver Marker, MD 08/07/23 1028

## 2023-08-06 NOTE — H&P (Signed)
 History and Physical    Roberto Stanton:[id number] DOB: 06-14-1995 DOA: 08/06/2023  Referring MD/NP/PA:   PCP: [facility]   Patient coming from:  The patient is coming from home.     Chief Complaint: dark urine and muscle pain  HPI: Roberto Stanton is a 28 y.o. male with medical history significant of rhabdomyolysis, myophosphorylase deficiency (glycogen storage disease V, McArdle disease), transaminitis, pericarditis, kidney stone, marijuana use, former smoker, who presents with muscle pain and dark urine.  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  He developed body aches and muscle pain, particularly in both upper legs.  The pain is constant, aching, severe, nonradiating, not aggravated or alleviated by any known factors.  Patient also noticed very dark urine, no dysuria or burning with urination.  Denies chest pain, cough, SOB.  No nausea, vomiting, diarrhea or abdominal pain. He takes 10 mg of oxycodone  as a prescribed home medication for muscle aches, but no significant improvement.  He feels tired.   Data reviewed independently and ED Course: pt was found to have CK level> 20,000, WBC 16.1, UA negative for UTI (RBC 0-5), temperature normal, blood pressure 138/96, heart rate 99, 18, oxygen saturation 100% on room air.   EKG: Not done in ED, will get one.     Review of Systems:   General: no fevers, chills, no body weight gain, has fatigue HEENT: no blurry vision, hearing changes or sore throat Respiratory: no dyspnea, coughing, wheezing CV: no chest pain, no palpitations GI: no nausea, vomiting, abdominal pain, diarrhea, constipation GU: no dysuria, burning on urination, increased urinary frequency, hematuria. Has dark urine Ext: no leg edema Neuro: no unilateral weakness, numbness, or tingling, no vision change or hearing loss Skin: no rash, no skin tear. MSK: Has body aches and muscle pain Heme: No easy bruising.  Travel history:  No recent long distant travel.   Allergy:  Allergies  Allergen Reactions   Acetaminophen  Nausea Only   Morphine  Hives    Past Medical History:  Diagnosis Date   Kidney stones    McArdle disease (HCC) 09/28/2021   Pericarditis    Rhabdomyolysis     Past Surgical History:  Procedure Laterality Date   leg muscle biopsy      Social History:  reports that he has quit smoking. His smoking use included cigarettes. He has never used smokeless tobacco. He reports current drug use. Drug: Marijuana. He reports that he does not drink alcohol.  Family History:  Family History  Problem Relation Age of Onset   Heart disease Father      Prior to Admission medications   Medication Sig Start Date End Date Taking? Authorizing Provider  diclofenac Sodium (VOLTAREN) 1 % GEL Apply 2 g topically 4 (four) times daily as needed.    [provider]  gabapentin (NEURONTIN) 300 MG capsule Take 300 mg by mouth 3 (three) times daily. 06/01/23   [provider]  naloxone  (NARCAN ) nasal spray 4 mg/0.1 mL Place 1 spray into the nose once.    [provider]    Physical Exam: Vitals:   08/06/23 1616 08/06/23 1617 08/06/23 1942  BP:  (!) 138/96 (!) 128/92  Pulse:  99 85  Resp:  18 20  Temp:  98 F (36.7 C) 98.5 F (36.9 C)  TempSrc:   Oral  SpO2:  100% 99%  Weight: 63.5 kg    Height: 5' 9 (1.753 m)  General: Not in acute distress HEENT:       Eyes: PERRL, EOMI, no jaundice       ENT: No discharge from the ears and nose, no pharynx injection, no tonsillar enlargement.        Neck: No JVD, no bruit, no mass felt. Heme: No neck lymph node enlargement. Cardiac: S1/S2, RRR, No murmurs, No gallops or rubs. Respiratory: No rales, wheezing, rhonchi or rubs. GI: Soft, nondistended, nontender, no rebound pain, no organomegaly, BS present. GU: No hematuria Ext: No pitting leg edema bilaterally. 1+DP/PT pulse bilaterally. Musculoskeletal: Has muscle tenderness,  particularly in both upper leg Skin: No rashes.  Neuro: Alert, oriented X3, cranial nerves II-XII grossly intact, moves all extremities normally.   Psych: Patient is not psychotic, no suicidal or hemocidal ideation.  Labs on Admission: I have personally reviewed following labs and imaging studies  CBC: Recent Labs  Lab 08/06/23 1617  WBC 16.1*  HGB 15.2  HCT 44.6  MCV 90.1  PLT 245   Basic Metabolic Panel: Recent Labs  Lab 08/06/23 1617  NA 136  K 3.4*  CL 100  CO2 28  GLUCOSE 117*  BUN 18  CREATININE 0.83  CALCIUM 9.1   GFR: Estimated Creatinine Clearance: 117.9 mL/min (by C-G formula based on SCr of 0.83 mg/dL). Liver Function Tests: No results for input(s): AST, ALT, ALKPHOS, BILITOT, PROT, ALBUMIN in the last 168 hours. No results for input(s): LIPASE, AMYLASE in the last 168 hours. No results for input(s): AMMONIA in the last 168 hours. Coagulation Profile: No results for input(s): INR, PROTIME in the last 168 hours. Cardiac Enzymes: Recent Labs  Lab 08/06/23 1617  CKTOTAL >20,000*   BNP (last 3 results) No results for input(s): PROBNP in the last 8760 hours. HbA1C: No results for input(s): HGBA1C in the last 72 hours. CBG: No results for input(s): GLUCAP in the last 168 hours. Lipid Profile: No results for input(s): CHOL, HDL, LDLCALC, TRIG, CHOLHDL, LDLDIRECT in the last 72 hours. Thyroid  Function Tests: No results for input(s): TSH, T4TOTAL, FREET4, T3FREE, THYROIDAB in the last 72 hours. Anemia Panel: No results for input(s): VITAMINB12, FOLATE, FERRITIN, TIBC, IRON, RETICCTPCT in the last 72 hours. Urine analysis:    Component Value Date/Time   COLORURINE YELLOW (A) 08/06/2023 1651   APPEARANCEUR CLEAR (A) 08/06/2023 1651   LABSPEC 1.005 08/06/2023 1651   PHURINE 6.0 08/06/2023 1651   GLUCOSEU NEGATIVE 08/06/2023 1651   HGBUR LARGE (A) 08/06/2023 1651   BILIRUBINUR NEGATIVE  08/06/2023 1651   KETONESUR NEGATIVE 08/06/2023 1651   PROTEINUR 30 (A) 08/06/2023 1651   NITRITE NEGATIVE 08/06/2023 1651   LEUKOCYTESUR NEGATIVE 08/06/2023 1651   Sepsis Labs: @LABRCNTIP (procalcitonin:4,lacticidven:4) )No results found for this or any previous visit (from the past 240 hours).   Radiological Exams on Admission:   Assessment/Plan Principal Problem:   Rhabdomyolysis Active Problems:   Myophosphorylase deficiency (glycogen storage disease V, McArdle disease) (HCC)   Hypokalemia   Leukocytosis   Assessment and Plan:   Rhabdomyolysis in the setting of McArdle disease: Patient has recurrent nontraumatic rhabdomyolysis with CK up to > 20,000.  Renal function normal.    - will admit to med-Surg bed as inpt - IVF: received 1L NS in ED, will continue at 150 cc/h of NS - will check CK daily - check LFT - Pain control: As needed oxycodone  and low-dose ibuprofen  - Continue home gabapentin -I have discussed with the patient about the possibility of changing his job, avoid working in the warm  condition for the future  Myophosphorylase deficiency (glycogen storage disease V, McArdle disease) (HCC) -see above   Hypokalemia: Potassium 3.4 - Repleted potassium - Check magnesium  level  Leukocytosis: WBC 16.1, no source of infection identified, no fever, likely reactive - Follow-up by CBC    DVT ppx: SQ Lovenox   Code Status: Full code    Family Communication:     not done, no family member is at bed side.   Disposition Plan:  Anticipate discharge back to previous environment  Consults called:  none  Admission status and Level of care: Med-Surg:  as inpt        Dispo: The patient is from: Home              Anticipated d/c is to: Home              Anticipated d/c date is: 2 days              Patient currently is not medically stable to d/c.    Severity of Illness:  The appropriate patient status for this patient is INPATIENT. Inpatient status is judged  to be reasonable and necessary in order to provide the required intensity of service to ensure the patient's safety. The patient's presenting symptoms, physical exam findings, and initial radiographic and laboratory data in the context of their chronic comorbidities is felt to place them at high risk for further clinical deterioration. Furthermore, it is not anticipated that the patient will be medically stable for discharge from the hospital within 2 midnights of admission.   * I certify that at the point of admission it is my clinical judgment that the patient will require inpatient hospital care spanning beyond 2 midnights from the point of admission due to high intensity of service, high risk for further deterioration and high frequency of surveillance required.*       Date of Service 08/06/2023    Fidencio Hue [facility]   If 7PM-7AM, please contact night-coverage [email or web address] 08/06/2023, 8:12 PM

## 2023-08-06 NOTE — ED Notes (Signed)
 Admitting MD at bedside.

## 2023-08-07 DIAGNOSIS — M6282 Rhabdomyolysis: Secondary | ICD-10-CM | POA: Diagnosis not present

## 2023-08-07 LAB — COMPREHENSIVE METABOLIC PANEL WITH GFR
ALT: 99 U/L — ABNORMAL HIGH (ref 0–44)
AST: 213 U/L — ABNORMAL HIGH (ref 15–41)
Albumin: 3.2 g/dL — ABNORMAL LOW (ref 3.5–5.0)
Alkaline Phosphatase: 67 U/L (ref 38–126)
Anion gap: 4 — ABNORMAL LOW (ref 5–15)
BUN: 11 mg/dL (ref 6–20)
CO2: 28 mmol/L (ref 22–32)
Calcium: 8.4 mg/dL — ABNORMAL LOW (ref 8.9–10.3)
Chloride: 107 mmol/L (ref 98–111)
Creatinine, Ser: 0.69 mg/dL (ref 0.61–1.24)
GFR, Estimated: >60 mL/min (ref >60)
Glucose, Bld: 107 mg/dL — ABNORMAL HIGH (ref 70–99)
Potassium: 3.6 mmol/L (ref 3.5–5.1)
Sodium: 139 mmol/L (ref 135–145)
Total Bilirubin: 0.7 mg/dL (ref 0.0–1.2)
Total Protein: 6.3 g/dL — ABNORMAL LOW (ref 6.5–8.1)

## 2023-08-07 LAB — CBC
HCT: 40.6 % (ref 39.0–52.0)
Hemoglobin: 14 g/dL (ref 13.0–17.0)
MCH: 31.4 pg (ref 26.0–34.0)
MCHC: 34.5 g/dL (ref 30.0–36.0)
MCV: 91 fL (ref 80.0–100.0)
Platelets: 206 10*3/uL (ref 150–400)
RBC: 4.46 MIL/uL (ref 4.22–5.81)
RDW: 13.2 % (ref 11.5–15.5)
WBC: 7.9 10*3/uL (ref 4.0–10.5)
nRBC: 0 % (ref 0.0–0.2)

## 2023-08-07 LAB — CK: Total CK: >20000 U/L — ABNORMAL HIGH (ref 49–397)

## 2023-08-07 MED ORDER — OXYCODONE HCL 5 MG PO TABS
20.0000 mg | ORAL_TABLET | ORAL | Status: DC | PRN
  Administered 2023-08-07 – 2023-08-08 (×7): 20 mg via ORAL
  Filled 2023-08-07 (×7): qty 4

## 2023-08-07 MED ORDER — SODIUM CHLORIDE 0.9 % IV SOLN: INTRAVENOUS | Status: AC

## 2023-08-07 MED ORDER — ORAL CARE MOUTH RINSE: 15.0000 mL | OROMUCOSAL | Status: DC | PRN

## 2023-08-07 NOTE — Plan of Care (Signed)

## 2023-08-07 NOTE — Progress Notes (Signed)
 Progress Note   Patient: Roberto Stanton ZOX:[id number] DOB: 06-Jan-1996 DOA: 08/06/2023     1 DOS: the patient was seen and examined on 08/07/2023   Brief hospital course: KISHAN WACHSMUTH is a 28 y.o. male with medical history significant of rhabdomyolysis, myophosphorylase deficiency (glycogen storage disease V, McArdle disease), transaminitis, pericarditis, kidney stone, marijuana use, former smoker, who presents with muscle pain and dark urine.   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  He developed body aches and muscle pain, particularly in both upper legs.  The pain is constant, aching, severe, nonradiating, not aggravated or alleviated by any known factors.  Patient also noticed very dark urine, no dysuria or burning with urination.  Denies chest pain, cough, SOB.  No nausea, vomiting, diarrhea or abdominal pain. He takes 10 mg of oxycodone  as a prescribed home medication for muscle aches, but no significant improvement.  He feels tired.    Assessment and Plan:  Rhabdomyolysis in the setting of McArdle disease: Patient has recurrent nontraumatic rhabdomyolysis with CK up to > 20,000.  Renal function normal.  Continue IV fluid - Continue to monitor CPK closely Continue as needed oxycodone  and low-dose ibuprofen  - Continue home gabapentin   Myophosphorylase deficiency (glycogen storage disease V, McArdle disease) (HCC) -see above   Hypokalemia:  Continue to monitor closely   Leukocytosis:  Continue to monitor closely No signs of infection at this time     DVT ppx: SQ Lovenox    Code Status: Full code     Family Communication:     not done, no family member is at bed side.    Disposition Plan:  Anticipate discharge back to previous environment   Consults called:  none   Admission status and Level of care: Med-Surg:  as inpt        Subjective:  Patient seen and examined at bedside this morning Admits to muscle pain however improving Denies nausea  vomiting chest pain cough  Physical Exam: General: Not in acute distress  Heme: No neck lymph node enlargement. Cardiac: S1/S2, RRR, No murmurs, No gallops or rubs. Respiratory: No rales, wheezing, rhonchi or rubs. GI: Soft, nondistended, nontender, no rebound pain, no organomegaly, BS present. GU: No hematuria Ext: No pitting leg edema bilaterally. 1+DP/PT pulse bilaterally. Musculoskeletal: Has muscle tenderness, particularly in both upper leg Skin: No rashes.  Neuro: Alert, oriented X3, cranial nerves II-XII grossly intact, moves all extremities normally.     Vitals:   08/06/23 2049 08/07/23 0250 08/07/23 0715 08/07/23 1552  BP: (!) 130/91 113/76 112/68 100/62  Pulse: 76 73 72 80  Resp: 20 16 16 16   Temp: 97.7 F (36.5 C) 98.4 F (36.9 C) 98.6 F (37 C) 97.9 F (36.6 C)  TempSrc: Oral Oral  Oral  SpO2: 100% 100% 100% 100%  Weight:      Height:        Data Reviewed:    Latest Ref Rng & Units 08/07/2023    4:10 AM 08/06/2023    4:17 PM 06/24/2023    3:50 AM  CBC  WBC 4.0 - 10.5 K/uL 7.9  16.1  5.5   Hemoglobin 13.0 - 17.0 g/dL 81.1  91.4  78.2   Hematocrit 39.0 - 52.0 % 40.6  44.6  42.2   Platelets 150 - 400 K/uL 206  245  196        Latest Ref Rng & Units 08/07/2023    4:10 AM 08/06/2023  4:17 PM 06/25/2023    5:19 AM  BMP  Glucose 70 - 99 mg/dL 161  096  92   BUN 6 - 20 mg/dL 11  18  9    Creatinine 0.61 - 1.24 mg/dL 0.45  4.09  8.11   Sodium 135 - 145 mmol/L 139  136  135   Potassium 3.5 - 5.1 mmol/L 3.6  3.4  3.7   Chloride 98 - 111 mmol/L 107  100  102   CO2 22 - 32 mmol/L 28  28  27    Calcium 8.9 - 10.3 mg/dL 8.4  9.1  8.8       Family Communication: None present at bedside  Disposition: Status is: Inpatient  Time spent: 57 minutes  Author: Ezzard Holms, MD 08/07/2023 5:22 PM  For on call review [email or web address].

## 2023-08-07 NOTE — TOC Initial Note (Signed)
 Transition of Care [facility]) - Initial/Assessment Note    Patient Details  Name: Roberto Stanton MRN: [id number] Date of Birth: May 23, 1995  Transition [facility]) CM/SW Contact:    Alexandra Ice, RN Phone Number: 08/07/2023, 7:26 AM  Clinical Narrative:                 Patient independent, has PCP. No discharge needs identified by TOC at this time. Will continue to monitor.         Patient Goals and CMS Choice            Expected Discharge Plan and Services                                              Prior Living Arrangements/Services                       Activities of Daily Living   ADL Screening (condition at time of admission) Independently performs ADLs?: Yes (appropriate for developmental age) Is the patient deaf or have difficulty hearing?: No Does the patient have difficulty seeing, even when wearing glasses/contacts?: No Does the patient have difficulty concentrating, remembering, or making decisions?: No  Permission Sought/Granted                  Emotional Assessment              Admission diagnosis:  Rhabdomyolysis [M62.82] Patient Active Problem List   Diagnosis Date Noted   Leukocytosis 08/06/2023   Dental infection 06/22/2023   Epididymitis 01/24/2023   Rhabdomyolysis 01/24/2023   Opioid dependence (HCC) 01/24/2023   Testicular pain 01/23/2023   Left hydrocele 01/23/2023   Hypokalemia 04/29/2022   Hyponatremia 04/01/2022   Chronic pain 03/09/2022   Large tonsils 03/04/2022   Elevated CK 02/11/2022   Muscle cramps 01/27/2022   Non-traumatic rhabdomyolysis, recurrent 01/26/2022   Strep pharyngitis 10/04/2021   Myophosphorylase deficiency (glycogen storage disease V, McArdle disease) (HCC) 09/28/2021   Transaminitis 09/26/2021   Acute respiratory failure (HCC) 01/08/2020   PCP:  [facility] Pharmacy:   [facility] - [place],  - [address] [place]  [address] Phone: [phone] Fax: [phone]     Social Drivers of Health (SDOH) Social History: SDOH Screenings   Food Insecurity: No Food Insecurity (08/07/2023)  Housing: Low Risk  (08/07/2023)  Transportation Needs: No Transportation Needs (08/07/2023)  Utilities: Not At Risk (08/07/2023)  Financial Resource Strain: Low Risk  (05/04/2022)   [facility]  Physical Activity: Sufficiently Active (10/30/2019)   Received from [facility]  Social Connections: Unknown (10/30/2019)   Received from [facility]  Stress: No Stress Concern Present (10/30/2019)   Received from [facility]  Tobacco Use: Medium Risk (08/06/2023)  Health Literacy: Low Risk  (10/30/2019)   Received from [facility]   SDOH Interventions:     Readmission Risk Interventions     No data to display

## 2023-08-08 DIAGNOSIS — M6282 Rhabdomyolysis: Secondary | ICD-10-CM | POA: Diagnosis not present

## 2023-08-08 LAB — CBC WITH DIFFERENTIAL/PLATELET
Abs Immature Granulocytes: 0.02 10*3/uL (ref 0.00–0.07)
Basophils Absolute: 0.1 10*3/uL (ref 0.0–0.1)
Basophils Relative: 1 %
Eosinophils Absolute: 0.3 10*3/uL (ref 0.0–0.5)
Eosinophils Relative: 4 %
HCT: 42.3 % (ref 39.0–52.0)
Hemoglobin: 14.9 g/dL (ref 13.0–17.0)
Immature Granulocytes: 0 %
Lymphocytes Relative: 37 %
Lymphs Abs: 2.3 10*3/uL (ref 0.7–4.0)
MCH: 32 pg (ref 26.0–34.0)
MCHC: 35.2 g/dL (ref 30.0–36.0)
MCV: 90.8 fL (ref 80.0–100.0)
Monocytes Absolute: 0.6 10*3/uL (ref 0.1–1.0)
Monocytes Relative: 9 %
Neutro Abs: 2.9 10*3/uL (ref 1.7–7.7)
Neutrophils Relative %: 49 %
Platelets: 216 10*3/uL (ref 150–400)
RBC: 4.66 MIL/uL (ref 4.22–5.81)
RDW: 13 % (ref 11.5–15.5)
WBC: 6.1 10*3/uL (ref 4.0–10.5)
nRBC: 0 % (ref 0.0–0.2)

## 2023-08-08 LAB — BASIC METABOLIC PANEL WITH GFR
Anion gap: 9 (ref 5–15)
BUN: 8 mg/dL (ref 6–20)
CO2: 24 mmol/L (ref 22–32)
Calcium: 8.8 mg/dL — ABNORMAL LOW (ref 8.9–10.3)
Chloride: 103 mmol/L (ref 98–111)
Creatinine, Ser: 0.62 mg/dL (ref 0.61–1.24)
GFR, Estimated: >60 mL/min (ref >60)
Glucose, Bld: 138 mg/dL — ABNORMAL HIGH (ref 70–99)
Potassium: 3.6 mmol/L (ref 3.5–5.1)
Sodium: 136 mmol/L (ref 135–145)

## 2023-08-08 LAB — CK: Total CK: 15567 U/L — ABNORMAL HIGH (ref 49–397)

## 2023-08-08 NOTE — Discharge Summary (Signed)
 Physician Discharge Summary   Patient: Roberto Stanton MRN: [id number] DOB: 1995/09/23  Admit date:     08/06/2023  Discharge date: 08/08/23  Discharge Physician: Ezzard Holms   PCP: [facility]   Recommendations at discharge:  Follow-up with PCP  Discharge Diagnoses:   Rhabdomyolysis in the setting of McArdle disease:  Myophosphorylase deficiency (glycogen storage disease V, McArdle disease) (HCC) Hypokalemia:-Resolved Leukocytosis:-Resolved   Hospital Course:  Roberto Stanton is a 28 y.o. male with medical history significant of rhabdomyolysis, myophosphorylase deficiency (glycogen storage disease V, McArdle disease), transaminitis, pericarditis, kidney stone, marijuana use, former smoker, who presents with muscle pain and dark urine.  Patient was admitted for acute rhabdomyolysis management with IV fluid.  Patient admits to improvement in his clinical condition and requested for discharge today as he needs to get back home to attend to some agents needs.  Patient promised to continue with oral hydration therapy.  Consultants: None Procedures performed: None Disposition: Home Diet recommendation:  Cardiac diet DISCHARGE MEDICATION: Allergies as of 08/08/2023       Reactions   Acetaminophen  Nausea Only   Morphine  Hives        Medication List     STOP taking these medications    Voltaren 1 % Gel Generic drug: diclofenac Sodium       TAKE these medications    gabapentin 300 MG capsule Commonly known as: NEURONTIN Take 300 mg by mouth 3 (three) times daily.   naloxone  4 MG/0.1ML Liqd nasal spray kit Commonly known as: NARCAN  Place 1 spray into the nose once.   Oxycodone  HCl 10 MG Tabs Take 10 mg by mouth 4 (four) times daily as needed.        Discharge Exam: Filed Weights   08/06/23 1616  Weight: 63.5 kg   General: Not in acute distress   Heme: No neck lymph node enlargement. Cardiac: S1/S2, RRR, No murmurs, No gallops or  rubs. Respiratory: No rales, wheezing, rhonchi or rubs. GI: Soft, nondistended, nontender, no rebound pain, no organomegaly, BS present. GU: No hematuria Ext: No pitting leg edema bilaterally.  Skin: No rashes.  Neuro: Alert, oriented X3, cranial nerves II-XII grossly intact, moves all extremities normally.   Condition at discharge: good  The results of significant diagnostics from this hospitalization (including imaging, microbiology, ancillary and laboratory) are listed below for reference.   Imaging Studies: No results found.  Microbiology: Results for orders placed or performed during the hospital encounter of 01/23/23  Chlamydia/NGC rt PCR ([facility] only)     Status: None   Collection Time: 01/23/23  3:54 PM   Specimen: Urine, Clean Catch  Result Value Ref Range Status   Specimen source GC/Chlam URINE, RANDOM  Final   Chlamydia Tr NOT DETECTED NOT DETECTED Final   N gonorrhoeae NOT DETECTED NOT DETECTED Final    Comment: (NOTE) This CT/NG assay has not been evaluated in patients with a history of  hysterectomy. Performed at [facility], [address], [place], [place] [address]   Urine Culture (for pregnant, neutropenic or urologic patients or patients with an indwelling urinary catheter)     Status: None   Collection Time: 01/24/23  8:43 AM   Specimen: Urine, Clean Catch  Result Value Ref Range Status   Specimen Description   Final    URINE, CLEAN CATCH Performed at [facility], [address] [place], [place] [address]    Special Requests   Final    NONE Performed at  [facility], [address], [place], [place] [address]    Culture   Final    NO GROWTH Performed at [facility], [address], [place], [place] [address]    Report Status 01/25/2023 FINAL  Final    Labs: CBC: Recent Labs  Lab 08/06/23 1617 08/07/23 0410 08/08/23 0323  WBC 16.1* 7.9 6.1  NEUTROABS  --   --  2.9  HGB 15.2 14.0 14.9  HCT 44.6 40.6  42.3  MCV 90.1 91.0 90.8  PLT 245 206 216   Basic Metabolic Panel: Recent Labs  Lab 08/06/23 1617 08/07/23 0410 08/08/23 0323  NA 136 139 136  K 3.4* 3.6 3.6  CL 100 107 103  CO2 28 28 24   GLUCOSE 117* 107* 138*  BUN 18 11 8   CREATININE 0.83 0.69 0.62  CALCIUM 9.1 8.4* 8.8*  MG 2.1  --   --    Liver Function Tests: Recent Labs  Lab 08/06/23 1617 08/07/23 0410  AST 251* 213*  ALT 108* 99*  ALKPHOS 68 67  BILITOT 0.9 0.7  PROT 8.2* 6.3*  ALBUMIN 4.2 3.2*   CBG: No results for input(s): GLUCAP in the last 168 hours.  Discharge time spent:  37 minutes.  Signed: Ezzard Holms, MD [facility] 08/08/2023

## 2023-08-08 NOTE — Progress Notes (Signed)
D/C AVS completed and reviewed with pt. All opportunities for questions answered and clarified. IV removed. Pt will be wheeled down to car at medical mall entrance via wheelchair.

## 2023-09-22 ENCOUNTER — Other Ambulatory Visit: Payer: Self-pay

## 2023-09-22 ENCOUNTER — Encounter (HOSPITAL_COMMUNITY): Payer: Self-pay | Admitting: Pharmacy Technician

## 2023-09-22 ENCOUNTER — Inpatient Hospital Stay (HOSPITAL_COMMUNITY)
Admission: EM | Admit: 2023-09-22 | Discharge: 2023-09-24 | DRG: 558 | Discharge status: Against Medical Advice | Attending: Internal Medicine | Admitting: Internal Medicine

## 2023-09-22 DIAGNOSIS — Z87442 Personal history of urinary calculi: Secondary | ICD-10-CM

## 2023-09-22 DIAGNOSIS — Z5329 Procedure and treatment not carried out because of patient's decision for other reasons: Secondary | ICD-10-CM | POA: Diagnosis present

## 2023-09-22 DIAGNOSIS — M6282 Rhabdomyolysis: Principal | ICD-10-CM | POA: Diagnosis present

## 2023-09-22 DIAGNOSIS — Z886 Allergy status to analgesic agent status: Secondary | ICD-10-CM

## 2023-09-22 DIAGNOSIS — G894 Chronic pain syndrome: Secondary | ICD-10-CM | POA: Diagnosis present

## 2023-09-22 DIAGNOSIS — Z885 Allergy status to narcotic agent status: Secondary | ICD-10-CM

## 2023-09-22 DIAGNOSIS — F17290 Nicotine dependence, other tobacco product, uncomplicated: Secondary | ICD-10-CM | POA: Diagnosis present

## 2023-09-22 DIAGNOSIS — E7404 McArdle disease: Secondary | ICD-10-CM | POA: Diagnosis present

## 2023-09-22 DIAGNOSIS — G8929 Other chronic pain: Secondary | ICD-10-CM | POA: Diagnosis present

## 2023-09-22 DIAGNOSIS — Z79899 Other long term (current) drug therapy: Secondary | ICD-10-CM

## 2023-09-22 DIAGNOSIS — Z8249 Family history of ischemic heart disease and other diseases of the circulatory system: Secondary | ICD-10-CM

## 2023-09-22 LAB — COMPREHENSIVE METABOLIC PANEL WITH GFR
ALT: 103 U/L — ABNORMAL HIGH (ref 0–44)
AST: 124 U/L — ABNORMAL HIGH (ref 15–41)
Albumin: 3.7 g/dL (ref 3.5–5.0)
Alkaline Phosphatase: 73 U/L (ref 38–126)
Anion gap: 8 (ref 5–15)
BUN: 12 mg/dL (ref 6–20)
CO2: 25 mmol/L (ref 22–32)
Calcium: 9 mg/dL (ref 8.9–10.3)
Chloride: 101 mmol/L (ref 98–111)
Creatinine, Ser: 0.75 mg/dL (ref 0.61–1.24)
GFR, Estimated: >60 mL/min (ref >60)
Glucose, Bld: 95 mg/dL (ref 70–99)
Potassium: 3.9 mmol/L (ref 3.5–5.1)
Sodium: 134 mmol/L — ABNORMAL LOW (ref 135–145)
Total Bilirubin: 0.6 mg/dL (ref 0.0–1.2)
Total Protein: 7.9 g/dL (ref 6.5–8.1)

## 2023-09-22 LAB — URINALYSIS, ROUTINE W REFLEX MICROSCOPIC
Bilirubin Urine: NEGATIVE
Glucose, UA: NEGATIVE mg/dL
Ketones, ur: NEGATIVE mg/dL
Leukocytes,Ua: NEGATIVE
Nitrite: NEGATIVE
Protein, ur: NEGATIVE mg/dL
Specific Gravity, Urine: 1.015 (ref 1.005–1.030)
pH: 6 (ref 5.0–8.0)

## 2023-09-22 LAB — CBC
HCT: 46.6 % (ref 39.0–52.0)
Hemoglobin: 16.1 g/dL (ref 13.0–17.0)
MCH: 31.4 pg (ref 26.0–34.0)
MCHC: 34.5 g/dL (ref 30.0–36.0)
MCV: 90.8 fL (ref 80.0–100.0)
Platelets: 232 K/uL (ref 150–400)
RBC: 5.13 MIL/uL (ref 4.22–5.81)
RDW: 12.6 % (ref 11.5–15.5)
WBC: 7.7 K/uL (ref 4.0–10.5)
nRBC: 0 % (ref 0.0–0.2)

## 2023-09-22 LAB — CBG MONITORING, ED: Glucose-Capillary: 107 mg/dL — ABNORMAL HIGH (ref 70–99)

## 2023-09-22 LAB — CK: Total CK: >20000 U/L — ABNORMAL HIGH (ref 49–397)

## 2023-09-22 MED ORDER — SENNOSIDES-DOCUSATE SODIUM 8.6-50 MG PO TABS
1.0000 | ORAL_TABLET | Freq: Every evening | ORAL | Status: DC | PRN
Start: 2023-09-22 — End: 2023-09-24

## 2023-09-22 MED ORDER — OXYCODONE HCL 5 MG PO TABS
10.0000 mg | ORAL_TABLET | Freq: Once | ORAL | Status: AC
  Administered 2023-09-22: 10 mg via ORAL
  Filled 2023-09-22: qty 2

## 2023-09-22 MED ORDER — ENOXAPARIN SODIUM 40 MG/0.4ML IJ SOSY
40.0000 mg | PREFILLED_SYRINGE | INTRAMUSCULAR | Status: DC
  Filled 2023-09-22 (×2): qty 0.4

## 2023-09-22 MED ORDER — OXYCODONE HCL ER 10 MG PO T12A: 10.0000 mg | EXTENDED_RELEASE_TABLET | Freq: Two times a day (BID) | ORAL | Status: DC

## 2023-09-22 MED ORDER — ONDANSETRON HCL 4 MG PO TABS: 4.0000 mg | ORAL_TABLET | Freq: Four times a day (QID) | ORAL | Status: DC | PRN

## 2023-09-22 MED ORDER — ACETAMINOPHEN 650 MG RE SUPP: 650.0000 mg | Freq: Four times a day (QID) | RECTAL | Status: DC | PRN

## 2023-09-22 MED ORDER — LACTATED RINGERS IV BOLUS
1000.0000 mL | Freq: Once | INTRAVENOUS | Status: AC
  Administered 2023-09-22: 1000 mL via INTRAVENOUS

## 2023-09-22 MED ORDER — GABAPENTIN 300 MG PO CAPS
300.0000 mg | ORAL_CAPSULE | Freq: Three times a day (TID) | ORAL | Status: DC
  Administered 2023-09-22 – 2023-09-24 (×4): 300 mg via ORAL
  Filled 2023-09-22 (×6): qty 1

## 2023-09-22 MED ORDER — SODIUM CHLORIDE 0.9 % IV SOLN: INTRAVENOUS | Status: AC

## 2023-09-22 MED ORDER — OXYCODONE HCL 5 MG PO TABS
10.0000 mg | ORAL_TABLET | Freq: Four times a day (QID) | ORAL | Status: DC | PRN
  Administered 2023-09-22: 10 mg via ORAL
  Administered 2023-09-23 – 2023-09-24 (×6): 20 mg via ORAL
  Filled 2023-09-22 (×4): qty 4
  Filled 2023-09-22: qty 2
  Filled 2023-09-22 (×2): qty 4

## 2023-09-22 MED ORDER — OXYCODONE HCL ER 10 MG PO T12A
10.0000 mg | EXTENDED_RELEASE_TABLET | Freq: Once | ORAL | Status: DC
  Filled 2023-09-22: qty 1

## 2023-09-22 MED ORDER — ONDANSETRON HCL 4 MG/2ML IJ SOLN: 4.0000 mg | Freq: Four times a day (QID) | INTRAMUSCULAR | Status: DC | PRN

## 2023-09-22 MED ORDER — ACETAMINOPHEN 325 MG PO TABS
650.0000 mg | ORAL_TABLET | Freq: Four times a day (QID) | ORAL | Status: DC | PRN
Start: 2023-09-22 — End: 2023-09-24

## 2023-09-22 MED ORDER — OXYCODONE HCL 5 MG PO TABS: 10.0000 mg | ORAL_TABLET | Freq: Four times a day (QID) | ORAL | Status: DC | PRN

## 2023-09-22 NOTE — ED Notes (Signed)
 Pt stated they did not want to dress into a hospital gown.

## 2023-09-22 NOTE — Hospital Course (Signed)
 Roberto Stanton is a 28 y.o. male with medical history significant for Myophosphorylase deficiency (glycogen-storage disease 5, McArdle disease) with recurrent nontraumatic rhabdomyolysis, chronic pain syndrome who is admitted with recurrent nontraumatic rhabdomyolysis.

## 2023-09-22 NOTE — H&P (Signed)
 History and Physical    Roberto Stanton FMW:[id number] DOB: 04-Feb-1996 DOA: 09/22/2023  PCP: [facility]  Patient coming from: Home  I have personally briefly reviewed patient's old medical records in [facility]  Chief Complaint: Muscle pain, rhabdomyolysis  HPI: Roberto Stanton is a 28 y.o. male with medical history significant for Myophosphorylase deficiency (glycogen-storage disease 5, McArdle disease) with recurrent nontraumatic rhabdomyolysis, chronic pain syndrome who presented to the ED for evaluation of pain throughout his body similar to prior episodes of rhabdomyolysis.  Patient is admitted frequently for nontraumatic rhabdomyolysis in the setting of McArdle disease which was formally diagnosed September 2022.  Last admitted 6/17-6/19 at [facility] for the same.  Baseline CK is high and often >20,000 on initial presentation.  Patient reports feeling a flareup of his rhabdomyolysis last night.  He cannot identify any obvious trigger.  He avoids intense exercise and has not been out in the heat.  He is having generalized muscle pain all over including abdomen.  He reports good urine output but had dark appearing urine prior to arrival.  He denies fevers, chills, diaphoresis, chest pain, dyspnea.  ED Course  Labs/Imaging on admission: I have personally reviewed following labs and imaging studies.  Initial vitals showed BP 135/97, pulse 104, RR 18, temp 98.7 F, SpO2 100% on room air.  Labs showed CK >20,000, BUN 12, creatinine 0.75, AST 124, ALT 103, alk phos 73, total bilirubin 0.6, sodium 134, potassium 3.9, bicarb 25, WBC 7.7, hemoglobin 16.1, platelets 232.  Urinalysis with moderate hemoglobin, 0-5 RBCs.  Negative nitrites, negative leukocytes, 0-5 WBCs, rare bacteria.  Patient was given 1 L LR and Oxy IR 10 mg.  The hospitalist service was consulted to admit.   Review of Systems: All systems reviewed and are negative except as documented in history of  present illness above.   Past Medical History:  Diagnosis Date   Kidney stones    McArdle disease (HCC) 09/28/2021   Pericarditis    Rhabdomyolysis     Past Surgical History:  Procedure Laterality Date   leg muscle biopsy      Social History: Social History   Tobacco Use   Smoking status: Former    Types: Cigarettes   Smokeless tobacco: Never  Vaping Use   Vaping status: Every Day   Substances: Nicotine  Substance Use Topics   Alcohol use: Never    Comment: occasional   Drug use: Yes    Types: Marijuana    Comment: prescribed oxy   Allergies  Allergen Reactions   Acetaminophen  Nausea Only   Morphine  Hives    Family History  Problem Relation Age of Onset   Heart disease Father      Prior to Admission medications   Medication Sig Start Date End Date Taking? Authorizing Provider  gabapentin  (NEURONTIN ) 300 MG capsule Take 300 mg by mouth 3 (three) times daily. 06/01/23   [provider]  naloxone  (NARCAN ) nasal spray 4 mg/0.1 mL Place 1 spray into the nose once. Patient not taking: Reported on 08/06/2023    [provider]  Oxycodone  HCl 10 MG TABS Take 10 mg by mouth 4 (four) times daily as needed. 07/15/23   [provider]    Physical Exam: Vitals:   09/22/23 1537 09/22/23 1936  BP: (!) 135/97 (!) 119/90  Pulse: (!) 104 92  Resp: 18 18  Temp: 98.7 F (37.1 C) 98.1 F (36.7 C)  TempSrc:  Oral  SpO2: 100% 98%  Constitutional: Resting in bed, NAD, calm, comfortable Eyes: EOMI, lids and conjunctivae normal ENMT: Mucous membranes are moist. Posterior pharynx clear of any exudate or lesions.Normal dentition.  Neck: normal, supple, no masses. Respiratory: clear to auscultation bilaterally, no wheezing, no crackles. Normal respiratory effort. No accessory muscle use.  Cardiovascular: Regular rate and rhythm, no murmurs / rubs / gallops. No extremity edema. 2+ pedal pulses. Abdomen: Soft, generalized tenderness to palpation, no  masses palpated. Musculoskeletal: no clubbing / cyanosis. No joint deformity upper and lower extremities. Good ROM, no contractures. Normal muscle tone.  General muscle tenderness. Skin: no rashes, lesions, ulcers. No induration Neurologic: Sensation intact. Strength 5/5 in all 4.  Psychiatric: Normal judgment and insight. Alert and oriented x 3. Normal mood.   EKG: Personally reviewed. Sinus rhythm, rate 103, RAE, rate is slower when compared to previous.  Assessment/Plan Principal Problem:   Non-traumatic rhabdomyolysis, recurrent Active Problems:   Myophosphorylase deficiency (glycogen storage disease V, McArdle disease) (HCC)   Chronic pain   Roberto Stanton is a 28 y.o. male with medical history significant for Myophosphorylase deficiency (glycogen-storage disease 5, McArdle disease) with recurrent nontraumatic rhabdomyolysis, chronic pain syndrome who is admitted with recurrent nontraumatic rhabdomyolysis.  Assessment and Plan: Recurrent nontraumatic rhabdomyolysis in setting of McArdle disease: Presenting CK >20,000.  Patient's CK is elevated at baseline.  He is having acute on chronic generalized muscle pain.  Renal function and urine output remains intact. - Received 1 L LR in the ED - Start IV normal saline@150  mL/hour - Monitor UOP, recheck CK level in a.m.  AST/ALT elevation: Mild and in the setting of rhabdomyolysis.  Continue to monitor.  Chronic pain syndrome: PDMP reviewed, fills consistent prescriptions for oxycodone  IR 10 mg #112 from same provider.  Last filled 09/10/2023. - Continue home oxycodone , can increase to 20 mg for severe pain in acute setting - Continue gabapentin    DVT prophylaxis: enoxaparin  (LOVENOX ) injection 40 mg Start: 09/23/23 1000 Code Status: Full code Family Communication: Discussed with patient, he has discussed with family Disposition Plan: From home and likely discharge to home pending clinical progress Consults called: None Severity of  Illness: The appropriate patient status for this patient is OBSERVATION. Observation status is judged to be reasonable and necessary in order to provide the required intensity of service to ensure the patient's safety. The patient's presenting symptoms, physical exam findings, and initial radiographic and laboratory data in the context of their medical condition is felt to place them at decreased risk for further clinical deterioration. Furthermore, it is anticipated that the patient will be medically stable for discharge from the hospital within 2 midnights of admission.   Jorie Blanch MD [facility]  If 7PM-7AM, please contact night-coverage [email or web address]  09/22/2023, 7:56 PM

## 2023-09-22 NOTE — ED Provider Notes (Signed)
 Roberto Stanton Provider Note   CSN: [id number] Arrival date & time: 09/22/23  1533     Patient presents with: Weakness   Roberto Stanton is a 28 y.o. male who presents out of concern for increasing muscle cramping/pain as well as darkening urine over the last 2 days.  He has a history of McArdle disease, review of previous records shows he was admitted on 19 June secondary to rhabdomyolysis.  He is also using oxycodone  10 mg every 4 hours as needed for pain, also using gabapentin  300 mg 3 times daily.  He endorses that over the last 2 days he has had increasing pain in the lower and upper extremities, and has increasingly dark urine over the past several days as well.  States that when he had a previous presentation for rhabdomyolysis this is how this began.  Also seeks pain management, stating that he no longer has any oxycodone .    Weakness      Prior to Admission medications   Medication Sig Start Date End Date Taking? Authorizing Provider  gabapentin  (NEURONTIN ) 300 MG capsule Take 300 mg by mouth 3 (three) times daily. 06/01/23  Yes [provider]  naloxone  (NARCAN ) nasal spray 4 mg/0.1 mL Place 1 spray into the nose daily as needed (for overdose).   Yes [provider]  Oxycodone  HCl 10 MG TABS Take 10 mg by mouth 4 (four) times daily as needed (for pain). 07/15/23  Yes [provider]    Allergies: Acetaminophen  and Morphine     Review of Systems  Neurological:  Positive for weakness.  All other systems reviewed and are negative.   Updated Vital Signs BP 119/76 (BP Location: Right Arm)   Pulse 83   Temp 99.1 F (37.3 C) (Oral)   Resp 17   SpO2 98%   Physical Exam Vitals and nursing note reviewed.  Constitutional:      General: He is not in acute distress.    Appearance: Normal appearance.  HENT:     Head: Normocephalic and atraumatic.     Mouth/Throat:     Mouth: Mucous membranes are moist.      Pharynx: Oropharynx is clear.  Eyes:     Extraocular Movements: Extraocular movements intact.     Conjunctiva/sclera: Conjunctivae normal.     Pupils: Pupils are equal, round, and reactive to light.  Cardiovascular:     Rate and Rhythm: Normal rate and regular rhythm.     Pulses: Normal pulses.     Heart sounds: Normal heart sounds. No murmur heard.    No friction rub. No gallop.  Pulmonary:     Effort: Pulmonary effort is normal.     Breath sounds: Normal breath sounds.  Abdominal:     General: Abdomen is flat. Bowel sounds are normal.     Palpations: Abdomen is soft.     Tenderness: There is generalized abdominal tenderness.     Comments: Generalized abdominal tenderness without rebound.  Musculoskeletal:        General: Normal range of motion.     Cervical back: Normal range of motion and neck supple.     Right lower leg: No edema.     Left lower leg: No edema.     Comments: Endorses tenderness to palpation of the calf muscles as well as tenderness to palpation of the biceps and triceps bilaterally.  Skin:    General: Skin is warm and dry.     Capillary Refill:  Capillary refill takes less than 2 seconds.  Neurological:     General: No focal deficit present.     Mental Status: He is alert. Mental status is at baseline.  Psychiatric:        Mood and Affect: Mood normal.     (all labs ordered are listed, but only abnormal results are displayed) Labs Reviewed  COMPREHENSIVE METABOLIC PANEL WITH GFR - Abnormal; Notable for the following components:      Result Value   Sodium 134 (*)    AST 124 (*)    ALT 103 (*)    All other components within normal limits  URINALYSIS, ROUTINE W REFLEX MICROSCOPIC - Abnormal; Notable for the following components:   Hgb urine dipstick MODERATE (*)    Bacteria, UA RARE (*)    All other components within normal limits  CK - Abnormal; Notable for the following components:   Total CK >20,000 (*)    All other components within normal limits   CBG MONITORING, ED - Abnormal; Notable for the following components:   Glucose-Capillary 107 (*)    All other components within normal limits  CBC  COMPREHENSIVE METABOLIC PANEL WITH GFR  CBC  CK    EKG: None  Radiology: No results found.   Procedures   Medications Ordered in the ED  0.9 %  sodium chloride  infusion ( Intravenous New Bag/Given 09/22/23 1948)  gabapentin  (NEURONTIN ) capsule 300 mg (has no administration in time range)  enoxaparin  (LOVENOX ) injection 40 mg (has no administration in time range)  acetaminophen  (TYLENOL ) tablet 650 mg (has no administration in time range)    Or  acetaminophen  (TYLENOL ) suppository 650 mg (has no administration in time range)  ondansetron  (ZOFRAN ) tablet 4 mg (has no administration in time range)    Or  ondansetron  (ZOFRAN ) injection 4 mg (has no administration in time range)  senna-docusate (Senokot-S) tablet 1 tablet (has no administration in time range)  oxyCODONE  (Oxy IR/ROXICODONE ) immediate release tablet 10-20 mg (has no administration in time range)  lactated ringers  bolus 1,000 mL (0 mLs Intravenous Stopped 09/22/23 1947)  oxyCODONE  (Oxy IR/ROXICODONE ) immediate release tablet 10 mg (10 mg Oral Given 09/22/23 1745)                                    Medical Decision Making Amount and/or Complexity of Data Reviewed Labs: ordered.  Risk Prescription drug management. Decision regarding hospitalization.   Medical Decision Making:   Roberto Stanton is a 28 y.o. male who presented to the ED today with muscle pain and dark urine detailed above.     Complete initial physical exam performed, notably the patient  was alert oriented in no apparent distress.  Physical exam as noted with tenderness elicited along the calf bilaterally as well as the upper arms bilateral.    Reviewed and confirmed nursing documentation for past medical history, family history, social history.    Initial Assessment:   With the patient's  presentation of dark urine and extremity discomfort, most likely diagnosis is rhabdomyolysis secondary to his chronic condition.   Initial Plan:  Obtain CK to evaluate for rhabdomyolysis Screening labs including CBC and Metabolic panel to evaluate for infectious or metabolic etiology of disease.  Urinalysis with reflex culture ordered to evaluate for UTI or relevant urologic/nephrologic pathology.  Obtain EKG secondary to potential rhabdomyolysis Initiate IV fluids for rhabdo Objective evaluation as below reviewed  Initial Study Results:   Laboratory  All laboratory results reviewed without evidence of clinically relevant pathology.   Exceptions include: CK greater than 20,000, elevated AST and elevated ALT  EKG EKG was reviewed independently. Rate, rhythm, axis, intervals all examined and without medically relevant abnormality. ST segments without concerns for elevations.      Consults: Case discussed with Dr. Tobie with the hospitalist team who accepts patient for admission  Reassessment and Plan:   Based on assessment of this patient, along with objective data obtained, believe this patient's symptoms are secondary to rhabdomyolysis due to his chronic conditions.  IV fluids have been administered and he has been accepted for admission by hospitalist team for continued IV hydration and monitoring of his rhabdomyolysis.       Final diagnoses:  Non-traumatic rhabdomyolysis  McArdle's disease [facility])    ED Discharge Orders     None          Myriam Dorn BROCKS, PA 09/22/23 2025    Lowther, Amy, DO 09/22/23 2208

## 2023-09-22 NOTE — ED Notes (Signed)
 6N given courtesy call.

## 2023-09-22 NOTE — ED Triage Notes (Signed)
 Pt states he has a muscle condition and goes into Rhabdo a lot. Has been fele weak the past few days and thinks he may need to be admitted for rhabdo.

## 2023-09-23 DIAGNOSIS — Z5329 Procedure and treatment not carried out because of patient's decision for other reasons: Secondary | ICD-10-CM | POA: Diagnosis present

## 2023-09-23 DIAGNOSIS — E7404 McArdle disease: Secondary | ICD-10-CM | POA: Diagnosis present

## 2023-09-23 DIAGNOSIS — Z885 Allergy status to narcotic agent status: Secondary | ICD-10-CM | POA: Diagnosis not present

## 2023-09-23 DIAGNOSIS — Z886 Allergy status to analgesic agent status: Secondary | ICD-10-CM | POA: Diagnosis not present

## 2023-09-23 DIAGNOSIS — M6282 Rhabdomyolysis: Secondary | ICD-10-CM | POA: Diagnosis present

## 2023-09-23 DIAGNOSIS — Z87442 Personal history of urinary calculi: Secondary | ICD-10-CM | POA: Diagnosis not present

## 2023-09-23 DIAGNOSIS — G894 Chronic pain syndrome: Secondary | ICD-10-CM | POA: Diagnosis present

## 2023-09-23 DIAGNOSIS — Z8249 Family history of ischemic heart disease and other diseases of the circulatory system: Secondary | ICD-10-CM | POA: Diagnosis not present

## 2023-09-23 DIAGNOSIS — Z79899 Other long term (current) drug therapy: Secondary | ICD-10-CM | POA: Diagnosis not present

## 2023-09-23 DIAGNOSIS — F17290 Nicotine dependence, other tobacco product, uncomplicated: Secondary | ICD-10-CM | POA: Diagnosis present

## 2023-09-23 LAB — COMPREHENSIVE METABOLIC PANEL WITH GFR
ALT: 105 U/L — ABNORMAL HIGH (ref 0–44)
AST: 160 U/L — ABNORMAL HIGH (ref 15–41)
Albumin: 3.1 g/dL — ABNORMAL LOW (ref 3.5–5.0)
Alkaline Phosphatase: 64 U/L (ref 38–126)
Anion gap: 10 (ref 5–15)
BUN: 9 mg/dL (ref 6–20)
CO2: 22 mmol/L (ref 22–32)
Calcium: 8.6 mg/dL — ABNORMAL LOW (ref 8.9–10.3)
Chloride: 107 mmol/L (ref 98–111)
Creatinine, Ser: 0.77 mg/dL (ref 0.61–1.24)
GFR, Estimated: >60 mL/min (ref >60)
Glucose, Bld: 82 mg/dL (ref 70–99)
Potassium: 3.9 mmol/L (ref 3.5–5.1)
Sodium: 139 mmol/L (ref 135–145)
Total Bilirubin: 0.8 mg/dL (ref 0.0–1.2)
Total Protein: 6.4 g/dL — ABNORMAL LOW (ref 6.5–8.1)

## 2023-09-23 LAB — CBC
HCT: 42.5 % (ref 39.0–52.0)
Hemoglobin: 14.6 g/dL (ref 13.0–17.0)
MCH: 31.4 pg (ref 26.0–34.0)
MCHC: 34.4 g/dL (ref 30.0–36.0)
MCV: 91.4 fL (ref 80.0–100.0)
Platelets: 187 K/uL (ref 150–400)
RBC: 4.65 MIL/uL (ref 4.22–5.81)
RDW: 12.8 % (ref 11.5–15.5)
WBC: 6 K/uL (ref 4.0–10.5)
nRBC: 0 % (ref 0.0–0.2)

## 2023-09-23 LAB — CK: Total CK: >20000 U/L — ABNORMAL HIGH (ref 49–397)

## 2023-09-23 MED ORDER — IPRATROPIUM-ALBUTEROL 0.5-2.5 (3) MG/3ML IN SOLN: 3.0000 mL | RESPIRATORY_TRACT | Status: DC | PRN

## 2023-09-23 MED ORDER — POLYETHYLENE GLYCOL 3350 17 G PO PACK
17.0000 g | PACK | Freq: Every day | ORAL | Status: DC
  Administered 2023-09-23: 17 g via ORAL
  Filled 2023-09-23 (×2): qty 1

## 2023-09-23 MED ORDER — HYDRALAZINE HCL 20 MG/ML IJ SOLN: 10.0000 mg | INTRAMUSCULAR | Status: DC | PRN

## 2023-09-23 MED ORDER — SODIUM CHLORIDE 0.9 % IV SOLN: INTRAVENOUS | Status: DC

## 2023-09-23 MED ORDER — METOPROLOL TARTRATE 5 MG/5ML IV SOLN: 5.0000 mg | INTRAVENOUS | Status: DC | PRN

## 2023-09-23 MED ORDER — GLUCAGON HCL RDNA (DIAGNOSTIC) 1 MG IJ SOLR: 1.0000 mg | INTRAMUSCULAR | Status: DC | PRN

## 2023-09-23 NOTE — TOC Initial Note (Signed)
 Transition of Care [facility] - Initial/Assessment Note    Patient Details  Name: Roberto Stanton MRN: [id number] Date of Birth: June 12, 1995  Transition of Care [facility]) CM/SW Contact:    Jeoffrey LITTIE Moose, LCSW Phone Number: 09/23/2023, 8:56 AM  Clinical Narrative:                 Pt admitted from home due to muscle condition. No current TOC needs, please consult as needs arise.         Patient Goals and CMS Choice            Expected Discharge Plan and Services                                              Prior Living Arrangements/Services                       Activities of Daily Living      Permission Sought/Granted                  Emotional Assessment              Admission diagnosis:  McArdle's disease (HCC) [E74.04] Non-traumatic rhabdomyolysis [M62.82] Patient Active Problem List   Diagnosis Date Noted   Leukocytosis 08/06/2023   Dental infection 06/22/2023   Epididymitis 01/24/2023   Rhabdomyolysis 01/24/2023   Opioid dependence (HCC) 01/24/2023   Testicular pain 01/23/2023   Left hydrocele 01/23/2023   Hypokalemia 04/29/2022   Hyponatremia 04/01/2022   Chronic pain 03/09/2022   Large tonsils 03/04/2022   Elevated CK 02/11/2022   Muscle cramps 01/27/2022   Non-traumatic rhabdomyolysis, recurrent 01/26/2022   Strep pharyngitis 10/04/2021   Myophosphorylase deficiency (glycogen storage disease V, McArdle disease) (HCC) 09/28/2021   Transaminitis 09/26/2021   Acute respiratory failure (HCC) 01/08/2020   PCP:  [facility] Pharmacy:   [facility] [place] [place] - [address] [place] [address] Phone: [phone] Fax: [phone]     Social Drivers of Health (SDOH) Social History: SDOH Screenings   Food Insecurity: No Food Insecurity (08/07/2023)  Housing: Low Risk  (08/07/2023)  Transportation Needs: No Transportation Needs (08/07/2023)  Utilities: Not At Risk (08/07/2023)   Financial Resource Strain: Low Risk  (05/04/2022)   Received from [facility]  Physical Activity: Sufficiently Active (10/30/2019)   Received from [facility]  Social Connections: Unknown (10/30/2019)   Received from [facility]  Stress: No Stress Concern Present (10/30/2019)   Received from [facility]  Tobacco Use: Medium Risk (09/22/2023)  Health Literacy: Low Risk  (10/30/2019)   Received from [facility]   SDOH Interventions:     Readmission Risk Interventions     No data to display

## 2023-09-23 NOTE — Plan of Care (Signed)

## 2023-09-23 NOTE — Progress Notes (Signed)
 PROGRESS NOTE    Roberto Stanton  FMW:[id number] DOB: 25-May-1995 DOA: 09/22/2023 PCP: [facility]    Brief Narrative:   28 y.o. male with medical history significant for Myophosphorylase deficiency (glycogen-storage disease 5, McArdle disease) with recurrent nontraumatic rhabdomyolysis, chronic pain syndrome who presented to the ED for evaluation of pain throughout his body similar to prior episodes of rhabdomyolysis.    Assessment & Plan:  Principal Problem:   Non-traumatic rhabdomyolysis, recurrent Active Problems:   Myophosphorylase deficiency (glycogen storage disease V, McArdle disease) (HCC)   Chronic pain    Recurrent nontraumatic rhabdomyolysis in setting of McArdle disease: Presenting CK greater than 20,000, continue to trend.  Supportive care, aggressive IV fluids.  Daily CK levels.  Monitor renal function and LFTs  Labs pending.    AST/ALT elevation: In the setting of rhabdomyolysis.  Will continue to monitor   Chronic pain syndrome: PDMP reviewed by admitting provider.  Continue oxycodone  and adjust as necessary, gabapentin .  Bowel regimen    DVT prophylaxis: enoxaparin  (LOVENOX )  Code Status: Full code Family Communication: Discussed with patient, he has discussed with family Disposition Plan: From home and likely discharge to home pending clinical progres    Subjective: Doing ok, some muscle aches.    Examination:  General exam: Appears calm and comfortable  Respiratory system: Clear to auscultation. Respiratory effort normal. Cardiovascular system: S1 & S2 heard, RRR. No JVD, murmurs, rubs, gallops or clicks. No pedal edema. Gastrointestinal system: Abdomen is nondistended, soft and nontender. No organomegaly or masses felt. Normal bowel sounds heard. Central nervous system: Alert and oriented. No focal neurological deficits. Extremities: Symmetric 5 x 5 power. Skin: No rashes, lesions or ulcers Psychiatry: Judgement and insight  appear normal. Mood & affect appropriate.                Diet Orders (From admission, onward)     Start     Ordered   09/22/23 1856  Diet regular Fluid consistency: Thin  Diet effective now       Question:  Fluid consistency:  Answer:  Thin   09/22/23 1856            Objective: Vitals:   09/22/23 1936 09/22/23 2016 09/23/23 0039 09/23/23 0637  BP: (!) 119/90 119/76 112/78 123/80  Pulse: 92 83 79 79  Resp: 18 17 16 18   Temp: 98.1 F (36.7 C) 99.1 F (37.3 C) 97.8 F (36.6 C) 98.3 F (36.8 C)  TempSrc: Oral Oral Oral Oral  SpO2: 98% 98% 99% 100%    Intake/Output Summary (Last 24 hours) at 09/23/2023 9071 Last data filed at 09/23/2023 9485 Gross per 24 hour  Intake 2051.6 ml  Output --  Net 2051.6 ml   There were no vitals filed for this visit.  Scheduled Meds:  enoxaparin  (LOVENOX ) injection  40 mg Subcutaneous Q24H   gabapentin   300 mg Oral TID   polyethylene glycol  17 g Oral Daily   Continuous Infusions:  sodium chloride       Nutritional status     There is no height or weight on file to calculate BMI.  Data Reviewed:   CBC: Recent Labs  Lab 09/22/23 1547 09/23/23 0730  WBC 7.7 6.0  HGB 16.1 14.6  HCT 46.6 42.5  MCV 90.8 91.4  PLT 232 187   Basic Metabolic Panel: Recent Labs  Lab 09/22/23 1547  NA 134*  K 3.9  CL 101  CO2 25  GLUCOSE 95  BUN 12  CREATININE  0.75  CALCIUM 9.0   GFR: CrCl cannot be calculated (Unknown ideal weight.). Liver Function Tests: Recent Labs  Lab 09/22/23 1547  AST 124*  ALT 103*  ALKPHOS 73  BILITOT 0.6  PROT 7.9  ALBUMIN 3.7   No results for input(s): LIPASE, AMYLASE in the last 168 hours. No results for input(s): AMMONIA in the last 168 hours. Coagulation Profile: No results for input(s): INR, PROTIME in the last 168 hours. Cardiac Enzymes: Recent Labs  Lab 09/22/23 1547  CKTOTAL >20,000*   BNP (last 3 results) No results for input(s): PROBNP in the last 8760  hours. HbA1C: No results for input(s): HGBA1C in the last 72 hours. CBG: Recent Labs  Lab 09/22/23 1810  GLUCAP 107*   Lipid Profile: No results for input(s): CHOL, HDL, LDLCALC, TRIG, CHOLHDL, LDLDIRECT in the last 72 hours. Thyroid  Function Tests: No results for input(s): TSH, T4TOTAL, FREET4, T3FREE, THYROIDAB in the last 72 hours. Anemia Panel: No results for input(s): VITAMINB12, FOLATE, FERRITIN, TIBC, IRON, RETICCTPCT in the last 72 hours. Sepsis Labs: No results for input(s): PROCALCITON, LATICACIDVEN in the last 168 hours.  No results found for this or any previous visit (from the past 240 hours).       Radiology Studies: No results found.         LOS: 0 days   Time spent= 35 mins    Roberto JAYSON Dare, MD [facility]  If 7PM-7AM, please contact night-coverage  09/23/2023, 9:28 AM

## 2023-09-23 NOTE — Plan of Care (Signed)
   Problem: Health Behavior/Discharge Planning: Goal: Ability to manage health-related needs will improve Outcome: Progressing   Problem: Clinical Measurements: Goal: Ability to maintain clinical measurements within normal limits will improve Outcome: Progressing Goal: Will remain free from infection Outcome: Progressing

## 2023-09-23 NOTE — Plan of Care (Signed)
  Problem: Pain Managment: Goal: General experience of comfort will improve and/or be controlled Outcome: Progressing   Problem: Safety: Goal: Ability to remain free from injury will improve Outcome: Progressing

## 2023-09-24 DIAGNOSIS — M6282 Rhabdomyolysis: Secondary | ICD-10-CM | POA: Diagnosis not present

## 2023-09-24 LAB — COMPREHENSIVE METABOLIC PANEL WITH GFR
ALT: 115 U/L — ABNORMAL HIGH (ref 0–44)
AST: 154 U/L — ABNORMAL HIGH (ref 15–41)
Albumin: 3.3 g/dL — ABNORMAL LOW (ref 3.5–5.0)
Alkaline Phosphatase: 70 U/L (ref 38–126)
Anion gap: 6 (ref 5–15)
BUN: 7 mg/dL (ref 6–20)
CO2: 26 mmol/L (ref 22–32)
Calcium: 9.1 mg/dL (ref 8.9–10.3)
Chloride: 103 mmol/L (ref 98–111)
Creatinine, Ser: 0.69 mg/dL (ref 0.61–1.24)
GFR, Estimated: >60 mL/min (ref >60)
Glucose, Bld: 97 mg/dL (ref 70–99)
Potassium: 3.5 mmol/L (ref 3.5–5.1)
Sodium: 135 mmol/L (ref 135–145)
Total Bilirubin: 0.7 mg/dL (ref 0.0–1.2)
Total Protein: 7.1 g/dL (ref 6.5–8.1)

## 2023-09-24 LAB — CBC
HCT: 41.9 % (ref 39.0–52.0)
Hemoglobin: 14.7 g/dL (ref 13.0–17.0)
MCH: 31.3 pg (ref 26.0–34.0)
MCHC: 35.1 g/dL (ref 30.0–36.0)
MCV: 89.3 fL (ref 80.0–100.0)
Platelets: 198 K/uL (ref 150–400)
RBC: 4.69 MIL/uL (ref 4.22–5.81)
RDW: 12.7 % (ref 11.5–15.5)
WBC: 5.8 K/uL (ref 4.0–10.5)
nRBC: 0 % (ref 0.0–0.2)

## 2023-09-24 LAB — PHOSPHORUS: Phosphorus: 4.1 mg/dL (ref 2.5–4.6)

## 2023-09-24 LAB — MAGNESIUM: Magnesium: 1.6 mg/dL — ABNORMAL LOW (ref 1.7–2.4)

## 2023-09-24 LAB — CK: Total CK: 19400 U/L — ABNORMAL HIGH (ref 49–397)

## 2023-09-24 MED ORDER — MAGNESIUM SULFATE 2 GM/50ML IV SOLN
2.0000 g | Freq: Once | INTRAVENOUS | Status: AC
  Administered 2023-09-24: 2 g via INTRAVENOUS
  Filled 2023-09-24: qty 50

## 2023-09-24 NOTE — Progress Notes (Signed)
 PROGRESS NOTE    Roberto Stanton  FMW:[id number] DOB: [birth year] DOA: 09/22/2023 PCP: [facility]    Brief Narrative:   28 y.o. male with medical history significant for Myophosphorylase deficiency (glycogen-storage disease 5, McArdle disease) with recurrent nontraumatic rhabdomyolysis, chronic pain syndrome who presented to the ED for evaluation of pain throughout his body similar to prior episodes of rhabdomyolysis.    Assessment & Plan:  Principal Problem:   Non-traumatic rhabdomyolysis, recurrent Active Problems:   Myophosphorylase deficiency (glycogen storage disease V, McArdle disease) (HCC)   Chronic pain    Recurrent nontraumatic rhabdomyolysis in setting of McArdle disease: Presenting CK greater than 20,000, continue to trend.  Supportive care, aggressive IV fluids.  Daily CK levels.  Monitor renal function and LFTs    AST/ALT elevation: In the setting of rhabdomyolysis.  Will continue to monitor   Chronic pain syndrome: PDMP reviewed by admitting provider.  Continue oxycodone  and adjust as necessary, gabapentin .  Bowel regimen    DVT prophylaxis: enoxaparin  (LOVENOX )  Code Status: Full code Family Communication: Discussed with patient, he has discussed with family Disposition Plan: From home and likely discharge to home pending clinical progres    Subjective: Asking to increase his pain medication.  When I walked in the room he appeared comfortable otherwise  Have notified RN Despite of IV fluid order, IV fluids were not running. Examination:  General exam: Appears calm and comfortable  Respiratory system: Clear to auscultation. Respiratory effort normal. Cardiovascular system: S1 & S2 heard, RRR. No JVD, murmurs, rubs, gallops or clicks. No pedal edema. Gastrointestinal system: Abdomen is nondistended, soft and nontender. No organomegaly or masses felt. Normal bowel sounds heard. Central nervous system: Alert and oriented. No focal  neurological deficits. Extremities: Symmetric 5 x 5 power. Skin: No rashes, lesions or ulcers Psychiatry: Judgement and insight appear normal. Mood & affect appropriate.                Diet Orders (From admission, onward)     Start     Ordered   09/22/23 1856  Diet regular Fluid consistency: Thin  Diet effective now       Question:  Fluid consistency:  Answer:  Thin   09/22/23 1856            Objective: Vitals:   09/23/23 1708 09/23/23 2118 09/24/23 0340 09/24/23 0817  BP: 120/74 128/80 119/81 117/66  Pulse: 76 77 73 66  Resp: 18   17  Temp: 98.2 F (36.8 C) 98 F (36.7 C) 97.7 F (36.5 C) 97.7 F (36.5 C)  TempSrc: Oral Oral Oral   SpO2: 100% 100% 99% 100%    Intake/Output Summary (Last 24 hours) at 09/24/2023 1118 Last data filed at 09/24/2023 0900 Gross per 24 hour  Intake 1360.07 ml  Output 400 ml  Net 960.07 ml   There were no vitals filed for this visit.  Scheduled Meds:  enoxaparin  (LOVENOX ) injection  40 mg Subcutaneous Q24H   gabapentin   300 mg Oral TID   polyethylene glycol  17 g Oral Daily   Continuous Infusions:  sodium chloride  150 mL/hr at 09/24/23 1013   magnesium  sulfate bolus IVPB 2 g (09/24/23 1020)    Nutritional status     There is no height or weight on file to calculate BMI.  Data Reviewed:   CBC: Recent Labs  Lab 09/22/23 1547 09/23/23 0730 09/24/23 0653  WBC 7.7 6.0 5.8  HGB 16.1 14.6 14.7  HCT 46.6 42.5 41.9  MCV 90.8 91.4 89.3  PLT 232 187 198   Basic Metabolic Panel: Recent Labs  Lab 09/22/23 1547 09/23/23 0749 09/24/23 0653  NA 134* 139 135  K 3.9 3.9 3.5  CL 101 107 103  CO2 25 22 26   GLUCOSE 95 82 97  BUN 12 9 7   CREATININE 0.75 0.77 0.69  CALCIUM 9.0 8.6* 9.1  MG  --   --  1.6*  PHOS  --   --  4.1   GFR: CrCl cannot be calculated (Unknown ideal weight.). Liver Function Tests: Recent Labs  Lab 09/22/23 1547 09/23/23 0749 09/24/23 0653  AST 124* 160* 154*  ALT 103* 105* 115*  ALKPHOS  73 64 70  BILITOT 0.6 0.8 0.7  PROT 7.9 6.4* 7.1  ALBUMIN 3.7 3.1* 3.3*   No results for input(s): LIPASE, AMYLASE in the last 168 hours. No results for input(s): AMMONIA in the last 168 hours. Coagulation Profile: No results for input(s): INR, PROTIME in the last 168 hours. Cardiac Enzymes: Recent Labs  Lab 09/22/23 1547 09/23/23 0749 09/24/23 0653  CKTOTAL >20,000* >20,000* 19,400*   BNP (last 3 results) No results for input(s): PROBNP in the last 8760 hours. HbA1C: No results for input(s): HGBA1C in the last 72 hours. CBG: Recent Labs  Lab 09/22/23 1810  GLUCAP 107*   Lipid Profile: No results for input(s): CHOL, HDL, LDLCALC, TRIG, CHOLHDL, LDLDIRECT in the last 72 hours. Thyroid  Function Tests: No results for input(s): TSH, T4TOTAL, FREET4, T3FREE, THYROIDAB in the last 72 hours. Anemia Panel: No results for input(s): VITAMINB12, FOLATE, FERRITIN, TIBC, IRON, RETICCTPCT in the last 72 hours. Sepsis Labs: No results for input(s): PROCALCITON, LATICACIDVEN in the last 168 hours.  No results found for this or any previous visit (from the past 240 hours).       Radiology Studies: No results found.         LOS: 1 day   Time spent= 35 mins    Burgess JAYSON Dare, MD [facility]  If 7PM-7AM, please contact night-coverage  09/24/2023, 11:18 AM

## 2023-12-12 NOTE — ED Provider Notes (Signed)
 [facility] Emergency Department Provider Note   ED Clinical Impression   Final diagnoses:  Non-traumatic rhabdomyolysis (Primary)  McArdle's disease    (CMS-HCC)    Initial Impression, ED Course, Assessment and Plan   Time seen: December 12, 2023 12:51 AM  Vernard Gram is a 28 y.o. male presenting with muscle aches. This is a patient with known McArdle's disease presenting with exertional rhabdomyolysis. Differential includes rhabdo from metabolic myopathy (most likely), infection, dehydration, or medication effect. Labs significant for CK 93,919, AST 704, ALT 272, normal bilirubin, and preserved renal function. Elevated transaminases are attributed to muscle breakdown rather than hepatic injury. No clinical evidence of infection or hypovolemia. Given degree of CK elevation and risk for renal injury, plan for aggressive IV fluid resuscitation and inpatient monitoring.  Patient received initial IV fluids in the ED and will continue on continuous IV hydration with serial CK, renal panel, and LFT trending. Pain controlled with continuation of home oxycodone  regimen. No indication for further imaging or additional diagnostic testing at this time, as etiology is consistent with underlying McArdle's disease. Hospitalist team consulted and accepted patient for admission for monitoring and ongoing management.  Medical Decision Making Amount and/or Complexity of Data Reviewed Labs: ordered.  Risk Prescription drug management. Decision regarding hospitalization.    Social Drivers of Health with Concerns   Tobacco Use: Medium Risk (10/29/2023)   Patient History   . Smoking Tobacco Use: Former   . Smokeless Tobacco Use: Never   . Passive Exposure: Not on file  Substance Use: Not on file (01/13/2023)  Social Connections: Unknown (10/30/2019)   Social Connection and Isolation Panel   . Frequency of Communication with Friends and Family: More than three times a week   . Frequency of  Social Gatherings with Friends and Family: More than three times a week   . Attends Religious Services: Never   . Active Member of Clubs or Organizations: No   . Attends Banker Meetings: Never   . Marital Status: Not on file  Internet Connectivity: Internet connectivity concern identified (01/09/2022)   Internet Connectivity   . Do you have access to internet services: No   . How do you connect to the internet: Not on file   . Is your internet connection strong enough for you to watch video on your device without major problems?: Not on file   . Do you have enough data to get through the month?: Not on file   . Does at least one of the devices have a camera that you can use for video chat?: Not on file    ____________________________________________     History   Chief Complaint Medical Problem   HPI  Alwaleed Obeso is a 28 y.o. male with known McArdle's disease presents with diffuse myalgias and weakness after carrying a table earlier today. He reports that his legs gave out during exertion and that this is similar to prior rhabdomyolysis episodes, though he has not required hospitalization in over six months. He denies recent trauma beyond exertion, nausea, vomiting, diarrhea, or decreased oral intake. He initially experienced chills after a prolonged nap but denies current fever or infectious symptoms. No chest pain, shortness of breath, or urinary changes.  Review of Systems:  Pertinent positives and negatives are documented as per the HPI  Physical Exam   VITAL SIGNS:   ED Triage Vitals [12/11/23 2234]  Enc Vitals Group     BP 136/98     Pulse 94  SpO2 Pulse      Resp 16     Temp 36.6 C (97.9 F)     Temp Source Temporal     SpO2 100 %     Weight 68 kg (150 lb)     Height      Head Circumference      Peak Flow      Pain Score      Pain Loc      Pain Education      Exclude from Growth Chart     Constitutional: Alert and oriented.  Well appearing and in no distress. Eyes: Conjunctivae are normal. PERRL ENT      Head: Normocephalic and atraumatic.      Nose: No congestion.      Mouth/Throat: Mucous membranes are moist.      Neck: No stridor. Cardiovascular: Normal rate, regular rhythm. Respiratory: Normal respiratory effort. Gastrointestinal: Soft, non tender, non distend, without rebound or guarding. Musculoskeletal: No deformities. Diffuse muscle tenderness. Neurologic: Normal speech and language. No gross focal neurologic deficits are appreciated. Skin: Skin is warm, dry and intact. No rash noted.  Pertinent labs & imaging results that were available during my care of the patient were reviewed by me and considered in my medical decision making (see chart for details).    Dann Arley Leech, MD 12/12/23 [phone]

## 2023-12-12 NOTE — ED Notes (Signed)
 See ED provider note

## 2023-12-12 NOTE — Care Plan (Signed)
 [facility]  Care Management   Patient is a 28 y.o. admitted on 12/12/2023 for McArdle's disease    (CMS-HCC) [E74.04] Non-traumatic rhabdomyolysis [M62.82]. Per review of the medical record and discussion with the treating team, the patient does not meet indicators for a full assessment at this time. CM will continue to assess for discharge needs and follow up, as indicated.  Roberto Stanton December 12, 2023 1:59 PM   Food Insecurity: No Food Insecurity (09/23/2023)   Received from [facility]   Hunger Vital Sign   . Within the past 12 months, you worried that your food would run out before you got the money to buy more.: Never true   . Within the past 12 months, the food you bought just didn't last and you didn't have money to get more.: Never true    Financial Resource Strain: Low Risk  (05/04/2022)   Overall Financial Resource Strain (CARDIA)   . Difficulty of Paying Living Expenses: Not hard at all   Housing: Low Risk  (05/04/2022)   Transportation Needs: No Transportation Needs (09/23/2023)   Received from [facility] - Transportation   . In the past 12 months, has lack of transportation kept you from medical appointments or from getting medications?: No   . In the past 12 months, has lack of transportation kept you from meetings, work, or from getting things needed for daily living?: No

## 2023-12-12 NOTE — Progress Notes (Signed)
 ------------------------------------------------------------------------------- Attestation signed by Gareth, Leita Heckler, MD at 12/14/23 1629 Daily Progress Attestation: I saw and evaluated the patient, participating in the key portions of the service.  I reviewed the resident's note.  I agree with the resident's findings and plan.  I personally spent 52 minutes face-to-face and non-face-to-face in the care of this patient, which includes all pre, intra, and post visit time on the date of service. All documented time was specific to E/M and does not include any procedures that may have been performed.    [name], MD     -------------------------------------------------------------------------------  Family Medicine Inpatient Progress Note  Assessment & Plan:  Roberto Stanton is a 28 y.o. male whose presentation is complicated by  Myophosphorylase deficiency (glycogen-storage disease 5, McArdle disease) with recurrent nontraumatic rhabdomyolysis, and chronic pain related to McArdle disease that presented to [facility] Emergency Department with one day history of diffuse body aches and tea-colored urine.   Principal Problem:   Non-traumatic rhabdomyolysis Active Problems:   Transaminitis   Glycogen storage disease type 5    (CMS-HCC)   Chronic, continuous use of opioids   Elevated CK   Active Problems  # Rhabdomyolysis # McArdle disease Pain and urine discoloration are improving from admission.Profoundly elevated CK at 93,919 on admission is improving s/p 1L fluid bolus in ED and mIVF. Patient continues to endorse BLE pain though denies other acute symptoms, including cough, SOB, chest pain, rhinorrhea, abdominal pain, or flank pain. He remains afebrile and hemodynamically stable. Presentation with one day history of diffuse body aches and tea-colored urine after moving heavy furniture is a similar to his prior episodes of McArdle disease flares. Long term prevention  of episodes is difficult but severity of flares may be reduced with exercise modification and pre-exercise sucrose ingestion.  - Continue mIVF 200 ml/hr - Monitor UOP - strict I/Os - Trend CK level - Daily CMP  Elevated Transaminases Chronic, elevated from baseline but consistent with prior rhabdomyolysis episodes based upon chart review. This lab finding is most likely the result of rhabdomyolysis given overall clinical picture and patient history. The labs are unlikely related to liver injury given normal GGT range. Ratio of AST being greater than ALT makes alcohol use a possibility, however he denies current alcohol use and this is unlikely to cause such markedly elevated values. Also consider viral hepatitis, drug induced liver injury, and other infectious etiologies. Screened negative for HIV and Hepatitis A, B, and C in 2024. - Daily CMP  Chronic Problems  # Chronic pain syndrome: PDMP reviewed, fills consistent prescriptions for oxycodone  IR 10 mg from same provider.   - Increase oxycodone  15 mg - 20 mg q4h prn for pain - Continue home gabapentin  300 mg daily  Daily Checklist: Diet: Regular Diet DVT PPx: Not Indicated - Padua Score <4 Code Status: Full Code Dispo: Patient appropriate for Observation based on expectation at time of admission that period of observation will be less than 30 hours  Team Contact Information:  Primary Team: Family [facility] Primary Resident: Vermell LITTIE Schuller, MD Resident's Pager: [facility] [phone])  Interval History:  No acute events overnight. Continues to have significant muscular aching pains, worse in shoulders and quads bilaterally. Feels home pain medication is not cutting it.   ROS: Denies headache, chest pain, shortness of breath, abdominal pain, nausea, vomiting.  Objective:  Temp:  [35.5 C (95.9 F)-36.6 C (97.9 F)] 35.5 C (95.9 F) Pulse:  [85-94] 85 Resp:  [16] 16  BP: (122-136)/(92-98) 122/92 SpO2:  [96 %-100 %] 96 %,   Intake/Output Summary (Last 24 hours) at 12/12/2023 0655 Last data filed at 12/12/2023 0500 Gross per 24 hour  Intake 1602.5 ml  Output 300 ml  Net 1302.5 ml    Gen: NAD, laying comfortably in bed, conversational HENT: atraumatic, normocephalic Heart: RRR, no murmurs  Lungs: CTAB, no crackles or wheezes Abdomen: soft, NTND Extremities: No edema, tenderness to palpation, greatest at shoulders and quads  Vermell Schuller, MD  [facility] Family Medicine PGY-1

## 2023-12-12 NOTE — H&P (Signed)
 ------------------------------------------------------------------------------- Attestation signed by [facility], Roberto Fellows, MD at 12/12/23 0254 Admission Attestation: I saw and evaluated the patient, participating in the key portions of the service.  I reviewed the resident's note.  I agree with the resident's findings and plan.  I personally spent 40 minutes face-to-face and non-face-to-face in the care of this patient, which includes all pre, intra, and post visit time on the date of service. All documented time was specific to E/M and does not include any procedures that may have been performed.    Roberto Roberto Coyer, MD      -------------------------------------------------------------------------------  Family Medicine Inpatient Service History & Physical  Assessment & Plan:  Roberto Stanton is a 28 y.o. male whose presentation is complicated by Myophosphorylase deficiency (glycogen-storage disease 5, McArdle disease) with recurrent nontraumatic rhabdomyolysis, chronic pain syndrome that presented to [facility] Emergency Department with one day history of diffuse body aches and tea-colored urine.   Principal Problem:   Non-traumatic rhabdomyolysis Active Problems:   Transaminitis   Glycogen storage disease type 5    (CMS-HCC)   Chronic, continuous use of opioids   Elevated CK   Active Problems  # Rhabdomyolysis # McArdle disease Patient presents with one day history of diffuse body aches and tea-colored urine after moving heavy furniture into his apartment with presentation similar to prior episodes of McArdle disease flares. Patient afebrile with stable vitals. Labs remarkable for AST 704, ALT 272, and CK 93,919. S/p 1L fluid bolus in the ED then started on mIVF. Patient continues to endorse BLE pain though denies other acute symptoms, including cough, SOB, chest pain, rhinorrhea, abdominal pain, or flank pain. - Continue mIVF 200 ml/hr - Monitor UOP -  strict I/Os - Trend CK level - Daily CMP  Chronic Problems  # Chronic pain syndrome: PDMP reviewed, fills consistent prescriptions for oxycodone  IR 10 mg from same provider.   - Continue home oxycodone  10 mg - 15 mg q4h prn for pain - Continue home gabapentin  300 mg daily  The patient's presentation is complicated by the following clinically significant conditions requiring additional evaluation and treatment: McArdle disease, recurrent rhabdomyolysis, with evidence of liver dysfunction.   Issues Impacting Complexity of Management: <redacted file path> -The patient is at high risk of complications from rhabdomyolysis.  Medical Decision Making: Reviewed records from the following unique sources patient, chart review.  Checklist: Diet: Regular Diet DVT PPx: Not Indicated - Padua Score <4 Code Status: Full Code Dispo: Patient appropriate for Observation based on expectation at time of admission that period of observation will be less than 30 hours  Team Contact Information:  Primary Team: Family Medicine Blue Primary Resident: Roberto SHAUNNA Burkes, MD Resident's Pager: [facility] [phone])  Chief Concern:  Non-traumatic rhabdomyolysis  Subjective:  Roberto Stanton is a 28 y.o. male with pertinent PMHx of McArdle's disease presenting with one day history of    History obtained by patient.   HPI: Roberto Stanton is a 28 y.o. male with known McArdle's disease presents with diffuse myalgias and weakness after carrying a table earlier today. He reports that his legs gave out during exertion and that this is similar to prior rhabdomyolysis episodes. He denies recent trauma beyond exertion, nausea, vomiting, diarrhea, or decreased oral intake. He initially experienced chills after a prolonged nap but denies current fever or infectious symptoms. No chest pain, shortness of breath, or urinary changes.   Pertinent Surgical Hx Past Surgical History[1]   Pertinent  Family Hx Family History[2]  Pertinent Social Hx  Social History   Social History Narrative  . Not on file     Allergies Opioids - morphine  analogues and Chlorhexidine  I reviewed the Medication List. The current list is Accurate Prior to Admission medications  Medication Dose, Route, Frequency  acetaminophen  (TYLENOL ) 325 MG tablet 650 mg, Oral, Every 6 hours  naloxone  (NARCAN ) 4 mg nasal spray 1 spray  oxyCODONE  (ROXICODONE ) 10 mg immediate release tablet 10 mg, Every 6 hours PRN    Designated Healthcare Decision Maker: Roberto Stanton currently has decisional capacity for healthcare decision-making and is able to designate a surrogate healthcare decision maker. [name] designated healthcare decision maker(s) is/are Roberto Stanton (the patient's parent) as denoted by stated patient preference.  Objective:  Physical Exam: Temp:  [35.5 C (95.9 F)-36.6 C (97.9 F)] 35.5 C (95.9 F) Pulse:  [85-94] 85 Resp:  [16] 16 BP: (122-136)/(92-98) 122/92 SpO2:  [96 %-100 %] 96 %  Gen: NAD, converses  Eyes: Sclera anicteric, EOMI grossly normal  HENT: Atraumatic, normocephalic Neck: Trachea midline Heart: RRR Lungs: CTAB, no crackles or wheezes Abdomen: Soft, NTND Extremities: No edema Neuro: Grossly symmetric, non-focal   Skin:  No rashes, lesions on clothed exam Psych: Alert, oriented   Roberto Burkes, MD PGY2       [1] Past Surgical History: Procedure Laterality Date  . PR DEEP MUSCLE BIOPSY Left 11/08/2020   Procedure: MUSCLE BIOPSY OF LEFT VASTUS LATERALIS;  Surgeon: Roberto Jenkins Power, MD;  Location: MAIN OR [facility];  Service: Plastics  [2] Family History Problem Relation Age of Onset  . Heart disease Father

## 2023-12-12 NOTE — Care Plan (Signed)
 Shift Summary Pain decreased slightly after multiple comfort interventions and PRN oxyCODONE  administration. IV site remained clean, dry, and intact with no further intervention needed after initial dressing change. Mobility and activity tolerance were maintained despite recent weakness, with independent walking and ADLs documented. Fall reduction and safety interventions were consistently implemented throughout the shift.   Absence of Hospital-Acquired Illness or Injury: Fall reduction program and bed safety interventions were maintained throughout the shift, and no new injuries were documented; IV site remained clean, dry, and intact with no intervention needed after initial dressing change.  Optimal Comfort and Wellbeing: Pain was constant and aching but decreased slightly from 8 to 7 after therapeutic presence, environmental modification, emotional support, rest, repositioning, and PRN oxyCODONE  administration.  Readiness for Transition of Care: Report was called and given to the next unit, and discharge planning documentation indicates independent ADLs and support systems in place, with no home care services prior to admission.  Improved Activity Tolerance: Despite recent changes in arm and leg weakness, mobility and walking remained independent and activity level was documented as frequent walking during the shift.  Absence of Infection Signs and Symptoms: Temperature remained within normal limits and blood cultures were not drawn; urinalysis showed some abnormal findings but leukocytes and nitrites were negative, and CBC values were within reference ranges.   Problem: Adult Inpatient Plan of Care Goal: Absence of Hospital-Acquired Illness or Injury Outcome: Shift Focus Intervention: Identify and Manage Fall Risk Recent Flowsheet Documentation Taken 12/12/2023 0200 by Orlando Wes CROME, RN Safety Interventions: . fall reduction program maintained . low bed Intervention: Prevent  Skin Injury Recent Flowsheet Documentation Taken 12/12/2023 0437 by Orlando Wes CROME, RN Positioning for Skin: Supine/Back Taken 12/12/2023 0200 by Orlando Wes CROME, RN Positioning for Skin: Supine/Back Intervention: Prevent and Manage VTE (Venous Thromboembolism) Risk Recent Flowsheet Documentation Taken 12/12/2023 0437 by Orlando Wes CROME, RN Anti-Embolism Device Status: (no md order) Other (Comment) Taken 12/12/2023 0200 by Orlando Wes CROME, RN Anti-Embolism Device Status: (no md order) Other (Comment) Goal: Optimal Comfort and Wellbeing Outcome: Shift Focus Goal: Readiness for Transition of Care Outcome: Shift Focus   Problem: Fatigue Goal: Improved Activity Tolerance Outcome: Shift Focus   Problem: Infection Goal: Absence of Infection Signs and Symptoms Outcome: Shift Focus

## 2023-12-13 NOTE — Progress Notes (Signed)
 ------------------------------------------------------------------------------- Attestation signed by Gareth, Leita Heckler, MD at 12/17/23 1334 Daily Progress Attestation: I saw and evaluated the patient, participating in the key portions of the service.  I reviewed the resident's note.  I agree with the resident's findings and plan.  I personally spent 52 minutes face-to-face and non-face-to-face in the care of this patient, which includes all pre, intra, and post visit time on the date of service. All documented time was specific to E/M and does not include any procedures that may have been performed.    [name], MD      -------------------------------------------------------------------------------  Family Medicine Inpatient Progress Note  Assessment & Plan:  Roberto Stanton is a 28 y.o. 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 He requires continued hospitalization for IV fluids and pain management.   Principal Problem:   Non-traumatic rhabdomyolysis Active Problems:   Transaminitis   Glycogen storage disease type 5    (CMS-HCC)   Chronic, continuous use of opioids   Elevated CK   Active Problems  # Rhabdomyolysis # McArdle disease Pain and urine discoloration are improving from admission. Profoundly elevated CK at 93,919 on admission is improving s/p 1L fluid bolus in ED and mIVF. Patient continues to endorse BLE pain though denies other acute symptoms, including cough, SOB, chest pain, rhinorrhea, abdominal pain, or flank pain. He remains afebrile and hemodynamically stable. History of diffuse body aches and tea-colored urine after moving heavy furniture  is a similar to his prior episodes of McArdle disease flares. - Continue mIVF LR 200 ml/hr - Monitor UOP - strict I/Os - Trend CK level - Daily CMP  Elevated Transaminases Chronic, elevated from baseline but consistent with prior rhabdomyolysis episodes based upon chart review. Improving with rest and maintenance IV fluids. This lab finding is most likely the result of rhabdomyolysis given overall clinical picture and patient history. The labs are unlikely related to liver injury given normal GGT range. Ratio of AST being greater than ALT makes alcohol use a possibility, however he denies current alcohol use and the markedly high values are improving quickly with fluid resuscitation. Unlikely to be a drug induced liver injury. Of note, has previously screened negative for HIV and Hepatitis A, B, and C in 2024. - Daily CMP  Chronic Problems #Chronic pain syndrome: PDMP reviewed, fills consistent prescriptions for oxycodone  IR 10 mg from same provider.  Requiring increased dose for pain management. - Continue oxycodone  15 mg - 20 mg q4h prn for pain - Refusing home gabapentin  300 mg daily  Daily Checklist: Diet: Regular Diet DVT PPx: Not Indicated - Padua Score <4 Code Status: Full Code Dispo: Patient appropriate for Observation based on expectation at time of admission that period of observation will be less than 30 hours  Team Contact Information:  Primary Team: Family Medicine Blue Primary Resident: Vermell LITTIE Schuller, MD Resident's Pager: [facility] [phone])  Interval History:  No acute events overnight. Very eager to go home. His cousin is a hospital doctor and has a games developer in [place] that he has to leave by 1 PM to make.   ROS: Denies headache, chest pain, shortness of breath, abdominal pain, nausea, vomiting.  Objective:  Temp:  [36.1 C (97 F)-36.6 C (97.8 F)] 36.1 C (97  F) Pulse:  [76-78] 78 Resp:  [18] 18 BP: (101-127)/(60-84) 127/84 SpO2:  [97 %] 97 %,   Intake/Output Summary (Last 24 hours) at 12/13/2023 0654 Last data filed at 12/13/2023 0600 Gross per 24 hour  Intake 7049.17 ml  Output 4125 ml  Net 2924.17 ml   Gen: NAD, standing in room, pacing around  HENT: atraumatic, normocephalic Heart: RRR, no murmurs  Lungs: CTAB, no crackles or wheezes Abdomen: soft, NTND Extremities: No edema, tenderness to palpation of quad muscles and Paraspinous muscles   Vermell Schuller, MD  [facility] Family Medicine PGY-1

## 2023-12-14 NOTE — Discharge Summary (Signed)
 ------------------------------------------------------------------------------- Attestation signed by Gareth, Leita Heckler, MD at 12/14/23 1637 I saw and evaluated the patient, participating in the key portions of the service on the day of discharge. Patient has been advised we recommend ongoing hospitalization for rhabdomyolysis with aggressive IV fluid given CK level is still >20,000 given risk for kidney injury. He declined and states he would like to leave today, needs to attend a MMA fight that his cousin is in and he bought tickets for. I advised him of the risks, including permanent kidney failure, permanent damage to other organs, electrolyte derangements, and possible death. He stated understanding of these risks and still wanted to leave AMA. Advised to hydrate aggressively with electrolyte containing fluids (not just free water) and come back if return of dark urine, decreased urine output, difficulty breathing, HA, swelling, other concerns. Patient signed paperwork, IV removed and he left AMA.  [name], MD  -------------------------------------------------------------------------------   Physician Discharge Summary [facility] 3 BT1 [facility] [address] [place] [address] Dept: [phone] Loc: [phone]   Identifying Information:  Roberto Stanton May 17, 1995 [id number]  Primary Care Physician: [facility], [place]  Code Status: Full Code  Admit Date: 12/12/2023  Discharge Date: 12/14/2023   Discharge To: Against Medical Advice  Discharge Service: [facility] - [facility]   Discharge Attending Physician: No att. providers found  Discharge Diagnoses: Principal Problem:   Non-traumatic rhabdomyolysis Active Problems:   Transaminitis   Glycogen storage disease type 5    (CMS-HCC)   Chronic, continuous use of opioids   Elevated CK   Outpatient Provider Follow Up Issues:  [ ]  CK levels [ ]  LFT levels  Hospital Course:  [name]  presented with profoundly elevated CK at 93,919, muscle aches, and tea-colored urine following physical exertion from moving heavy furniture. CK, muscle aches, and elevated transaminases were improving following mIVF. Chart review shows negative work up for infectious causes of elevated transaminases. Symptoms of muscle aches and lab findings of Rhabdomyolysis are consistent with McArdle's flare. [name] continued to endorse bilateral lower extremity and back pain throughout admission. He remained afebrile and hemodynamically stable and was able to maintain adequate oral intake of food and fluids. His CK and LFT were elevated at the time he left Against Medical Advice. He was also requiring increased pain medications from his baseline. He expressed desire to go watch a fight in [place] at 7 PM on 12/14/23. The care team discussed the risks of leaving against medical advice including injury to his organs and even death. He expressed understanding of these risks and decided to leave against medical advice. He was encouraged him to return to medical care if he feels it's needed.   Touchbase with Outpatient Provider: Warm Handoff: Completed on 12/14/23 by Vermell LITTIE Schuller, MD  (Intern) via [facility].  Procedures: None No admission procedures for hospital encounter. ______________________________________________________________________ Discharge Medications:   Your Medication List     CONTINUE taking these medications    acetaminophen  325 MG tablet Commonly known as: TYLENOL  Take 2 tablets (650 mg total) by mouth every six (6) hours.   gabapentin  300 MG capsule Commonly known as: NEURONTIN  Take 1 capsule (300 mg total) by mouth Three (3) times a day.   NARCAN  4 mg/actuation nasal spray Generic drug: naloxone  1 spray. One spray in either nostril once for known/suspected opioid overdose. May repeat every 2-3 minutes in alternating nostril til EMS arrives   oxyCODONE  10 mg immediate  release tablet Commonly known as: ROXICODONE   Take 1 tablet (10 mg total) by mouth every six (6) hours as needed for pain.        Allergies: Opioids - morphine  analogues and Chlorhexidine ______________________________________________________________________ Pending Test Results (if blank, then none):   Most Recent Labs: All lab results last 24 hours -  Recent Results (from the past 24 hours)  CK   Collection Time: 12/14/23  6:49 AM  Result Value Ref Range   Creatine Kinase, Total 22,088.0 (H) 46.0 - 171.0 U/L  Comprehensive Metabolic Panel   Collection Time: 12/14/23  6:49 AM  Result Value Ref Range   Sodium 143 135 - 145 mmol/L   Potassium 4.2 3.5 - 5.1 mmol/L   Chloride 99 98 - 107 mmol/L   CO2 33.4 (H) 20.0 - 31.0 mmol/L   Anion Gap 11 5 - 14 mmol/L   BUN 6 (L) 9 - 23 mg/dL   Creatinine 9.33 (L) 9.26 - 1.18 mg/dL   BUN/Creatinine Ratio 9    eGFR CKD-EPI (2021) Male >90 >=60 mL/min/1.35m2   Glucose 89 70 - 179 mg/dL   Calcium 9.5 8.7 - 89.5 mg/dL   Albumin 3.2 (L) 3.4 - 5.0 g/dL   Total Protein 6.7 5.7 - 8.2 g/dL   Total Bilirubin 0.6 0.3 - 1.2 mg/dL   AST 744 (H) <=65 U/L   ALT 219 (H) 10 - 49 U/L   Alkaline Phosphatase 74 46 - 116 U/L    Relevant Studies/Radiology (if blank, then none): No results found. ______________________________________________________________________ Discharge Instructions:      Other Instructions     Discharge instructions     Thank you for coming to [facility] to receive care!  You were admitted for Rhabdomyolysis from a McArdle's disease flare. Over the course of your admission you were treated with medications and IV fluids to help with your symptoms. Based on your labs and our evaluation, you were found to improve but were not determined to be safe for discharge.  Please continue to take your home medications as directed.  Please attend your follow-up visits with your outpatient providers.  Please seek medical care if you symptoms  become worse.         Follow Up instructions and Outpatient Referrals    Discharge instructions       Appointments which have been scheduled for you    Nov 02, 2024 11:00 AM (Arrive by 10:35 AM) RETURN NEUROMUSCULAR with Anahit Maxwell Grimmer, MD [facility] [address] [place] [facility]) [address] [place] [address] [phone]        ______________________________________________________________________ Discharge Day Services: BP 103/73   Pulse 67   Temp 36.6 C (97.8 F) (Temporal)   Resp 18   Ht 177.8 cm (5' 10)   Wt 67.9 kg (149 lb 9.6 oz)   SpO2 97%   BMI 21.47 kg/m  Patient seen on the day of discharge and was determined not to be appropriate for discharge.  GEN: well appearing, lying in bed, NAD  HEENT: NCAT, MMM. EOMI. Neck: Supple. CV: Regular rate and rhythm. No murmurs/rubs/gallops. Pulm: CTAB. No wheezing, crackles, or rhonchi. Abd: Flat.  Nontender. No guarding, rebound.  Normoactive bowel sounds.   Neuro: A&O x 3. No focal deficits.  Ext: No peripheral edema.  Palpable distal pulses. Tenderness to palpation of lower back, quads, and shoulders.   Condition at Discharge: poor  Length of Discharge: I spent less than 30 mins in the discharge of this  patient.   Jade Mulvey, MD  [facility] Medicine PGY-1

## 2023-12-29 ENCOUNTER — Encounter: Payer: Self-pay | Admitting: Emergency Medicine

## 2023-12-29 ENCOUNTER — Inpatient Hospital Stay
Admission: EM | Admit: 2023-12-29 | Discharge: 2023-12-31 | DRG: 642 | Disposition: A | Discharge status: Home / Self Care | Attending: Student | Admitting: Student

## 2023-12-29 ENCOUNTER — Other Ambulatory Visit: Payer: Self-pay

## 2023-12-29 DIAGNOSIS — Z87891 Personal history of nicotine dependence: Secondary | ICD-10-CM | POA: Diagnosis not present

## 2023-12-29 DIAGNOSIS — E7404 McArdle disease: Secondary | ICD-10-CM | POA: Diagnosis present

## 2023-12-29 DIAGNOSIS — Z87442 Personal history of urinary calculi: Secondary | ICD-10-CM

## 2023-12-29 DIAGNOSIS — R509 Fever, unspecified: Secondary | ICD-10-CM | POA: Diagnosis present

## 2023-12-29 DIAGNOSIS — Z8249 Family history of ischemic heart disease and other diseases of the circulatory system: Secondary | ICD-10-CM

## 2023-12-29 DIAGNOSIS — Z1152 Encounter for screening for COVID-19: Secondary | ICD-10-CM | POA: Diagnosis not present

## 2023-12-29 DIAGNOSIS — Z79899 Other long term (current) drug therapy: Secondary | ICD-10-CM | POA: Diagnosis not present

## 2023-12-29 DIAGNOSIS — E876 Hypokalemia: Secondary | ICD-10-CM | POA: Diagnosis present

## 2023-12-29 DIAGNOSIS — Z886 Allergy status to analgesic agent status: Secondary | ICD-10-CM | POA: Diagnosis not present

## 2023-12-29 DIAGNOSIS — G894 Chronic pain syndrome: Secondary | ICD-10-CM | POA: Diagnosis present

## 2023-12-29 DIAGNOSIS — R52 Pain, unspecified: Secondary | ICD-10-CM

## 2023-12-29 DIAGNOSIS — M6282 Rhabdomyolysis: Principal | ICD-10-CM | POA: Diagnosis present

## 2023-12-29 DIAGNOSIS — R7401 Elevation of levels of liver transaminase levels: Secondary | ICD-10-CM

## 2023-12-29 DIAGNOSIS — Z885 Allergy status to narcotic agent status: Secondary | ICD-10-CM | POA: Diagnosis not present

## 2023-12-29 LAB — CK
Total CK: 7419 U/L — ABNORMAL HIGH (ref 49–397)
Total CK: >20000 U/L — ABNORMAL HIGH (ref 49–397)

## 2023-12-29 LAB — CBC WITH DIFFERENTIAL/PLATELET
Abs Immature Granulocytes: 0.02 K/uL (ref 0.00–0.07)
Basophils Absolute: 0.1 K/uL (ref 0.0–0.1)
Basophils Relative: 1 %
Eosinophils Absolute: 0.3 K/uL (ref 0.0–0.5)
Eosinophils Relative: 5 %
HCT: 41.7 % (ref 39.0–52.0)
Hemoglobin: 14.2 g/dL (ref 13.0–17.0)
Immature Granulocytes: 0 %
Lymphocytes Relative: 32 %
Lymphs Abs: 1.8 K/uL (ref 0.7–4.0)
MCH: 31.4 pg (ref 26.0–34.0)
MCHC: 34.1 g/dL (ref 30.0–36.0)
MCV: 92.3 fL (ref 80.0–100.0)
Monocytes Absolute: 0.8 K/uL (ref 0.1–1.0)
Monocytes Relative: 14 %
Neutro Abs: 2.7 K/uL (ref 1.7–7.7)
Neutrophils Relative %: 48 %
Platelets: 201 K/uL (ref 150–400)
RBC: 4.52 MIL/uL (ref 4.22–5.81)
RDW: 12.9 % (ref 11.5–15.5)
WBC: 5.6 K/uL (ref 4.0–10.5)
nRBC: 0 % (ref 0.0–0.2)

## 2023-12-29 LAB — COMPREHENSIVE METABOLIC PANEL WITH GFR
ALT: 111 U/L — ABNORMAL HIGH (ref 0–44)
AST: 105 U/L — ABNORMAL HIGH (ref 15–41)
Albumin: 3.9 g/dL (ref 3.5–5.0)
Alkaline Phosphatase: 65 U/L (ref 38–126)
Anion gap: 11 (ref 5–15)
BUN: 10 mg/dL (ref 6–20)
CO2: 27 mmol/L (ref 22–32)
Calcium: 8.8 mg/dL — ABNORMAL LOW (ref 8.9–10.3)
Chloride: 101 mmol/L (ref 98–111)
Creatinine, Ser: 0.63 mg/dL (ref 0.61–1.24)
GFR, Estimated: >60 mL/min (ref >60)
Glucose, Bld: 112 mg/dL — ABNORMAL HIGH (ref 70–99)
Potassium: 3.4 mmol/L — ABNORMAL LOW (ref 3.5–5.1)
Sodium: 139 mmol/L (ref 135–145)
Total Bilirubin: 0.8 mg/dL (ref 0.0–1.2)
Total Protein: 8 g/dL (ref 6.5–8.1)

## 2023-12-29 LAB — URINALYSIS, COMPLETE (UACMP) WITH MICROSCOPIC
Bilirubin Urine: NEGATIVE
Glucose, UA: NEGATIVE mg/dL
Ketones, ur: NEGATIVE mg/dL
Leukocytes,Ua: NEGATIVE
Nitrite: NEGATIVE
Protein, ur: NEGATIVE mg/dL
Specific Gravity, Urine: 1.008 (ref 1.005–1.030)
Squamous Epithelial / HPF: 0 /HPF (ref 0–5)
WBC, UA: 0 WBC/hpf (ref 0–5)
pH: 7 (ref 5.0–8.0)

## 2023-12-29 LAB — RESP PANEL BY RT-PCR (RSV, FLU A&B, COVID)  RVPGX2
Influenza A by PCR: NEGATIVE
Influenza B by PCR: NEGATIVE
Resp Syncytial Virus by PCR: NEGATIVE
SARS Coronavirus 2 by RT PCR: NEGATIVE

## 2023-12-29 LAB — GROUP A STREP BY PCR: Group A Strep by PCR: NOT DETECTED

## 2023-12-29 MED ORDER — POTASSIUM CHLORIDE CRYS ER 20 MEQ PO TBCR
40.0000 meq | EXTENDED_RELEASE_TABLET | Freq: Once | ORAL | Status: DC
  Filled 2023-12-29: qty 2

## 2023-12-29 MED ORDER — HYDROMORPHONE HCL 1 MG/ML IJ SOLN
0.5000 mg | Freq: Once | INTRAMUSCULAR | Status: AC | PRN
  Administered 2023-12-29: 0.5 mg via INTRAVENOUS
  Filled 2023-12-29 (×2): qty 0.5

## 2023-12-29 MED ORDER — OXYCODONE HCL 5 MG PO TABS
15.0000 mg | ORAL_TABLET | Freq: Once | ORAL | Status: AC
  Administered 2023-12-29: 15 mg via ORAL
  Filled 2023-12-29: qty 3

## 2023-12-29 MED ORDER — KETOROLAC TROMETHAMINE 15 MG/ML IJ SOLN
15.0000 mg | Freq: Once | INTRAMUSCULAR | Status: DC
  Filled 2023-12-29: qty 1

## 2023-12-29 MED ORDER — SODIUM CHLORIDE 0.9 % IV BOLUS
1000.0000 mL | Freq: Once | INTRAVENOUS | Status: AC
  Administered 2023-12-29: 1000 mL via INTRAVENOUS

## 2023-12-29 MED ORDER — GABAPENTIN 300 MG PO CAPS
300.0000 mg | ORAL_CAPSULE | Freq: Once | ORAL | Status: DC
  Filled 2023-12-29: qty 1

## 2023-12-29 NOTE — ED Provider Notes (Signed)
  Physical Exam  BP 124/84   Pulse 94   Temp 99 F (37.2 C) (Oral)   Resp 16   Ht 5' 10 (1.778 m)   Wt 68 kg   SpO2 98%   BMI 21.52 kg/m   Physical Exam  Procedures  Procedures  ED Course / MDM   Clinical Course as of 12/29/23 2310  Sun Dec 29, 2023  1712 CK Total(!): 7,419 [HD]  1714 CK Total(!): 7,419 Slightly elevated [HD]  1906 Group A Strep by PCR: NOT DETECTED Not elevated [HD]  1917 Creatinine: 0.63 No elevation in CK [HD]  2123 Patient signed out to oncoming provider at 9:20 PM pending urinalysis and repeat CK.  Decision points is if it is elevating to admit or if patient has continued intractable pain or home if patients CK improved and patient's pain improved [HD]  2211 UA with large hemoglobin and rare bacteria. No protein, nitrites or leukocytes or white blood cells. [SD]  2252 Repeat CK is >20,000. Will consult hospitalist for admission.  [SD]  2308 1L bolus added and another dose oxy 15 mg given. [SD]    Clinical Course User Index [HD] Nicholaus Rolland BRAVO, MD [SD] Sheron Salm, PA-C   Medical Decision Making Amount and/or Complexity of Data Reviewed Labs: ordered. Decision-making details documented in ED Course.  Risk Prescription drug management. Decision regarding hospitalization.   Please see original H&P and physical exam note under Dr. Nicholaus.  In short patient is here with 2 days of bodyaches sore throat, diaphoretic episodes and muscle crampings.  Has a history of Myophosphorylase deficiency. May give another dose of oxycodone  as needed for pain after 4 hours at minimum.  Repeat CK and UA pending.  Plan is to discharge if CK is downtrending and UA shows no signs of infection.  If CK is trending upward and patient has a lot of pain or signs of infection on UA, will admit.  Reevaluation of patient shows that his pain is now up to a 9 out of 10.  Repeat CK is over 20,000.  UA with moderate Hgb but no nitrites, leukocytes or WBCs. Will give another dose  of pain medication and consult hospitalist team for admission.  Dr. Posey Maier consulted. They are in agreement for admission and will take over his care from this point.     Sheron Salm, PA-C 12/29/23 2314    Nicholaus Rolland BRAVO, MD 12/30/23 [phone]

## 2023-12-29 NOTE — H&P (Incomplete)
  History and Physical    Patient: Roberto Stanton FMW:[id number] DOB: 04-Jan-1996 DOA: 12/29/2023 DOS: the patient was seen and examined on 12/29/2023 PCP: [facility]  Patient coming from: {Point_of_Origin:[address]}  Chief Complaint:  Chief Complaint  Patient presents with   Fever    Body Aches   HPI: Roberto Stanton is a 28 y.o. male with medical history significant of ***  Review of Systems: {ROS_Text:26778} Past Medical History:  Diagnosis Date   Kidney stones    McArdle disease (HCC) 09/28/2021   Pericarditis    Rhabdomyolysis    Past Surgical History:  Procedure Laterality Date   leg muscle biopsy     Social History:  reports that he has quit smoking. His smoking use included cigarettes. He has never used smokeless tobacco. He reports current drug use. Drug: Marijuana. He reports that he does not drink alcohol.  Allergies  Allergen Reactions   Acetaminophen  Nausea Only   Morphine  Hives    Family History  Problem Relation Age of Onset   Heart disease Father     Prior to Admission medications   Medication Sig Start Date End Date Taking? Authorizing Provider  gabapentin  (NEURONTIN ) 300 MG capsule Take 300 mg by mouth 3 (three) times daily. 06/01/23   [provider]  naloxone  (NARCAN ) nasal spray 4 mg/0.1 mL Place 1 spray into the nose daily as needed (for overdose).    [provider]  Oxycodone  HCl 10 MG TABS Take 10 mg by mouth 4 (four) times daily as needed (for pain). 07/15/23   [provider]    Physical Exam: Vitals:   12/29/23 1945 12/29/23 1950 12/29/23 2024 12/29/23 2107  BP: (!) 135/99 124/84    Pulse: (!) 111 94    Resp:  16  16  Temp:   99 F (37.2 C)   TempSrc:   Oral   SpO2:  98%    Weight:      Height:       *** Data Reviewed: {Tip this will not be part of the note when signed- Document your independent interpretation of telemetry tracing, EKG, lab, Radiology test or any other diagnostic tests. Add  any new diagnostic test ordered today. (Optional):26781} {Results:26384}  Assessment and Plan: No notes have been filed under this hospital service. Service: Hospitalist     Advance Care Planning:   Code Status: Prior ***  Consults: ***  Family Communication: ***  Severity of Illness: {Observation/Inpatient:21159}  Author: Posey Maier, DO 12/29/2023 11:14 PM  For on call review [email or web address].

## 2023-12-29 NOTE — ED Provider Notes (Signed)
 [facility] Provider Note    Event Date/Time   First MD Initiated Contact with Patient 12/29/23 1709     (approximate)   History   Fever (Body Aches)   HPI  Roberto Stanton is a 28 y.o. male  male withj  by Myophosphorylase deficiency (glycogen-storage disease 5, McArdle disease) with recurrent nontraumatic rhabdomyolysis, chronic pain syndrome that presents with 2 days body ache, sore throats episodes of diaphoresis and muscle cramping, similar symptoms that he obtains when he has flares and elevated CK's.  He denies any chest pain or shortness of breath.  He denies any fullness or firmness in any of his extremities.  He denies any abdominal pain or changes in urinary or bowel habits.      Physical Exam   Triage Vital Signs: ED Triage Vitals  Encounter Vitals Group     BP 12/29/23 1528 134/89     Girls Systolic BP Percentile --      Girls Diastolic BP Percentile --      Boys Systolic BP Percentile --      Boys Diastolic BP Percentile --      Pulse Rate 12/29/23 1528 (!) 115     Resp 12/29/23 1528 18     Temp 12/29/23 1528 98.4 F (36.9 C)     Temp src --      SpO2 12/29/23 1528 99 %     Weight 12/29/23 1527 150 lb (68 kg)     Height 12/29/23 1527 5' 10 (1.778 m)     Head Circumference --      Peak Flow --      Pain Score 12/29/23 1527 8     Pain Loc --      Pain Education --      Exclude from Growth Chart --     Most recent vital signs: Vitals:   12/29/23 2024 12/29/23 2107  BP:    Pulse:    Resp:  16  Temp: 99 F (37.2 C)   SpO2:      Nursing Triage Note reviewed. Vital signs reviewed and patients oxygen saturation is normoxic  General: Patient is well nourished, well developed, awake and alert, resting comfortably in no acute distress Head: Normocephalic and atraumatic Eyes: Normal inspection, extraocular muscles intact, no conjunctival pallor Ear, nose, throat: Normal external exam Tonsils are 2+ without exudates but  erythematous Neck: Normal range of motion Respiratory: Patient is in no respiratory distress, lungs CTAB Cardiovascular: Patient is not tachycardic, RRR without murmur appreciated GI: Abd SNT with no guarding or rebound  Back: Normal inspection of the back with good strength and range of motion throughout all ext Extremities: pulses intact with good cap refills, no LE pitting edema or calf tenderness All compartments are soft Neuro: The patient is alert and oriented to person, place, and time, appropriately conversive, with 5/5 bilat UE/LE strength, no gross motor or sensory defects noted. Coordination appears to be adequate. Skin: Warm, dry, and intact Psych: normal mood and affect, no SI or HI  ED Results / Procedures / Treatments   Labs (all labs ordered are listed, but only abnormal results are displayed) Labs Reviewed  COMPREHENSIVE METABOLIC PANEL WITH GFR - Abnormal; Notable for the following components:      Result Value   Potassium 3.4 (*)    Glucose, Bld 112 (*)    Calcium 8.8 (*)    AST 105 (*)    ALT 111 (*)    All other  components within normal limits  CK - Abnormal; Notable for the following components:   Total CK 7,419 (*)    All other components within normal limits  RESP PANEL BY RT-PCR (RSV, FLU A&B, COVID)  [id number]  GROUP A STREP BY PCR  CBC WITH DIFFERENTIAL/PLATELET  CK  URINALYSIS, COMPLETE (UACMP) WITH MICROSCOPIC     EKG None  RADIOLOGY None    PROCEDURES:  Critical Care performed: No  Procedures   MEDICATIONS ORDERED IN ED: Medications  potassium chloride  SA (KLOR-CON  M) CR tablet 40 mEq (40 mEq Oral Patient Refused/Not Given 12/29/23 1932)  gabapentin  (NEURONTIN ) capsule 300 mg (300 mg Oral Patient Refused/Not Given 12/29/23 1931)  ketorolac  (TORADOL ) 15 MG/ML injection 15 mg (15 mg Intravenous Patient Refused/Not Given 12/29/23 1932)  sodium chloride  0.9 % bolus 1,000 mL (0 mLs Intravenous Stopped 12/29/23 2102)  oxyCODONE  (Oxy  IR/ROXICODONE ) immediate release tablet 15 mg (15 mg Oral Given 12/29/23 1728)  sodium chloride  0.9 % bolus 1,000 mL (0 mLs Intravenous Stopped 12/29/23 2102)  HYDROmorphone  (DILAUDID ) injection 0.5 mg (0.5 mg Intravenous Given 12/29/23 2055)     IMPRESSION / MDM / ASSESSMENT AND PLAN / ED COURSE                                Differential diagnosis includes, but is not limited to, McArdle disease flare, electrolyte derangement, rhabdo, strep, anemia   ED course: Patient is well-appearing and abdominal exam is completely benign.  He has no evidence of compartment syndrome currently.  His CK today is mildly elevated at 7419 which is vastly decreased from his prior presentations where his CK was greater than 93,000.  Today his LFTs are only very mildly elevated with an AST of 105 and 111.  Patient is reassured by his blood work but he is concerned that he may flare as he has a history of his second CK being elevated.  I have given him 2 L of IV fluid.  Have written him for a dose of potassium chloride  and a dose of his pain medication.  We will recheck his CK after 2L IVF. A urine is pending as well.    Clinical Course as of 12/29/23 2124  Sun Dec 29, 2023  1712 CK Total(!): 7,419 [HD]  1714 CK Total(!): 7,419 Slightly elevated [HD]  1906 Group A Strep by PCR: NOT DETECTED Not elevated [HD]  1917 Creatinine: 0.63 No elevation in CK [HD]  2123 Patient signed out to oncoming provider at 9:20 PM pending urinalysis and repeat CK.  Decision points is if it is elevating to admit or if patient has continued intractable pain or home if patients CK improved and patient's pain improved [HD]    Clinical Course User Index [HD] Nicholaus Rolland BRAVO, MD   -- Risk: 5 This patient has a high risk of morbidity due to further diagnostic testing or treatment. Rationale: This patient's evaluation and management involve a high risk of morbidity due to the potential severity of presenting symptoms, need for  diagnostic testing, and/or initiation of treatment that may require close monitoring. The differential includes conditions with potential for significant deterioration or requiring escalation of care. Treatment decisions in the ED, including medication administration, procedural interventions, or disposition planning, reflect this level of risk. COPA: 5 The patient has the following acute or chronic illness/injury that poses a possible threat to life or bodily function: [X] : The patient has a potentially serious acute  condition or an acute exacerbation of a chronic illness requiring urgent evaluation and management in the Emergency Department. The clinical presentation necessitates immediate consideration of life-threatening or function-threatening diagnoses, even if they are ultimately ruled out.   FINAL CLINICAL IMPRESSION(S) / ED DIAGNOSES   Final diagnoses:  McArdle disease (HCC)  Non-traumatic rhabdomyolysis  Body aches     Rx / DC Orders   ED Discharge Orders     None        Note:  This document was prepared using Dragon voice recognition software and may include unintentional dictation errors.   Nicholaus Rolland BRAVO, MD 12/29/23 2125

## 2023-12-29 NOTE — ED Triage Notes (Signed)
 Patient arrives ambulatory by POV c/o fever, sweats and body aches over the past two days. Took ibuprofen  about an hour ago. Patient requesting to have his CK and liver enzymes checked due to his history.

## 2023-12-30 DIAGNOSIS — M6282 Rhabdomyolysis: Principal | ICD-10-CM

## 2023-12-30 LAB — URINE DRUG SCREEN, QUALITATIVE (ARMC ONLY)
Amphetamines, Ur Screen: NOT DETECTED
Barbiturates, Ur Screen: NOT DETECTED
Benzodiazepine, Ur Scrn: NOT DETECTED
Cannabinoid 50 Ng, Ur ~~LOC~~: NOT DETECTED
Cocaine Metabolite,Ur ~~LOC~~: NOT DETECTED
MDMA (Ecstasy)Ur Screen: NOT DETECTED
Methadone Scn, Ur: NOT DETECTED
Opiate, Ur Screen: NOT DETECTED
Phencyclidine (PCP) Ur S: NOT DETECTED
Tricyclic, Ur Screen: NOT DETECTED

## 2023-12-30 LAB — HEPATIC FUNCTION PANEL
ALT: 124 U/L — ABNORMAL HIGH (ref 0–44)
AST: 198 U/L — ABNORMAL HIGH (ref 15–41)
Albumin: 3.2 g/dL — ABNORMAL LOW (ref 3.5–5.0)
Alkaline Phosphatase: 62 U/L (ref 38–126)
Bilirubin, Direct: 0.1 mg/dL (ref 0.0–0.2)
Indirect Bilirubin: 0.4 mg/dL (ref 0.3–0.9)
Total Bilirubin: 0.5 mg/dL (ref 0.0–1.2)
Total Protein: 7.3 g/dL (ref 6.5–8.1)

## 2023-12-30 LAB — CK: Total CK: >20000 U/L — ABNORMAL HIGH (ref 49–397)

## 2023-12-30 LAB — BASIC METABOLIC PANEL WITH GFR
Anion gap: 17 — ABNORMAL HIGH (ref 5–15)
BUN: 9 mg/dL (ref 6–20)
CO2: 20 mmol/L — ABNORMAL LOW (ref 22–32)
Calcium: 8.8 mg/dL — ABNORMAL LOW (ref 8.9–10.3)
Chloride: 104 mmol/L (ref 98–111)
Creatinine, Ser: 0.81 mg/dL (ref 0.61–1.24)
GFR, Estimated: >60 mL/min (ref >60)
Glucose, Bld: 112 mg/dL — ABNORMAL HIGH (ref 70–99)
Potassium: 4 mmol/L (ref 3.5–5.1)
Sodium: 141 mmol/L (ref 135–145)

## 2023-12-30 LAB — PHOSPHORUS: Phosphorus: 3.9 mg/dL (ref 2.5–4.6)

## 2023-12-30 LAB — VITAMIN B12: Vitamin B-12: 425 pg/mL (ref 180–914)

## 2023-12-30 LAB — MAGNESIUM: Magnesium: 2 mg/dL (ref 1.7–2.4)

## 2023-12-30 LAB — VITAMIN D 25 HYDROXY (VIT D DEFICIENCY, FRACTURES): Vit D, 25-Hydroxy: 17.45 ng/mL — ABNORMAL LOW (ref 30–100)

## 2023-12-30 MED ORDER — CALCIUM CARBONATE 1250 (500 CA) MG PO TABS
1.0000 | ORAL_TABLET | Freq: Every day | ORAL | Status: DC
  Administered 2023-12-30 – 2023-12-31 (×2): 1250 mg via ORAL
  Filled 2023-12-30 (×2): qty 1

## 2023-12-30 MED ORDER — BISACODYL 10 MG RE SUPP: 10.0000 mg | Freq: Every day | RECTAL | Status: DC | PRN

## 2023-12-30 MED ORDER — BISACODYL 5 MG PO TBEC
10.0000 mg | DELAYED_RELEASE_TABLET | Freq: Every day | ORAL | Status: DC
  Administered 2023-12-30: 10 mg via ORAL
  Filled 2023-12-30: qty 2

## 2023-12-30 MED ORDER — LACTATED RINGERS IV SOLN: INTRAVENOUS | Status: DC

## 2023-12-30 MED ORDER — ORAL CARE MOUTH RINSE: 15.0000 mL | OROMUCOSAL | Status: DC | PRN

## 2023-12-30 MED ORDER — ENOXAPARIN SODIUM 40 MG/0.4ML IJ SOSY
40.0000 mg | PREFILLED_SYRINGE | INTRAMUSCULAR | Status: DC
  Administered 2023-12-31: 40 mg via SUBCUTANEOUS
  Filled 2023-12-30 (×2): qty 0.4

## 2023-12-30 MED ORDER — HYDROMORPHONE HCL 1 MG/ML IJ SOLN
0.5000 mg | INTRAMUSCULAR | Status: AC | PRN
  Administered 2023-12-30: 0.5 mg via INTRAVENOUS
  Filled 2023-12-30: qty 0.5

## 2023-12-30 MED ORDER — ENSURE PLUS HIGH PROTEIN PO LIQD
237.0000 mL | Freq: Three times a day (TID) | ORAL | Status: DC
  Administered 2023-12-30 (×2): 237 mL via ORAL

## 2023-12-30 MED ORDER — OXYCODONE HCL 5 MG PO TABS
10.0000 mg | ORAL_TABLET | ORAL | Status: DC | PRN
  Administered 2023-12-30 (×2): 10 mg via ORAL
  Filled 2023-12-30 (×2): qty 2

## 2023-12-30 MED ORDER — NALOXONE HCL 0.4 MG/ML IJ SOLN
0.4000 mg | INTRAMUSCULAR | Status: DC | PRN
Start: 2023-12-30 — End: 2023-12-31

## 2023-12-30 MED ORDER — POLYETHYLENE GLYCOL 3350 17 G PO PACK
17.0000 g | PACK | Freq: Every day | ORAL | Status: DC
  Administered 2023-12-30 – 2023-12-31 (×2): 17 g via ORAL
  Filled 2023-12-30 (×2): qty 1

## 2023-12-30 MED ORDER — OXYCODONE HCL 5 MG PO TABS
15.0000 mg | ORAL_TABLET | ORAL | Status: DC | PRN
  Administered 2023-12-30 – 2023-12-31 (×7): 15 mg via ORAL
  Filled 2023-12-30 (×7): qty 3

## 2023-12-30 NOTE — Plan of Care (Signed)

## 2023-12-30 NOTE — Progress Notes (Signed)
 [facility] Progress Note  Patient: Roberto Stanton    FMW:[id number]  DOA: 12/29/2023     Date of Service: the patient was seen and examined on 12/30/2023  Chief Complaint  Patient presents with   Fever    Body Aches   Brief hospital course: Roberto Stanton is a 28 y.o. male with medical history significant of Myophosphorylase deficiency (glycogen-storage disease 5, McArdle disease) with recurrent nontraumatic rhabdomyolysis  who presents to the emergency department due to 2-day onset of flulike symptoms, sore throat, chest congestion, body ache and muscle cramping similar to prior symptoms of McArdle's flareup.  Patient denies fever, headache, chest pain, shortness of breath, abdominal pain.   ED course In the emergency department, he was hemodynamically stable.  Workup in the ED showed normal CBC and BMP except for potassium of 3.4 and calcium 8.8.  AST 105, ALT 111, ALP 65, total CK 7,419 > >20,000, urinalysis was positive for hematuria but unimpressive for UTI.  Group A streptococcus by PCR was negative, respiratory panel was negative for influenza A, B, SARS, RSV, RSV. IV hydration was provided, and patient was treated with oxycodone . 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  Interventions:  Diet: Regular diet DVT Prophylaxis: Subcutaneous Lovenox    Advance goals of care discussion: Full code  Family Communication: family  was not present at bedside, at the time of interview.  The pt provided permission to discuss medical plan with the family. Opportunity was given to ask question and all questions were answered satisfactorily.   Disposition:  Pt is from home, admitted with nontraumatic rhabdomyolysis, still has elevated CK level on IV fluid, which precludes a safe discharge. Discharge to home, when stable, may need a few days to improve.  Subjective: No significant events overnight, patient still having generalized body ache 5/10, well-controlled with current pain management.  Physical Exam: General: NAD, lying comfortably Appear in no distress, affect appropriate Eyes: PERRLA ENT: Oral Mucosa Clear, moist  Neck: no JVD,  Cardiovascular: S1 and S2 Present, no Murmur,  Respiratory: good respiratory effort, Bilateral Air entry equal and Decreased, no Crackles, no wheezes Abdomen: Bowel Sound present, Soft and no tenderness,  Skin: no rashes Extremities: no Pedal edema, no calf tenderness Neurologic: without any new focal findings Gait not checked due to patient safety concerns  Vitals:   12/29/23 2320 12/30/23 0005 12/30/23 0433 12/30/23 0815  BP: (!) 136/90 127/82 117/76 124/84  Pulse: 100 91 98 86  Resp: 18 16 17 16   Temp: 99.1 F (37.3 C) 98.2 F (36.8 C) (!) 97.5 F (36.4 C) 98.3 F (36.8 C)  TempSrc: Oral Oral  Oral  SpO2: 98% 98% 100% 100%  Weight:      Height:        Intake/Output Summary (Last 24 hours) at 12/30/2023 1353 Last data filed at 12/30/2023 1211 Gross per 24 hour  Intake 3243.93 ml  Output 1800 ml  Net  1443.93 ml   Filed Weights   12/29/23 1527  Weight: 68 kg    Data Reviewed: I have personally reviewed and interpreted daily labs, tele strips, imagings as discussed above. I reviewed all nursing notes, pharmacy notes, vitals, pertinent old records I have discussed plan of care as described above with RN and patient/family.  CBC: Recent Labs  Lab 12/29/23 1531   WBC 5.6  NEUTROABS 2.7  HGB 14.2  HCT 41.7  MCV 92.3  PLT 201   Basic Metabolic Panel: Recent Labs  Lab 12/29/23 1531 12/30/23 0609  NA 139 141  K 3.4* 4.0  CL 101 104  CO2 27 20*  GLUCOSE 112* 112*  BUN 10 9  CREATININE 0.63 0.81  CALCIUM 8.8* 8.8*  MG  --  2.0  PHOS  --  3.9    Studies: No results found.  Scheduled Meds:  calcium carbonate  1 tablet Oral Q breakfast   enoxaparin  (LOVENOX ) injection  40 mg Subcutaneous Q24H   feeding supplement  237 mL Oral TID BM   gabapentin   300 mg Oral Once   ketorolac   15 mg Intravenous Once   potassium chloride   40 mEq Oral Once   Continuous Infusions:  lactated ringers  100 mL/hr at 12/30/23 1339   PRN Meds: naLOXone  (NARCAN )  injection, mouth rinse, oxyCODONE   Time spent: 35 minutes  Author: ELVAN SOR. MD [facility] 12/30/2023 1:53 PM  To reach On-call, see care teams to locate the attending and reach out to them via [email or web address]. If 7PM-7AM, please contact night-coverage If you still have difficulty reaching the attending provider, please page the [facility] (Director on Call) for [facility] on amion for assistance.

## 2023-12-30 NOTE — TOC CM/SW Note (Signed)
 Transition of Care [facility]) CM/SW Note    Transition of Care [facility]) - Inpatient Brief Assessment   Patient Details  Name: Roberto Stanton MRN: [id number] Date of Birth: Jun 21, 1995  Transition of Care [facility]) CM/SW Contact:    Alfonso Rummer, LCSW Phone Number: 12/30/2023, 1:19 PM   Clinical Narrative:  KEN DELENA Rummer completed TOC chart review. No Toc needs identified please contact should needs arise.   Transition of Care Asessment: Insurance and Status: Insurance coverage has been reviewed Patient has primary care physician: Yes ([facility]) Home environment has been reviewed: single family home   Prior/Current Home Services: No current home services Social Drivers of Health Review: SDOH reviewed no interventions necessary Readmission risk has been reviewed: No Transition of care needs: no transition of care needs at this time

## 2023-12-31 DIAGNOSIS — M6282 Rhabdomyolysis: Secondary | ICD-10-CM | POA: Diagnosis not present

## 2023-12-31 LAB — HEPATIC FUNCTION PANEL
ALT: 132 U/L — ABNORMAL HIGH (ref 0–44)
AST: 172 U/L — ABNORMAL HIGH (ref 15–41)
Albumin: 3.6 g/dL (ref 3.5–5.0)
Alkaline Phosphatase: 62 U/L (ref 38–126)
Bilirubin, Direct: 0.1 mg/dL (ref 0.0–0.2)
Indirect Bilirubin: 0.7 mg/dL (ref 0.3–0.9)
Total Bilirubin: 0.8 mg/dL (ref 0.0–1.2)
Total Protein: 7.7 g/dL (ref 6.5–8.1)

## 2023-12-31 LAB — BASIC METABOLIC PANEL WITH GFR
Anion gap: 11 (ref 5–15)
BUN: 10 mg/dL (ref 6–20)
CO2: 27 mmol/L (ref 22–32)
Calcium: 9 mg/dL (ref 8.9–10.3)
Chloride: 99 mmol/L (ref 98–111)
Creatinine, Ser: 0.55 mg/dL — ABNORMAL LOW (ref 0.61–1.24)
GFR, Estimated: >60 mL/min (ref >60)
Glucose, Bld: 94 mg/dL (ref 70–99)
Potassium: 3.6 mmol/L (ref 3.5–5.1)
Sodium: 137 mmol/L (ref 135–145)

## 2023-12-31 LAB — CBC
HCT: 42.7 % (ref 39.0–52.0)
Hemoglobin: 14.6 g/dL (ref 13.0–17.0)
MCH: 30.9 pg (ref 26.0–34.0)
MCHC: 34.2 g/dL (ref 30.0–36.0)
MCV: 90.5 fL (ref 80.0–100.0)
Platelets: 219 K/uL (ref 150–400)
RBC: 4.72 MIL/uL (ref 4.22–5.81)
RDW: 12.5 % (ref 11.5–15.5)
WBC: 5 K/uL (ref 4.0–10.5)
nRBC: 0 % (ref 0.0–0.2)

## 2023-12-31 LAB — MAGNESIUM: Magnesium: 1.9 mg/dL (ref 1.7–2.4)

## 2023-12-31 LAB — PHOSPHORUS: Phosphorus: 4.3 mg/dL (ref 2.5–4.6)

## 2023-12-31 LAB — CK: Total CK: >20000 U/L — ABNORMAL HIGH (ref 49–397)

## 2023-12-31 MED ORDER — CYANOCOBALAMIN 500 MCG PO TABS: 500.0000 ug | ORAL_TABLET | Freq: Every day | ORAL | 0 refills | Status: DC

## 2023-12-31 MED ORDER — VITAMIN D (ERGOCALCIFEROL) 1.25 MG (50000 UNIT) PO CAPS: 50000.0000 [IU] | ORAL_CAPSULE | ORAL | 0 refills | Status: DC

## 2023-12-31 MED ORDER — VITAMIN D (ERGOCALCIFEROL) 1.25 MG (50000 UNIT) PO CAPS
50000.0000 [IU] | ORAL_CAPSULE | ORAL | Status: DC
  Administered 2023-12-31: 50000 [IU] via ORAL
  Filled 2023-12-31: qty 1

## 2023-12-31 NOTE — Discharge Summary (Signed)
 [facility] Discharge Summary   Patient: Roberto Stanton FMW:[id number]  PCP: [facility]  Date of admission: 12/29/2023   Date of discharge:  12/31/2023     Discharge Diagnoses:  Principal Problem:   Rhabdomyolysis   Admitted From: Home Disposition:  Home   Recommendations for Outpatient Follow-up:  Follow-up with PCP in 2 to 3 days, repeat BMP and CK level in few days to make sure that CK level is trending down and no renal failure.  Patient was advised to continue hydration at home. Patient was advised to stay in the hospital for IV hydration because CK level is very high but he stated that he has a court date tomorrow which he does not want to miss and he is feeling fine, he will continue oral hydration at home and rest at home.  Patient understand the risk and benefit of leaving the hospital.  Patient stated that he can leave AMA if he will not be discharged so I decided to discharge him and explained the risk of leaving hospital as well and he verbalized understanding. Follow up LABS/TEST:  BMP and CK level 2-3 days   Follow-up Information     [facility] Follow up in 2 day(s).   Contact information: [address] [place] [address] [phone]                Diet recommendation: Regular diet  Activity: The patient is advised to gradually reintroduce usual activities, as tolerated  Discharge Condition: stable  Code Status: Full code   History of present illness: As per the H and P dictated on admission.  Hospital Course:  Roberto Stanton is a 28 y.o. male with medical history significant of Myophosphorylase deficiency (glycogen-storage disease 5, McArdle disease) with recurrent nontraumatic rhabdomyolysis  who presents to the emergency department due to 2-day onset of flulike symptoms, sore throat, chest congestion, body ache and muscle cramping similar to prior symptoms of McArdle's flareup.  Patient denies  fever, headache, chest pain, shortness of breath, abdominal pain.   ED course In the emergency department, he was hemodynamically stable.  Workup in the ED showed normal CBC and BMP except for potassium of 3.4 and calcium 8.8.  AST 105, ALT 111, ALP 65, total CK 7,419 > >20,000, urinalysis was positive for hematuria but unimpressive for UTI.  Group A streptococcus by PCR was negative, respiratory panel was negative for influenza A, B, SARS, RSV, RSV. IV hydration was provided, and patient was treated with oxycodone . 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  Patient stated that he has a court date tomorrow, he already missed 2 days in the past so he would like to go today.  Patient was advised to stay for IV hydration but he wanted to leave even AMA so I discharged him at his own risk.  # Chronic pain syndrome Continue oxycodone  10 mg every 4 hours as needed for moderate/severe pain   # Hypokalemia: Potassium repleted.   # Hypocalcemia Calcium 8.8, continue Os-Cal   # Transaminitis in the setting of rhabdomyolysis AST 105>198, ALT 111>124 He denies abdominal pain Continue to monitor liver enzymes  # Vitamin D deficiency: started vitamin D 50,000 units p.o.  weekly, follow with PCP to repeat vitamin D level after 3 to 6 months.    Body mass index is 21.52 kg/m.  Nutrition Interventions:  - Patient was instructed, not to drive, operate heavy machinery, perform activities at heights, swimming or participation in water activities or provide baby sitting services while on Pain, Sleep and Anxiety Medications; until his outpatient Physician has advised to do so again.  - Also recommended to not to take more than prescribed Pain,  Sleep and Anxiety Medications.  Patient was ambulatory without any assistance.  On the day of the discharge the patient's vitals were stable, and no other acute medical condition were reported by patient. the patient was felt safe to be discharge at Home.  Consultants: None Procedures: None  Discharge Exam: General: Appear in no distress, Oral Mucosa Clear, moist. Cardiovascular: S1 and S2 Present, no Murmur, Respiratory: normal respiratory effort, Bilateral Air entry present and no Crackles, no wheezes Abdomen: Bowel Sound present, Soft and no tenderness. Extremities: no Pedal edema, no calf tenderness Neurology: alert and oriented to time, place, and person affect appropriate.  Filed Weights   12/29/23 1527  Weight: 68 kg   Vitals:   12/31/23 0358 12/31/23 0754  BP: 130/75 108/77  Pulse: 78 79  Resp: 16 16  Temp: 98 F (36.7 C) 98.2 F (36.8 C)  SpO2: 98% 100%    DISCHARGE MEDICATION: Allergies as of 12/31/2023       Reactions   Acetaminophen  Nausea Only   Morphine  Hives        Medication List     TAKE these medications    cyanocobalamin 500 MCG tablet Commonly known as: VITAMIN B12 Take 1 tablet (500 mcg total) by mouth daily.   gabapentin  300 MG capsule Commonly known as: NEURONTIN  Take 300 mg by mouth 3 (three) times daily.   naloxone  4 MG/0.1ML Liqd nasal spray kit Commonly known as: NARCAN  Place 1 spray into the nose daily as needed (for overdose).   Oxycodone  HCl 10 MG Tabs Take 10 mg by mouth 4 (four) times daily as needed (for pain).   Vitamin D (Ergocalciferol) 1.25 MG (50000 UNIT) Caps capsule Commonly known as: DRISDOL Take 1 capsule (50,000 Units total) by mouth every 7 (seven) days. Start taking on: January 07, 2024       Allergies  Allergen Reactions   Acetaminophen  Nausea Only   Morphine  Hives   Discharge Instructions     Call MD for:  difficulty breathing, headache or visual disturbances   Complete by: As directed     Call MD for:  extreme fatigue   Complete by: As directed    Call MD for:  persistant dizziness or light-headedness   Complete by: As directed    Call MD for:  persistant nausea and vomiting   Complete by: As directed    Call MD for:  severe uncontrolled pain   Complete by: As directed    Call MD for:  temperature >100.4   Complete by: As directed    Diet - low sodium heart healthy   Complete by: As directed    Discharge instructions   Complete by: As directed    Follow-up with PCP in 2 to 3 days, repeat BMP and CK level in few days to make sure that CK level is trending down and no renal failure.  Patient was advised to continue hydration at home. Patient was advised to stay in the hospital for IV hydration because CK level is very high  but he stated that he has a court date tomorrow which he does not want to miss and he is feeling fine, he will continue oral hydration at home and rest at home.  Patient understand the risk and benefit of leaving the hospital.  Patient stated that he can leave AMA if he will not be discharged so I decided to discharge him and explained the risk of leaving hospital as well and he verbalized understanding.   Increase activity slowly   Complete by: As directed        The results of significant diagnostics from this hospitalization (including imaging, microbiology, ancillary and laboratory) are listed below for reference.    Significant Diagnostic Studies: No results found.  Microbiology: Recent Results (from the past 240 hours)  Resp panel by RT-PCR (RSV, Flu A&B, Covid) Anterior Nasal Swab     Status: None   Collection Time: 12/29/23  3:31 PM   Specimen: Anterior Nasal Swab  Result Value Ref Range Status   SARS Coronavirus 2 by RT PCR NEGATIVE NEGATIVE Final    Comment: (NOTE) SARS-CoV-2 target nucleic acids are NOT DETECTED.  The SARS-CoV-2 RNA is generally detectable in upper respiratory specimens during the acute phase of infection. The  lowest concentration of SARS-CoV-2 viral copies this assay can detect is 138 copies/mL. A negative result does not preclude SARS-Cov-2 infection and should not be used as the sole basis for treatment or other patient management decisions. A negative result may occur with  improper specimen collection/handling, submission of specimen other than nasopharyngeal swab, presence of viral mutation(s) within the areas targeted by this assay, and inadequate number of viral copies(<138 copies/mL). A negative result must be combined with clinical observations, patient history, and epidemiological information. The expected result is Negative.  Fact Sheet for Patients:  bloggercourse.com  Fact Sheet for Healthcare Providers:  seriousbroker.it  This test is no t yet approved or cleared by the [place]  FDA and  has been authorized for detection and/or diagnosis of SARS-CoV-2 by FDA under an Emergency Use Authorization (EUA). This EUA will remain  in effect (meaning this test can be used) for the duration of the COVID-19 declaration under Section 564(b)(1) of the Act, 21 U.S.C.section 360bbb-3(b)(1), unless the authorization is terminated  or revoked sooner.       Influenza A by PCR NEGATIVE NEGATIVE Final   Influenza B by PCR NEGATIVE NEGATIVE Final    Comment: (NOTE) The Xpert Xpress SARS-CoV-2/FLU/RSV plus assay is intended as an aid in the diagnosis of influenza from Nasopharyngeal swab specimens and should not be used as a sole basis for treatment. Nasal washings and aspirates are unacceptable for Xpert Xpress SARS-CoV-2/FLU/RSV testing.  Fact Sheet for Patients: bloggercourse.com  Fact Sheet for Healthcare Providers: seriousbroker.it  This test is not yet approved or cleared by the [place]  FDA and has been authorized for detection and/or diagnosis of SARS-CoV-2 by FDA under  an Emergency Use Authorization (EUA). This EUA will remain in effect (meaning this test can be used) for the duration of the COVID-19 declaration under Section 564(b)(1) of the Act, 21 U.S.C. section 360bbb-3(b)(1), unless the authorization is terminated or revoked.     Resp Syncytial Virus by PCR NEGATIVE NEGATIVE Final    Comment: (NOTE) Fact Sheet for Patients: bloggercourse.com  Fact Sheet for Healthcare Providers: seriousbroker.it  This test is not yet approved or cleared by the [place]  FDA and has been authorized for detection and/or diagnosis of SARS-CoV-2 by FDA under an Emergency Use  Authorization (EUA). This EUA will remain in effect (meaning this test can be used) for the duration of the COVID-19 declaration under Section 564(b)(1) of the Act, 21 U.S.C. section 360bbb-3(b)(1), unless the authorization is terminated or revoked.  Performed at [facility], [address] [place], [place] [address]   Group A Strep by PCR [facility] Only)     Status: None   Collection Time: 12/29/23  5:48 PM   Specimen: Throat; Sterile Swab  Result Value Ref Range Status   Group A Strep by PCR NOT DETECTED NOT DETECTED Final    Comment: Performed at [facility], [address] [place], [place] [address]     Labs: CBC: Recent Labs  Lab 12/29/23 1531 12/31/23 0442  WBC 5.6 5.0  NEUTROABS 2.7  --   HGB 14.2 14.6  HCT 41.7 42.7  MCV 92.3 90.5  PLT 201 219   Basic Metabolic Panel: Recent Labs  Lab 12/29/23 1531 12/30/23 0609 12/31/23 0442  NA 139 141 137  K 3.4* 4.0 3.6  CL 101 104 99  CO2 27 20* 27  GLUCOSE 112* 112* 94  BUN 10 9 10   CREATININE 0.63 0.81 0.55*  CALCIUM 8.8* 8.8* 9.0  MG  --  2.0 1.9  PHOS  --  3.9 4.3   Liver Function Tests: Recent Labs  Lab 12/29/23 1531 12/30/23 0609 12/31/23 0442  AST 105* 198* 172*  ALT 111* 124* 132*  ALKPHOS 65 62 62  BILITOT 0.8 0.5 0.8  PROT  8.0 7.3 7.7  ALBUMIN 3.9 3.2* 3.6   No results for input(s): LIPASE, AMYLASE in the last 168 hours. No results for input(s): AMMONIA in the last 168 hours. Cardiac Enzymes: Recent Labs  Lab 12/29/23 1531 12/29/23 2057 12/30/23 0609 12/31/23 0442  CKTOTAL 7,419* >20,000* >20,000* >20,000*   BNP (last 3 results) No results for input(s): BNP in the last 8760 hours. CBG: No results for input(s): GLUCAP in the last 168 hours.  Time spent: 35 minutes  Signed:  Elvan Sor  [facility] 12/31/2023 1:15 PM

## 2024-01-18 ENCOUNTER — Other Ambulatory Visit: Payer: Self-pay

## 2024-01-18 ENCOUNTER — Emergency Department
Admission: EM | Admit: 2024-01-18 | Discharge: 2024-01-18 | Disposition: A | Discharge status: Home / Self Care | Attending: Emergency Medicine | Admitting: Emergency Medicine

## 2024-01-18 DIAGNOSIS — J029 Acute pharyngitis, unspecified: Secondary | ICD-10-CM | POA: Insufficient documentation

## 2024-01-18 LAB — URINALYSIS, ROUTINE W REFLEX MICROSCOPIC
Bacteria, UA: NONE SEEN
Bilirubin Urine: NEGATIVE
Glucose, UA: NEGATIVE mg/dL
Ketones, ur: NEGATIVE mg/dL
Leukocytes,Ua: NEGATIVE
Nitrite: NEGATIVE
Protein, ur: NEGATIVE mg/dL
Specific Gravity, Urine: 1.017 (ref 1.005–1.030)
Squamous Epithelial / HPF: 0 /HPF (ref 0–5)
pH: 5 (ref 5.0–8.0)

## 2024-01-18 LAB — COMPREHENSIVE METABOLIC PANEL WITH GFR
ALT: 117 U/L — ABNORMAL HIGH (ref 0–44)
AST: 59 U/L — ABNORMAL HIGH (ref 15–41)
Albumin: 4.2 g/dL (ref 3.5–5.0)
Alkaline Phosphatase: 88 U/L (ref 38–126)
Anion gap: 10 (ref 5–15)
BUN: 16 mg/dL (ref 6–20)
CO2: 27 mmol/L (ref 22–32)
Calcium: 9.5 mg/dL (ref 8.9–10.3)
Chloride: 100 mmol/L (ref 98–111)
Creatinine, Ser: 0.67 mg/dL (ref 0.61–1.24)
GFR, Estimated: >60 mL/min (ref >60)
Glucose, Bld: 106 mg/dL — ABNORMAL HIGH (ref 70–99)
Potassium: 3.9 mmol/L (ref 3.5–5.1)
Sodium: 136 mmol/L (ref 135–145)
Total Bilirubin: 0.4 mg/dL (ref 0.0–1.2)
Total Protein: 7.7 g/dL (ref 6.5–8.1)

## 2024-01-18 LAB — CBC WITH DIFFERENTIAL/PLATELET
Abs Immature Granulocytes: 0.01 K/uL (ref 0.00–0.07)
Basophils Absolute: 0.1 K/uL (ref 0.0–0.1)
Basophils Relative: 1 %
Eosinophils Absolute: 0.4 K/uL (ref 0.0–0.5)
Eosinophils Relative: 6 %
HCT: 43.4 % (ref 39.0–52.0)
Hemoglobin: 15.1 g/dL (ref 13.0–17.0)
Immature Granulocytes: 0 %
Lymphocytes Relative: 32 %
Lymphs Abs: 1.8 K/uL (ref 0.7–4.0)
MCH: 31.6 pg (ref 26.0–34.0)
MCHC: 34.8 g/dL (ref 30.0–36.0)
MCV: 90.8 fL (ref 80.0–100.0)
Monocytes Absolute: 0.5 K/uL (ref 0.1–1.0)
Monocytes Relative: 10 %
Neutro Abs: 2.8 K/uL (ref 1.7–7.7)
Neutrophils Relative %: 51 %
Platelets: 233 K/uL (ref 150–400)
RBC: 4.78 MIL/uL (ref 4.22–5.81)
RDW: 12.5 % (ref 11.5–15.5)
WBC: 5.6 K/uL (ref 4.0–10.5)
nRBC: 0 % (ref 0.0–0.2)

## 2024-01-18 LAB — RESP PANEL BY RT-PCR (RSV, FLU A&B, COVID)  RVPGX2
Influenza A by PCR: NEGATIVE
Influenza B by PCR: NEGATIVE
Resp Syncytial Virus by PCR: NEGATIVE
SARS Coronavirus 2 by RT PCR: NEGATIVE

## 2024-01-18 LAB — CK
Total CK: 2121 U/L — ABNORMAL HIGH (ref 49–397)
Total CK: 1902 U/L — ABNORMAL HIGH (ref 49–397)

## 2024-01-18 LAB — GROUP A STREP BY PCR: Group A Strep by PCR: NOT DETECTED

## 2024-01-18 MED ORDER — SODIUM CHLORIDE 0.9 % IV BOLUS
1000.0000 mL | Freq: Once | INTRAVENOUS | Status: AC
  Administered 2024-01-18: 1000 mL via INTRAVENOUS

## 2024-01-18 MED ORDER — OXYCODONE HCL 5 MG PO TABS
10.0000 mg | ORAL_TABLET | Freq: Once | ORAL | Status: AC
  Administered 2024-01-18: 10 mg via ORAL
  Filled 2024-01-18: qty 2

## 2024-01-18 NOTE — Discharge Instructions (Signed)
 Your blood work is reassuring today.  Your CK level is downtrending as we discussed.  Your strep test is negative.  Your COVID/flu/RSV test is negative.  Please follow-up with your outpatient provider.  Please return for any new, worsening, or changing symptoms or other concerns.  It was a pleasure caring for you today.

## 2024-01-18 NOTE — ED Notes (Signed)
Pt provided crackers and Ginger-Ale.

## 2024-01-18 NOTE — ED Provider Notes (Signed)
 [facility] Provider Note    Event Date/Time   First MD Initiated Contact with Patient 01/18/24 1200     (approximate)   History   Sore Throat   HPI  Roberto Stanton is a 28 y.o. male with a past medical history of McArdle's disease with recurrent nontraumatic rhabdomyolysis, chronic pain syndrome, who presents today for evaluation of sore throat.  Patient reports that his symptoms began yesterday.  He does not typically get a sore throat with his rhabdomyolysis flares.  Reports that his urine is clear.  No chest pain or shortness of breath.  No difficulty swallowing, though reports pain with swallowing.  Patient Active Problem List   Diagnosis Date Noted   Leukocytosis 08/06/2023   Dental infection 06/22/2023   Epididymitis 01/24/2023   Rhabdomyolysis 01/24/2023   Opioid dependence (HCC) 01/24/2023   Testicular pain 01/23/2023   Left hydrocele 01/23/2023   Hypokalemia 04/29/2022   Hyponatremia 04/01/2022   Chronic pain 03/09/2022   Large tonsils 03/04/2022   Elevated CK 02/11/2022   Muscle cramps 01/27/2022   Non-traumatic rhabdomyolysis, recurrent 01/26/2022   Strep pharyngitis 10/04/2021   Myophosphorylase deficiency (glycogen storage disease V, McArdle disease) (HCC) 09/28/2021   Transaminitis 09/26/2021   Acute respiratory failure (HCC) 01/08/2020          Physical Exam   Triage Vital Signs: ED Triage Vitals  Encounter Vitals Group     BP 01/18/24 1149 (!) 125/90     Girls Systolic BP Percentile --      Girls Diastolic BP Percentile --      Boys Systolic BP Percentile --      Boys Diastolic BP Percentile --      Pulse Rate 01/18/24 1149 (!) 102     Resp 01/18/24 1149 20     Temp 01/18/24 1149 98.1 F (36.7 C)     Temp Source 01/18/24 1149 Oral     SpO2 01/18/24 1149 98 %     Weight 01/18/24 1149 149 lb 14.6 oz (68 kg)     Height 01/18/24 1149 5' 10 (1.778 m)     Head Circumference --      Peak Flow --      Pain Score  01/18/24 1147 10     Pain Loc --      Pain Education --      Exclude from Growth Chart --     Most recent vital signs: Vitals:   01/18/24 1242 01/18/24 1512  BP:    Pulse:    Resp:    Temp:    SpO2: 100% 100%    Physical Exam Vitals and nursing note reviewed.  Constitutional:      General: Awake and alert. No acute distress.    Appearance: Normal appearance. The patient is normal weight.  HENT:     Head: Normocephalic and atraumatic.     Mouth: Mucous membranes are moist. Uvula midline.  No tonsillar exudate.  Posterior oropharyngeal erythema present.  No soft palate fluctuance.  No trismus.  No voice change.  No sublingual swelling.  No tender cervical lymphadenopathy.  No nuchal rigidity Eyes:     General: PERRL. Normal EOMs        Right eye: No discharge.        Left eye: No discharge.     Conjunctiva/sclera: Conjunctivae normal.  Cardiovascular:     Rate and Rhythm: Normal rate and regular rhythm.     Pulses: Normal pulses.  Pulmonary:  Effort: Pulmonary effort is normal. No respiratory distress.     Breath sounds: Normal breath sounds.  Abdominal:     Abdomen is soft. There is no abdominal tenderness. No rebound or guarding. No distention. Musculoskeletal:        General: No swelling. Normal range of motion.     Cervical back: Normal range of motion and neck supple.  Skin:    General: Skin is warm and dry.     Capillary Refill: Capillary refill takes less than 2 seconds.     Findings: No rash.  Neurological:     Mental Status: The patient is awake and alert.      ED Results / Procedures / Treatments   Labs (all labs ordered are listed, but only abnormal results are displayed) Labs Reviewed  COMPREHENSIVE METABOLIC PANEL WITH GFR - Abnormal; Notable for the following components:      Result Value   Glucose, Bld 106 (*)    AST 59 (*)    ALT 117 (*)    All other components within normal limits  CK - Abnormal; Notable for the following components:    Total CK 2,121 (*)    All other components within normal limits  URINALYSIS, ROUTINE W REFLEX MICROSCOPIC - Abnormal; Notable for the following components:   Color, Urine STRAW (*)    APPearance CLEAR (*)    Hgb urine dipstick SMALL (*)    All other components within normal limits  CK - Abnormal; Notable for the following components:   Total CK 1,902 (*)    All other components within normal limits  GROUP A STREP BY PCR  RESP PANEL BY RT-PCR (RSV, FLU A&B, COVID)  [id number]  CBC WITH DIFFERENTIAL/PLATELET     EKG     RADIOLOGY     PROCEDURES:  Critical Care performed:   Procedures   MEDICATIONS ORDERED IN ED: Medications  sodium chloride  0.9 % bolus 1,000 mL (0 mLs Intravenous Stopped 01/18/24 1405)  sodium chloride  0.9 % bolus 1,000 mL (0 mLs Intravenous Stopped 01/18/24 1505)  oxyCODONE  (Oxy IR/ROXICODONE ) immediate release tablet 10 mg (10 mg Oral Given 01/18/24 1357)     IMPRESSION / MDM / ASSESSMENT AND PLAN / ED COURSE  I reviewed the triage vital signs and the nursing notes.   Differential diagnosis includes, but is not limited to, strep pharyngitis, COVID-19, influenza, rhabdomyolysis  I reviewed the patient's chart.  Patient has had 2 recent admissions within the last 2 months for rhabdomyolysis.  Patient was originally hospitalized at [facility] from 12/12/2023 until 12/14/2023 at which time he had a profoundly elevated CK at 93,000 following physical exertion.  He ultimately left AGAINST MEDICAL ADVICE.  He returned to the emergency department on 12/29/2023 with CK greater than 20,000.  He was treated aggressively with IV fluids and oxycodone .  Patient is awake and alert, hemodynamically stable and afebrile.  He is nontoxic in appearance.  His uvula is midline, no tonsillar exudate though he does have posterior oropharyngeal erythema.  No drooling or trismus, no voice change, no neck pain.  Not consistent with peritonsillar or retropharyngeal abscess.  Labs  obtained.  No leukocytosis.  He has an elevated CPK to 2000, significantly improved from prior.  He was hydrated with 2 L of normal saline and rechecked, and his CPK is downtrending.  AST and ALT minimally elevated, much improved from prior.  He was treated with his home dose of oxycodone .  Upon reevaluation, patient reports that he feels significantly  improved.  He is eating and drinking and watching TV and resting comfortably on the stretcher.  Symptoms are most consistent with viral URI at this time.  We discussed return precautions and outpatient management.  Patient understands and agrees with plan.  He was discharged in stable condition.  Patient's presentation is most consistent with acute complicated illness / injury requiring diagnostic workup.   Clinical Course as of 01/18/24 1632  Sat Jan 18, 2024  1353 Patient requesting his home dose of oxycodone  which is 10 mg every 4 hours [JP]  1552 Patient resting comfortably on stretcher watching TV and eating [JP]    Clinical Course User Index [JP] Ahnesti Townsend E, PA-C     FINAL CLINICAL IMPRESSION(S) / ED DIAGNOSES   Final diagnoses:  Sore throat     Rx / DC Orders   ED Discharge Orders     None        Note:  This document was prepared using Dragon voice recognition software and may include unintentional dictation errors.   Parmvir Boomer E, PA-C 01/18/24 1633    Dorothyann Drivers, MD 01/19/24 1436

## 2024-01-18 NOTE — ED Triage Notes (Signed)
 Pt to ED for severely swollen and painful tonsils since yesterday. Tonsils are swollen and red. No exudate seen. States goes into rhabdo frequently and would like CK checked. Denies fever, cough and SOB.  Respirations unlabored, voice clear. Is swallowing saliva. Requests liquid medication, no big pills.

## 2024-01-18 NOTE — ED Notes (Signed)
 Pt went to bathroom prior to order being placed for sample. Pt is aware that a sample has been ordered and will notify this RN when able to give a sample.

## 2024-01-31 ENCOUNTER — Observation Stay
Admission: EM | Admit: 2024-01-31 | Discharge: 2024-02-03 | DRG: 642 | Disposition: A | Source: Ambulatory Visit | Attending: Internal Medicine | Admitting: Internal Medicine

## 2024-01-31 ENCOUNTER — Encounter: Payer: Self-pay | Admitting: Internal Medicine

## 2024-01-31 ENCOUNTER — Other Ambulatory Visit: Payer: Self-pay

## 2024-01-31 ENCOUNTER — Emergency Department (HOSPITAL_COMMUNITY)
Admission: EM | Admit: 2024-01-31 | Discharge: 2024-01-31 | Discharge status: Against Medical Advice | Attending: Emergency Medicine | Admitting: Emergency Medicine

## 2024-01-31 ENCOUNTER — Encounter (HOSPITAL_COMMUNITY): Payer: Self-pay

## 2024-01-31 ENCOUNTER — Emergency Department (HOSPITAL_COMMUNITY)

## 2024-01-31 DIAGNOSIS — M6282 Rhabdomyolysis: Principal | ICD-10-CM | POA: Diagnosis present

## 2024-01-31 DIAGNOSIS — R7401 Elevation of levels of liver transaminase levels: Secondary | ICD-10-CM | POA: Diagnosis present

## 2024-01-31 DIAGNOSIS — Z5321 Procedure and treatment not carried out due to patient leaving prior to being seen by health care provider: Secondary | ICD-10-CM | POA: Insufficient documentation

## 2024-01-31 DIAGNOSIS — M549 Dorsalgia, unspecified: Secondary | ICD-10-CM | POA: Insufficient documentation

## 2024-01-31 DIAGNOSIS — E7404 McArdle disease: Secondary | ICD-10-CM | POA: Diagnosis present

## 2024-01-31 DIAGNOSIS — R748 Abnormal levels of other serum enzymes: Secondary | ICD-10-CM | POA: Insufficient documentation

## 2024-01-31 LAB — URINALYSIS, COMPLETE (UACMP) WITH MICROSCOPIC
Bacteria, UA: NONE SEEN
Bilirubin Urine: NEGATIVE
Glucose, UA: NEGATIVE mg/dL
Ketones, ur: NEGATIVE mg/dL
Leukocytes,Ua: NEGATIVE
Nitrite: NEGATIVE
Protein, ur: 100 mg/dL — AB
RBC / HPF: 0 RBC/hpf (ref 0–5)
Specific Gravity, Urine: 1.029 (ref 1.005–1.030)
Squamous Epithelial / HPF: 0 /HPF (ref 0–5)
pH: 6 (ref 5.0–8.0)

## 2024-01-31 LAB — URINE DRUG SCREEN
Amphetamines: NEGATIVE
Barbiturates: NEGATIVE
Benzodiazepines: NEGATIVE
Cocaine: NEGATIVE
Fentanyl: NEGATIVE
Methadone Scn, Ur: NEGATIVE
Opiates: NEGATIVE
Tetrahydrocannabinol: NEGATIVE

## 2024-01-31 LAB — COMPREHENSIVE METABOLIC PANEL WITH GFR
ALT: 157 U/L — ABNORMAL HIGH (ref 0–44)
AST: 274 U/L — ABNORMAL HIGH (ref 15–41)
Albumin: 4.2 g/dL (ref 3.5–5.0)
Alkaline Phosphatase: 85 U/L (ref 38–126)
Anion gap: 15 (ref 5–15)
BUN: 15 mg/dL (ref 6–20)
CO2: 24 mmol/L (ref 22–32)
Calcium: 8.6 mg/dL — ABNORMAL LOW (ref 8.9–10.3)
Chloride: 100 mmol/L (ref 98–111)
Creatinine, Ser: 0.69 mg/dL (ref 0.61–1.24)
GFR, Estimated: >60 mL/min (ref >60)
Glucose, Bld: 77 mg/dL (ref 70–99)
Potassium: 4 mmol/L (ref 3.5–5.1)
Sodium: 139 mmol/L (ref 135–145)
Total Bilirubin: 0.5 mg/dL (ref 0.0–1.2)
Total Protein: 7.8 g/dL (ref 6.5–8.1)

## 2024-01-31 LAB — PHOSPHORUS: Phosphorus: 3.4 mg/dL (ref 2.5–4.6)

## 2024-01-31 LAB — CBC WITH DIFFERENTIAL/PLATELET
Abs Immature Granulocytes: 0.01 K/uL (ref 0.00–0.07)
Basophils Absolute: 0.1 K/uL (ref 0.0–0.1)
Basophils Relative: 1 %
Eosinophils Absolute: 0.4 K/uL (ref 0.0–0.5)
Eosinophils Relative: 7 %
HCT: 45.9 % (ref 39.0–52.0)
Hemoglobin: 16.3 g/dL (ref 13.0–17.0)
Immature Granulocytes: 0 %
Lymphocytes Relative: 28 %
Lymphs Abs: 1.5 K/uL (ref 0.7–4.0)
MCH: 31.8 pg (ref 26.0–34.0)
MCHC: 35.5 g/dL (ref 30.0–36.0)
MCV: 89.5 fL (ref 80.0–100.0)
Monocytes Absolute: 0.4 K/uL (ref 0.1–1.0)
Monocytes Relative: 7 %
Neutro Abs: 2.9 K/uL (ref 1.7–7.7)
Neutrophils Relative %: 57 %
Platelets: 207 K/uL (ref 150–400)
RBC: 5.13 MIL/uL (ref 4.22–5.81)
RDW: 12.6 % (ref 11.5–15.5)
WBC: 5.2 K/uL (ref 4.0–10.5)
nRBC: 0 % (ref 0.0–0.2)

## 2024-01-31 LAB — MAGNESIUM: Magnesium: 2.3 mg/dL (ref 1.7–2.4)

## 2024-01-31 LAB — CK: Total CK: >20000 U/L — ABNORMAL HIGH (ref 49–397)

## 2024-01-31 LAB — TSH: TSH: 0.726 u[IU]/mL (ref 0.350–4.500)

## 2024-01-31 MED ORDER — ONDANSETRON HCL 4 MG PO TABS: 4.0000 mg | ORAL_TABLET | Freq: Four times a day (QID) | ORAL | Status: DC | PRN

## 2024-01-31 MED ORDER — HEPARIN SODIUM (PORCINE) 5000 UNIT/ML IJ SOLN
5000.0000 [IU] | Freq: Three times a day (TID) | INTRAMUSCULAR | Status: DC
  Filled 2024-01-31 (×2): qty 1

## 2024-01-31 MED ORDER — SODIUM CHLORIDE 0.9 % IV BOLUS
1000.0000 mL | Freq: Once | INTRAVENOUS | Status: AC
  Administered 2024-01-31: 1000 mL via INTRAVENOUS

## 2024-01-31 MED ORDER — HYDROMORPHONE HCL 1 MG/ML IJ SOLN
0.2000 mg | INTRAMUSCULAR | Status: DC | PRN
  Administered 2024-02-01 (×3): 0.2 mg via INTRAVENOUS
  Filled 2024-01-31 (×3): qty 0.5

## 2024-01-31 MED ORDER — OXYCODONE HCL 5 MG PO TABS
15.0000 mg | ORAL_TABLET | Freq: Once | ORAL | Status: AC
  Administered 2024-01-31: 15 mg via ORAL
  Filled 2024-01-31: qty 3

## 2024-01-31 MED ORDER — HYDROMORPHONE HCL 1 MG/ML IJ SOLN
0.2000 mg | INTRAMUSCULAR | Status: DC | PRN
  Administered 2024-01-31: 0.2 mg via INTRAVENOUS
  Filled 2024-01-31: qty 0.5

## 2024-01-31 MED ORDER — ONDANSETRON HCL 4 MG/2ML IJ SOLN: 4.0000 mg | Freq: Four times a day (QID) | INTRAMUSCULAR | Status: DC | PRN

## 2024-01-31 MED ORDER — OXYCODONE HCL 5 MG PO TABS
10.0000 mg | ORAL_TABLET | Freq: Once | ORAL | Status: AC
  Administered 2024-01-31: 10 mg via ORAL
  Filled 2024-01-31: qty 2

## 2024-01-31 MED ORDER — SODIUM CHLORIDE 0.9% FLUSH
3.0000 mL | Freq: Two times a day (BID) | INTRAVENOUS | Status: DC
  Administered 2024-01-31 – 2024-02-03 (×5): 3 mL via INTRAVENOUS

## 2024-01-31 MED ORDER — OXYCODONE HCL 5 MG PO TABS
10.0000 mg | ORAL_TABLET | Freq: Four times a day (QID) | ORAL | Status: DC | PRN
  Administered 2024-01-31 – 2024-02-01 (×3): 10 mg via ORAL
  Filled 2024-01-31 (×3): qty 2

## 2024-01-31 MED ORDER — SENNOSIDES-DOCUSATE SODIUM 8.6-50 MG PO TABS: 1.0000 | ORAL_TABLET | Freq: Every evening | ORAL | Status: DC | PRN

## 2024-01-31 MED ORDER — GABAPENTIN 300 MG PO CAPS
300.0000 mg | ORAL_CAPSULE | Freq: Three times a day (TID) | ORAL | Status: DC
  Administered 2024-02-01 – 2024-02-02 (×4): 300 mg via ORAL
  Filled 2024-01-31 (×7): qty 1

## 2024-01-31 MED ORDER — LACTATED RINGERS IV SOLN: INTRAVENOUS | Status: DC

## 2024-01-31 MED ORDER — METHOCARBAMOL 500 MG PO TABS
500.0000 mg | ORAL_TABLET | Freq: Four times a day (QID) | ORAL | Status: DC | PRN
  Administered 2024-01-31 – 2024-02-01 (×4): 500 mg via ORAL
  Filled 2024-01-31 (×4): qty 1

## 2024-01-31 NOTE — ED Notes (Signed)
 Pt ambulated to restroom without staff assistance. Pt had a even, steady gait. Pt reports dizziness and lightheadedness. Pt states urine is still dark in color. Pt back in bed with VS monitoring equipment. Bed in lowest, locked position.

## 2024-01-31 NOTE — ED Provider Notes (Signed)
 [facility] Provider Note    Event Date/Time   First MD Initiated Contact with Patient 01/31/24 1311     (approximate)   History   No chief complaint on file.  Presented to [facility] and [facility]. Seen by provider in Triage. Labs drawn at [facility].  Patient states he has a muscle condition and he believes his CK is elevated and BP was high.  AAOx3. Skin warm and dry. NAD   HPI Roberto Stanton is a 28 y.o. male with McArdle disease with recurrent nontraumatic rhabdomyolysis presents for evaluation of abnormal CK level - Patient had apparently gone to [facility] ED on 11/29 but ultimately left prior to evaluation.  Labs were drawn which did show an elevated CK at 1900. - 3 presents today because he was later told that was high and has been feeling fatigue and muscle aches primarily in his bilateral thighs which is usual for him when he gets flares of his muscle condition.  Notes his urine is very dark today. - Does have chronic pain and usually takes 10 mg oxycodone  - Amenable to admission  Patient did have an outpatient CBC this morning at 9 AM which showed a normal hemoglobin of 16.3, normal WBC of 5.2, normal platelets at 207.   Per chart review, patient has had multiple admissions for McArdle's disease and nontraumatic rhabdo myelitis, most recently 12/29/2023-12/31/2023.  Was getting IV hydration but left hospital AMA due to need to attend a court date.     Physical Exam   Triage Vital Signs: ED Triage Vitals  Encounter Vitals Group     BP 01/31/24 1233 (!) 155/95     Girls Systolic BP Percentile --      Girls Diastolic BP Percentile --      Boys Systolic BP Percentile --      Boys Diastolic BP Percentile --      Pulse Rate 01/31/24 1233 (!) 109     Resp 01/31/24 1233 16     Temp 01/31/24 1233 98.2 F (36.8 C)     Temp Source 01/31/24 1233 Oral     SpO2 01/31/24 1233 99 %     Weight 01/31/24 1232 150 lb (68 kg)     Height --      Head Circumference --       Peak Flow --      Pain Score 01/31/24 1232 8     Pain Loc --      Pain Education --      Exclude from Growth Chart --     Most recent vital signs: Vitals:   01/31/24 1233  BP: (!) 155/95  Pulse: (!) 109  Resp: 16  Temp: 98.2 F (36.8 C)  SpO2: 99%     General: Awake, no distress.  CV:  Good peripheral perfusion. RRR, RP 2+ Resp:  Normal effort. CTAB Abd:  No distention. Nontender to deep palpation throughout Other:  Some tenderness palpation bilateral thighs.  Compartments are soft.  No deformities.   ED Results / Procedures / Treatments   Labs (all labs ordered are listed, but only abnormal results are displayed) Labs Reviewed  CK - Abnormal; Notable for the following components:      Result Value   Total CK >20,000 (*)    All other components within normal limits  COMPREHENSIVE METABOLIC PANEL WITH GFR - Abnormal; Notable for the following components:   Calcium  8.6 (*)    AST 274 (*)  ALT 157 (*)    All other components within normal limits  URINALYSIS, COMPLETE (UACMP) WITH MICROSCOPIC     EKG  N/a   RADIOLOGY N/a    PROCEDURES:  Critical Care performed: No  Procedures   MEDICATIONS ORDERED IN ED: Medications  sodium chloride  0.9 % bolus 1,000 mL (has no administration in time range)  oxyCODONE  (Oxy IR/ROXICODONE ) immediate release tablet 15 mg (15 mg Oral Given 01/31/24 1429)     IMPRESSION / MDM / ASSESSMENT AND PLAN / ED COURSE  I reviewed the triage vital signs and the nursing notes.                              DDX/MDM/AP: Differential diagnosis includes, but is not limited to, recurrent nontraumatic rhabdomyolysis in the setting of McArdle disease.  Consider subsequent renal dysfunction or electrolyte derangements.  Sitter some element of dehydration.  No clear symptoms to suggest infection.  No evidence of compartment syndrome or traumatic injuries on my eval.  Plan: - Labs - IV fluid - Pain control   Patient's  presentation is most consistent with acute presentation with potential threat to life or bodily function.  The patient is on the cardiac monitor to evaluate for evidence of arrhythmia and/or significant heart rate changes.  ED course below.  Labs with CK greater than 20,000 consistent with rhabdo lysis.  Fortunately renal function normal.  AST and ALT mildly elevated compared to baseline elevation though very benign abdominal exam here, no clinical concern for acute intra-abdominal pathology.  Will admit to hospitalist service for nontraumatic rhabdomyolysis.  IV fluids started in emergency department.  Clinical Course as of 01/31/24 1543  Fri Jan 31, 2024  1427 Hospitalist consult order placed [MM]  1523 Spoke with patient placement, they state there was a delay in sending the consult and they sent in at 1515, will await call from hospitalist [MM]  1543 Urinalysis with no evidence of infection [MM]    Clinical Course User Index [MM] Clarine Ozell LABOR, MD     FINAL CLINICAL IMPRESSION(S) / ED DIAGNOSES   Final diagnoses:  Non-traumatic rhabdomyolysis  McArdle disease (HCC)     Rx / DC Orders   ED Discharge Orders     None        Note:  This document was prepared using Dragon voice recognition software and may include unintentional dictation errors.   Clarine Ozell LABOR, MD 01/31/24 1430

## 2024-01-31 NOTE — ED Notes (Signed)
 Pt vitals rechecked due to HR and BP

## 2024-01-31 NOTE — H&P (Signed)
 History and Physical    Roberto Stanton FMW:[id number] DOB: [birth year] DOA: 01/31/2024  DOS: the patient was seen and examined on 01/31/2024  PCP: Patient, No Pcp Per   Patient coming from: Home  I have personally briefly reviewed patient's old medical records in [facility] and CareEverywhere  HPI:   Roberto Stanton is a 28 y.o. 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   Pt with diffuse muscle pain. He denies over exertion. He denies any fevers, chills or URI symptoms. He initially went to [facility] ED earlier today but came here given long wait time. He states he might have had a seizure this am. He arrived at work and then his friend asking him if he was okay. He does not recall much of the episode but states his friend stated his body started jerking with head turning and his friend turned him to one side. He also reports his friend told him his eyes rolled backwards as well.   On arrival to the ED patient was noted to be HDS stable. Lab work obtained. CBC without leukocytosis. CMP  with mild AST and ALT elevation. Mild hypocalcemia at 8.6. CK level elevated at >20k. UA with hemoglobin but no RBC. Given pts presentation of rhabdomyolysis, [facility] contacted for admission.  Review of Systems: As mentioned in the history of present illness. All other systems reviewed and are negative.   Past Medical History:  Diagnosis Date   Kidney stones    McArdle disease (HCC) 09/28/2021   Pericarditis    Rhabdomyolysis     Past Surgical History:  Procedure Laterality Date   leg muscle biopsy       Allergies[1]  Family History  Problem Relation Age of Onset   Heart disease Father     Prior to Admission medications  Medication Sig Start Date End Date Taking? Authorizing Provider  gabapentin  (NEURONTIN ) 300 MG capsule Take 300 mg by mouth 3 (three) times daily.  06/01/23  Yes [provider]  Oxycodone  HCl 10 MG TABS Take 10 mg by mouth 4 (four) times daily as needed (for pain). 07/15/23  Yes [provider]  cyanocobalamin  (VITAMIN B12) 500 MCG tablet Take 1 tablet (500 mcg total) by mouth daily. Patient not taking: Reported on 01/31/2024 12/31/23 03/30/24  Von Bellis, MD  naloxone  (NARCAN ) nasal spray 4 mg/0.1 mL Place 1 spray into the nose daily as needed (for overdose). Patient not taking: Reported on 01/31/2024    [provider]  Vitamin D , Ergocalciferol , (DRISDOL ) 1.25 MG (50000 UNIT) CAPS capsule Take 1 capsule (50,000 Units total) by mouth every 7 (seven) days. Patient not taking: Reported on 01/31/2024 01/07/24 04/06/24  Von Bellis, MD    Social History:  reports that he has quit smoking. His smoking use included cigarettes. He has never used smokeless tobacco. He reports current drug use. Drug: Marijuana. He reports that he does not drink alcohol.    Physical Exam: Vitals:   01/31/24 1730 01/31/24 1800 01/31/24 1922 01/31/24 2000  BP: (!) 124/91 129/87 105/64 131/89  Pulse: 94 85 83 83  Resp: 12 19 17 16   Temp:   98.2 F (36.8 C) 98.6 F (37 C)  TempSrc:   Oral Oral  SpO2: 94% 97% 96% 100%  Weight:    74.9 kg  Height:    5' 9 (1.753 m)    Gen: NAD HENT: NCAT CV:  normal heart sounds Lung: CTAB Abd: No TTP, normal bowel sounds MSK: No asymmetry, good bulk and tone Neuro: alert and oriented   Labs on Admission: I have personally reviewed following labs and imaging studies  CBC: Recent Labs  Lab 01/31/24 0901  WBC 5.2  NEUTROABS 2.9  HGB 16.3  HCT 45.9  MCV 89.5  PLT 207   Basic Metabolic Panel: Recent Labs  Lab 01/31/24 1237  NA 139  K 4.0  CL 100  CO2 24  GLUCOSE 77  BUN 15  CREATININE 0.69  CALCIUM  8.6*  MG 2.3  PHOS 3.4   GFR: Estimated Creatinine Clearance: 136.2 mL/min (by C-G formula based on SCr of 0.69 mg/dL). Liver Function Tests: Recent Labs  Lab  01/31/24 1237  AST 274*  ALT 157*  ALKPHOS 85  BILITOT 0.5  PROT 7.8  ALBUMIN 4.2   No results for input(s): LIPASE, AMYLASE in the last 168 hours. No results for input(s): AMMONIA in the last 168 hours. Coagulation Profile: No results for input(s): INR, PROTIME in the last 168 hours. Cardiac Enzymes: Recent Labs  Lab 01/31/24 1237  CKTOTAL >20,000*   BNP (last 3 results) No results for input(s): BNP in the last 8760 hours. HbA1C: No results for input(s): HGBA1C in the last 72 hours. CBG: No results for input(s): GLUCAP in the last 168 hours. Lipid Profile: No results for input(s): CHOL, HDL, LDLCALC, TRIG, CHOLHDL, LDLDIRECT in the last 72 hours. Thyroid  Function Tests: No results for input(s): TSH, T4TOTAL, FREET4, T3FREE, THYROIDAB in the last 72 hours. Anemia Panel: No results for input(s): VITAMINB12, FOLATE, FERRITIN, TIBC, IRON, RETICCTPCT in the last 72 hours. Urine analysis:    Component Value Date/Time   COLORURINE AMBER (A) 01/31/2024 1435   APPEARANCEUR CLOUDY (A) 01/31/2024 1435   LABSPEC 1.029 01/31/2024 1435   PHURINE 6.0 01/31/2024 1435   GLUCOSEU NEGATIVE 01/31/2024 1435   HGBUR LARGE (A) 01/31/2024 1435   BILIRUBINUR NEGATIVE 01/31/2024 1435   KETONESUR NEGATIVE 01/31/2024 1435   PROTEINUR 100 (A) 01/31/2024 1435   NITRITE NEGATIVE 01/31/2024 1435   LEUKOCYTESUR NEGATIVE 01/31/2024 1435    Radiological Exams on Admission: I have personally reviewed images DG Chest 1 View Result Date: 01/31/2024 CLINICAL DATA:  Chest pain. EXAM: DG CHEST 1V COMPARISON:  12/12/2022. FINDINGS: The heart size and mediastinal contours are within normal limits. No focal consolidation, pleural effusion, or pneumothorax. No acute osseous abnormality. IMPRESSION: No acute cardiopulmonary findings. Electronically Signed   By: Harrietta Sherry M.D.   On: 01/31/2024 09:34    EKG: My personal interpretation of EKG shows: sinus  tachycardia without any acute ST changes. Qtc 440.    Assessment/Plan Principal Problem:   Non-traumatic rhabdomyolysis, recurrent Active Problems:   Transaminitis   Myophosphorylase deficiency (glycogen storage disease V, McArdle disease) (HCC)  Pt with hx of rhabdo mostly likely secondary to his underlying McArdle disease. Question if he had a seizure episode or the fall earlier led triggering rhabdo. Pt status post IVF. Will continue at 150 cc/hr. Will treat his pain with home regimen with breakthrough IV pain med and muscle relaxer. Pt has back pain but no step off on exam. Will get DG spine to look for occult fracture.   Transaminitis: Likely 2/2 to problem 1. Will trend.   Seizure? Will get EEG. Add TSH to labwork and consult neurology to see if further work up is needed. I asked pt if I could contact his friend to get collateral on the episode but pt  declined stating he wanted to ask his friend for permission prior to giving me his contact information.    VTE prophylaxis:  SQ Heparin   Diet:Reg Code Status:  Full Code Telemetry:  Admission status: Observation, Telemetry bed Patient is from: Home Anticipated d/c is to: Home Anticipated d/c is in: 1-2 days   Family Communication: Updated at bedside  Consults called: None   Severity of Illness: The appropriate patient status for this patient is OBSERVATION. Observation status is judged to be reasonable and necessary in order to provide the required intensity of service to ensure the patient's safety. The patient's presenting symptoms, physical exam findings, and initial radiographic and laboratory data in the context of their medical condition is felt to place them at decreased risk for further clinical deterioration. Furthermore, it is anticipated that the patient will be medically stable for discharge from the hospital within 2 midnights of admission.    Morene Bathe, MD [facility]      [1]   Allergies Allergen Reactions   Acetaminophen  Nausea Only   Morphine  Hives

## 2024-01-31 NOTE — ED Provider Triage Note (Signed)
 Emergency Medicine Provider Triage Evaluation Note  Roberto Stanton , a 28 y.o. male  was evaluated in triage.  Pt complains of muscle pains especially in the chest, has been weak today, history of genetic condition McArdle's disease which causes him to go into rhabdomyolysis.  Review of Systems  Positive: Muscle aches, generalized weakness Negative: Fever, vomiting, diarrhea  Physical Exam  BP (!) 148/92   Pulse (!) 116   Temp 98.3 F (36.8 C)   Resp 18   Ht 1.753 m (5' 9)   Wt 68 kg   SpO2 97%   BMI 22.15 kg/m  Gen:   Awake, no distress though mildly uncomfortable appearing Resp:  Normal effort clear lung sounds MSK:   Moves extremities without difficulty tenderness to palpation of muscles especially over the pectoralis Other:  Tachycardic to 110 to 120 bpm, sinus tachycardia  Medical Decision Making  Medically screening exam initiated at 9:00 AM.  Appropriate orders placed.  Roberto Stanton was informed that the remainder of the evaluation will be completed by another provider, this initial triage assessment does not replace that evaluation, and the importance of remaining in the ED until their evaluation is complete.  The patient has had recurrent rhabdomyolysis, will need evaluation, likely fluids if in fact he is, he is tachycardic   Cleotilde Rogue, MD 01/31/24 0900

## 2024-01-31 NOTE — ED Notes (Signed)
 Pt decided to leave AMA stated will go to the hospital in [place]

## 2024-01-31 NOTE — ED Triage Notes (Signed)
 Pt c.o back pain, thinks he is in rhabdo, was here a few weeks ago and had an elevated Ck level.

## 2024-01-31 NOTE — Plan of Care (Signed)
   Problem: Health Behavior/Discharge Planning: Goal: Ability to manage health-related needs will improve Outcome: Progressing

## 2024-01-31 NOTE — ED Notes (Addendum)
 Pt mentioned that he thinks he had a seizure this morning. He states that he was at work and a friend at work turned him on his side. Pt reports that he has felt out of it so he was not sure if he told anyone about this yet and I am not seeing it noted anywhere. He reports that he remembers seeing flashing lights and that is all he remembers. He said that he wanted us  to know because he was curious if a seizure could cause the Rhabdo.  Pt reports no hx of seizures.

## 2024-01-31 NOTE — ED Notes (Signed)
 Called CCMD to add pt to monitoring.

## 2024-01-31 NOTE — ED Triage Notes (Signed)
 Presented to [facility]. Seen by provider in Triage. Labs drawn at [facility].  Patient states he has a muscle condition and he believes his CK is elevated and BP was high.  AAOx3. Skin warm and dry. NAD

## 2024-02-01 ENCOUNTER — Inpatient Hospital Stay

## 2024-02-01 ENCOUNTER — Observation Stay

## 2024-02-01 DIAGNOSIS — R7401 Elevation of levels of liver transaminase levels: Secondary | ICD-10-CM | POA: Diagnosis present

## 2024-02-01 DIAGNOSIS — Z87891 Personal history of nicotine dependence: Secondary | ICD-10-CM | POA: Diagnosis not present

## 2024-02-01 DIAGNOSIS — Z8249 Family history of ischemic heart disease and other diseases of the circulatory system: Secondary | ICD-10-CM | POA: Diagnosis not present

## 2024-02-01 DIAGNOSIS — E7404 McArdle disease: Secondary | ICD-10-CM | POA: Diagnosis present

## 2024-02-01 DIAGNOSIS — Z79899 Other long term (current) drug therapy: Secondary | ICD-10-CM | POA: Diagnosis not present

## 2024-02-01 DIAGNOSIS — R569 Unspecified convulsions: Secondary | ICD-10-CM | POA: Diagnosis present

## 2024-02-01 DIAGNOSIS — M6282 Rhabdomyolysis: Secondary | ICD-10-CM | POA: Diagnosis present

## 2024-02-01 DIAGNOSIS — G894 Chronic pain syndrome: Secondary | ICD-10-CM | POA: Diagnosis present

## 2024-02-01 DIAGNOSIS — Z885 Allergy status to narcotic agent status: Secondary | ICD-10-CM | POA: Diagnosis not present

## 2024-02-01 LAB — CBC
HCT: 41.7 % (ref 39.0–52.0)
Hemoglobin: 14.2 g/dL (ref 13.0–17.0)
MCH: 31.4 pg (ref 26.0–34.0)
MCHC: 34.1 g/dL (ref 30.0–36.0)
MCV: 92.3 fL (ref 80.0–100.0)
Platelets: 184 K/uL (ref 150–400)
RBC: 4.52 MIL/uL (ref 4.22–5.81)
RDW: 12.7 % (ref 11.5–15.5)
WBC: 5.8 K/uL (ref 4.0–10.5)
nRBC: 0 % (ref 0.0–0.2)

## 2024-02-01 LAB — GLUCOSE, CAPILLARY: Glucose-Capillary: 97 mg/dL (ref 70–99)

## 2024-02-01 LAB — COMPREHENSIVE METABOLIC PANEL WITH GFR
ALT: 197 U/L — ABNORMAL HIGH (ref 0–44)
AST: 427 U/L — ABNORMAL HIGH (ref 15–41)
Albumin: 3.6 g/dL (ref 3.5–5.0)
Alkaline Phosphatase: 70 U/L (ref 38–126)
Anion gap: 9 (ref 5–15)
BUN: 10 mg/dL (ref 6–20)
CO2: 28 mmol/L (ref 22–32)
Calcium: 8.7 mg/dL — ABNORMAL LOW (ref 8.9–10.3)
Chloride: 101 mmol/L (ref 98–111)
Creatinine, Ser: 0.61 mg/dL (ref 0.61–1.24)
GFR, Estimated: >60 mL/min (ref >60)
Glucose, Bld: 81 mg/dL (ref 70–99)
Potassium: 4.1 mmol/L (ref 3.5–5.1)
Sodium: 138 mmol/L (ref 135–145)
Total Bilirubin: 0.5 mg/dL (ref 0.0–1.2)
Total Protein: 6.6 g/dL (ref 6.5–8.1)

## 2024-02-01 LAB — AMMONIA: Ammonia: 22 umol/L (ref 9–35)

## 2024-02-01 LAB — URINE DRUG SCREEN
Amphetamines: NEGATIVE
Barbiturates: NEGATIVE
Benzodiazepines: NEGATIVE
Cocaine: NEGATIVE
Fentanyl: NEGATIVE
Methadone Scn, Ur: NEGATIVE
Opiates: NEGATIVE
Tetrahydrocannabinol: NEGATIVE

## 2024-02-01 LAB — HIV ANTIBODY (ROUTINE TESTING W REFLEX): HIV Screen 4th Generation wRfx: NONREACTIVE

## 2024-02-01 LAB — CK
Total CK: >20000 U/L — ABNORMAL HIGH (ref 49–397)
Total CK: >20000 U/L — ABNORMAL HIGH (ref 49–397)

## 2024-02-01 MED ORDER — LIDOCAINE 5 % EX PTCH
1.0000 | MEDICATED_PATCH | CUTANEOUS | Status: DC
  Administered 2024-02-01: 1 via TRANSDERMAL

## 2024-02-01 MED ORDER — KETOROLAC TROMETHAMINE 15 MG/ML IJ SOLN: 15.0000 mg | Freq: Four times a day (QID) | INTRAMUSCULAR | Status: DC | PRN

## 2024-02-01 MED ORDER — GADOBUTROL 1 MMOL/ML IV SOLN
7.0000 mL | Freq: Once | INTRAVENOUS | Status: AC | PRN
  Administered 2024-02-01: 7 mL via INTRAVENOUS

## 2024-02-01 MED ORDER — OXYCODONE HCL 5 MG PO TABS
10.0000 mg | ORAL_TABLET | Freq: Four times a day (QID) | ORAL | Status: DC | PRN
  Administered 2024-02-01 – 2024-02-02 (×4): 10 mg via ORAL
  Filled 2024-02-01 (×4): qty 2

## 2024-02-01 MED ORDER — OXYCODONE HCL 5 MG PO TABS: 10.0000 mg | ORAL_TABLET | Freq: Four times a day (QID) | ORAL | Status: DC | PRN

## 2024-02-01 MED ORDER — LACTATED RINGERS IV SOLN: INTRAVENOUS | Status: AC

## 2024-02-01 NOTE — Plan of Care (Signed)

## 2024-02-01 NOTE — Progress Notes (Signed)
 Pt. returned from XR.

## 2024-02-01 NOTE — Progress Notes (Signed)
 Pt transported to XR.

## 2024-02-01 NOTE — Consult Note (Signed)
 Reason for Consult:Possible Seizure disorder Requesting Physician: Maree  CC: Seizure like activity  I have been asked by Dr. Maree to see this patient in consultation for seizure like activity.  HPI: Roberto Stanton is an 28 y.o. 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  Also reports that on day of arrival was at work and co-worker noted what is described as a tonic clonic seizure by patient.  Patient reports that he is amnestic of the event.  Patient reports it only lasted short time and was not brought to the hospital. No bowel or bladder incontinence, no tongue biting.  Past Medical History:  Diagnosis Date   Kidney stones    McArdle disease (HCC) 09/28/2021   Pericarditis    Rhabdomyolysis     Past Surgical History:  Procedure Laterality Date   leg muscle biopsy      Family History  Problem Relation Age of Onset   Heart disease Father     Social History:  reports that he has quit smoking. His smoking use included cigarettes. He has never used smokeless tobacco. He reports current drug use. Drug: Marijuana. He reports that he does not drink alcohol.  Allergies[1]  Medications: I have reviewed the patient's current medications. Prior to Admission:  Medications Prior to Admission  Medication Sig Dispense Refill Last Dose/Taking   gabapentin  (NEURONTIN ) 300 MG capsule Take 300 mg by mouth 3 (three) times daily.   01/31/2024 Morning   Oxycodone  HCl 10 MG TABS Take 10 mg by mouth 4 (four) times daily as needed (for pain).   01/31/2024 Morning   cyanocobalamin  (VITAMIN B12) 500 MCG tablet Take 1 tablet (500 mcg total) by mouth daily. (Patient not taking: Reported on 01/31/2024) 90 tablet 0 Not Taking   naloxone  (NARCAN ) nasal spray 4 mg/0.1 mL Place 1 spray into the nose daily as needed (for overdose). (Patient not taking: Reported on 01/31/2024)   Not Taking   Vitamin D , Ergocalciferol ,  (DRISDOL ) 1.25 MG (50000 UNIT) CAPS capsule Take 1 capsule (50,000 Units total) by mouth every 7 (seven) days. (Patient not taking: Reported on 01/31/2024) 12 capsule 0 Not Taking   Scheduled:  gabapentin   300 mg Oral TID   heparin   5,000 Units Subcutaneous Q8H   lidocaine   1 patch Transdermal Q24H   sodium chloride  flush  3 mL Intravenous Q12H    ROS: History obtained from the patient  General ROS: negative for - chills, fatigue, fever, night sweats, weight gain or weight loss Psychological ROS: negative for - behavioral disorder, hallucinations, memory difficulties, mood swings or suicidal ideation Ophthalmic ROS: negative for - blurry vision, double vision, eye pain or loss of vision ENT ROS: negative for - epistaxis, nasal discharge, oral lesions, sore throat, tinnitus or vertigo Allergy and Immunology ROS: negative for - hives or itchy/watery eyes Hematological and Lymphatic ROS: negative for - bleeding problems, bruising or swollen lymph nodes Endocrine ROS: negative for - galactorrhea, hair pattern changes, polydipsia/polyuria or temperature intolerance Respiratory ROS: negative for - cough, hemoptysis, shortness of breath or wheezing Cardiovascular ROS: chest pain Gastrointestinal ROS: negative for - abdominal pain, diarrhea, hematemesis, nausea/vomiting or stool incontinence Genito-Urinary ROS: negative for - dysuria, hematuria, incontinence or urinary frequency/urgency Musculoskeletal ROS: negative for - joint swelling or muscular weakness Neurological ROS: as noted in HPI Dermatological ROS: negative for rash and skin lesion changes   Physical Examination: Blood pressure 125/87, pulse 77, temperature 98.3 F (36.8  C), temperature source Oral, resp. rate 16, height 5' 9 (1.753 m), weight 72.7 kg, SpO2 100%.  HEENT-  Normocephalic, no lesions, without obvious abnormality.  Normal external eye and conjunctiva.  Normal external ears. Normal external nose, mucus membranes and  septum. Cardiovascular- Single S1, S2 Lungs- CTA Abdomen- soft, non-tender; bowel sounds normal; no masses,  no organomegaly Extremities- no edema Musculoskeletal-quad muscle tenderness Skin-warm and dry, no hyperpigmentation, vitiligo, or suspicious lesions  Neurological Examination   Mental Status: Alert, oriented, thought content appropriate.  Speech fluent without evidence of aphasia.  Able to follow 3 step commands without difficulty. Cranial Nerves: II: Visual fields grossly normal III,IV, VI: ptosis not present, extra-ocular motions intact bilaterally V,VII: smile symmetric, facial light touch sensation normal bilaterally VIII: hearing normal bilaterally XI: bilateral shoulder shrug XII: midline tongue extension Motor: Right : Upper extremity   5-/5    Left:     Upper extremity   5-/5  Lower extremity   5-/5     Lower extremity   5-/5 Tone and bulk:normal tone throughout; no atrophy noted Sensory: Pinprick and light touch intact throughout, bilaterally Deep Tendon Reflexes: 2+ and symmetric throughout Plantars: Right: downgoing   Left: downgoing Cerebellar: normal finger-to-nose and normal heel-to-shin testing bilaterally Gait: not tested due to safety concerns    Laboratory Studies:   Basic Metabolic Panel: Recent Labs  Lab 01/31/24 1237 02/01/24 0430  NA 139 138  K 4.0 4.1  CL 100 101  CO2 24 28  GLUCOSE 77 81  BUN 15 10  CREATININE 0.69 0.61  CALCIUM  8.6* 8.7*  MG 2.3  --   PHOS 3.4  --     Liver Function Tests: Recent Labs  Lab 01/31/24 1237 02/01/24 0430  AST 274* 427*  ALT 157* 197*  ALKPHOS 85 70  BILITOT 0.5 0.5  PROT 7.8 6.6  ALBUMIN 4.2 3.6   No results for input(s): LIPASE, AMYLASE in the last 168 hours. No results for input(s): AMMONIA in the last 168 hours.  CBC: Recent Labs  Lab 01/31/24 0901 02/01/24 0430  WBC 5.2 5.8  NEUTROABS 2.9  --   HGB 16.3 14.2  HCT 45.9 41.7  MCV 89.5 92.3  PLT 207 184    Cardiac  Enzymes: Recent Labs  Lab 01/31/24 1237 02/01/24 0430  CKTOTAL >20,000* >20,000*    BNP: Invalid input(s): POCBNP  CBG: Recent Labs  Lab 02/01/24 0816  GLUCAP 97    Microbiology: Results for orders placed or performed during the hospital encounter of 01/18/24  Group A Strep by PCR if patient complains of sore throat.     Status: None   Collection Time: 01/18/24 11:51 AM   Specimen: Throat; Sterile Swab  Result Value Ref Range Status   Group A Strep by PCR NOT DETECTED NOT DETECTED Final    Comment: Performed at [facility], [address] [place], [place] [address]  Resp panel by RT-PCR (RSV, Flu A&B, Covid) Anterior Nasal Swab     Status: None   Collection Time: 01/18/24 12:23 PM   Specimen: Anterior Nasal Swab  Result Value Ref Range Status   SARS Coronavirus 2 by RT PCR NEGATIVE NEGATIVE Final    Comment: (NOTE) SARS-CoV-2 target nucleic acids are NOT DETECTED.  The SARS-CoV-2 RNA is generally detectable in upper respiratory specimens during the acute phase of infection. The lowest concentration of SARS-CoV-2 viral copies this assay can detect is 138 copies/mL. A negative result does not preclude SARS-Cov-2 infection and should not  be used as the sole basis for treatment or other patient management decisions. A negative result may occur with  improper specimen collection/handling, submission of specimen other than nasopharyngeal swab, presence of viral mutation(s) within the areas targeted by this assay, and inadequate number of viral copies(<138 copies/mL). A negative result must be combined with clinical observations, patient history, and epidemiological information. The expected result is Negative.  Fact Sheet for Patients:  bloggercourse.com  Fact Sheet for Healthcare Providers:  seriousbroker.it  This test is no t yet approved or cleared by the [place]  FDA and  has been authorized for  detection and/or diagnosis of SARS-CoV-2 by FDA under an Emergency Use Authorization (EUA). This EUA will remain  in effect (meaning this test can be used) for the duration of the COVID-19 declaration under Section 564(b)(1) of the Act, 21 U.S.C.section 360bbb-3(b)(1), unless the authorization is terminated  or revoked sooner.       Influenza A by PCR NEGATIVE NEGATIVE Final   Influenza B by PCR NEGATIVE NEGATIVE Final    Comment: (NOTE) The Xpert Xpress SARS-CoV-2/FLU/RSV plus assay is intended as an aid in the diagnosis of influenza from Nasopharyngeal swab specimens and should not be used as a sole basis for treatment. Nasal washings and aspirates are unacceptable for Xpert Xpress SARS-CoV-2/FLU/RSV testing.  Fact Sheet for Patients: bloggercourse.com  Fact Sheet for Healthcare Providers: seriousbroker.it  This test is not yet approved or cleared by the [place]  FDA and has been authorized for detection and/or diagnosis of SARS-CoV-2 by FDA under an Emergency Use Authorization (EUA). This EUA will remain in effect (meaning this test can be used) for the duration of the COVID-19 declaration under Section 564(b)(1) of the Act, 21 U.S.C. section 360bbb-3(b)(1), unless the authorization is terminated or revoked.     Resp Syncytial Virus by PCR NEGATIVE NEGATIVE Final    Comment: (NOTE) Fact Sheet for Patients: bloggercourse.com  Fact Sheet for Healthcare Providers: seriousbroker.it  This test is not yet approved or cleared by the [place]  FDA and has been authorized for detection and/or diagnosis of SARS-CoV-2 by FDA under an Emergency Use Authorization (EUA). This EUA will remain in effect (meaning this test can be used) for the duration of the COVID-19 declaration under Section 564(b)(1) of the Act, 21 U.S.C. section 360bbb-3(b)(1), unless the authorization is  terminated or revoked.  Performed at [facility], [address] [place], [place] [address]     Coagulation Studies: No results for input(s): LABPROT, INR in the last 72 hours.  Urinalysis:  Recent Labs  Lab 01/31/24 1435  COLORURINE AMBER*  LABSPEC 1.029  PHURINE 6.0  GLUCOSEU NEGATIVE  HGBUR LARGE*  BILIRUBINUR NEGATIVE  KETONESUR NEGATIVE  PROTEINUR 100*  NITRITE NEGATIVE  LEUKOCYTESUR NEGATIVE    Lipid Panel:  No results found for: CHOL, TRIG, HDL, CHOLHDL, VLDL, LDLCALC  HgbA1C: No results found for: HGBA1C  Urine Drug Screen:      Component Value Date/Time   LABOPIA NEGATIVE 01/31/2024 1435   COCAINSCRNUR NEGATIVE 01/31/2024 1435   COCAINSCRNUR NONE DETECTED 12/30/2023 1213   LABBENZ NEGATIVE 01/31/2024 1435   AMPHETMU NEGATIVE 01/31/2024 1435   THCU NEGATIVE 01/31/2024 1435   LABBARB NEGATIVE 01/31/2024 1435    Alcohol Level: No results for input(s): ETH in the last 168 hours.  Other results: EKG: sinus tachycardia at 118 bpm.  Imaging: DG Chest 1 View Result Date: 01/31/2024 CLINICAL DATA:  Chest pain. EXAM: DG CHEST 1V COMPARISON:  12/12/2022. FINDINGS: The heart size and  mediastinal contours are within normal limits. No focal consolidation, pleural effusion, or pneumothorax. No acute osseous abnormality. IMPRESSION: No acute cardiopulmonary findings. Electronically Signed   By: Harrietta Sherry M.D.   On: 01/31/2024 09:34     Assessment/Plan: 28 y.o. 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  Also reports that on day of arrival was at work and co-worker noted what is described as a tonic clonic seizure by patient.  Patient reports that he is amnestic of the event.  Patient reports it only lasted short time and was not brought to the hospital. No bowel or bladder incontinence, no tongue biting. UDS negative.  CK>20,000.  LFTs elevated.   Neurological examination is nonfocal.  Recommendations: Antiepileptic not indicated at this time EEG.  May be performed as an outpatient if needed MRI of the brain with and without contrast Serum ammonia. Agree with treatment of rhabdo  Case discussed with Dr. Maree Sonny Hock, MD Neurology  02/01/2024, 10:56 AM          [1]  Allergies Allergen Reactions   Acetaminophen  Nausea Only   Morphine  Hives

## 2024-02-01 NOTE — Progress Notes (Signed)
 1      PROGRESS NOTE    Roberto Stanton  FMW:[id number] DOB: March 21, 1995 DOA: 01/31/2024 PCP: Patient, No Pcp Per   Brief Narrative:   28 y.o. 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   12/13: Neurology consult, MRI brain, changing pain regimen to oxycodone  10 mg and Toradol  every 6 as needed and lidocaine  patch   Assessment & Plan:   Principal Problem:   Non-traumatic rhabdomyolysis, recurrent Active Problems:   Transaminitis   Myophosphorylase deficiency (glycogen storage disease V, McArdle disease) (HCC)  Nontraumatic rhabdomyolysis Transaminitis   Myophosphorylase deficiency (glycogen storage disease V, McArdle disease) (HCC)  Pt with hx of rhabdo mostly likely secondary to his underlying McArdle disease. Question if he had a seizure episode or the fall earlier led triggering rhabdo.  - Continue IV fluids at 150 cc/hr.  -His CK is more than 20,000 with a baseline around 2000 per review of records. -He had a recent admission from 12/12/2023 when he left AMA on 12/14/2023 when his CK was over 93,000. -He also follows with [facility] neurology with last visit in September 2025  ?Seizure - Seen by neurology Antiepileptics not needed at this time. EEG could not be obtained with no technicians available over the weekend.  This can be done as an outpatient.  He does follow with neurology at [facility] MRI of the brain with and without contrast  Chronic pain syndrome: - He gets prescriptions for oxycodone  IR 10 mg from same provider - Changing pain regimen to oxycodone  10 mg and Toradol  15 mg Q6 as needed.  Lidocaine  patch twice daily for better pain control -Continue Robaxin  5 mg every 6 hours as needed along with gabapentin  UDS negative  DVT prophylaxis: (Lovenox /Heparin /SCD's/anticoagulated/None (if comfort care) heparin  injection 5,000 Units Start: 01/31/24 1600     Code  Status: (Full code Family Communication: None at bedside Disposition Plan: Possible discharge in next 2-3 days depending on clinical condition   Consultants:  Neurology   Subjective:  He is requesting to change his pain medication as he seem to be a lot of pain.  Objective: Vitals:   01/31/24 2000 02/01/24 0338 02/01/24 0500 02/01/24 0814  BP: 131/89 110/73  125/87  Pulse: 83 75  77  Resp: 16 15  16   Temp: 98.6 F (37 C) 97.8 F (36.6 C)  98.3 F (36.8 C)  TempSrc: Oral Oral  Oral  SpO2: 100% 100%  100%  Weight: 74.9 kg  72.7 kg   Height: 5' 9 (1.753 m)       Intake/Output Summary (Last 24 hours) at 02/01/2024 1349 Last data filed at 02/01/2024 1042 Gross per 24 hour  Intake 1860.82 ml  Output 950 ml  Net 910.82 ml   Filed Weights   01/31/24 1232 01/31/24 2000 02/01/24 0500  Weight: 68 kg 74.9 kg 72.7 kg    Examination:  General exam: Appears calm and comfortable  Respiratory system: Clear to auscultation. Respiratory effort normal. Cardiovascular system: S1 & S2 heard, RRR. No JVD, murmurs, rubs, gallops or clicks. No pedal edema. Gastrointestinal system: Abdomen is soft, benign Central nervous system: Alert and oriented. No focal neurological deficits. Extremities: Symmetric 5 x 5 power. Skin: No rashes, lesions or ulcers Psychiatry: Judgement and insight appear normal. Mood & affect appropriate.     Data Reviewed: I have personally reviewed following labs and imaging studies  CBC: Recent Labs  Lab 01/31/24  0901 02/01/24 0430  WBC 5.2 5.8  NEUTROABS 2.9  --   HGB 16.3 14.2  HCT 45.9 41.7  MCV 89.5 92.3  PLT 207 184   Basic Metabolic Panel: Recent Labs  Lab 01/31/24 1237 02/01/24 0430  NA 139 138  K 4.0 4.1  CL 100 101  CO2 24 28  GLUCOSE 77 81  BUN 15 10  CREATININE 0.69 0.61  CALCIUM  8.6* 8.7*  MG 2.3  --   PHOS 3.4  --    GFR: Estimated Creatinine Clearance: 136.2 mL/min (by C-G formula based on SCr of 0.61 mg/dL). Liver  Function Tests: Recent Labs  Lab 01/31/24 1237 02/01/24 0430  AST 274* 427*  ALT 157* 197*  ALKPHOS 85 70  BILITOT 0.5 0.5  PROT 7.8 6.6  ALBUMIN 4.2 3.6   No results for input(s): LIPASE, AMYLASE in the last 168 hours. Recent Labs  Lab 02/01/24 1238  AMMONIA 22   Coagulation Profile: No results for input(s): INR, PROTIME in the last 168 hours. Cardiac Enzymes: Recent Labs  Lab 01/31/24 1237 02/01/24 0430 02/01/24 1008  CKTOTAL >20,000* >20,000* >20,000*   BNP (last 3 results) No results for input(s): PROBNP in the last 8760 hours. HbA1C: No results for input(s): HGBA1C in the last 72 hours. CBG: Recent Labs  Lab 02/01/24 0816  GLUCAP 97   Lipid Profile: No results for input(s): CHOL, HDL, LDLCALC, TRIG, CHOLHDL, LDLDIRECT in the last 72 hours. Thyroid  Function Tests: Recent Labs    01/31/24 1435  TSH 0.726   Anemia Panel: No results for input(s): VITAMINB12, FOLATE, FERRITIN, TIBC, IRON, RETICCTPCT in the last 72 hours. Sepsis Labs: No results for input(s): PROCALCITON, LATICACIDVEN in the last 168 hours.  No results found for this or any previous visit (from the past 240 hours).       Radiology Studies: DG Thoracic Spine 2 View Result Date: 02/01/2024 CLINICAL DATA:  Generalized back pain.  Seizure. EXAM: THORACIC SPINE 2 VIEWS COMPARISON:  None Available. FINDINGS: There is no evidence of thoracic spine fracture. Alignment is normal. No other significant bone abnormalities are identified. IMPRESSION: Normal examination. Electronically Signed   By: Elspeth Bathe M.D.   On: 02/01/2024 11:34   DG Cervical Spine 2 or 3 views Result Date: 02/01/2024 CLINICAL DATA:  Generalized neck pain.  Seizure. EXAM: CERVICAL SPINE - 2-3 VIEW COMPARISON:  Thoracic spine radiographs obtained at the same time. FINDINGS: There is no evidence of cervical spine fracture or prevertebral soft tissue swelling. Alignment is normal. No  other significant bone abnormalities are identified. IMPRESSION: Normal examination. Electronically Signed   By: Elspeth Bathe M.D.   On: 02/01/2024 11:34   DG Lumbar Spine 2-3 Views Result Date: 02/01/2024 CLINICAL DATA:  Generalized back pain.  Seizure. EXAM: LUMBAR SPINE - 2-3 VIEW COMPARISON:  Thoracic spine radiographs obtained at the same time. FINDINGS: Five non-rib-bearing lumbar vertebrae. These have normal appearances with no fractures, pars defects or subluxations. IMPRESSION: Normal examination. Electronically Signed   By: Elspeth Bathe M.D.   On: 02/01/2024 11:33   DG Chest 1 View Result Date: 01/31/2024 CLINICAL DATA:  Chest pain. EXAM: DG CHEST 1V COMPARISON:  12/12/2022. FINDINGS: The heart size and mediastinal contours are within normal limits. No focal consolidation, pleural effusion, or pneumothorax. No acute osseous abnormality. IMPRESSION: No acute cardiopulmonary findings. Electronically Signed   By: Harrietta Sherry M.D.   On: 01/31/2024 09:34        Scheduled Meds:  gabapentin   300 mg Oral  TID   heparin   5,000 Units Subcutaneous Q8H   lidocaine   1 patch Transdermal Q24H   sodium chloride  flush  3 mL Intravenous Q12H   Continuous Infusions:  lactated ringers  150 mL/hr at 02/01/24 0948     LOS: 0 days    Time spent: 5 minutes    Cresencio Fairly, MD [facility] Pager [phone]  If 7PM-7AM, please contact night-coverage  02/01/2024, 1:49 PM

## 2024-02-02 LAB — BASIC METABOLIC PANEL WITH GFR
Anion gap: 9 (ref 5–15)
BUN: 11 mg/dL (ref 6–20)
CO2: 28 mmol/L (ref 22–32)
Calcium: 8.8 mg/dL — ABNORMAL LOW (ref 8.9–10.3)
Chloride: 102 mmol/L (ref 98–111)
Creatinine, Ser: 0.69 mg/dL (ref 0.61–1.24)
GFR, Estimated: >60 mL/min (ref >60)
Glucose, Bld: 88 mg/dL (ref 70–99)
Potassium: 3.9 mmol/L (ref 3.5–5.1)
Sodium: 139 mmol/L (ref 135–145)

## 2024-02-02 LAB — CBC
HCT: 40.9 % (ref 39.0–52.0)
Hemoglobin: 13.8 g/dL (ref 13.0–17.0)
MCH: 31 pg (ref 26.0–34.0)
MCHC: 33.7 g/dL (ref 30.0–36.0)
MCV: 91.9 fL (ref 80.0–100.0)
Platelets: 193 K/uL (ref 150–400)
RBC: 4.45 MIL/uL (ref 4.22–5.81)
RDW: 12.5 % (ref 11.5–15.5)
WBC: 6.2 K/uL (ref 4.0–10.5)
nRBC: 0 % (ref 0.0–0.2)

## 2024-02-02 LAB — GLUCOSE, CAPILLARY: Glucose-Capillary: 98 mg/dL (ref 70–99)

## 2024-02-02 LAB — CK: Total CK: >20000 U/L — ABNORMAL HIGH (ref 49–397)

## 2024-02-02 MED ORDER — OXYCODONE HCL 5 MG PO TABS
10.0000 mg | ORAL_TABLET | ORAL | Status: DC | PRN
  Administered 2024-02-02 – 2024-02-03 (×7): 10 mg via ORAL
  Filled 2024-02-02 (×7): qty 2

## 2024-02-02 NOTE — Progress Notes (Signed)
 Subjective: No seizure activity noted since admission  Objective: Current vital signs: BP 128/73 (BP Location: Right Arm)   Pulse 68   Temp 98 F (36.7 C)   Resp 16   Ht 5' 9 (1.753 m)   Wt 74.3 kg   SpO2 99%   BMI 24.19 kg/m  Vital signs in last 24 hours: Temp:  [97.8 F (36.6 C)-98.4 F (36.9 C)] 98 F (36.7 C) (12/14 0850) Pulse Rate:  [68-90] 68 (12/14 0850) Resp:  [14-16] 16 (12/14 0850) BP: (111-131)/(73-87) 128/73 (12/14 0850) SpO2:  [98 %-100 %] 99 % (12/14 0850) Weight:  [74.3 kg] 74.3 kg (12/14 0500)  Intake/Output from previous day: 12/13 0701 - 12/14 0700 In: 2305.2 [P.O.:460; I.V.:1845.2] Out: 700 [Urine:700] Intake/Output this shift: No intake/output data recorded. Nutritional status:  Diet Order             Diet regular Room service appropriate? Yes; Fluid consistency: Thin  Diet effective now                   Neurologic Exam: Mental Status: Alert, oriented, thought content appropriate.  Speech fluent without evidence of aphasia.  Able to follow 3 step commands without difficulty. Cranial Nerves: II: Visual fields grossly normal III,IV, VI: ptosis not present, extra-ocular motions intact bilaterally V,VII: smile symmetric, facial light touch sensation normal bilaterally VIII: hearing normal bilaterally XI: bilateral shoulder shrug XII: midline tongue extension Motor: Right :  Upper extremity   5-/5                                     Left:     Upper extremity   5-/5             Lower extremity   5-/5                                                 Lower extremity   5-/5 Tone and bulk:normal tone throughout; no atrophy noted Sensory: Pinprick and light touch intact throughout, bilaterally Deep Tendon Reflexes: 2+ and symmetric throughout   Lab Results: Basic Metabolic Panel: Recent Labs  Lab 01/31/24 1237 02/01/24 0430 02/02/24 0452  NA 139 138 139  K 4.0 4.1 3.9  CL 100 101 102  CO2 24 28 28   GLUCOSE 77 81 88  BUN 15 10 11    CREATININE 0.69 0.61 0.69  CALCIUM  8.6* 8.7* 8.8*  MG 2.3  --   --   PHOS 3.4  --   --     Liver Function Tests: Recent Labs  Lab 01/31/24 1237 02/01/24 0430  AST 274* 427*  ALT 157* 197*  ALKPHOS 85 70  BILITOT 0.5 0.5  PROT 7.8 6.6  ALBUMIN 4.2 3.6   No results for input(s): LIPASE, AMYLASE in the last 168 hours. Recent Labs  Lab 02/01/24 1238  AMMONIA 22    CBC: Recent Labs  Lab 01/31/24 0901 02/01/24 0430 02/02/24 0452  WBC 5.2 5.8 6.2  NEUTROABS 2.9  --   --   HGB 16.3 14.2 13.8  HCT 45.9 41.7 40.9  MCV 89.5 92.3 91.9  PLT 207 184 193    Cardiac Enzymes: Recent Labs  Lab 01/31/24 1237 02/01/24 0430 02/01/24 1008 02/02/24 0452  CKTOTAL >20,000* >20,000* >20,000* >20,000*  Lipid Panel: No results for input(s): CHOL, TRIG, HDL, CHOLHDL, VLDL, LDLCALC in the last 168 hours.  CBG: Recent Labs  Lab 02/01/24 0816 02/02/24 0852  GLUCAP 97 98    Microbiology: Results for orders placed or performed during the hospital encounter of 01/18/24  Group A Strep by PCR if patient complains of sore throat.     Status: None   Collection Time: 01/18/24 11:51 AM   Specimen: Throat; Sterile Swab  Result Value Ref Range Status   Group A Strep by PCR NOT DETECTED NOT DETECTED Final    Comment: Performed at [facility], [address] [place], [place] [address]  Resp panel by RT-PCR (RSV, Flu A&B, Covid) Anterior Nasal Swab     Status: None   Collection Time: 01/18/24 12:23 PM   Specimen: Anterior Nasal Swab  Result Value Ref Range Status   SARS Coronavirus 2 by RT PCR NEGATIVE NEGATIVE Final    Comment: (NOTE) SARS-CoV-2 target nucleic acids are NOT DETECTED.  The SARS-CoV-2 RNA is generally detectable in upper respiratory specimens during the acute phase of infection. The lowest concentration of SARS-CoV-2 viral copies this assay can detect is 138 copies/mL. A negative result does not preclude SARS-Cov-2 infection and should  not be used as the sole basis for treatment or other patient management decisions. A negative result may occur with  improper specimen collection/handling, submission of specimen other than nasopharyngeal swab, presence of viral mutation(s) within the areas targeted by this assay, and inadequate number of viral copies(<138 copies/mL). A negative result must be combined with clinical observations, patient history, and epidemiological information. The expected result is Negative.  Fact Sheet for Patients:  bloggercourse.com  Fact Sheet for Healthcare Providers:  seriousbroker.it  This test is no t yet approved or cleared by the [place]  FDA and  has been authorized for detection and/or diagnosis of SARS-CoV-2 by FDA under an Emergency Use Authorization (EUA). This EUA will remain  in effect (meaning this test can be used) for the duration of the COVID-19 declaration under Section 564(b)(1) of the Act, 21 U.S.C.section 360bbb-3(b)(1), unless the authorization is terminated  or revoked sooner.       Influenza A by PCR NEGATIVE NEGATIVE Final   Influenza B by PCR NEGATIVE NEGATIVE Final    Comment: (NOTE) The Xpert Xpress SARS-CoV-2/FLU/RSV plus assay is intended as an aid in the diagnosis of influenza from Nasopharyngeal swab specimens and should not be used as a sole basis for treatment. Nasal washings and aspirates are unacceptable for Xpert Xpress SARS-CoV-2/FLU/RSV testing.  Fact Sheet for Patients: bloggercourse.com  Fact Sheet for Healthcare Providers: seriousbroker.it  This test is not yet approved or cleared by the [place]  FDA and has been authorized for detection and/or diagnosis of SARS-CoV-2 by FDA under an Emergency Use Authorization (EUA). This EUA will remain in effect (meaning this test can be used) for the duration of the COVID-19 declaration under  Section 564(b)(1) of the Act, 21 U.S.C. section 360bbb-3(b)(1), unless the authorization is terminated or revoked.     Resp Syncytial Virus by PCR NEGATIVE NEGATIVE Final    Comment: (NOTE) Fact Sheet for Patients: bloggercourse.com  Fact Sheet for Healthcare Providers: seriousbroker.it  This test is not yet approved or cleared by the [place]  FDA and has been authorized for detection and/or diagnosis of SARS-CoV-2 by FDA under an Emergency Use Authorization (EUA). This EUA will remain in effect (meaning this test can be used) for the duration of the COVID-19 declaration  under Section 564(b)(1) of the Act, 21 U.S.C. section 360bbb-3(b)(1), unless the authorization is terminated or revoked.  Performed at [facility], [address] [place], [place] [address]     Coagulation Studies: No results for input(s): LABPROT, INR in the last 72 hours.  Imaging: MR BRAIN W WO CONTRAST Result Date: 02/01/2024 EXAM: MRI BRAIN WITH AND WITHOUT CONTRAST 02/01/2024 05:54:10 PM TECHNIQUE: Multiplanar multisequence MRI of the head/brain was performed with and without the administration of intravenous contrast. CONTRAST: 7mL gadavist  COMPARISON: None available. CLINICAL HISTORY: New-onset seizure, no history of trauma. FINDINGS: LIMITATIONS/ARTIFACTS: The examination is intermittently mildly to moderately motion degraded, most notably on the thin coronal FLAIR sequence through the temporal lobes. BRAIN AND VENTRICLES: There is no evidence of an acute infarct, intracranial hemorrhage, mass, midline shift, hydrocephalus, or extra-axial fluid collection. Cerebral volume is normal. The hippocampi are symmetric in size and signal. The brain is normal in signal. No abnormal enhancement is identified. Major intracranial vascular flow voids are preserved. ORBITS: No acute abnormality. SINUSES: Complete opacification of the left frontal sinus.  Mild bilateral ethmoid and right maxillary sinus mucosal thickening. Moderately large left maxillary sinus mucous retention cyst. Clear mastoid air cells. BONES AND SOFT TISSUES: Normal bone marrow signal and enhancement. No acute soft tissue abnormality. IMPRESSION: 1. Unremarkable appearance of the brain. Electronically signed by: Dasie Hamburg MD 02/01/2024 07:09 PM EST RP Workstation: [id number]   DG Thoracic Spine 2 View Result Date: 02/01/2024 CLINICAL DATA:  Generalized back pain.  Seizure. EXAM: THORACIC SPINE 2 VIEWS COMPARISON:  None Available. FINDINGS: There is no evidence of thoracic spine fracture. Alignment is normal. No other significant bone abnormalities are identified. IMPRESSION: Normal examination. Electronically Signed   By: Elspeth Bathe M.D.   On: 02/01/2024 11:34   DG Cervical Spine 2 or 3 views Result Date: 02/01/2024 CLINICAL DATA:  Generalized neck pain.  Seizure. EXAM: CERVICAL SPINE - 2-3 VIEW COMPARISON:  Thoracic spine radiographs obtained at the same time. FINDINGS: There is no evidence of cervical spine fracture or prevertebral soft tissue swelling. Alignment is normal. No other significant bone abnormalities are identified. IMPRESSION: Normal examination. Electronically Signed   By: Elspeth Bathe M.D.   On: 02/01/2024 11:34   DG Lumbar Spine 2-3 Views Result Date: 02/01/2024 CLINICAL DATA:  Generalized back pain.  Seizure. EXAM: LUMBAR SPINE - 2-3 VIEW COMPARISON:  Thoracic spine radiographs obtained at the same time. FINDINGS: Five non-rib-bearing lumbar vertebrae. These have normal appearances with no fractures, pars defects or subluxations. IMPRESSION: Normal examination. Electronically Signed   By: Elspeth Bathe M.D.   On: 02/01/2024 11:33    Medications: I have reviewed the patient's current medications. Scheduled:  gabapentin   300 mg Oral TID   heparin   5,000 Units Subcutaneous Q8H   sodium chloride  flush  3 mL Intravenous Q12H    Assessment/Plan: 28 y.o.  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  Also reports that on day of arrival was at work and co-worker noted what is described as a tonic clonic seizure by patient.  Patient reports that he is amnestic of the event.  Patient reports it only lasted short time and was not brought to the hospital. No bowel or bladder incontinence, no tongue biting. UDS negative.  CK>20,000.  LFTs elevated.  Neurological examination is nonfocal.  MRI of the brain personally reviewed and is normal.  Serum ammonia normal.  EEG pending   Recommendations:  Antiepileptic not indicated at this time EEG pending Agree with treatment of rhabdo   LOS: 1 day   Sonny Hock, MD Neurology  02/02/2024  9:51 AM

## 2024-02-02 NOTE — Progress Notes (Addendum)
 1      PROGRESS NOTE    Roberto Stanton  FMW:[id number] DOB: Feb 26, 1995 DOA: 01/31/2024 PCP: Patient, No Pcp Per   Brief Narrative:   28 y.o. 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   12/13: Neurology consult, MRI brain, changing pain regimen to oxycodone  10 mg and Toradol  every 6 as needed and lidocaine  patch 12/14: Patient requested to change his pain regimen to oxycodone  every 4 hours and stop Toradol  along with lidocaine    Assessment & Plan:   Principal Problem:   Non-traumatic rhabdomyolysis, recurrent Active Problems:   Transaminitis   Myophosphorylase deficiency (glycogen storage disease V, McArdle disease) (HCC)  Nontraumatic rhabdomyolysis Transaminitis   Myophosphorylase deficiency (glycogen storage disease V, McArdle disease) (HCC)  Pt with hx of rhabdo mostly likely secondary to his underlying McArdle disease. Question if he had a seizure episode or the fall earlier led triggering rhabdo.  - Continue IV fluids at 150 cc/hr.  -His CK is more than 20,000 with a baseline around 2000 per review of records.  CK continues to stay in 20K range -He had a recent admission from 12/12/2023 when he left AMA on 12/14/2023 when his CK was over 93,000.  Upon asking he shared that he had to go to work so he left the hospital but was feeling fine -He also follows with [facility] neurology with last visit in September 2025  ?Seizure - Appreciate neurology input Antiepileptics not needed at this time. EEG pending he does follow with neurology at [facility] MRI of the brain negative for any acute pathology  Chronic pain syndrome: - He gets prescriptions for oxycodone  IR 10 mg from same provider - Patient requested to change his pain regimen to oxycodone  every 4 hours and stop Toradol  along with lidocaine .-Done -Continue Robaxin  5 mg every 6 hours as needed along with gabapentin  UDS  negative  DVT prophylaxis: Heparin  subcu heparin  injection 5,000 Units Start: 01/31/24 1600     Code Status: Full code Family Communication: None at bedside Disposition Plan: Possible discharge in next 1-2 days depending on clinical condition   Consultants:  Neurology   Subjective:  He is requesting to change his pain medication back to his home regimen  Objective: Vitals:   02/01/24 2105 02/02/24 0456 02/02/24 0500 02/02/24 0850  BP: 129/82 111/74  128/73  Pulse: 90 87  68  Resp: 16 16  16   Temp: 98.4 F (36.9 C) 97.8 F (36.6 C)  98 F (36.7 C)  TempSrc: Oral Oral    SpO2: 98% 99%  99%  Weight:   74.3 kg   Height:        Intake/Output Summary (Last 24 hours) at 02/02/2024 1328 Last data filed at 02/02/2024 1019 Gross per 24 hour  Intake 2322.15 ml  Output --  Net 2322.15 ml   Filed Weights   01/31/24 2000 02/01/24 0500 02/02/24 0500  Weight: 74.9 kg 72.7 kg 74.3 kg    Examination:  General exam: Appears calm and comfortable  Respiratory system: Clear to auscultation. Respiratory effort normal. Cardiovascular system: S1 & S2 heard, RRR. No JVD, murmurs, rubs, gallops or clicks. No pedal edema. Gastrointestinal system: Abdomen is soft, benign Central nervous system: Alert and oriented. No focal neurological deficits. Extremities: Symmetric 5 x 5 power. Skin: No rashes, lesions or ulcers Psychiatry: Judgement and insight appear normal. Mood & affect appropriate.     Data Reviewed: I have  personally reviewed following labs and imaging studies  CBC: Recent Labs  Lab 01/31/24 0901 02/01/24 0430 02/02/24 0452  WBC 5.2 5.8 6.2  NEUTROABS 2.9  --   --   HGB 16.3 14.2 13.8  HCT 45.9 41.7 40.9  MCV 89.5 92.3 91.9  PLT 207 184 193   Basic Metabolic Panel: Recent Labs  Lab 01/31/24 1237 02/01/24 0430 02/02/24 0452  NA 139 138 139  K 4.0 4.1 3.9  CL 100 101 102  CO2 24 28 28   GLUCOSE 77 81 88  BUN 15 10 11   CREATININE 0.69 0.61 0.69  CALCIUM   8.6* 8.7* 8.8*  MG 2.3  --   --   PHOS 3.4  --   --    GFR: Estimated Creatinine Clearance: 136.2 mL/min (by C-G formula based on SCr of 0.69 mg/dL). Liver Function Tests: Recent Labs  Lab 01/31/24 1237 02/01/24 0430  AST 274* 427*  ALT 157* 197*  ALKPHOS 85 70  BILITOT 0.5 0.5  PROT 7.8 6.6  ALBUMIN 4.2 3.6   No results for input(s): LIPASE, AMYLASE in the last 168 hours. Recent Labs  Lab 02/01/24 1238  AMMONIA 22   Coagulation Profile: No results for input(s): INR, PROTIME in the last 168 hours. Cardiac Enzymes: Recent Labs  Lab 01/31/24 1237 02/01/24 0430 02/01/24 1008 02/02/24 0452  CKTOTAL >20,000* >20,000* >20,000* >20,000*   BNP (last 3 results) No results for input(s): PROBNP in the last 8760 hours. HbA1C: No results for input(s): HGBA1C in the last 72 hours. CBG: Recent Labs  Lab 02/01/24 0816 02/02/24 0852  GLUCAP 97 98   Lipid Profile: No results for input(s): CHOL, HDL, LDLCALC, TRIG, CHOLHDL, LDLDIRECT in the last 72 hours. Thyroid  Function Tests: Recent Labs    01/31/24 1435  TSH 0.726   Anemia Panel: No results for input(s): VITAMINB12, FOLATE, FERRITIN, TIBC, IRON, RETICCTPCT in the last 72 hours. Sepsis Labs: No results for input(s): PROCALCITON, LATICACIDVEN in the last 168 hours.  No results found for this or any previous visit (from the past 240 hours).       Radiology Studies: MR BRAIN W WO CONTRAST Result Date: 02/01/2024 EXAM: MRI BRAIN WITH AND WITHOUT CONTRAST 02/01/2024 05:54:10 PM TECHNIQUE: Multiplanar multisequence MRI of the head/brain was performed with and without the administration of intravenous contrast. CONTRAST: 7mL gadavist  COMPARISON: None available. CLINICAL HISTORY: New-onset seizure, no history of trauma. FINDINGS: LIMITATIONS/ARTIFACTS: The examination is intermittently mildly to moderately motion degraded, most notably on the thin coronal FLAIR sequence through the  temporal lobes. BRAIN AND VENTRICLES: There is no evidence of an acute infarct, intracranial hemorrhage, mass, midline shift, hydrocephalus, or extra-axial fluid collection. Cerebral volume is normal. The hippocampi are symmetric in size and signal. The brain is normal in signal. No abnormal enhancement is identified. Major intracranial vascular flow voids are preserved. ORBITS: No acute abnormality. SINUSES: Complete opacification of the left frontal sinus. Mild bilateral ethmoid and right maxillary sinus mucosal thickening. Moderately large left maxillary sinus mucous retention cyst. Clear mastoid air cells. BONES AND SOFT TISSUES: Normal bone marrow signal and enhancement. No acute soft tissue abnormality. IMPRESSION: 1. Unremarkable appearance of the brain. Electronically signed by: Dasie Hamburg MD 02/01/2024 07:09 PM EST RP Workstation: [id number]   DG Thoracic Spine 2 View Result Date: 02/01/2024 CLINICAL DATA:  Generalized back pain.  Seizure. EXAM: THORACIC SPINE 2 VIEWS COMPARISON:  None Available. FINDINGS: There is no evidence of thoracic spine fracture. Alignment is normal. No other significant bone abnormalities are  identified. IMPRESSION: Normal examination. Electronically Signed   By: Elspeth Bathe M.D.   On: 02/01/2024 11:34   DG Cervical Spine 2 or 3 views Result Date: 02/01/2024 CLINICAL DATA:  Generalized neck pain.  Seizure. EXAM: CERVICAL SPINE - 2-3 VIEW COMPARISON:  Thoracic spine radiographs obtained at the same time. FINDINGS: There is no evidence of cervical spine fracture or prevertebral soft tissue swelling. Alignment is normal. No other significant bone abnormalities are identified. IMPRESSION: Normal examination. Electronically Signed   By: Elspeth Bathe M.D.   On: 02/01/2024 11:34   DG Lumbar Spine 2-3 Views Result Date: 02/01/2024 CLINICAL DATA:  Generalized back pain.  Seizure. EXAM: LUMBAR SPINE - 2-3 VIEW COMPARISON:  Thoracic spine radiographs obtained at the same time.  FINDINGS: Five non-rib-bearing lumbar vertebrae. These have normal appearances with no fractures, pars defects or subluxations. IMPRESSION: Normal examination. Electronically Signed   By: Elspeth Bathe M.D.   On: 02/01/2024 11:33        Scheduled Meds:  gabapentin   300 mg Oral TID   heparin   5,000 Units Subcutaneous Q8H   sodium chloride  flush  3 mL Intravenous Q12H   Continuous Infusions:  lactated ringers  150 mL/hr at 02/02/24 1312     LOS: 1 day    Time spent: 35 minutes    Dezeray Puccio Maree, MD [facility] Pager [phone]  If 7PM-7AM, please contact night-coverage  02/02/2024, 1:28 PM

## 2024-02-02 NOTE — Plan of Care (Signed)

## 2024-02-02 NOTE — Plan of Care (Signed)
  Problem: Clinical Measurements: Goal: Ability to maintain clinical measurements within normal limits will improve Outcome: Progressing   Problem: Pain Managment: Goal: General experience of comfort will improve and/or be controlled Outcome: Progressing   Problem: Safety: Goal: Ability to remain free from injury will improve Outcome: Progressing

## 2024-02-03 ENCOUNTER — Inpatient Hospital Stay

## 2024-02-03 LAB — GLUCOSE, CAPILLARY: Glucose-Capillary: 92 mg/dL (ref 70–99)

## 2024-02-03 LAB — CK: Total CK: >20000 U/L — ABNORMAL HIGH (ref 49–397)

## 2024-02-03 NOTE — Plan of Care (Signed)

## 2024-02-04 NOTE — Discharge Summary (Signed)
 Physician Discharge Summary   Patient: Roberto Stanton MRN: [id number] DOB: January 22, 1996  Admit date:     01/31/2024  Discharge date: 02/03/2024  Discharge Physician: Cresencio Fairly   PCP: Autry Grayce LABOR, PA   Recommendations at discharge:    F/up with outpt providers as requested  Discharge Diagnoses: Principal Problem:   Non-traumatic rhabdomyolysis, recurrent Active Problems:   Transaminitis   Myophosphorylase deficiency (glycogen storage disease V, McArdle disease) [facility])  Hospital Course: Assessment and Plan:  28 y.o. 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    12/13: Neurology consult, MRI brain, changing pain regimen to oxycodone  10 mg and Toradol  every 6 as needed and lidocaine  patch 12/14: Patient requested to change his pain regimen to oxycodone  every 4 hours and stop Toradol  along with lidocaine    Nontraumatic rhabdomyolysis Transaminitis   Myophosphorylase deficiency (glycogen storage disease V, McArdle disease) (HCC)  Pt with hx of rhabdo mostly likely secondary to his underlying McArdle disease. Question if he had a seizure episode or the fall earlier led triggering rhabdo.  - Hydrated aggressively with IV fluids at 150 cc/hr during his entire inpatient stay without much change in his CK -His CK is more than 20,000 - CK continues to stay > 20K range despite aggressive hydration. Likely due to his McArdle dz as he's asymptomatic and ambulating without any issues -He had a recent admission from 12/12/2023 when he left AMA on 12/14/2023 when his CK was over 93,000.  Upon asking he shared that he had to go to work so he left the hospital but was feeling fine -He also follows with [facility] neurology with last visit in September 2025 - He's requesting DC today as he's back to baseline. He's agreeable with the DC plans and have a close f/up with PCP & neuro    ?Seizure - Seen by neurology i Antiepileptics not needed at this time. EEG pending on the day of DC but he is no longer wanting to wait, he does follow with neurology at [facility] and is agreeable to see his outpt neuro soon MRI of the brain negative for any acute pathology Ammonia normal   Chronic pain syndrome: - continue home regimen UDS negative      Consultants: Neuro  Disposition: Home Diet recommendation:  Carb modified diet DISCHARGE MEDICATION: Allergies as of 02/03/2024       Reactions   Acetaminophen  Nausea Only   Morphine  Hives        Medication List     STOP taking these medications    cyanocobalamin  500 MCG tablet Commonly known as: VITAMIN B12   naloxone  4 MG/0.1ML Liqd nasal spray kit Commonly known as: NARCAN    Vitamin D  (Ergocalciferol ) 1.25 MG (50000 UNIT) Caps capsule Commonly known as: DRISDOL        TAKE these medications    gabapentin  300 MG capsule Commonly known as: NEURONTIN  Take 300 mg by mouth 3 (three) times daily.   Oxycodone  HCl 10 MG Tabs Take 10 mg by mouth 4 (four) times daily as needed (for pain).        Follow-up Information     Mehrabyan, Anahit Cheluskin, MD. Schedule an appointment as soon as possible for a visit in 1 week(s).   Specialties: Neurology, Radiology Why: [facility] Discharge F/UP Contact information: [address] [place] [address] [phone]         Autry Grayce LABOR,  PA. Go on 02/10/2024.   Specialty: Physician Assistant Why: [facility] Discharge F/UP. Go at 1:00pm. Contact [address]HA [address] [place] [address] [phone]                Discharge Exam: Filed Weights   01/31/24 2000 02/01/24 0500 02/02/24 0500  Weight: 74.9 kg 72.7 kg 74.3 kg   General exam: Appears calm and comfortable  Respiratory system: Clear to auscultation. Respiratory effort normal. Cardiovascular system: S1 & S2 heard, RRR. No JVD, murmurs, rubs, gallops or  clicks. No pedal edema. Gastrointestinal system: Abdomen is soft, benign Central nervous system: Alert and oriented. No focal neurological deficits. Extremities: Symmetric 5 x 5 power. Skin: No rashes, lesions or ulcers Psychiatry: Judgement and insight appear normal. Mood & affect appropriate.   Condition at discharge: fair  The results of significant diagnostics from this hospitalization (including imaging, microbiology, ancillary and laboratory) are listed below for reference.   Imaging Studies: MR BRAIN W WO CONTRAST Result Date: 02/01/2024 EXAM: MRI BRAIN WITH AND WITHOUT CONTRAST 02/01/2024 05:54:10 PM TECHNIQUE: Multiplanar multisequence MRI of the head/brain was performed with and without the administration of intravenous contrast. CONTRAST: 7mL gadavist  COMPARISON: None available. CLINICAL HISTORY: New-onset seizure, no history of trauma. FINDINGS: LIMITATIONS/ARTIFACTS: The examination is intermittently mildly to moderately motion degraded, most notably on the thin coronal FLAIR sequence through the temporal lobes. BRAIN AND VENTRICLES: There is no evidence of an acute infarct, intracranial hemorrhage, mass, midline shift, hydrocephalus, or extra-axial fluid collection. Cerebral volume is normal. The hippocampi are symmetric in size and signal. The brain is normal in signal. No abnormal enhancement is identified. Major intracranial vascular flow voids are preserved. ORBITS: No acute abnormality. SINUSES: Complete opacification of the left frontal sinus. Mild bilateral ethmoid and right maxillary sinus mucosal thickening. Moderately large left maxillary sinus mucous retention cyst. Clear mastoid air cells. BONES AND SOFT TISSUES: Normal bone marrow signal and enhancement. No acute soft tissue abnormality. IMPRESSION: 1. Unremarkable appearance of the brain. Electronically signed by: Dasie Hamburg MD 02/01/2024 07:09 PM EST RP Workstation: [id number]   DG Thoracic Spine 2 View Result Date:  02/01/2024 CLINICAL DATA:  Generalized back pain.  Seizure. EXAM: THORACIC SPINE 2 VIEWS COMPARISON:  None Available. FINDINGS: There is no evidence of thoracic spine fracture. Alignment is normal. No other significant bone abnormalities are identified. IMPRESSION: Normal examination. Electronically Signed   By: Elspeth Bathe M.D.   On: 02/01/2024 11:34   DG Cervical Spine 2 or 3 views Result Date: 02/01/2024 CLINICAL DATA:  Generalized neck pain.  Seizure. EXAM: CERVICAL SPINE - 2-3 VIEW COMPARISON:  Thoracic spine radiographs obtained at the same time. FINDINGS: There is no evidence of cervical spine fracture or prevertebral soft tissue swelling. Alignment is normal. No other significant bone abnormalities are identified. IMPRESSION: Normal examination. Electronically Signed   By: Elspeth Bathe M.D.   On: 02/01/2024 11:34   DG Lumbar Spine 2-3 Views Result Date: 02/01/2024 CLINICAL DATA:  Generalized back pain.  Seizure. EXAM: LUMBAR SPINE - 2-3 VIEW COMPARISON:  Thoracic spine radiographs obtained at the same time. FINDINGS: Five non-rib-bearing lumbar vertebrae. These have normal appearances with no fractures, pars defects or subluxations. IMPRESSION: Normal examination. Electronically Signed   By: Elspeth Bathe M.D.   On: 02/01/2024 11:33   DG Chest 1 View Result Date: 01/31/2024 CLINICAL DATA:  Chest pain. EXAM: DG CHEST 1V COMPARISON:  12/12/2022. FINDINGS: The heart size and mediastinal contours are within normal limits. No focal consolidation, pleural  effusion, or pneumothorax. No acute osseous abnormality. IMPRESSION: No acute cardiopulmonary findings. Electronically Signed   By: Harrietta Sherry M.D.   On: 01/31/2024 09:34    Microbiology: Results for orders placed or performed during the hospital encounter of 01/18/24  Group A Strep by PCR if patient complains of sore throat.     Status: None   Collection Time: 01/18/24 11:51 AM   Specimen: Throat; Sterile Swab  Result Value Ref Range  Status   Group A Strep by PCR NOT DETECTED NOT DETECTED Final    Comment: Performed at [facility], [address] [place], [place] [address]  Resp panel by RT-PCR (RSV, Flu A&B, Covid) Anterior Nasal Swab     Status: None   Collection Time: 01/18/24 12:23 PM   Specimen: Anterior Nasal Swab  Result Value Ref Range Status   SARS Coronavirus 2 by RT PCR NEGATIVE NEGATIVE Final    Comment: (NOTE) SARS-CoV-2 target nucleic acids are NOT DETECTED.  The SARS-CoV-2 RNA is generally detectable in upper respiratory specimens during the acute phase of infection. The lowest concentration of SARS-CoV-2 viral copies this assay can detect is 138 copies/mL. A negative result does not preclude SARS-Cov-2 infection and should not be used as the sole basis for treatment or other patient management decisions. A negative result may occur with  improper specimen collection/handling, submission of specimen other than nasopharyngeal swab, presence of viral mutation(s) within the areas targeted by this assay, and inadequate number of viral copies(<138 copies/mL). A negative result must be combined with clinical observations, patient history, and epidemiological information. The expected result is Negative.  Fact Sheet for Patients:  bloggercourse.com  Fact Sheet for Healthcare Providers:  seriousbroker.it  This test is no t yet approved or cleared by the [place]  FDA and  has been authorized for detection and/or diagnosis of SARS-CoV-2 by FDA under an Emergency Use Authorization (EUA). This EUA will remain  in effect (meaning this test can be used) for the duration of the COVID-19 declaration under Section 564(b)(1) of the Act, 21 U.S.C.section 360bbb-3(b)(1), unless the authorization is terminated  or revoked sooner.       Influenza A by PCR NEGATIVE NEGATIVE Final   Influenza B by PCR NEGATIVE NEGATIVE Final    Comment: (NOTE) The  Xpert Xpress SARS-CoV-2/FLU/RSV plus assay is intended as an aid in the diagnosis of influenza from Nasopharyngeal swab specimens and should not be used as a sole basis for treatment. Nasal washings and aspirates are unacceptable for Xpert Xpress SARS-CoV-2/FLU/RSV testing.  Fact Sheet for Patients: bloggercourse.com  Fact Sheet for Healthcare Providers: seriousbroker.it  This test is not yet approved or cleared by the [place]  FDA and has been authorized for detection and/or diagnosis of SARS-CoV-2 by FDA under an Emergency Use Authorization (EUA). This EUA will remain in effect (meaning this test can be used) for the duration of the COVID-19 declaration under Section 564(b)(1) of the Act, 21 U.S.C. section 360bbb-3(b)(1), unless the authorization is terminated or revoked.     Resp Syncytial Virus by PCR NEGATIVE NEGATIVE Final    Comment: (NOTE) Fact Sheet for Patients: bloggercourse.com  Fact Sheet for Healthcare Providers: seriousbroker.it  This test is not yet approved or cleared by the [place]  FDA and has been authorized for detection and/or diagnosis of SARS-CoV-2 by FDA under an Emergency Use Authorization (EUA). This EUA will remain in effect (meaning this test can be used) for the duration of the COVID-19 declaration under Section 564(b)(1) of the  Act, 21 U.S.C. section 360bbb-3(b)(1), unless the authorization is terminated or revoked.  Performed at [facility], [address] [place], [place] [address]     Labs: CBC: Recent Labs  Lab 01/31/24 0901 02/01/24 0430 02/02/24 0452  WBC 5.2 5.8 6.2  NEUTROABS 2.9  --   --   HGB 16.3 14.2 13.8  HCT 45.9 41.7 40.9  MCV 89.5 92.3 91.9  PLT 207 184 193   Basic Metabolic Panel: Recent Labs  Lab 01/31/24 1237 02/01/24 0430 02/02/24 0452  NA 139 138 139  K 4.0 4.1 3.9  CL 100 101 102   CO2 24 28 28   GLUCOSE 77 81 88  BUN 15 10 11   CREATININE 0.69 0.61 0.69  CALCIUM  8.6* 8.7* 8.8*  MG 2.3  --   --   PHOS 3.4  --   --    Liver Function Tests: Recent Labs  Lab 01/31/24 1237 02/01/24 0430  AST 274* 427*  ALT 157* 197*  ALKPHOS 85 70  BILITOT 0.5 0.5  PROT 7.8 6.6  ALBUMIN 4.2 3.6   CBG: Recent Labs  Lab 02/01/24 0816 02/02/24 0852 02/03/24 0807  GLUCAP 97 98 92    Discharge time spent: greater than 30 minutes.  Signed: Cresencio Fairly, MD [facility] 02/04/2024

## 2024-02-27 NOTE — ED Provider Notes (Signed)
 [facility] Emergency Department Provider Note   ED Clinical Impression   Final diagnoses:  Non-traumatic rhabdomyolysis (Primary)    HPI, Medical Decision Making, ED Course   HPI: 29 y.o. male who has a past medical history of pericarditis and McArdles disease c/b recurrent episodes of non-traumatic rhabdomyolysis who presents with generalized body aches. The patient reports significant body aches, worst in his arms and back, as well as a fever and chills starting last night which he states feels similar to previous episodes of rhabdomyolysis. He states his highest temperature measured 101 F but it has since resolved after taking ibuprofen . However, he continues to endorse subjective fevers and generalized malaise upon interview. He additionally states that over his time in the ED, his urine has become dark in color, but states it was a normal color last night. No known sick contacts. Denies rhinorrhea, sore throat, cough, shortness of breath, chest pain, nausea, or emesis.   DDx/MDM:  This is a 29 year old male with past medical history of McArdle disease and recurrent nontraumatic rhabdomyolysis who presents to the emergency department with bodyaches consistent with prior episodes of rhabdomyolysis associated with a fever last night.  No other infectious symptoms including shortness of breath, cough, runny nose, chest pain, abdominal pain, nausea, vomiting, urinary complaints.  While in the waiting room, patient noted darker urine, although had not noted this prior.  CK obtained from waiting room showed 5000 which was slightly elevated but improved from prior.  Renal function and electrolytes from triage are within normal limits.  Will plan to give 2 L of fluids and recheck his CK after 2 L of fluids.  Will additionally plan to obtain urinalysis to evaluate for urinary tract infection or hematuria.  Orders Placed This Encounter  Procedures   Respiratory Pathogen Panel   XR Chest Portable    Comprehensive Metabolic Panel   CBC w/ Differential   Total CK (Creatine Kinase)   Magnesium  Level   Phosphorus Level   Total CK (Creatine Kinase)   Urinalysis with Microscopy with Culture Reflex   CBC   Comprehensive Metabolic Panel   CK   Nutrition Therapy Regular/House   Patient may shower   Vital signs   Notify Provider   Notify Provider   Measure height   Weigh patient   Flush line per protocol   Strict intake and output   Full Code   Insert peripheral IV   Saline lock IV   Place Patient in Bed   ED Admit Decision   Update patient status    ED Course as of 02/29/24 2009  Thu Feb 27, 2024  2301 Phosphorus: 3.3  2301 Magnesium : 2.6  2301 CK Total(!): 5,796.0  2301 Comprehensive Metabolic Panel(!):   Sodium 138  Potassium 3.9  Chloride 97(!)  CO2 27.9  Anion Gap 13  Bun 11  Creatinine 0.82  BUN/Creatinine Ratio 13  eGFR CKD-EPI (2021) Male >90  Glucose 98  Calcium  9.5  Albumin 4.1  Total Protein 8.7(!)  Total Bilirubin 0.4  SGOT (AST) 76(!)  ALT 68(!)  Alkaline Phosphatase 101 Renal function within normal limits, mildly elevated AST and ALT, otherwise no acute abnormalities  2301 CBC w/ Differential:   WBC 8.2  RBC 5.21  HGB 16.0  HCT 46.5  MCV 89.3  MCH 30.7  MCHC 34.3  RDW 13.0  MPV 9.5  Platelet 207  nRBC 0  Neutrophils % 68.9  Lymphocytes % 17.0  Monocytes % 10.1  Eosinophils % 3.0  Basophils %  1.0  Absolute Neutrophils 5.7  Absolute Lymphocytes 1.4  Absolute Monocytes  0.8  Absolute Eosinophils 0.3  Absolute Basophils  0.1 No acute abnormalities  Fri Feb 28, 2024  0010 CK Total(!): 34,066.0 Significantly worsened from prior, will plan to admit  0011 I have paged MAO for admission    MDM Elements Any discussion of this patient's case/presentation between myself and consultants, admitting teams, or other team members has been documented above. Imaging and other studies, if performed, that were available  during my care of the patient were independently reviewed and interpreted by me and considered in my medical decision making as documented above I have reviewed recent and relevant previous record, including: Inpatient notes - 02/03/24 Discharge Summary for review of past medical history Social Determinants that significantly affected care: N/A ____________________________________________  The case was discussed with the attending physician, who is in agreement with the above assessment and plan.    History   Outside Historian(s): I have obtained additional history/collateral from no one.  Past Medical History[1]  Past Surgical History[2]   Current Facility-Administered Medications:    acetaminophen  (TYLENOL ) tablet 650 mg, 650 mg, Oral, Q4H PRN, Gordon, Meghan P, MD   gabapentin  (NEURONTIN ) capsule 300 mg, 300 mg, Oral, TID, Gordon, Meghan P, MD, 300 mg at 02/29/24 1508   lactated Ringers  infusion, 200 mL/hr, Intravenous, Continuous, Gerrie Hussar, MD, Last Rate: 200 mL/hr at 02/29/24 1141, 200 mL/hr at 02/29/24 1141   naloxone  (NARCAN ) 1 mg/mL intranasal solution, 2 mL, Each Nare, Once PRN, Vaughn Gerard SQUIBB, MD   oxyCODONE  (ROXICODONE ) immediate release tablet 10 mg, 10 mg, Oral, Q4H PRN **OR** oxyCODONE  (ROXICODONE ) immediate release tablet 15 mg, 15 mg, Oral, Q4H PRN, Vaughn Gerard SQUIBB, MD, 15 mg at 02/29/24 1544  Allergies Opioids - morphine  analogues and Chlorhexidine  Family History Family History[3]  Social History Short Social History[4]   Physical Exam   VITAL SIGNS:    Vitals:   02/28/24 0725 02/28/24 1606 02/28/24 1946 02/29/24 0715  BP: 112/73 129/115 122/82 110/78  Pulse: 74 81  87  Resp: 16 16 18 18   Temp: 36.6 C (97.9 F) 36.6 C (97.9 F) 36.8 C (98.2 F) 36.6 C (97.9 F)  TempSrc: Oral Oral Oral Oral  SpO2: 98% 99% 99% 98%  Weight:      Height:        Constitutional: Alert and oriented.  Appears mildly uncomfortable. Eyes: Conjunctivae are  normal. Neck: Supple, no meningismus.  HEENT: Normocephalic and atraumatic. Conjunctivae clear. No congestion. Moist mucous membranes.  Cardiovascular: Rate as above, regular rhythm. Normal and symmetric distal pulses. Brisk capillary refill. Normal skin turgor. Respiratory: Normal respiratory effort. Breath sounds are normal. There are no wheezing or crackles heard. Gastrointestinal: Soft, non-distended, non-tender. Genitourinary: Deferred. Musculoskeletal: Non-tender with normal range of motion in all extremities. No lower extremity edema.  Neurologic: Normal speech and language. No gross focal neurologic deficits are appreciated. Patient is moving all extremities equally, face is symmetric at rest and with speech. Skin: Skin is warm, dry and intact. No rash noted. Psychiatric: Mood and affect are normal. Speech and behavior are normal.   Radiology   XR Chest Portable  Final Result    No acute abnormalities.           Laboratory Results   Lab Results  Component Value Date   WBC 4.6 02/29/2024   HGB 14.4 02/29/2024   HCT 41.6 02/29/2024   PLT 178 02/29/2024    Lab Results  Component  Value Date   NA 141 02/29/2024   K 3.7 02/29/2024   CL 98 02/29/2024   CO2 28.0 02/29/2024   BUN 7 (L) 02/29/2024   CREATININE 0.61 (L) 02/29/2024   GLU 93 02/29/2024   CALCIUM  9.4 02/29/2024   MG 2.6 02/27/2024   PHOS 3.3 02/27/2024    Lab Results  Component Value Date   BILITOT 0.5 02/29/2024   BILIDIR 0.10 06/05/2022   PROT 7.5 02/29/2024   ALBUMIN 3.5 02/29/2024   ALT 158 (H) 02/29/2024   AST 249 (H) 02/29/2024   ALKPHOS 84 02/29/2024   GGT 15 12/12/2023    Lab Results  Component Value Date   INR 1.31 02/26/2023    Pertinent labs & imaging results that were available during my care of the patient were independently interpreted by me and considered in my medical decision making (see chart for details).  Portions of this record have been created using [employer]. Dictation errors have been sought, but may not have been identified and corrected.  Documentation assistance was provided by Brad Cunning, Scribe on February 27, 2024 at 10:29 PM for Royce Nyhan, MD.  Documentation assistance was provided by the scribe in my presence.  The documentation recorded by the scribe has been reviewed by me and accurately reflects the services I personally performed.       [1] Past Medical History: Diagnosis Date   Kidney stone 2019   McArdle disease    (CMS-HCC) 10/2020   Pericarditis (HHS-HCC) 05/2019  [2] Past Surgical History: Procedure Laterality Date   PR DEEP MUSCLE BIOPSY Left 11/08/2020   Procedure: MUSCLE BIOPSY OF LEFT VASTUS LATERALIS;  Surgeon: Macario Jenkins Power, MD;  Location: MAIN OR [facility];  Service: Plastics  [3] Family History Problem Relation Age of Onset   Heart disease Father   [4] Social History Tobacco Use   Smoking status: Former    Current packs/day: 0.00    Average packs/day: 0.5 packs/day    Types: Cigarettes, e-Cigarettes    Quit date: 2019    Years since quitting: 7.0   Smokeless tobacco: Never   Tobacco comments:    Pt refused couseling  Vaping Use   Vaping status: Some Days   Devices: Disposable  Substance Use Topics   Alcohol use: Not Currently   Drug use: Not Currently   Armato, Alexandra M, MD Resident 02/29/24 2009

## 2024-03-01 NOTE — Discharge Summary (Signed)
 ------------------------------------------------------------------------------- Attestation signed by Katherleen Fairy Bring, MD at 03/02/24 2103 Discharge Attestation: I saw and evaluated the patient, participating in the key portions of the service on the day of discharge.  I reviewed the resident's note and agree with the discharge plans and disposition. I personally spent 35 minutes in discharge planning services.    FAIRY BRING KATHERLEEN, MD      -------------------------------------------------------------------------------   Physician Discharge Summary [facility] [address] [place] [address] Dept: [phone] Loc: [phone]   Identifying Information:  Roberto Stanton [birth year] [id number]  Primary Care Physician: Gladis Elsie, MD  Code Status: Full Code  Admit Date: 02/27/2024  Discharge Date: 03/02/2024   Discharge To: Home  Discharge Service: [facility]   Discharge Attending Physician: Fairy Bring Katherleen, MD  Discharge Diagnoses: Principal Problem:   Non-traumatic rhabdomyolysis Active Problems:   Transaminitis   Hepatic cyst   Glycogen storage disease type 5    (CMS-HCC)   Chronic, continuous use of opioids   Elevated CK   Outpatient Provider Follow Up Issues:  N/a   Hospital Course:  Active Problems # Rhabdomyolysis # McArdle disease Patient presented to ED with one day history of diffuse body aches and tea-colored urine similar to prior episodes of McArdle disease flares. Patient unsure of trigger for current flare, though he does report fever and chills that started the night before presentation. RPP negative, CXR unremarkable. LFTs uptrended after admission, as did CK 5,796 > 34,066 > 40,480 > 25,443 > 13075> 6871. LFTs remain elevated but downtrending. Continued on maintenance fluids while trending CK.   Chronic Problems  # Chronic pain syndrome: PDMP reviewed, fills consistent prescriptions for oxycodone  IR 10 mg  from same provider.  Continued on home oxycodone  10 mg - 15 mg q4h prn for pain as well as home gabapentin  300 mg TID.   # History of possible seizure Per chart review, patient recently hospitalized at OSH in December for McArdle's flare, triggered by possible seizure vs fall prior to admission. He was evaluated by neurology who determined that anti-epileptics not indicated at the time of evaluation. MRI brain negative for acute pathology. EEG was pending on day of discharge and unable to view result in Care Everywhere. Patient denies recent seizure activity. No concerns for seizure like activity during admission.   Outpatient PCP appointment:  Tex Bevels, MD Family Medicine Appointment:03/03/2024  3:00 PM   Touchbase with Outpatient Provider: Warm Handoff: Completed on 03/02/2024 by Hubert Conger, MD  (Intern) via Epic Secure Chat  Procedures: None No admission procedures for hospital encounter. ______________________________________________________________________ Discharge Medications:   Your Medication List     CONTINUE taking these medications    acetaminophen  325 MG tablet Commonly known as: TYLENOL  Take 2 tablets (650 mg total) by mouth every six (6) hours.   gabapentin  300 MG capsule Commonly known as: NEURONTIN  Take 1 capsule (300 mg total) by mouth Three (3) times a day.   NARCAN  4 mg/actuation nasal spray Generic drug: naloxone  1 spray. One spray in either nostril once for known/suspected opioid overdose. May repeat every 2-3 minutes in alternating nostril til EMS arrives   oxyCODONE  10 mg immediate release tablet Commonly known as: ROXICODONE  Take 1 tablet (10 mg total) by mouth every six (6) hours as needed for pain.        Allergies: Opioids - morphine  analogues and Chlorhexidine ______________________________________________________________________ Pending Test Results (if blank, then none):   Most Recent Labs: All lab results last 24  hours -   Recent Results (from the past 24 hours)  ECG 12 Lead   Collection Time: 03/02/24  1:46 AM  Result Value Ref Range   EKG Systolic BP  mmHg   EKG Diastolic BP  mmHg   EKG Ventricular Rate 99 BPM   EKG Atrial Rate 99 BPM   EKG P-R Interval 146 ms   EKG QRS Duration 88 ms   EKG Q-T Interval 332 ms   EKG QTC Calculation 426 ms   EKG Calculated P Axis 60 degrees   EKG Calculated R Axis 64 degrees   EKG Calculated T Axis 55 degrees   QTC Fredericia 392 ms  POCT Glucose   Collection Time: 03/02/24  2:02 AM  Result Value Ref Range   Glucose, POC 96 70 - 179 mg/dL  Comprehensive Metabolic Panel   Collection Time: 03/02/24  7:39 AM  Result Value Ref Range   Sodium 141 135 - 145 mmol/L   Potassium 3.9 3.4 - 4.8 mmol/L   Chloride 97 (L) 98 - 107 mmol/L   CO2 30.1 20.0 - 31.0 mmol/L   Anion Gap 14 5 - 14 mmol/L   BUN 7 (L) 9 - 23 mg/dL   Creatinine 9.27 (L) 9.26 - 1.18 mg/dL   BUN/Creatinine Ratio 10    eGFR CKD-EPI (2021) Male >90 >=60 mL/min/1.30m2   Glucose 90 70 - 179 mg/dL   Calcium  9.4 8.7 - 10.4 mg/dL   Albumin 3.5 3.4 - 5.0 g/dL   Total Protein 7.6 5.7 - 8.2 g/dL   Total Bilirubin 0.5 0.3 - 1.2 mg/dL   AST 889 (H) <=65 U/L   ALT 128 (H) 10 - 49 U/L   Alkaline Phosphatase 81 46 - 116 U/L  CK   Collection Time: 03/02/24  7:39 AM  Result Value Ref Range   Creatine Kinase, Total 6,871.0 (H) 46.0 - 171.0 U/L  Toxicology Screen, Urine   Collection Time: 03/02/24 10:48 AM  Result Value Ref Range   Amphetamines Screen, Ur Negative <500 ng/mL   Barbiturates Screen, Ur Negative <200 ng/mL   Benzodiazepines Screen, Urine Negative <200 ng/mL   Cannabinoids Screen, Ur Negative <20 ng/mL   Methadone Screen, Urine Negative <300 ng/mL   Cocaine(Metab.)Screen, Urine Negative <150 ng/mL   Opiates Screen, Ur Positive (A) <300 ng/mL   Fentanyl  Screen, Ur Negative <1.0 ng/mL   Oxycodone  Screen, Ur Positive (A) <100 ng/mL   Buprenorphine, Urine Negative <5 ng/mL    Relevant  Studies/Radiology (if blank, then none): ECG 12 Lead Result Date: 03/02/2024 NORMAL SINUS RHYTHM NORMAL ECG WHEN COMPARED WITH ECG OF 20-Mar-2023 00:48, NO SIGNIFICANT CHANGE WAS FOUND  XR Chest Portable Result Date: 02/28/2024 EXAM: XR CHEST PORTABLE ACCESSION: [id number] UN REPORT DATE: 02/28/2024 8:06 AM CLINICAL INDICATION: FEVER  TECHNIQUE: Single View AP Chest Radiograph. COMPARISON: XR CHEST PORTABLE 03/20/2023, XR CHEST 2 VIEWS 02/26/2023 FINDINGS: Lungs are clear.  No pleural effusion or pneumothorax. Normal heart size and mediastinal contours.   No acute abnormalities.   ______________________________________________________________________ Discharge Instructions:  Activity Instructions     Activity as tolerated           Other Instructions     Discharge instructions     You were admitted to [facility] for treatment of rhabdomyolysis. You feel better and your labs are improving, but we would recommend you not participate in strenuous activities while you are recovering.  Please carefully read and follow these instructions below upon your discharge:  1) Please  take your medications as prescribed and note the changes listed on your discharge. At future follow-up appointments, please be sure to take all of your medications with you so your provider can better guide your care.   2) Seek medical care with your primary care doctor or local Emergency Room or Urgent Care if you develop any changes in your mental status, worsening abdominal pain, fevers greater than 101.5, any unexplained/unrelieved shortness of breath, uncontrolled nausea and vomiting that keeps you from remaining hydrated or taking your medication, or any other concerning symptoms.   3) Please go to your follow-up appointments. Some of your follow-up appointments have been listed below. If you do not see an appointment listed below with your primary care doctor, please call your doctor's  office as soon as possible to schedule an appointment to be seen within 7-10 days of discharge.   4) If you have any concerns before you are able to follow-up with your primary care doctor, you can reach us  by calling [phone].          Follow Up instructions and Outpatient Referrals    Discharge instructions       Appointments which have been scheduled for you    Mar 03, 2024 3:00 PM Appointment with Bryn Register, MD [address], [address], [address] [place], [place] [address]   Hs[facility] follow up   Nov 02, 2024 11:00 AM (Arrive by 10:35 AM) RETURN NEUROMUSCULAR with Anahit Cheluskin Mehrabyan, MD [facility] [address] [place] [facility]) [address] [place] [address] [phone]        ______________________________________________________________________ Discharge Day Services: BP 114/86   Pulse 71   Temp 36.2 C (97.1 F)   Resp 19   Ht 177.8 cm (5' 10)   Wt 68.7 kg (151 lb 6.4 oz)   SpO2 97%   BMI 21.72 kg/m  Pt seen on the day of discharge and determined appropriate for discharge. Overnight, the patient had episode of panic, denied hydroxyzine, self resolved- patient is currently ready to go home.   GEN: well appearing, lying in bed, NAD HEENT: NCAT, MMM. Neck: Supple. CV: Regular rate and rhythm. No murmurs/rubs/gallops. Pulm: CTAB. No wheezing, crackles, or rhonchi. Abd: Flat.  Nontender. No guarding, rebound.  Normoactive bowel sounds.   Neuro: A&O x 3. No focal deficits.  Ext: No peripheral edema.  Palpable distal pulses.  Condition at Discharge: good  Length of Discharge: I spent greater than 30 mins in the discharge of this patient.

## 2024-03-16 ENCOUNTER — Other Ambulatory Visit: Payer: Self-pay

## 2024-03-16 ENCOUNTER — Emergency Department
Admission: EM | Admit: 2024-03-16 | Discharge: 2024-03-16 | Disposition: A | Discharge status: Home / Self Care | Attending: Emergency Medicine | Admitting: Emergency Medicine

## 2024-03-16 ENCOUNTER — Emergency Department

## 2024-03-16 ENCOUNTER — Encounter: Payer: Self-pay | Admitting: Emergency Medicine

## 2024-03-16 DIAGNOSIS — R1030 Lower abdominal pain, unspecified: Secondary | ICD-10-CM | POA: Diagnosis present

## 2024-03-16 DIAGNOSIS — R748 Abnormal levels of other serum enzymes: Secondary | ICD-10-CM | POA: Insufficient documentation

## 2024-03-16 LAB — URINALYSIS, ROUTINE W REFLEX MICROSCOPIC
Bilirubin Urine: NEGATIVE
Glucose, UA: NEGATIVE mg/dL
Ketones, ur: NEGATIVE mg/dL
Leukocytes,Ua: NEGATIVE
Nitrite: NEGATIVE
Protein, ur: 30 mg/dL — AB
Specific Gravity, Urine: 1.013 (ref 1.005–1.030)
Squamous Epithelial / HPF: 0 /HPF (ref 0–5)
WBC, UA: 0 WBC/hpf (ref 0–5)
pH: 6 (ref 5.0–8.0)

## 2024-03-16 LAB — CK
Total CK: 3918 U/L — ABNORMAL HIGH (ref 49–397)
Total CK: 12449 U/L — ABNORMAL HIGH (ref 49–397)

## 2024-03-16 LAB — CBC
HCT: 45.6 % (ref 39.0–52.0)
Hemoglobin: 15.9 g/dL (ref 13.0–17.0)
MCH: 30.5 pg (ref 26.0–34.0)
MCHC: 34.9 g/dL (ref 30.0–36.0)
MCV: 87.5 fL (ref 80.0–100.0)
Platelets: 281 10*3/uL (ref 150–400)
RBC: 5.21 MIL/uL (ref 4.22–5.81)
RDW: 12.2 % (ref 11.5–15.5)
WBC: 6.3 10*3/uL (ref 4.0–10.5)
nRBC: 0 % (ref 0.0–0.2)

## 2024-03-16 LAB — COMPREHENSIVE METABOLIC PANEL WITH GFR
ALT: 89 U/L — ABNORMAL HIGH (ref 0–44)
AST: 64 U/L — ABNORMAL HIGH (ref 15–41)
Albumin: 4.7 g/dL (ref 3.5–5.0)
Alkaline Phosphatase: 92 U/L (ref 38–126)
Anion gap: 12 (ref 5–15)
BUN: 13 mg/dL (ref 6–20)
CO2: 24 mmol/L (ref 22–32)
Calcium: 9.6 mg/dL (ref 8.9–10.3)
Chloride: 99 mmol/L (ref 98–111)
Creatinine, Ser: 0.74 mg/dL (ref 0.61–1.24)
GFR, Estimated: >60 mL/min
Glucose, Bld: 99 mg/dL (ref 70–99)
Potassium: 4.1 mmol/L (ref 3.5–5.1)
Sodium: 135 mmol/L (ref 135–145)
Total Bilirubin: 0.5 mg/dL (ref 0.0–1.2)
Total Protein: 8.7 g/dL — ABNORMAL HIGH (ref 6.5–8.1)

## 2024-03-16 MED ORDER — SODIUM CHLORIDE 0.9 % IV SOLN: Freq: Once | INTRAVENOUS | Status: AC

## 2024-03-16 MED ORDER — FENTANYL CITRATE (PF) 50 MCG/ML IJ SOSY
50.0000 ug | PREFILLED_SYRINGE | Freq: Once | INTRAMUSCULAR | Status: AC
  Administered 2024-03-16: 50 ug via INTRAVENOUS
  Filled 2024-03-16: qty 1

## 2024-03-16 MED ORDER — OXYCODONE HCL 5 MG PO TABS: 5.0000 mg | ORAL_TABLET | Freq: Four times a day (QID) | ORAL | 0 refills | Status: AC | PRN

## 2024-03-16 MED ORDER — OXYCODONE HCL 5 MG PO TABS
10.0000 mg | ORAL_TABLET | Freq: Once | ORAL | Status: AC
  Administered 2024-03-16: 10 mg via ORAL
  Filled 2024-03-16: qty 2

## 2024-03-16 NOTE — ED Triage Notes (Signed)
 Presents with some pain and possible swelling to groin  denies any injury or urinary sx

## 2024-03-16 NOTE — ED Notes (Signed)
Pt ambulatory to bathroom independently with steady gait.

## 2024-03-16 NOTE — Discharge Instructions (Signed)
 Your CK is elevated as we discussed, typically we would keep you in the hospital for IV fluids for this but you reported that you would like to be discharged given that you have been through this many times before

## 2024-03-16 NOTE — ED Notes (Signed)
 Patient c/o sharp, pelvic and scrotum pain X1 week. Told in past he has hernia. Reports some nausea and swelling in scrotum

## 2024-03-16 NOTE — ED Provider Notes (Signed)
 "  [facility] Provider Note    Event Date/Time   First MD Initiated Contact with Patient 03/16/24 1030     (approximate)   History   Groin Pain   HPI  Roberto Stanton is a 29 y.o. male with a history of McArdle disease who presents with lower abdominal discomfort.  Review of records demonstrates multiple admissions for elevated CK/rhabdomyolysis.  He does have a history of chronic pain related to this for which he takes oxycodone  10 mg     Physical Exam   Triage Vital Signs: ED Triage Vitals  Encounter Vitals Group     BP 03/16/24 1023 (!) 133/95     Girls Systolic BP Percentile --      Girls Diastolic BP Percentile --      Boys Systolic BP Percentile --      Boys Diastolic BP Percentile --      Pulse Rate 03/16/24 1023 (!) 119     Resp 03/16/24 1023 16     Temp 03/16/24 1023 98.6 F (37 C)     Temp Source 03/16/24 1023 Oral     SpO2 03/16/24 1023 97 %     Weight 03/16/24 1016 74.3 kg (163 lb 12.8 oz)     Height 03/16/24 1016 1.753 m (5' 9)     Head Circumference --      Peak Flow --      Pain Score 03/16/24 1032 3     Pain Loc --      Pain Education --      Exclude from Growth Chart --     Most recent vital signs: Vitals:   03/16/24 1023 03/16/24 1330  BP: (!) 133/95 (!) 133/100  Pulse: (!) 119 (!) 107  Resp: 16 18  Temp: 98.6 F (37 C)   SpO2: 97% 96%     General: Awake, no distress.  CV:  Good peripheral perfusion.  Resp:  Normal effort.  Abd:  No distention.  Mild tenderness in the right lower quadrant left lower quadrant, no CVA tenderness Other:     ED Results / Procedures / Treatments   Labs (all labs ordered are listed, but only abnormal results are displayed) Labs Reviewed  URINALYSIS, ROUTINE W REFLEX MICROSCOPIC - Abnormal; Notable for the following components:      Result Value   Color, Urine YELLOW (*)    APPearance CLEAR (*)    Hgb urine dipstick LARGE (*)    Protein, ur 30 (*)    Bacteria, UA RARE (*)     All other components within normal limits  CK - Abnormal; Notable for the following components:   Total CK 3,918 (*)    All other components within normal limits  COMPREHENSIVE METABOLIC PANEL WITH GFR - Abnormal; Notable for the following components:   Total Protein 8.7 (*)    AST 64 (*)    ALT 89 (*)    All other components within normal limits  CK - Abnormal; Notable for the following components:   Total CK 12,449 (*)    All other components within normal limits  CBC     EKG     RADIOLOGY Ultrasound scrotum is unremarkable    PROCEDURES:  Critical Care performed:   Procedures   MEDICATIONS ORDERED IN ED: Medications  oxyCODONE  (Oxy IR/ROXICODONE ) immediate release tablet 10 mg (10 mg Oral Given 03/16/24 1041)  0.9 %  sodium chloride  infusion (0 mLs Intravenous Stopped 03/16/24 1427)  fentaNYL  (SUBLIMAZE )  injection 50 mcg (50 mcg Intravenous Given 03/16/24 1327)     IMPRESSION / MDM / ASSESSMENT AND PLAN / ED COURSE  I reviewed the triage vital signs and the nursing notes. Patient's presentation is most consistent with acute presentation with potential threat to life or bodily function.   Patient presents with lower abdominal discomfort as detailed above, differential includes epididymitis, appendicitis, elevated CK  Lab work is notable for CK of 3900, we will recheck this after IV fluids  White blood cell count is normal  Ultrasound and urinalysis did not explain his pain, will send for CT  Patient has told the CT tech that he does not want to have CT scan, I discussed with him that we do not know what is causing his pain, he feels this is related to chronic pain now.  He wants to be discharged and leave AMA.  I discussed with him that his CK had increased to nearly 13,000 but he reports he has had this in the past and has been able to manage at home with increased p.o. intake.  He understands the risks involved in leaving AMA.  He does have decisional  capacity       FINAL CLINICAL IMPRESSION(S) / ED DIAGNOSES   Final diagnoses:  Elevated CK  Lower abdominal pain     Rx / DC Orders   ED Discharge Orders          Ordered    oxyCODONE  (ROXICODONE ) 5 MG immediate release tablet  Every 6 hours PRN        03/16/24 1437             Note:  This document was prepared using Dragon voice recognition software and may include unintentional dictation errors.   Arlander Charleston, MD 03/16/24 1441  "
